# Patient Record
Sex: Female | Born: 1960
Health system: Southern US, Community
[De-identification: ages and names within clinical notes are randomized; demographics above are authoritative.]

## PROBLEM LIST (undated history)

## (undated) DIAGNOSIS — M549 Dorsalgia, unspecified: Secondary | ICD-10-CM

## (undated) DIAGNOSIS — Z5189 Encounter for other specified aftercare: Secondary | ICD-10-CM

## (undated) DIAGNOSIS — E119 Type 2 diabetes mellitus without complications: Secondary | ICD-10-CM

## (undated) DIAGNOSIS — G4730 Sleep apnea, unspecified: Secondary | ICD-10-CM

## (undated) DIAGNOSIS — M5430 Sciatica, unspecified side: Secondary | ICD-10-CM

## (undated) DIAGNOSIS — D649 Anemia, unspecified: Secondary | ICD-10-CM

## (undated) DIAGNOSIS — D126 Benign neoplasm of colon, unspecified: Secondary | ICD-10-CM

## (undated) DIAGNOSIS — I10 Essential (primary) hypertension: Secondary | ICD-10-CM

## (undated) DIAGNOSIS — K219 Gastro-esophageal reflux disease without esophagitis: Secondary | ICD-10-CM

## (undated) DIAGNOSIS — T7840XA Allergy, unspecified, initial encounter: Secondary | ICD-10-CM

## (undated) DIAGNOSIS — M1990 Unspecified osteoarthritis, unspecified site: Secondary | ICD-10-CM

## (undated) DIAGNOSIS — M2550 Pain in unspecified joint: Secondary | ICD-10-CM

## (undated) DIAGNOSIS — D696 Thrombocytopenia, unspecified: Secondary | ICD-10-CM

## (undated) DIAGNOSIS — R7303 Prediabetes: Secondary | ICD-10-CM

## (undated) DIAGNOSIS — R0600 Dyspnea, unspecified: Secondary | ICD-10-CM

## (undated) DIAGNOSIS — R0602 Shortness of breath: Secondary | ICD-10-CM

## (undated) DIAGNOSIS — C801 Malignant (primary) neoplasm, unspecified: Secondary | ICD-10-CM

## (undated) DIAGNOSIS — M069 Rheumatoid arthritis, unspecified: Secondary | ICD-10-CM

## (undated) HISTORY — PX: COLONOSCOPY: SHX174

## (undated) HISTORY — DX: Encounter for other specified aftercare: Z51.89

## (undated) HISTORY — DX: Thrombocytopenia, unspecified: D69.6

## (undated) HISTORY — DX: Prediabetes: R73.03

## (undated) HISTORY — DX: Unspecified osteoarthritis, unspecified site: M19.90

## (undated) HISTORY — PX: TUBAL LIGATION: SHX77

## (undated) HISTORY — DX: Pain in unspecified joint: M25.50

## (undated) HISTORY — DX: Anemia, unspecified: D64.9

## (undated) HISTORY — DX: Gastro-esophageal reflux disease without esophagitis: K21.9

## (undated) HISTORY — DX: Sciatica, unspecified side: M54.30

## (undated) HISTORY — DX: Malignant (primary) neoplasm, unspecified: C80.1

## (undated) HISTORY — DX: Shortness of breath: R06.02

## (undated) HISTORY — DX: Rheumatoid arthritis, unspecified: M06.9

## (undated) HISTORY — DX: Dorsalgia, unspecified: M54.9

## (undated) HISTORY — DX: Allergy, unspecified, initial encounter: T78.40XA

## (undated) HISTORY — PX: STERILIZATION: SHX533

## (undated) HISTORY — DX: Benign neoplasm of colon, unspecified: D12.6

## (undated) HISTORY — PX: POLYPECTOMY: SHX149

## (undated) HISTORY — PX: TYMPANOSTOMY TUBE PLACEMENT: SHX32

## (undated) HISTORY — DX: Type 2 diabetes mellitus without complications: E11.9

## (undated) HISTORY — DX: Sleep apnea, unspecified: G47.30

---

## 2010-08-01 ENCOUNTER — Emergency Department (HOSPITAL_COMMUNITY): Admission: EM | Admit: 2010-08-01 | Discharge: 2010-08-01 | Payer: Self-pay | Admitting: Emergency Medicine

## 2011-01-04 ENCOUNTER — Ambulatory Visit: Payer: Self-pay | Admitting: Family Medicine

## 2011-03-08 ENCOUNTER — Ambulatory Visit: Payer: Self-pay | Admitting: Family Medicine

## 2012-05-16 ENCOUNTER — Encounter (HOSPITAL_COMMUNITY): Payer: Self-pay | Admitting: Emergency Medicine

## 2012-05-16 ENCOUNTER — Emergency Department (HOSPITAL_COMMUNITY)
Admission: EM | Admit: 2012-05-16 | Discharge: 2012-05-16 | Disposition: A | Discharge status: Home / Self Care | Payer: Self-pay | Attending: Emergency Medicine | Admitting: Emergency Medicine

## 2012-05-16 DIAGNOSIS — I10 Essential (primary) hypertension: Secondary | ICD-10-CM | POA: Insufficient documentation

## 2012-05-16 DIAGNOSIS — R42 Dizziness and giddiness: Secondary | ICD-10-CM | POA: Insufficient documentation

## 2012-05-16 HISTORY — DX: Essential (primary) hypertension: I10

## 2012-05-16 MED ORDER — ONDANSETRON 8 MG PO TBDP: 8.0000 mg | ORAL_TABLET | Freq: Three times a day (TID) | ORAL | Status: AC | PRN

## 2012-05-16 MED ORDER — MECLIZINE HCL 50 MG PO TABS: 50.0000 mg | ORAL_TABLET | Freq: Three times a day (TID) | ORAL | Status: AC | PRN

## 2012-05-16 NOTE — ED Notes (Addendum)
Pt c/o of a sudden onset of dizziness this morning when she woke up. Also states she has some nausea. Denies any vomiting or diarrhea. States that both ears hurt.

## 2012-05-16 NOTE — ED Provider Notes (Signed)
History     CSN: [id number]  Arrival date & time 05/16/12  0711   First MD Initiated Contact with Patient 05/16/12 [phone]      Chief Complaint  Patient presents with  . Dizziness    (Consider location/radiation/quality/duration/timing/severity/associated sxs/prior treatment) HPI Patient complaining of dizziness since awakening this a.m.  Patient states stood up and fell into wall.  Worse with movement.  Bilateral ears feel pressure and ringing.  No visual problems or lateralized weakness.  Patient with some vertigo in past but states unable to remember details as it was about 20 years ago.  Patient with nausea when moving but no vomiting.  Patient with some cough for 2-3 days but no fever, sore throat, or rhinnorhea.  Patient with history of hypertension on same med for 8-9 years.  Patient without pmd but has gone to urgent care on [address].   Past Medical History  Diagnosis Date  . Hypertension     Past Surgical History  Procedure Date  . Cesarean section     No family history on file.  History  Substance Use Topics  . Smoking status: Never Smoker   . Smokeless tobacco: Not on file  . Alcohol Use: No    OB History    Grav Para Term Preterm Abortions TAB SAB Ect Mult Living                  Review of Systems  All other systems reviewed and are negative.    Allergies  Review of patient's allergies indicates no known allergies.  Home Medications   Current Outpatient Rx  Name Route Sig Dispense Refill  . IBUPROFEN 200 MG PO TABS Oral Take 200 mg by mouth every 6 (six) hours as needed. Pain    . LISINOPRIL-HYDROCHLOROTHIAZIDE 20-12.5 MG PO TABS Oral Take 1 tablet by mouth daily.      BP 130/83  Pulse 63  Temp 97.8 F (36.6 C) (Oral)  Resp 15  SpO2 100%  Physical Exam  Nursing note and vitals reviewed. Constitutional: She is oriented to person, place, and time. She appears well-developed and well-nourished.  HENT:  Head: Normocephalic and atraumatic.    Eyes: Conjunctivae and EOM are normal. Pupils are equal, round, and reactive to light.  Neck: Normal range of motion. Neck supple.  Cardiovascular: Normal rate, regular rhythm, normal heart sounds and intact distal pulses.   Pulmonary/Chest: Effort normal and breath sounds normal.  Abdominal: Soft. Bowel sounds are normal.  Musculoskeletal: Normal range of motion.  Neurological: She is alert and oriented to person, place, and time. She has normal strength and normal reflexes. She displays a negative Romberg sign. GCS eye subscore is 4. GCS verbal subscore is 5. GCS motor subscore is 6.  Skin: Skin is warm and dry.  Psychiatric: She has a normal mood and affect. Thought content normal.    ED Course  Procedures (including critical care time)  Labs Reviewed - No data to display No results found.   No diagnosis found.  Hall Pike maneuver performed.  Plan antivert and zofran.    MDM  Patient advised regarding precautions and reasons for return.          Hilario Quarry, MD 05/16/12 [phone]

## 2012-05-16 NOTE — Discharge Instructions (Signed)

## 2012-10-29 ENCOUNTER — Encounter: Payer: Self-pay | Admitting: Gastroenterology

## 2012-10-29 ENCOUNTER — Other Ambulatory Visit (HOSPITAL_COMMUNITY)
Admission: RE | Admit: 2012-10-29 | Discharge: 2012-10-29 | Disposition: A | Discharge status: Home / Self Care | Payer: 59 | Source: Ambulatory Visit | Attending: Family Medicine | Admitting: Family Medicine

## 2012-10-29 ENCOUNTER — Encounter: Payer: Self-pay | Admitting: Family Medicine

## 2012-10-29 ENCOUNTER — Ambulatory Visit (INDEPENDENT_AMBULATORY_CARE_PROVIDER_SITE_OTHER): Payer: 59 | Admitting: Family Medicine

## 2012-10-29 ENCOUNTER — Ambulatory Visit (INDEPENDENT_AMBULATORY_CARE_PROVIDER_SITE_OTHER)
Admission: RE | Admit: 2012-10-29 | Discharge: 2012-10-29 | Disposition: A | Discharge status: Home / Self Care | Payer: 59 | Source: Ambulatory Visit | Attending: Family Medicine | Admitting: Family Medicine

## 2012-10-29 VITALS — BP 150/90 | HR 60 | Temp 98.3°F | Ht 63.75 in | Wt 184.0 lb

## 2012-10-29 DIAGNOSIS — M5410 Radiculopathy, site unspecified: Secondary | ICD-10-CM

## 2012-10-29 DIAGNOSIS — I10 Essential (primary) hypertension: Secondary | ICD-10-CM

## 2012-10-29 DIAGNOSIS — IMO0002 Reserved for concepts with insufficient information to code with codable children: Secondary | ICD-10-CM

## 2012-10-29 DIAGNOSIS — Z136 Encounter for screening for cardiovascular disorders: Secondary | ICD-10-CM

## 2012-10-29 DIAGNOSIS — Z01419 Encounter for gynecological examination (general) (routine) without abnormal findings: Secondary | ICD-10-CM | POA: Insufficient documentation

## 2012-10-29 DIAGNOSIS — Z113 Encounter for screening for infections with a predominantly sexual mode of transmission: Secondary | ICD-10-CM

## 2012-10-29 DIAGNOSIS — Z0000 Encounter for general adult medical examination without abnormal findings: Secondary | ICD-10-CM

## 2012-10-29 DIAGNOSIS — Z1151 Encounter for screening for human papillomavirus (HPV): Secondary | ICD-10-CM | POA: Insufficient documentation

## 2012-10-29 DIAGNOSIS — Z1211 Encounter for screening for malignant neoplasm of colon: Secondary | ICD-10-CM

## 2012-10-29 DIAGNOSIS — Z1231 Encounter for screening mammogram for malignant neoplasm of breast: Secondary | ICD-10-CM

## 2012-10-29 LAB — COMPREHENSIVE METABOLIC PANEL
Albumin: 4.1 g/dL (ref 3.5–5.2)
BUN: 14 mg/dL (ref 6–23)
CO2: 28 mEq/L (ref 19–32)
Calcium: 8.9 mg/dL (ref 8.4–10.5)
Chloride: 101 mEq/L (ref 96–112)
Creatinine, Ser: 0.7 mg/dL (ref 0.4–1.2)
GFR: 105.57 mL/min (ref >60.00)
Potassium: 3.6 mEq/L (ref 3.5–5.1)

## 2012-10-29 LAB — LIPID PANEL
HDL: 73.8 mg/dL (ref >39.00)
Triglycerides: 61 mg/dL (ref 0.0–149.0)

## 2012-10-29 MED ORDER — LISINOPRIL-HYDROCHLOROTHIAZIDE 20-12.5 MG PO TABS: 1.0000 | ORAL_TABLET | Freq: Every day | ORAL | Status: DC

## 2012-10-29 MED ORDER — CYCLOBENZAPRINE HCL 10 MG PO TABS: 5.0000 mg | ORAL_TABLET | Freq: Every evening | ORAL | Status: DC | PRN

## 2012-10-29 NOTE — Progress Notes (Signed)
Subjective:    Patient ID: Rachel Rivas, female    DOB: [birth year], 51 y.o.   MRN: [id number]  HPI  51 yo G2P2 here to establish care and for CPX.  HTN- was on lisinopril- HCTZ for years.  Strong family history of HTN. Stopped taking it on her own a few months ago.  BP elevated today.  Denies any HA, blurred vision, CP or SOB. No LE edema.  Back pain- over one year of worsening low back pain, typically right >left with right LE radiculopathy.  Once it was so severe, she felt like her right foot was heavy.  She has not had that sensation in awhile.  No known trauma or injury. 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 Has tried heating pad and Ibuprofen off and on.  Has not had a pap smear in over 3 years.  No h/o abnormal pap smears.  Mom died of uterine CA. She has not had a period in over a year- denies any post menopausal bleeding. Overdue for mammogram. Has never had a colonoscopy.  Patient Active Problem List  Diagnosis  . Hypertension  . Routine general medical examination at a health care facility  . Radicular low back pain   Past Medical History  Diagnosis Date  . Hypertension    Past Surgical History  Procedure Date  . Cesarean section    History  Substance Use Topics  . Smoking status: Former Games developer  . Smokeless tobacco: Not on file  . Alcohol Use: No   Family History  Problem Relation Age of Onset  . Cancer Mother   . Hypertension Father   . Diabetes Father    No Known Allergies Current Outpatient Prescriptions on File Prior to Visit  Medication Sig Dispense Refill  . ibuprofen (ADVIL,MOTRIN) 200 MG tablet Take 200 mg by mouth every 6 (six) hours as needed. Pain       The PMH, PSH, Social History, Family History, Medications, and allergies have been reviewed in [facility], and have been updated if relevant.    Review of Systems See HPI Patient reports no  vision/ hearing changes,anorexia, weight change, fever ,adenopathy, persistant  / recurrent hoarseness, swallowing issues, chest pain, edema,persistant / recurrent cough, hemoptysis, dyspnea(rest, exertional, paroxysmal nocturnal), gastrointestinal  bleeding (melena, rectal bleeding), abdominal pain, excessive heart burn, GU symptoms(dysuria, hematuria, pyuria, voiding/incontinence  Issues) syncope, focal weakness, severe memory loss, concerning skin lesions, depression, anxiety, abnormal bruising/bleeding, major joint swelling, breast masses or abnormal vaginal bleeding.       Objective:   Physical Exam BP 150/90  Pulse 60  Temp 98.3 F (36.8 C)  Ht 5' 3.75" (1.619 m)  Wt 184 lb (83.462 kg)  BMI 31.83 kg/m2  General:  Well-developed,well-nourished,in no acute distress; alert,appropriate and cooperative throughout examination Head:  normocephalic and atraumatic.   Eyes:  vision grossly intact, pupils equal, pupils round, and pupils reactive to light.   Ears:  R ear normal and L ear normal.   Nose:  no external deformity.   Mouth:  good dentition.   Neck:  No deformities, masses, or tenderness noted. Breasts:  No mass, nodules, thickening, tenderness, bulging, retraction, inflamation, nipple discharge or skin changes noted.   Lungs:  Normal respiratory effort, chest expands symmetrically. Lungs are clear to auscultation, no crackles or wheezes. Heart:  Normal rate and regular rhythm. S1 and S2 normal without gallop, murmur, click, rub or other extra sounds. Abdomen:  Bowel sounds positive,abdomen soft and non-tender without masses,  organomegaly or hernias noted. Rectal:  no external abnormalities.   Genitalia:  Pelvic Exam:        External: normal female genitalia without lesions or masses        Vagina: normal without lesions or masses        Cervix: normal without lesions or masses        Adnexa: normal bimanual exam without masses or fullness        Uterus: normal by palpation        Pap smear: performed Msk:   TTP over right lower lumbar paraspinous muscles,  no tenderness over spine. Pos SLR right, neg left. Neg fabers bilaterally Normal strength bilaterally Extremities:  No clubbing, cyanosis, edema, or deformity noted with normal full range of motion of all joints.   Neurologic:  alert & oriented X3 and gait normal.   Skin:  Intact without suspicious lesions or rashes Cervical Nodes:  No lymphadenopathy noted Axillary Nodes:  No palpable lymphadenopathy Psych:  Cognition and judgment appear intact. Alert and cooperative with normal attention span and concentration. No apparent delusions, illusions, hallucinations    Assessment & Plan:   1. HTN Deteriorated.  Restart Lisinopril-HCTZ.  Check CMET. Call me next week with BP readings.     2. Screening for colon cancer  Refer to GI for screening colonoscopy. Ambulatory referral to Gastroenterology  3. Screening for ischemic heart disease  Lipid Panel  4. Routine general medical examination at a health care facility  Reviewed preventive care protocols, scheduled due services, and updated immunizations Discussed nutrition, exercise, diet, and healthy lifestyle.  Comprehensive metabolic panel, Cytology - PAP Mammogram  5. Radicular low back pain  Deteriorated with long duration of symptoms and with radiculopathy. I am concerned for sciatica and given her ? Foot drop at one point- needs imaging.  Start with xray of lumbar spine and will likely proceed with MRI. Flexeril as needed at night, Alleve and or tylenol for pain. DG Lumbar Spine Complete  6. Screening for STD (sexually transmitted disease)  Cytology - PAP

## 2012-10-29 NOTE — Addendum Note (Signed)
Addended by: Dianne Dun on: 10/29/2012 01:09 PM   Modules accepted: Orders

## 2012-10-29 NOTE — Patient Instructions (Addendum)
It was so nice to meet you.  Please stop by to see Shirlee Limerick on your way out to set up your mammogram and colonoscopy.  Aleve and/or tylenol as needed for pain.  We will call you with your xray results and lab work from today.  We may consider MRI or physical therapy (or both) if your back pain continues.   Please call me next week with your blood pressure readings.

## 2012-11-26 ENCOUNTER — Ambulatory Visit
Admission: RE | Admit: 2012-11-26 | Discharge: 2012-11-26 | Disposition: A | Discharge status: Home / Self Care | Payer: 59 | Source: Ambulatory Visit | Attending: Family Medicine | Admitting: Family Medicine

## 2012-11-26 DIAGNOSIS — Z1231 Encounter for screening mammogram for malignant neoplasm of breast: Secondary | ICD-10-CM

## 2012-12-03 ENCOUNTER — Ambulatory Visit: Payer: 59

## 2012-12-07 ENCOUNTER — Encounter: Payer: 59 | Admitting: Gastroenterology

## 2013-03-21 ENCOUNTER — Encounter: Payer: Self-pay | Admitting: Family Medicine

## 2013-03-21 ENCOUNTER — Telehealth: Payer: Self-pay | Admitting: Family Medicine

## 2013-03-21 ENCOUNTER — Ambulatory Visit (INDEPENDENT_AMBULATORY_CARE_PROVIDER_SITE_OTHER): Payer: 59 | Admitting: Family Medicine

## 2013-03-21 VITALS — BP 120/80 | HR 68 | Temp 97.7°F | Wt 180.0 lb

## 2013-03-21 DIAGNOSIS — R002 Palpitations: Secondary | ICD-10-CM

## 2013-03-21 DIAGNOSIS — I10 Essential (primary) hypertension: Secondary | ICD-10-CM

## 2013-03-21 DIAGNOSIS — Z136 Encounter for screening for cardiovascular disorders: Secondary | ICD-10-CM

## 2013-03-21 LAB — TSH: TSH: 0.49 u[IU]/mL (ref 0.35–5.50)

## 2013-03-21 LAB — CBC WITH DIFFERENTIAL/PLATELET
Basophils Absolute: 0 10*3/uL (ref 0.0–0.1)
Eosinophils Relative: 1.5 % (ref 0.0–5.0)
HCT: 33.6 % — ABNORMAL LOW (ref 36.0–46.0)
Lymphocytes Relative: 41.7 % (ref 12.0–46.0)
Lymphs Abs: 2.7 10*3/uL (ref 0.7–4.0)
Monocytes Relative: 8.8 % (ref 3.0–12.0)
Platelets: 63 10*3/uL — ABNORMAL LOW (ref 150.0–400.0)
RDW: 14.3 % (ref 11.5–14.6)
WBC: 6.4 10*3/uL (ref 4.5–10.5)

## 2013-03-21 LAB — COMPREHENSIVE METABOLIC PANEL
ALT: 19 U/L (ref 0–35)
CO2: 30 mEq/L (ref 19–32)
Calcium: 9.4 mg/dL (ref 8.4–10.5)
Chloride: 101 mEq/L (ref 96–112)
Creatinine, Ser: 0.8 mg/dL (ref 0.4–1.2)
GFR: 93.64 mL/min (ref >60.00)
Glucose, Bld: 85 mg/dL (ref 70–99)
Sodium: 134 mEq/L — ABNORMAL LOW (ref 135–145)
Total Bilirubin: 0.7 mg/dL (ref 0.3–1.2)
Total Protein: 7.7 g/dL (ref 6.0–8.3)

## 2013-03-21 LAB — LIPID PANEL
Cholesterol: 162 mg/dL (ref 0–200)
HDL: 77.9 mg/dL (ref >39.00)
Total CHOL/HDL Ratio: 2
Triglycerides: 57 mg/dL (ref 0.0–149.0)

## 2013-03-21 LAB — T4, FREE: Free T4: 0.84 ng/dL (ref 0.60–1.60)

## 2013-03-21 NOTE — Progress Notes (Signed)
Subjective:    Patient ID: Rachel Rivas, female    DOB: 12-08-1960, 52 y.o.   MRN: [id number]  HPI  52 yo pleasant female with h/o HTN here for palpitations x 1 week.  Occuring at rest, typically when she is resting and thinking about the stress she is under. 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  Some stressors will improve once school is out for the summer. She does not feel anxious or depressed.  Episodes typically last less than 15 minutes but this morning seemed to last for hours.  This morning was associated with dizziness but other episodes were not.  Having some chest pressure with it.  No DOE. No LE.  Lab Results  Component Value Date   CHOL 160 10/29/2012   HDL 73.80 10/29/2012   LDLCALC 74 10/29/2012   TRIG 61.0 10/29/2012   CHOLHDL 2 10/29/2012    Patient Active Problem List   Diagnosis Date Noted  . Palpitations 03/21/2013  . Hypertension    Past Medical History  Diagnosis Date  . Hypertension    Past Surgical History  Procedure Laterality Date  . Cesarean section     History  Substance Use Topics  . Smoking status: Former Games developer  . Smokeless tobacco: Not on file  . Alcohol Use: No   Family History  Problem Relation Age of Onset  . Cancer Mother   . Hypertension Father   . Diabetes Father    No Known Allergies Current Outpatient Prescriptions on File Prior to Visit  Medication Sig Dispense Refill  . cyclobenzaprine (FLEXERIL) 10 MG tablet Take 0.5 tablets (5 mg total) by mouth at bedtime as needed for muscle spasms.  30 tablet  0  . ibuprofen (ADVIL,MOTRIN) 200 MG tablet Take 200 mg by mouth every 6 (six) hours as needed. Pain      . lisinopril-hydrochlorothiazide (PRINZIDE,ZESTORETIC) 20-12.5 MG per tablet Take 1 tablet by mouth daily.  30 tablet  11   No current facility-administered medications on file prior to visit.   The PMH, PSH, Social History, Family History, Medications, and allergies have been  reviewed in [facility], and have been updated if relevant.   Review of Systems See HPI    Objective:   Physical Exam BP 120/80  Pulse 68  Temp(Src) 97.7 F (36.5 C)  Wt 180 lb (81.647 kg)  BMI 31.15 kg/m2  LMP 11/17/2012  General:  Well-developed,well-nourished,in no acute distress; alert,appropriate and cooperative throughout examination Head:  normocephalic and atraumatic.   Eyes:  vision grossly intact, pupils equal, pupils round, and pupils reactive to light.   Ears:  R ear normal and L ear normal.   Nose:  no external deformity.   Mouth:  good dentition.   Neck:  No deformities, masses, or tenderness noted. Lungs:  Normal respiratory effort, chest expands symmetrically. Lungs are clear to auscultation, no crackles or wheezes. Heart:  Normal rate and regular rhythm. S1 and S2 normal without gallop, murmur, click, rub or other extra sounds. Msk:  No deformity or scoliosis noted of thoracic or lumbar spine.   Extremities:  No clubbing, cyanosis, edema, or deformity noted with normal full range of motion of all joints.   Neurologic:  alert & oriented X3 and gait normal.   Skin:  Intact without suspicious lesions or rashes Cervical Nodes:  No lymphadenopathy noted Axillary Nodes:  No palpable lymphadenopathy Psych:  Cognition and judgment appear intact. Alert and cooperative with normal attention span and  concentration. No apparent delusions, illusions, hallucinations     Assessment & Plan:  1. Palpitations New- EKG and exam reassuring- NSR. ?PACs, likely related to stress.  Advised decreased caffeine intake. Check labs, refer to cards for holter monitor and further evaluation. She declined tx for anxiety.  - EKG 12-Lead - Comprehensive metabolic panel - CBC with Differential - TSH - T4, Free - Ambulatory referral to Cardiology  2. Hypertension Well controlled on current meds.  3. Screening for ischemic heart disease  - Lipid Panel

## 2013-03-21 NOTE — Patient Instructions (Addendum)
Good to see you.  Take care. We will call you with your lab results tomorrow. Please stop by to see Shirlee Limerick on your way out to set up your referral.  Please go straight to the ER if your symptoms worsen over the weekend.

## 2013-03-21 NOTE — Telephone Encounter (Signed)
At conclusion of call patient reports she has appointment scheduled for 11:00 today.

## 2013-03-21 NOTE — Telephone Encounter (Signed)
Patient Information:  Caller Name: Lasean  Phone: [phone]  Patient: Rachel Rivas  Gender: Female  DOB: Aug 13, 1961  Age: 52 Years  PCP: Ruthe Mannan [facility])  Pregnant: No  Office Follow Up:  Does the office need to follow up with this patient?: Yes  Instructions For The Office: Patient refused ED. Requests to be seen at office today instead. Please follow up.  Thank you.   Symptoms  Reason For Call & Symptoms: Palpitations, heart skipping and shortness of breath.  "I don't like the way it feels".  Chest tightness "like a cold".  Pulse described as fast; caller unsure of pulse rate.  Go to ED Now per Breathting Difficulty protocol due to "Moderate difficutly breathing (e.g., speakins in phrases, SOB even at rest, Pulse 100-120) of new onset or worse than normal.  Reviewed Health History In EMR: Yes  Reviewed Medications In EMR: Yes  Reviewed Allergies In EMR: Yes  Reviewed Surgeries / Procedures: Yes  Date of Onset of Symptoms: 03/07/2013 OB / GYN:  LMP: Unknown  Guideline(s) Used:  Breathing Difficulty  Disposition Per Guideline:   Go to ED Now  Reason For Disposition Reached:   Moderate difficulty breathing (e.g., speaks in phrases, SOB even at rest, pulse 100-120) of new onset or worse than normal  Advice Given:  Call Back If:  Severe difficulty breathing occurs  You become worse.  Patient Refused Recommendation:  Patient Refused Care Advice  Requests appointment at office.

## 2013-04-03 ENCOUNTER — Ambulatory Visit (INDEPENDENT_AMBULATORY_CARE_PROVIDER_SITE_OTHER): Payer: 59 | Admitting: Cardiovascular Disease

## 2013-04-03 ENCOUNTER — Encounter: Payer: Self-pay | Admitting: Cardiovascular Disease

## 2013-04-03 VITALS — BP 112/78 | HR 60 | Ht 63.0 in | Wt 180.5 lb

## 2013-04-03 DIAGNOSIS — R000 Tachycardia, unspecified: Secondary | ICD-10-CM

## 2013-04-03 DIAGNOSIS — R0602 Shortness of breath: Secondary | ICD-10-CM

## 2013-04-03 DIAGNOSIS — R0789 Other chest pain: Secondary | ICD-10-CM | POA: Insufficient documentation

## 2013-04-03 DIAGNOSIS — R002 Palpitations: Secondary | ICD-10-CM

## 2013-04-03 NOTE — Assessment & Plan Note (Signed)
Rachel Rivas presents with some palpitations but clinically sound like premature ventricular contractions. I think that these are benign. I offered to place a monitor on her but at this point I don't think this necessarily needed.  My main concern is of her chest tightness.

## 2013-04-03 NOTE — Progress Notes (Signed)
     Rachel Rivas Date of Birth  1960/12/20       [facility] Office [address]  [address] [place], [place]  [address]   [place], [place]  [address] [phone]     [phone]   Fax  [phone]    Fax [phone]  Problem List: 1. Chest pain 2. Hypertension   History of Present Illness:  Rachel Rivas presents today for further evaluation of a weight on her chest.  These episodes of chest heaviness will last for several minutes.  She has them with rest and activity.  Not associated with eating .   She occasionally has these symptoms while cleaning her house.    She does not get any regular exercise.  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  These seem to be more severe when she is having the chest weight sensation.  Current Outpatient Prescriptions on File Prior to Visit  Medication Sig Dispense Refill  . cyclobenzaprine (FLEXERIL) 10 MG tablet Take 0.5 tablets (5 mg total) by mouth at bedtime as needed for muscle spasms.  30 tablet  0  . ibuprofen (ADVIL,MOTRIN) 200 MG tablet Take 200 mg by mouth every 6 (six) hours as needed. Pain      . lisinopril-hydrochlorothiazide (PRINZIDE,ZESTORETIC) 20-12.5 MG per tablet Take 1 tablet by mouth daily.  30 tablet  11   No current facility-administered medications on file prior to visit.    No Known Allergies  Past Medical History  Diagnosis Date  . Hypertension     Past Surgical History  Procedure Laterality Date  . Cesarean section    . Sterilization      History  Smoking status  . Former Smoker -- 0.25 packs/day for 12 years  . Types: Cigarettes  Smokeless tobacco  . Not on file    History  Alcohol Use  . Yes    Comment: occasional    Family History  Problem Relation Age of Onset  . Cancer Mother   . Hypertension Mother   . Hypertension Father   . Diabetes Father     Reviw of  Systems:  Reviewed in the HPI.  All other systems are negative.  Physical Exam: Blood pressure 112/78, pulse 60, height 5\' 3"  (1.6 m), weight 180 lb 8 oz (81.874 kg), last menstrual period 11/17/2012. General: Well developed, well nourished, in no acute distress.  Head: Normocephalic, atraumatic, sclera non-icteric, mucus membranes are moist,   Neck: Supple. Carotids are 2 + without bruits. No JVD   Lungs: Clear   Heart: RR, normal S1, S2, no significant murmurs.  I was able to hear her bowel sounds very prominantly in her upper chest ( ? Hiatal hernia)   Abdomen: Soft, non-tender, non-distended with normal bowel sounds.  Msk:  Strength and tone are normal   Extremities: No clubbing or cyanosis. No edema.  Distal pedal pulses are 2+ and equal    Neuro: CN II - XII intact.  Alert and oriented X 3.   Psych:  Normal   ECG: Apr 03, 2013:  NSR at 60.  No ST or T wave changes.   Assessment / Plan:

## 2013-04-03 NOTE — Assessment & Plan Note (Signed)
Rachel Rivas presents today with some episodes of chest heaviness. These episodes may represent coronary artery disease. We'll schedule her for a stress Myoview study in the groins were office.  It also is possible that these are due to hiatal hernia. We will have her medical Dr. evaluate her for the possibility of a hiatal hernia as well.  If the stress Myoview study is normal then we will see her back on an as-needed basis. If the stress Myoview study has some abnormalities and will bring her back for further discussion and possible cardiac catheterization if needed.

## 2013-04-03 NOTE — Patient Instructions (Addendum)
You will be scheduled for a stress myoview at our [address] location. Date_________ Time___________  Nothing to eat/drink after midnight You may take your medications as you usually do with a sip of water No caffeine x 24 hours prior to test  Follow up as needed

## 2013-04-08 ENCOUNTER — Ambulatory Visit (HOSPITAL_COMMUNITY): Payer: 59 | Attending: Cardiology | Admitting: Radiology

## 2013-04-08 VITALS — BP 118/75 | HR 52 | Ht 63.0 in | Wt 181.0 lb

## 2013-04-08 DIAGNOSIS — R0789 Other chest pain: Secondary | ICD-10-CM | POA: Insufficient documentation

## 2013-04-08 DIAGNOSIS — R000 Tachycardia, unspecified: Secondary | ICD-10-CM | POA: Insufficient documentation

## 2013-04-08 DIAGNOSIS — R5381 Other malaise: Secondary | ICD-10-CM | POA: Insufficient documentation

## 2013-04-08 DIAGNOSIS — R079 Chest pain, unspecified: Secondary | ICD-10-CM

## 2013-04-08 DIAGNOSIS — R0602 Shortness of breath: Secondary | ICD-10-CM

## 2013-04-08 DIAGNOSIS — I10 Essential (primary) hypertension: Secondary | ICD-10-CM | POA: Insufficient documentation

## 2013-04-08 DIAGNOSIS — R5383 Other fatigue: Secondary | ICD-10-CM | POA: Insufficient documentation

## 2013-04-08 DIAGNOSIS — R002 Palpitations: Secondary | ICD-10-CM | POA: Insufficient documentation

## 2013-04-08 DIAGNOSIS — Z87891 Personal history of nicotine dependence: Secondary | ICD-10-CM | POA: Insufficient documentation

## 2013-04-08 DIAGNOSIS — E663 Overweight: Secondary | ICD-10-CM | POA: Insufficient documentation

## 2013-04-08 DIAGNOSIS — R0989 Other specified symptoms and signs involving the circulatory and respiratory systems: Secondary | ICD-10-CM | POA: Insufficient documentation

## 2013-04-08 DIAGNOSIS — R0609 Other forms of dyspnea: Secondary | ICD-10-CM | POA: Insufficient documentation

## 2013-04-08 MED ORDER — TECHNETIUM TC 99M SESTAMIBI GENERIC - CARDIOLITE
11.0000 | Freq: Once | INTRAVENOUS | Status: AC | PRN
  Administered 2013-04-08: 11 via INTRAVENOUS

## 2013-04-08 MED ORDER — TECHNETIUM TC 99M SESTAMIBI GENERIC - CARDIOLITE
33.0000 | Freq: Once | INTRAVENOUS | Status: AC | PRN
  Administered 2013-04-08: 33 via INTRAVENOUS

## 2013-04-08 NOTE — Progress Notes (Signed)
[facility] SITE 3 NUCLEAR MED [address] [place], [place] [address] [phone]    Cardiology Nuclear Med Study  Rachel Rivas is a 52 y.o. female     MRN : [id number]     DOB: [birth year]  Procedure Date: 04/08/2013  Nuclear Med Background Indication for Stress Test:  Evaluation for Ischemia History:  No previously documented CAD Cardiac Risk Factors: History of Smoking, Hypertension and Overweight  Symptoms:  Chest Pressure with and without Exertion (last episode of chest discomfort is now, 5/10; CP went away prior to exercise), DOE, Fatigue, Palpitations, Rapid HR and SOB   Nuclear Pre-Procedure Caffeine/Decaff Intake:  None NPO After: 11:00pm   Lungs:  Clear. O2 Sat: 95% on room air. IV 0.9% NS with Angio Cath:  20g  IV Site: R Antecubital  IV Started by:  Stanton Kidney, EMT-P  Chest Size (in):  38 Cup Size: D  Height: 5\' 3"  (1.6 m)  Weight:  181 lb (82.101 kg)  BMI:  Body mass index is 32.07 kg/(m^2). Tech Comments:  n/a    Nuclear Med Study 1 or 2 day study: 1 day  Stress Test Type:  Stress  Reading MD: Willa Rough, MD  Order Authorizing Provider:  Kristeen Miss, MD  Resting Radionuclide: Technetium 35m Sestamibi  Resting Radionuclide Dose: 11.0 mCi   Stress Radionuclide:  Technetium 65m Sestamibi  Stress Radionuclide Dose: 33.0 mCi           Stress Protocol Rest HR: 52 Stress HR: 151  Rest BP: 118/75 Stress BP: 184/80  Exercise Time (min): 6:15 METS: 7.0   Predicted Max HR: 167 bpm % Max HR: 90.42 bpm Rate Pressure Product: 21308   Dose of Adenosine (mg):  n/a Dose of Lexiscan: n/a mg  Dose of Atropine (mg): n/a Dose of Dobutamine: n/a mcg/kg/min (at max HR)  Stress Test Technologist: Smiley Houseman, CMA-N  Nuclear Technologist:  Domenic Polite, CNMT     Rest Procedure:  Myocardial perfusion imaging was performed at rest 45 minutes following the intravenous administration of Technetium 25m Sestamibi.  Rest ECG: there is normal sinus rhythm  with very mild nonspecific ST scooping  Stress Procedure:  The patient exercised on the treadmill utilizing the Bruce Protocol for 6:15 minutes. The patient stopped due to chest pressure, 10/10, per patient.  She was given NTG 0.4 mg SL in recovery with relief.   Technetium 55m Sestamibi was injected at peak exercise and myocardial perfusion imaging was performed after a brief delay.  Stress ECG: No significant change from baseline ECG  QPS Raw Data Images:  Patient motion noted; appropriate software correction applied. Stress Images:  Normal homogeneous uptake in all areas of the myocardium. Rest Images:  There is interference from nuclear activity from structures below the diaphragm. This does not affect the ability to read the study. There are no areas of significant abnormality on the rest image Subtraction (SDS):  No evidence of ischemia. Transient Ischemic Dilatation (Normal <1.22):  1.19 Lung/Heart Ratio (Normal <0.45):  0.21  Quantitative Gated Spect Images QGS EDV:  81 ml QGS ESV:  25 ml  Impression Exercise Capacity:  The patient had only fair exercise capacity. BP Response:  Normal blood pressure response. Clinical Symptoms:  Early in stress the patient had some chest pressure. This persisted into recovery. She was given a nitroglycerin and at that time had decreased blood pressure. She was put in the Trendelenburg position and then stabilized. At the end she had a mild residual headache.  ECG Impression:  The resting EKG reveals mild nonspecific ST scooping. This becomes somewhat worse with stress but it is not diagnostic. It normalizes early in the recovery period. Comparison with Prior Nuclear Study: No images to compare  Overall Impression:  The patient had a lot of symptoms during the study. She received a nitroglycerin and then had some decreased blood pressure. The EKGs are nonspecific. The nuclear images are normal. There is no scar or ischemia. This is a low risk  scan.  LV Ejection Fraction: 69%.  LV Wall Motion:  Normal Wall Motion.  Willa Rough, MD

## 2013-04-09 ENCOUNTER — Encounter (HOSPITAL_COMMUNITY): Payer: 59

## 2013-06-27 ENCOUNTER — Telehealth: Payer: Self-pay

## 2013-06-27 NOTE — Telephone Encounter (Signed)
Pt left v/m requesting Ibuprofen 800 mg sent to [facility] on [address] for back pain; pt thought she asked Dr Dayton Martes previously for Ibuprofen 800 mg but not authorized at pharmacy.Please advise.

## 2013-06-28 NOTE — Telephone Encounter (Signed)
I don't see this in the EMR. Given her other BP medicine, I wouldn't take more than 600mg  tid for now with food.  Use the OTC med for now and I'll defer to PCP in meantime.

## 2013-06-28 NOTE — Telephone Encounter (Signed)
Patient advised.

## 2013-07-08 ENCOUNTER — Telehealth: Payer: Self-pay | Admitting: Family Medicine

## 2013-07-08 NOTE — Telephone Encounter (Signed)
Patient asked to switch from Dr.Aron to you.  You see her daughter, Ahsha Hinsley.  Patient said she let her daughter take her new patient appointment with you and when she called back to schedule a new patient appointment with you, you had stopped seeing new patients.  She found out that you're now seeing new patients and she would like to switch to you.  Please advise.

## 2013-07-09 ENCOUNTER — Telehealth: Payer: Self-pay

## 2013-07-09 MED ORDER — IBUPROFEN 200 MG PO TABS: 800.0000 mg | ORAL_TABLET | Freq: Three times a day (TID) | ORAL | Status: DC | PRN

## 2013-07-09 NOTE — Telephone Encounter (Signed)
Pt left v/m not sure if has refills on BP med, lisinopril hctz and request refill Motrin 800 mg. Do not see Motrin 800 mg on med list and pt should have refills lisinopril hctz at [facility]. Left v/m advising pt of this info and advised to cb if needed.

## 2013-07-09 NOTE — Telephone Encounter (Signed)
Please get more information.  What is she requesting this for and how often is she using it?

## 2013-07-09 NOTE — Telephone Encounter (Signed)
Spoke with patient. She says she has taken this for years, takes as needed for pain- such as her back.  I suggested she take this over the counter but she said she would rather take one pill at a time, instead of several.  Please advise.

## 2013-07-09 NOTE — Telephone Encounter (Signed)
Rx sent.  Please make sure she is taking with food and only as needed as it can cause both GI and kidney issues if used improperly.

## 2013-07-09 NOTE — Telephone Encounter (Signed)
Pt request Motrin 800 mg rx with instructions to take as needed. [facility] [address].Please advise.

## 2013-07-09 NOTE — Telephone Encounter (Signed)
Left message on voice mail asking patient to call back. 

## 2013-07-09 NOTE — Telephone Encounter (Signed)
Left message on cell phone voice mail advising patient.

## 2013-07-09 NOTE — Telephone Encounter (Signed)
It sounds appropriate to switch MDs.

## 2013-07-17 ENCOUNTER — Telehealth: Payer: Self-pay | Admitting: *Deleted

## 2013-07-17 NOTE — Telephone Encounter (Signed)
Okay to send as requested 

## 2013-07-17 NOTE — Telephone Encounter (Signed)
Pt has left a wellness form for her insurance that she needs completed and mailed back to her.  They want copies of last lab results, last office note, mammogram, pap. Ok to print off and send these things?

## 2013-07-18 NOTE — Telephone Encounter (Signed)
Left message asking pt if she wants to pick this up, too many papers to mail.

## 2013-07-24 NOTE — Telephone Encounter (Signed)
Spoke with patient, she will pick up forms at front desk.

## 2013-08-13 ENCOUNTER — Encounter: Payer: 59 | Admitting: Family Medicine

## 2013-11-01 ENCOUNTER — Encounter: Payer: Self-pay | Admitting: Internal Medicine

## 2013-11-01 ENCOUNTER — Ambulatory Visit (INDEPENDENT_AMBULATORY_CARE_PROVIDER_SITE_OTHER): Payer: 59 | Admitting: Family Medicine

## 2013-11-01 ENCOUNTER — Encounter: Payer: Self-pay | Admitting: Family Medicine

## 2013-11-01 ENCOUNTER — Other Ambulatory Visit (HOSPITAL_COMMUNITY)
Admission: RE | Admit: 2013-11-01 | Discharge: 2013-11-01 | Disposition: A | Discharge status: Home / Self Care | Payer: 59 | Source: Ambulatory Visit | Attending: Family Medicine | Admitting: Family Medicine

## 2013-11-01 VITALS — BP 110/70 | HR 69 | Temp 98.1°F | Ht 64.0 in | Wt 174.5 lb

## 2013-11-01 DIAGNOSIS — Z01419 Encounter for gynecological examination (general) (routine) without abnormal findings: Secondary | ICD-10-CM | POA: Insufficient documentation

## 2013-11-01 DIAGNOSIS — Z124 Encounter for screening for malignant neoplasm of cervix: Secondary | ICD-10-CM

## 2013-11-01 DIAGNOSIS — Z1212 Encounter for screening for malignant neoplasm of rectum: Secondary | ICD-10-CM

## 2013-11-01 DIAGNOSIS — Z0000 Encounter for general adult medical examination without abnormal findings: Secondary | ICD-10-CM

## 2013-11-01 DIAGNOSIS — I10 Essential (primary) hypertension: Secondary | ICD-10-CM

## 2013-11-01 MED ORDER — LISINOPRIL 20 MG PO TABS: 20.0000 mg | ORAL_TABLET | Freq: Every day | ORAL | Status: DC

## 2013-11-01 NOTE — Progress Notes (Signed)
Subjective:    Patient ID: Rachel Rivas, female    DOB: 06/12/1961, 52 y.o.   MRN: [id number]  HPI  52 year old pt of Dr. Elmer Sow who presents for annual wellness visit.   Hypertension: BP well controlled on  Losartan HCTZ. Using medication without problems or lightheadedness: None Chest pain with exertion:None Edema:None Short of breath:None Average home BPs:100-110/70 Other issues:  Less stress than earlier in the year... No further chest tightness or palpitations.  She has bee eating healthy and regular exercise daily at [facility]. Has lost 10 lbs in last year. Has cut out soda and fried foods. Increase water. 2L.  Wt Readings from Last 3 Encounters:  11/01/13 174 lb 8 oz (79.153 kg)  04/08/13 181 lb (82.101 kg)  04/03/13 180 lb 8 oz (81.874 kg)    Lab Results  Component Value Date   CHOL 162 03/21/2013   HDL 77.90 03/21/2013   LDLCALC 73 03/21/2013   TRIG 57.0 03/21/2013   CHOLHDL 2 03/21/2013       Review of Systems  Constitutional: Negative for fever and fatigue.  HENT: Negative for ear pain.   Eyes: Negative for pain.  Respiratory: Negative for chest tightness and shortness of breath.   Cardiovascular: Negative for chest pain, palpitations and leg swelling.  Gastrointestinal: Negative for abdominal pain.  Genitourinary: Negative for dysuria.       Objective:   Physical Exam  Constitutional: Vital signs are normal. She appears well-developed and well-nourished. She is cooperative.  Non-toxic appearance. She does not appear ill. No distress.  HENT:  Head: Normocephalic.  Right Ear: Hearing, tympanic membrane, external ear and ear canal normal. Tympanic membrane is not erythematous, not retracted and not bulging.  Left Ear: Hearing, tympanic membrane, external ear and ear canal normal. Tympanic membrane is not erythematous, not retracted and not bulging.  Nose: Nose normal. No mucosal edema or rhinorrhea. Right sinus exhibits no maxillary sinus tenderness and no  frontal sinus tenderness. Left sinus exhibits no maxillary sinus tenderness and no frontal sinus tenderness.  Mouth/Throat: Uvula is midline, oropharynx is clear and moist and mucous membranes are normal.  Eyes: Conjunctivae, EOM and lids are normal. Pupils are equal, round, and reactive to light. Lids are everted and swept, no foreign bodies found.  Neck: Trachea normal and normal range of motion. Neck supple. Carotid bruit is not present. No mass and no thyromegaly present.  Cardiovascular: Normal rate, regular rhythm, S1 normal, S2 normal, normal heart sounds, intact distal pulses and normal pulses.  Exam reveals no gallop and no friction rub.   No murmur heard. Pulmonary/Chest: Effort normal and breath sounds normal. Not tachypneic. No respiratory distress. She has no decreased breath sounds. She has no wheezes. She has no rhonchi. She has no rales.  Abdominal: Soft. Normal appearance and bowel sounds are normal. She exhibits no distension, no fluid wave, no abdominal bruit and no mass. There is no hepatosplenomegaly. There is no tenderness. There is no rebound, no guarding and no CVA tenderness. No hernia.  Genitourinary: Vagina normal and uterus normal. No breast swelling, tenderness, discharge or bleeding. Pelvic exam was performed with patient prone. There is no rash, tenderness or lesion on the right labia. There is no rash, tenderness or lesion on the left labia. Uterus is not enlarged and not tender. Cervix exhibits no motion tenderness, no discharge and no friability. Right adnexum displays no mass, no tenderness and no fullness. Left adnexum displays no mass, no tenderness and no fullness.  Lymphadenopathy:    She has no cervical adenopathy.    She has no axillary adenopathy.  Neurological: She is alert. She has normal strength. No cranial nerve deficit or sensory deficit.  Skin: Skin is warm, dry and intact. No rash noted.  Psychiatric: Her speech is normal and behavior is normal.  Judgment and thought content normal. Her mood appears not anxious. Cognition and memory are normal. She does not exhibit a depressed mood.          Assessment & Plan:  The patient's preventative maintenance and recommended screening tests for an annual wellness exam were reviewed in full today. Brought up to date unless services declined.  Counselled on the importance of diet, exercise, and its role in overall health and mortality. The patient's FH and SH was reviewed, including their home life, tobacco status, and drug and alcohol status.   Mammo: nml 10/2012, due now  Colon:  Never had colonoscopy, no family hx of colon cancer.  She is interested in referral to stool test. Former smoker minimal use, 3 pack years Vaccines: flu refused and tdap 2010  2013 nml pap, will do 2 more in a row then space, yearly DVE.

## 2013-11-01 NOTE — Assessment & Plan Note (Signed)
Improved control with weight loss. Will decrease to lisinopril alone and try to decrease further if able.

## 2013-11-01 NOTE — Patient Instructions (Addendum)
Decrease BP medication to lisinopril alone daily.  Follow BP daily ... Call with measurements after 1-2 week to see if we can decrease further.  Call to schedule yearly mammogram at [facility] [place]. Stop at the front desk for referral to GI for colonoscopy.

## 2013-11-01 NOTE — Progress Notes (Signed)
Pre-visit discussion using our clinic review tool. No additional management support is needed unless otherwise documented below in the visit note.  

## 2013-11-05 ENCOUNTER — Encounter: Payer: Self-pay | Admitting: *Deleted

## 2013-11-06 ENCOUNTER — Other Ambulatory Visit: Payer: Self-pay | Admitting: Family Medicine

## 2013-11-06 DIAGNOSIS — Z1231 Encounter for screening mammogram for malignant neoplasm of breast: Secondary | ICD-10-CM

## 2013-11-27 ENCOUNTER — Ambulatory Visit (HOSPITAL_COMMUNITY)
Admission: RE | Admit: 2013-11-27 | Discharge: 2013-11-27 | Disposition: A | Discharge status: Home / Self Care | Payer: 59 | Source: Ambulatory Visit | Attending: Family Medicine | Admitting: Family Medicine

## 2013-11-27 DIAGNOSIS — Z1231 Encounter for screening mammogram for malignant neoplasm of breast: Secondary | ICD-10-CM

## 2014-01-17 ENCOUNTER — Encounter: Payer: 59 | Admitting: Internal Medicine

## 2014-02-12 ENCOUNTER — Other Ambulatory Visit: Payer: Self-pay | Admitting: *Deleted

## 2014-02-12 MED ORDER — LISINOPRIL 20 MG PO TABS: 20.0000 mg | ORAL_TABLET | Freq: Every day | ORAL | Status: DC

## 2014-03-07 ENCOUNTER — Encounter: Payer: 59 | Admitting: Internal Medicine

## 2014-04-08 ENCOUNTER — Encounter: Payer: Self-pay | Admitting: *Deleted

## 2014-04-08 ENCOUNTER — Ambulatory Visit (INDEPENDENT_AMBULATORY_CARE_PROVIDER_SITE_OTHER): Payer: 59 | Admitting: Family Medicine

## 2014-04-08 ENCOUNTER — Telehealth: Payer: Self-pay | Admitting: Family Medicine

## 2014-04-08 ENCOUNTER — Encounter: Payer: Self-pay | Admitting: Family Medicine

## 2014-04-08 ENCOUNTER — Ambulatory Visit (INDEPENDENT_AMBULATORY_CARE_PROVIDER_SITE_OTHER)
Admission: RE | Admit: 2014-04-08 | Discharge: 2014-04-08 | Disposition: A | Discharge status: Home / Self Care | Payer: 59 | Source: Ambulatory Visit | Attending: Family Medicine | Admitting: Family Medicine

## 2014-04-08 VITALS — BP 112/78 | HR 65 | Temp 97.9°F | Ht 64.0 in | Wt 181.0 lb

## 2014-04-08 DIAGNOSIS — M25532 Pain in left wrist: Principal | ICD-10-CM

## 2014-04-08 DIAGNOSIS — M722 Plantar fascial fibromatosis: Secondary | ICD-10-CM

## 2014-04-08 DIAGNOSIS — M25531 Pain in right wrist: Secondary | ICD-10-CM

## 2014-04-08 DIAGNOSIS — M25512 Pain in left shoulder: Secondary | ICD-10-CM | POA: Insufficient documentation

## 2014-04-08 DIAGNOSIS — M25539 Pain in unspecified wrist: Secondary | ICD-10-CM

## 2014-04-08 DIAGNOSIS — I10 Essential (primary) hypertension: Secondary | ICD-10-CM

## 2014-04-08 DIAGNOSIS — M25519 Pain in unspecified shoulder: Secondary | ICD-10-CM

## 2014-04-08 DIAGNOSIS — M25511 Pain in right shoulder: Secondary | ICD-10-CM | POA: Insufficient documentation

## 2014-04-08 LAB — RHEUMATOID FACTOR: RHEUMATOID FACTOR: 69 [IU]/mL — AB (ref <=14)

## 2014-04-08 LAB — SEDIMENTATION RATE: SED RATE: 21 mm/h (ref 0–22)

## 2014-04-08 MED ORDER — DICLOFENAC SODIUM 75 MG PO TBEC
75.0000 mg | DELAYED_RELEASE_TABLET | Freq: Two times a day (BID) | ORAL | Status: DC
Start: 2014-04-08 — End: 2014-04-29

## 2014-04-08 MED ORDER — DICLOFENAC SODIUM 75 MG PO TBEC: 75.0000 mg | DELAYED_RELEASE_TABLET | Freq: Two times a day (BID) | ORAL | Status: DC

## 2014-04-08 NOTE — Telephone Encounter (Signed)
Relevant patient education assigned to patient using Emmi. ° °

## 2014-04-08 NOTE — Assessment & Plan Note (Signed)
BP well controlled. Even off med today. Okay to hold med.

## 2014-04-08 NOTE — Assessment & Plan Note (Signed)
Likely rotator cuff... If not improving with NSAID and home PT... Consider referral for steroid injection.

## 2014-04-08 NOTE — Patient Instructions (Addendum)
Okay to hold lisinopril. Follow BP at home.. Goal < 140/90.   We will call with X-ray results.  Stop at lab on way out.

## 2014-04-08 NOTE — Assessment & Plan Note (Signed)
Treat with ice massage, NSAID. Info given.

## 2014-04-08 NOTE — Assessment & Plan Note (Signed)
?   Tendonitis but given swelling and B pain... Will eval with labs for autoimmune disease.

## 2014-04-08 NOTE — Progress Notes (Signed)
Pre visit review using our clinic review tool, if applicable. No additional management support is needed unless otherwise documented below in the visit note. 

## 2014-04-08 NOTE — Progress Notes (Signed)
Subjective:    Patient ID: Rachel Rivas, female    DOB: [birth year], 53 y.o.   MRN: [id number]  Shoulder Pain  Pertinent negatives include no fever.  Foot Pain Pertinent negatives include no abdominal pain, chest pain, fatigue or fever.    53 year old female pt presents with new onset of several issues.  1. Swelling and stiffness in hands, also pain in  B wrists. Ongoing for 2 weeks.  No redness.  2. Right shoulder pain: ongoing x 1 month. Pain with  Shoulder abduction, internal rotation, no pain at rest. No neck pain. No pain with movement of neck.  Seen at urgent care 3 weeks ago, awoke at night with pain. Dx with bursitis.  Treated with naproxen, helped minimally. No change in activity or injury prior to pain.   3.Foot pain, right: heel pain when getting up out of bed and starting walking. No swelling in ankles. No treatment tried. Improved with walking and stretching  No family  History of first degree relative with RA.  No personal history of autoimmune disease. Has dx of carpal tunnel in  B hands.   BP well controlled . She would like to try coming off BP med.  Review of Systems  Constitutional: Negative for fever and fatigue.  HENT: Negative for ear pain.   Eyes: Negative for pain.  Respiratory: Negative for chest tightness and shortness of breath.   Cardiovascular: Negative for chest pain, palpitations and leg swelling.  Gastrointestinal: Negative for abdominal pain.  Genitourinary: Negative for dysuria.       Objective:   Physical Exam  Constitutional: Vital signs are normal. She appears well-developed and well-nourished. She is cooperative.  Non-toxic appearance. She does not appear ill. No distress.  HENT:  Head: Normocephalic.  Right Ear: Hearing, tympanic membrane, external ear and ear canal normal. Tympanic membrane is not erythematous, not retracted and not bulging.  Left Ear: Hearing, tympanic membrane, external ear and ear canal normal. Tympanic membrane is  not erythematous, not retracted and not bulging.  Nose: No mucosal edema or rhinorrhea. Right sinus exhibits no maxillary sinus tenderness and no frontal sinus tenderness. Left sinus exhibits no maxillary sinus tenderness and no frontal sinus tenderness.  Mouth/Throat: Uvula is midline, oropharynx is clear and moist and mucous membranes are normal.  Eyes: Conjunctivae, EOM and lids are normal. Pupils are equal, round, and reactive to light. Lids are everted and swept, no foreign bodies found.  Neck: Trachea normal and normal range of motion. Neck supple. Carotid bruit is not present. No mass and no thyromegaly present.  Cardiovascular: Normal rate, regular rhythm, S1 normal, S2 normal, normal heart sounds, intact distal pulses and normal pulses.  Exam reveals no gallop and no friction rub.   No murmur heard. Pulmonary/Chest: Effort normal and breath sounds normal. Not tachypneic. No respiratory distress. She has no decreased breath sounds. She has no wheezes. She has no rhonchi. She has no rales.  Abdominal: Soft. Normal appearance and bowel sounds are normal. There is no tenderness.  Musculoskeletal:       Right shoulder: She exhibits decreased range of motion and tenderness.       Right wrist: She exhibits decreased range of motion, tenderness, bony tenderness and swelling.       Left wrist: She exhibits decreased range of motion, tenderness and bony tenderness.       Right ankle: Normal.       Right foot: She exhibits tenderness. She exhibits normal range of motion,  no bony tenderness and no swelling.  Pain in right shoulder with abduction and int and ext rotation, postitive neer's  No ttp in neck, neg Spurling  Very ttp ulnar aspect of right wrist, mild swelling B wrist and hands, no redness, pain with supination and pronation of wrist  ttp at insertion of plantar fascia in right heel, neg squeeze test.  Neurological: She is alert.  Skin: Skin is warm, dry and intact. No rash noted.    Psychiatric: Her speech is normal and behavior is normal. Judgment and thought content normal. Her mood appears not anxious. Cognition and memory are normal. She does not exhibit a depressed mood.          Assessment & Plan:

## 2014-04-09 LAB — ANTI-NUCLEAR AB-TITER (ANA TITER): ANA Titer 1: 1:40 {titer} — ABNORMAL HIGH

## 2014-04-09 LAB — ANA: Anti Nuclear Antibody(ANA): POSITIVE — AB

## 2014-04-09 LAB — CYCLIC CITRUL PEPTIDE ANTIBODY, IGG: Cyclic Citrullin Peptide Ab: >300 U/mL — ABNORMAL HIGH (ref 0.0–5.0)

## 2014-04-10 ENCOUNTER — Telehealth: Payer: Self-pay | Admitting: Family Medicine

## 2014-04-10 DIAGNOSIS — M25531 Pain in right wrist: Secondary | ICD-10-CM

## 2014-04-10 DIAGNOSIS — R768 Other specified abnormal immunological findings in serum: Secondary | ICD-10-CM

## 2014-04-10 DIAGNOSIS — M25532 Pain in left wrist: Principal | ICD-10-CM

## 2014-04-10 NOTE — Telephone Encounter (Signed)
Message copied by [name] on Thu Apr 10, 2014 11:09 AM ------      Message from: Carter Kitten      Created: Thu Apr 10, 2014 10:58 AM       Levada Dy notified as instructed by telephone.  Would like to move forward with rheumatology referral. ------

## 2014-04-22 ENCOUNTER — Ambulatory Visit: Payer: 59 | Admitting: Family Medicine

## 2014-04-29 ENCOUNTER — Other Ambulatory Visit: Payer: Self-pay | Admitting: *Deleted

## 2014-04-29 MED ORDER — DICLOFENAC SODIUM 75 MG PO TBEC: 75.0000 mg | DELAYED_RELEASE_TABLET | Freq: Two times a day (BID) | ORAL | Status: DC

## 2014-04-29 NOTE — Telephone Encounter (Signed)
Rachel Rivas called in requesting refill on her anti-inflammatory.  She states Dr. Diona Browner referred her to a specialist but she isn't scheduled to see them until June 21.  Refill for Diclofenac sent to [facility] on [address].

## 2014-05-26 ENCOUNTER — Encounter: Payer: Self-pay | Admitting: Internal Medicine

## 2014-05-26 ENCOUNTER — Ambulatory Visit (INDEPENDENT_AMBULATORY_CARE_PROVIDER_SITE_OTHER): Payer: 59 | Admitting: Internal Medicine

## 2014-05-26 VITALS — BP 110/68 | HR 71 | Temp 98.4°F | Wt 185.0 lb

## 2014-05-26 DIAGNOSIS — J019 Acute sinusitis, unspecified: Secondary | ICD-10-CM | POA: Insufficient documentation

## 2014-05-26 DIAGNOSIS — J018 Other acute sinusitis: Secondary | ICD-10-CM

## 2014-05-26 MED ORDER — AMOXICILLIN 500 MG PO TABS: 1000.0000 mg | ORAL_TABLET | Freq: Two times a day (BID) | ORAL | Status: DC

## 2014-05-26 NOTE — Assessment & Plan Note (Signed)
Has 1 month of congestion, slight sore throat, drainage (more recently) and cough Ear pain but no ear findings Unclear picture but doesn't seem to be related to recent RA diagnosis Will treat with amoxil for now

## 2014-05-26 NOTE — Progress Notes (Signed)
Pre visit review using our clinic review tool, if applicable. No additional management support is needed unless otherwise documented below in the visit note. 

## 2014-05-26 NOTE — Progress Notes (Signed)
   Subjective:    Patient ID: Rachel Rivas, female    DOB: Nov 13, 1961, 53 y.o.   MRN: [id number]  HPI Has a right earache First noticed a month ago, now more constant Mild sore throat Has bad headache--thinks it is coming from ears. Left is now bothering her Feels her balance is off--some vertigo  No fever Some sinus problems in past--mostly in fall Restarted her sinus tablets due to head pain (tylenol sinus) Some post nasal drip and cough  Current Outpatient Prescriptions on File Prior to Visit  Medication Sig Dispense Refill  . diclofenac (VOLTAREN) 75 MG EC tablet Take 1 tablet (75 mg total) by mouth 2 (two) times daily.  60 tablet  0  . Multiple Vitamins-Minerals (MULTIVITAMIN PO) Take by mouth daily.       No current facility-administered medications on file prior to visit.    No Known Allergies  Past Medical History  Diagnosis Date  . Hypertension     Past Surgical History  Procedure Laterality Date  . Cesarean section    . Sterilization      Family History  Problem Relation Age of Onset  . Cancer Mother   . Hypertension Mother   . Hypertension Father   . Diabetes Father     History   Social History  . Marital Status: Married    Spouse Name: N/A    Number of Children: N/A  . Years of Education: N/A   Occupational History  . Not on file.   Social History Main Topics  . Smoking status: Former Smoker -- 0.25 packs/day for 12 years    Types: Cigarettes  . Smokeless tobacco: Never Used  . Alcohol Use: Yes     Comment: occasional  . Drug Use: No     Comment: past; marijuana  . Sexual Activity: Not on file   Other Topics Concern  . Not on file   Social History Narrative   Moved here from [place] two years ago.      Works for [employer].            Review of Systems On prednisone now for RA---will be start methotrexate at 15mg  weekly Appetite is okay Hearing is okay--but notices an echo or ear "grinding" No tinnitus      Objective:   Physical Exam  Constitutional: She appears well-developed and well-nourished. No distress.  HENT:  Mild maxillary tenderness Slight TMJ tenderness--mostly on right No tragal tenderness Canals and TMs look normal bilaterally No temporal tenderness or jaw claudication Moderate nasal inflammation  Neck: Normal range of motion. Neck supple.  Slight anterior cervical nodes--non tender  Pulmonary/Chest: Effort normal and breath sounds normal. No respiratory distress. She has no wheezes. She has no rales.          Assessment & Plan:

## 2014-10-28 ENCOUNTER — Other Ambulatory Visit (INDEPENDENT_AMBULATORY_CARE_PROVIDER_SITE_OTHER): Payer: 59

## 2014-10-28 ENCOUNTER — Telehealth: Payer: Self-pay | Admitting: Family Medicine

## 2014-10-28 DIAGNOSIS — I10 Essential (primary) hypertension: Secondary | ICD-10-CM

## 2014-10-28 LAB — COMPREHENSIVE METABOLIC PANEL
ALT: 21 U/L (ref 0–35)
AST: 24 U/L (ref 0–37)
Albumin: 4.1 g/dL (ref 3.5–5.2)
Alkaline Phosphatase: 71 U/L (ref 39–117)
BUN: 18 mg/dL (ref 6–23)
CALCIUM: 9.2 mg/dL (ref 8.4–10.5)
CHLORIDE: 104 meq/L (ref 96–112)
CO2: 28 mEq/L (ref 19–32)
CREATININE: 0.8 mg/dL (ref 0.4–1.2)
GFR: 90.52 mL/min (ref >60.00)
Glucose, Bld: 110 mg/dL — ABNORMAL HIGH (ref 70–99)
POTASSIUM: 4.1 meq/L (ref 3.5–5.1)
SODIUM: 139 meq/L (ref 135–145)
Total Bilirubin: 0.7 mg/dL (ref 0.2–1.2)
Total Protein: 7.6 g/dL (ref 6.0–8.3)

## 2014-10-28 LAB — LIPID PANEL
CHOL/HDL RATIO: 2
Cholesterol: 158 mg/dL (ref 0–200)
HDL: 72.6 mg/dL (ref >39.00)
LDL Cholesterol: 74 mg/dL (ref 0–99)
NONHDL: 85.4
Triglycerides: 55 mg/dL (ref 0.0–149.0)
VLDL: 11 mg/dL (ref 0.0–40.0)

## 2014-10-28 NOTE — Telephone Encounter (Signed)
-----   Message from Ellamae Sia sent at 10/22/2014 12:14 PM EST ----- Regarding: Lab orders for Tuesday, 12.8.15 Patient is scheduled for CPX labs, please order future labs, Thanks , Karna Christmas

## 2014-11-04 ENCOUNTER — Encounter: Payer: Self-pay | Admitting: Family Medicine

## 2014-11-04 ENCOUNTER — Ambulatory Visit (INDEPENDENT_AMBULATORY_CARE_PROVIDER_SITE_OTHER): Payer: 59 | Admitting: Family Medicine

## 2014-11-04 ENCOUNTER — Other Ambulatory Visit (HOSPITAL_COMMUNITY)
Admission: RE | Admit: 2014-11-04 | Discharge: 2014-11-04 | Disposition: A | Discharge status: Home / Self Care | Payer: 59 | Source: Ambulatory Visit | Attending: Family Medicine | Admitting: Family Medicine

## 2014-11-04 VITALS — BP 150/92 | HR 72 | Temp 98.3°F | Ht 63.0 in | Wt 195.2 lb

## 2014-11-04 DIAGNOSIS — R0683 Snoring: Secondary | ICD-10-CM | POA: Insufficient documentation

## 2014-11-04 DIAGNOSIS — Z124 Encounter for screening for malignant neoplasm of cervix: Secondary | ICD-10-CM

## 2014-11-04 DIAGNOSIS — I10 Essential (primary) hypertension: Secondary | ICD-10-CM

## 2014-11-04 DIAGNOSIS — R7303 Prediabetes: Secondary | ICD-10-CM | POA: Insufficient documentation

## 2014-11-04 DIAGNOSIS — R0981 Nasal congestion: Secondary | ICD-10-CM | POA: Insufficient documentation

## 2014-11-04 DIAGNOSIS — Z01419 Encounter for gynecological examination (general) (routine) without abnormal findings: Secondary | ICD-10-CM | POA: Diagnosis present

## 2014-11-04 DIAGNOSIS — Z0000 Encounter for general adult medical examination without abnormal findings: Secondary | ICD-10-CM

## 2014-11-04 DIAGNOSIS — R7309 Other abnormal glucose: Secondary | ICD-10-CM

## 2014-11-04 NOTE — Addendum Note (Signed)
Addended by: Eliezer Lofts E on: 11/04/2014 11:32 AM   Modules accepted: SmartSet

## 2014-11-04 NOTE — Assessment & Plan Note (Signed)
Refer for sleep eval given snoring, occ apnea, headache and  HTN.

## 2014-11-04 NOTE — Progress Notes (Signed)
53 year old pt  who presents for annual wellness visit.  She has been coughing ( dry cough) x 4 days, headache across front of nose x 2 weeks  No ear pain. No fever. She has been taking alka seltzer plus (contains phenylepherine) No SOB.  Daughter says she snores a lot and occ stops breathing, worse lately. Wakes up rested.  Hypertension: BP increased but has been taking decongestant  NO LONGER ON Losartan HCTZ.  Checking with nutritionist every 2 weeks 110/68-152/80 BP Readings from Last 3 Encounters:  11/04/14 150/92  05/26/14 110/68  04/08/14 112/78  Has gained 10lbs back, has been stress eating. Wt Readings from Last 3 Encounters:  11/04/14 195 lb 4 oz (88.565 kg)  05/26/14 185 lb (83.915 kg)  04/08/14 181 lb (82.101 kg)    Lab Results  Component Value Date   CHOL 158 10/28/2014   HDL 72.60 10/28/2014   LDLCALC 74 10/28/2014   TRIG 55.0 10/28/2014   CHOLHDL 2 10/28/2014   'New diagnosis prediabetes  Restarted walking lately.   Review of Systems  Constitutional: Negative for fever and fatigue.  HENT: Negative for ear pain.  Eyes: Negative for pain.  Respiratory: Negative for chest tightness and shortness of breath.  Cardiovascular: Negative for chest pain, palpitations and leg swelling.  Gastrointestinal: Negative for abdominal pain.  Genitourinary: Negative for dysuria.       Objective:   Physical Exam  Constitutional: Vital signs are normal. She appears well-developed and well-nourished. She is cooperative. Non-toxic appearance. She does not appear ill. No distress.  HENT:  Head: Normocephalic.  Right Ear: Hearing, tympanic membrane, external ear and ear canal normal. Tympanic membrane is not erythematous, not retracted and not bulging.  Left Ear: Hearing, tympanic membrane, external ear and ear canal normal. Tympanic membrane is not erythematous, not retracted and not bulging.  Nose: Nose normal. No mucosal edema or rhinorrhea. Right sinus exhibits  no maxillary sinus tenderness and no frontal sinus tenderness. Left sinus exhibits no maxillary sinus tenderness and no frontal sinus tenderness.  Mouth/Throat: Uvula is midline, oropharynx is clear and moist and mucous membranes are normal.  Eyes: Conjunctivae, EOM and lids are normal. Pupils are equal, round, and reactive to light. Lids are everted and swept, no foreign bodies found.  Neck: Trachea normal and normal range of motion. Neck supple. Carotid bruit is not present. No mass and no thyromegaly present.  Cardiovascular: Normal rate, regular rhythm, S1 normal, S2 normal, normal heart sounds, intact distal pulses and normal pulses. Exam reveals no gallop and no friction rub.  No murmur heard. Pulmonary/Chest: Effort normal and breath sounds normal. Not tachypneic. No respiratory distress. She has no decreased breath sounds. She has no wheezes. She has no rhonchi. She has no rales.  Abdominal: Soft. Normal appearance and bowel sounds are normal. She exhibits no distension, no fluid wave, no abdominal bruit and no mass. There is no hepatosplenomegaly. There is no tenderness. There is no rebound, no guarding and no CVA tenderness. No hernia.  Genitourinary: Vagina normal and uterus normal. No breast swelling, tenderness, discharge or bleeding. Pelvic exam was performed with patient prone. There is no rash, tenderness or lesion on the right labia. There is no rash, tenderness or lesion on the left labia. Uterus is not enlarged and not tender. Cervix exhibits no motion tenderness, no discharge and no friability. Right adnexum displays no mass, no tenderness and no fullness. Left adnexum displays no mass, no tenderness and no fullness.  Lymphadenopathy:  She has no cervical adenopathy.   She has no axillary adenopathy.  Neurological: She is alert. She has normal strength. No cranial nerve deficit or sensory deficit.  Skin: Skin is warm, dry and intact. No rash noted.  Psychiatric: Her speech is  normal and behavior is normal. Judgment and thought content normal. Her mood appears not anxious. Cognition and memory are normal. She does not exhibit a depressed mood.          Assessment & Plan:  The patient's preventative maintenance and recommended screening tests for an annual wellness exam were reviewed in full today. Brought up to date unless services declined.  Counselled on the importance of diet, exercise, and its role in overall health and mortality. The patient's FH and SH was reviewed, including their home life, tobacco status, and drug and alcohol status.   Mammo: nml 11/2013, due in 2016 Colon: Never had colonoscopy, no family hx of colon cancer. She is interested in referral to GI for colonoscpy. Former smoker minimal use, 3 pack years, remote Vaccines: flu refused and tdap 2010 2013, 2014 nml pap, will do 1 more in a row then space, yearly DVE. Mother died with uterine cancer.

## 2014-11-04 NOTE — Assessment & Plan Note (Signed)
Elevated today likley due to weight gain and decongestant.  Stop decongestant, follow BP at home. Call if > 150/90. Encouraged exercise, weight loss, healthy eating habits.

## 2014-11-04 NOTE — Assessment & Plan Note (Signed)
Low carb diet 

## 2014-11-04 NOTE — Addendum Note (Signed)
Addended by: Carter Kitten on: 11/04/2014 11:31 AM   Modules accepted: Orders

## 2014-11-04 NOTE — Patient Instructions (Addendum)
Follow BP at home, goal < 150/90, if above call to discuss restarting medication. Stop the phenylephrine, avoid decongestants. Stop at front desk to set up referral to sleep specialist for sleep apnea eval. Work on low carb diet, continue walking, try to get 150 min of exercise per week. Set up mammogram in 11/2014.  Set up colonoscopy.

## 2014-11-04 NOTE — Assessment & Plan Note (Signed)
Treat with flonsaew and mucinex. If not improving treat with antibiotics for acute sinus infection.

## 2014-11-04 NOTE — Progress Notes (Signed)
Pre visit review using our clinic review tool, if applicable. No additional management support is needed unless otherwise documented below in the visit note. 

## 2014-11-07 LAB — CYTOLOGY - PAP

## 2014-11-09 ENCOUNTER — Encounter: Payer: Self-pay | Admitting: *Deleted

## 2014-11-10 ENCOUNTER — Encounter: Payer: Self-pay | Admitting: *Deleted

## 2014-11-28 ENCOUNTER — Other Ambulatory Visit: Payer: Self-pay | Admitting: Family Medicine

## 2014-11-28 DIAGNOSIS — Z1231 Encounter for screening mammogram for malignant neoplasm of breast: Secondary | ICD-10-CM

## 2014-12-03 ENCOUNTER — Ambulatory Visit (HOSPITAL_COMMUNITY)
Admission: RE | Admit: 2014-12-03 | Discharge: 2014-12-03 | Disposition: A | Discharge status: Home / Self Care | Payer: 59 | Source: Ambulatory Visit | Attending: Family Medicine | Admitting: Family Medicine

## 2014-12-03 DIAGNOSIS — Z1231 Encounter for screening mammogram for malignant neoplasm of breast: Secondary | ICD-10-CM | POA: Insufficient documentation

## 2014-12-15 ENCOUNTER — Encounter: Payer: Self-pay | Admitting: Pulmonary Disease

## 2014-12-15 ENCOUNTER — Ambulatory Visit (INDEPENDENT_AMBULATORY_CARE_PROVIDER_SITE_OTHER): Payer: 59 | Admitting: Pulmonary Disease

## 2014-12-15 ENCOUNTER — Encounter (INDEPENDENT_AMBULATORY_CARE_PROVIDER_SITE_OTHER): Payer: Self-pay

## 2014-12-15 VITALS — BP 100/82 | HR 69 | Ht 64.0 in | Wt 197.2 lb

## 2014-12-15 DIAGNOSIS — R0683 Snoring: Secondary | ICD-10-CM

## 2014-12-15 MED ORDER — MOMETASONE FUROATE 50 MCG/ACT NA SUSP: 2.0000 | Freq: Every day | NASAL | Status: DC

## 2014-12-15 NOTE — Assessment & Plan Note (Signed)
Proceed with home study Trial of nasal steroid for snoring -nasonex - 1 spray each nare at bedtime  Given excessive daytime somnolence, narrow pharyngeal exam, witnessed apneas & loud snoring, obstructive sleep apnea is very likely & an overnight polysomnogram will be scheduled as a home study. The pathophysiology of obstructive sleep apnea , it's cardiovascular consequences & modes of treatment including CPAP were discused with the patient in detail & they evidenced understanding.

## 2014-12-15 NOTE — Progress Notes (Signed)
Subjective:    Patient ID: Rachel Rivas, female    DOB: 01/26/1961, 54 y.o.   MRN: [id number]  HPI  54 year old African-American woman presents for evaluation of loud snoring and excessive daytime tiredness. 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 Her daughter has noted loud snoring for the last 6 months, she also has morning headaches and tiredness throughout the day. Her husband sleeps with a C Pap machine and does not complain about her snoring. Epworth sleepiness score is 3. Bedtime is around 11 PM, sleep latency is minimal, she prefers to sleep on her right side, increased snoring has been noted on her back, she reports one nocturnal awakening for bathroom visit and is out of bed by 6:30 AM, with frequent headaches, but denies dryness of mouth. She drinks one cup of coffee in the occasional soda. She's gained 10 pounds in the last 2 years. She was diagnosed with hypertension, but then taken off medications and her blood pressure improved. She quit smoking in 1995-less than 10 pack years.  Past Medical History  Diagnosis Date  . Hypertension   . Borderline diabetic    Past Surgical History  Procedure Laterality Date  . Cesarean section    . Sterilization      No Known Allergies  History   Social History  . Marital Status: Married    Spouse Name: N/A    Number of Children: 2  . 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 Smoking status: Former Smoker -- 0.25 packs/day for 12 years    Types: Cigarettes  . Smokeless tobacco: Former Systems developer    Quit date: 11/21/1993  . Alcohol Use: 0.0 oz/week    0 Not specified per week     Comment: occasional  . Drug Use: No     Comment: past; marijuana  . Sexual Activity: Not on file   Other Topics Concern  . Not on file   Social History Narrative   Moved here from [place] two years ago.      Works for [employer].             Family  History  Problem Relation Age of Onset  . Cancer Mother     Uterine/lung  . Hypertension Mother   . Hypertension Father   . Diabetes Father   . Diabetes Mother        Review of Systems  Constitutional: Negative for fever and unexpected weight change.  HENT: Negative for congestion, dental problem, ear pain, nosebleeds, postnasal drip, rhinorrhea, sinus pressure, sneezing, sore throat and trouble swallowing.   Eyes: Negative for redness and itching.  Respiratory: Negative for cough, chest tightness, shortness of breath and wheezing.   Cardiovascular: Negative for palpitations and leg swelling.  Gastrointestinal: Negative for nausea and vomiting.  Genitourinary: Negative for dysuria.  Musculoskeletal: Negative for joint swelling.  Skin: Negative for rash.  Neurological: Negative for headaches.  Hematological: Does not bruise/bleed easily.  Psychiatric/Behavioral: Negative for dysphoric mood. The patient is not nervous/anxious.        Objective:   Physical Exam  Gen. Pleasant, obese, in no distress, normal affect ENT - no lesions, no post nasal drip, class 2 airway Neck: No JVD, no thyromegaly, no carotid bruits Lungs: no use of accessory muscles, no dullness to percussion, decreased without rales or rhonchi  Cardiovascular: Rhythm regular, heart sounds  normal, no murmurs or gallops, no peripheral  edema Abdomen: soft and non-tender, no hepatosplenomegaly, BS normal. Musculoskeletal: No deformities, no cyanosis or clubbing Neuro:  alert, non focal, no tremors       Assessment & Plan:

## 2014-12-15 NOTE — Patient Instructions (Signed)
Proceed with home study Trial of nasal spray -nasonex - 1 spray each nare at bedtime

## 2015-02-09 ENCOUNTER — Ambulatory Visit (HOSPITAL_BASED_OUTPATIENT_CLINIC_OR_DEPARTMENT_OTHER): Payer: 59 | Attending: Pulmonary Disease | Admitting: Radiology

## 2015-02-09 DIAGNOSIS — R0683 Snoring: Secondary | ICD-10-CM | POA: Insufficient documentation

## 2015-02-09 DIAGNOSIS — G4733 Obstructive sleep apnea (adult) (pediatric): Secondary | ICD-10-CM | POA: Diagnosis not present

## 2015-02-19 ENCOUNTER — Telehealth: Payer: Self-pay | Admitting: Pulmonary Disease

## 2015-02-19 DIAGNOSIS — G4733 Obstructive sleep apnea (adult) (pediatric): Secondary | ICD-10-CM

## 2015-02-19 NOTE — Telephone Encounter (Signed)
Pt returning call.Rachel Rivas ° °

## 2015-02-19 NOTE — Telephone Encounter (Signed)
I spoke with patient about results and she verbalized understanding and had no questions Order placed 

## 2015-02-19 NOTE — Telephone Encounter (Signed)
lmtcb

## 2015-02-19 NOTE — Sleep Study (Signed)
    NAME: Rachel Rivas DATE OF BIRTH:  [birth year] MEDICAL RECORD NUMBER [id number]  LOCATION: [facility]  PHYSICIAN: Maleni Seyer V.  DATE OF STUDY: 02/09/2015  SLEEP STUDY TYPE: Out of Center Sleep Test                REFERRING PHYSICIAN: Rigoberto Noel, MD  INDICATION FOR STUDY: 54 year old woman with loud snoring and excessive daytime somnolence. At the time of the study she weighed 185 pounds with a height of 5 feet 4 inches, BMI of 32 Epworth sleepiness score 6  The study was performed using the [place] system. The channels recorded were airflow with a nasal pressure cannula, oxygen saturation using a pulse oximeter, thoracic and abdominal respiratory movements were recorded with RIP belts. Body position was also monitored  Total recording time was 359 minutes. There were a total of 26 obstructive apneas, 11 central apneas, 4 mixed apneas and 81 hypopneas for an apnea-hypopnea index of 20 per hour, The lowest desaturation was 83 %. She spent 13 minutes with a saturation less than 90%. The average heart rate was 68 bpm, the highest heart rate was 101 bpm    IMPRESSION:  Mild obstructive sleep apnea with predominant hypopneas    RECOMMENDATION:  If she is symptomatic, would suggest CPAP titration study. Weight loss would also be recommended. She should be cautioned against driving when sleepy   Rigoberto Noel MD Diplomate, American Board of Sleep Medicine  ELECTRONICALLY SIGNED ON:  02/19/2015, 3:33 PM [place] PH: [phone]   FX: [phone] [place]

## 2015-02-19 NOTE — Telephone Encounter (Signed)
Home study showed moderate OSA-AHI 20 per hour Suggest CPAP titration study if she is willing to proceed

## 2015-02-26 ENCOUNTER — Telehealth: Payer: Self-pay | Admitting: Family Medicine

## 2015-02-26 DIAGNOSIS — D693 Immune thrombocytopenic purpura: Secondary | ICD-10-CM | POA: Insufficient documentation

## 2015-02-26 DIAGNOSIS — D696 Thrombocytopenia, unspecified: Secondary | ICD-10-CM | POA: Insufficient documentation

## 2015-02-26 NOTE — Telephone Encounter (Signed)
Left message for [name] to return my call.

## 2015-02-26 NOTE — Telephone Encounter (Signed)
Notify pt I reviewed her labs from [facility]. Her platelets are low at 73. Reviewing our chart.. Dr. Deborra Medina did a cbc on showing low platelets at 63 in 2014. There is no med on the med list that could cause this.  Some times it can be due to viral infection, but given it appears to be persistent.. I think she needs a referral to hematology for further eval .   I sent a referral in.

## 2015-02-26 NOTE — Telephone Encounter (Signed)
Pt had lab work done at [facility] Monday and they told her that her platelets were low and that she would need to talk to Dr Diona Browner before she could start taking the medications that they would like to have her start taking which is Folic Acid and another medication for arthritis that she couldn't remember the name of. She is requesting a call back as soon as possible.

## 2015-02-26 NOTE — Telephone Encounter (Signed)
Please get a copy of labs and let pt know I will review them ASAP.

## 2015-02-26 NOTE — Telephone Encounter (Signed)
Ms. Peugh notified as instructed by telephone.  She will await call from Julius Bowels with appointment to hematology.

## 2015-02-26 NOTE — Telephone Encounter (Addendum)
Left message with Lattie Haw at [facility] requesting recent labs and office visit to be faxed to our office for Dr. Diona Browner to review.

## 2015-02-27 ENCOUNTER — Telehealth: Payer: Self-pay | Admitting: Oncology

## 2015-02-27 NOTE — Telephone Encounter (Signed)
NEW PATIENT APPT-LEFT MESSAGE FOR PATIENT TO RETURN CALL TO SCHEDULE NP APPT.

## 2015-03-06 ENCOUNTER — Ambulatory Visit (INDEPENDENT_AMBULATORY_CARE_PROVIDER_SITE_OTHER): Payer: 59 | Admitting: Family Medicine

## 2015-03-06 ENCOUNTER — Other Ambulatory Visit: Payer: Self-pay | Admitting: Oncology

## 2015-03-06 ENCOUNTER — Encounter: Payer: Self-pay | Admitting: Family Medicine

## 2015-03-06 VITALS — BP 148/86 | HR 63 | Temp 98.5°F | Ht 64.0 in | Wt 189.8 lb

## 2015-03-06 DIAGNOSIS — R631 Polydipsia: Secondary | ICD-10-CM

## 2015-03-06 DIAGNOSIS — R42 Dizziness and giddiness: Secondary | ICD-10-CM | POA: Diagnosis not present

## 2015-03-06 DIAGNOSIS — D696 Thrombocytopenia, unspecified: Secondary | ICD-10-CM

## 2015-03-06 DIAGNOSIS — M069 Rheumatoid arthritis, unspecified: Secondary | ICD-10-CM

## 2015-03-06 DIAGNOSIS — R0789 Other chest pain: Secondary | ICD-10-CM | POA: Diagnosis not present

## 2015-03-06 LAB — COMPREHENSIVE METABOLIC PANEL
ALBUMIN: 4.3 g/dL (ref 3.5–5.2)
ALT: 16 U/L (ref 0–35)
AST: 14 U/L (ref 0–37)
Alkaline Phosphatase: 71 U/L (ref 39–117)
BILIRUBIN TOTAL: 0.7 mg/dL (ref 0.2–1.2)
BUN: 16 mg/dL (ref 6–23)
CO2: 32 meq/L (ref 19–32)
Calcium: 9.9 mg/dL (ref 8.4–10.5)
Chloride: 101 mEq/L (ref 96–112)
Creatinine, Ser: 0.73 mg/dL (ref 0.40–1.20)
GFR: 106.3 mL/min (ref >60.00)
GLUCOSE: 110 mg/dL — AB (ref 70–99)
Potassium: 3.8 mEq/L (ref 3.5–5.1)
Sodium: 137 mEq/L (ref 135–145)
Total Protein: 7.9 g/dL (ref 6.0–8.3)

## 2015-03-06 LAB — CBC WITH DIFFERENTIAL/PLATELET
Basophils Absolute: 0 10*3/uL (ref 0.0–0.1)
Basophils Relative: 0.5 % (ref 0.0–3.0)
EOS PCT: 2.5 % (ref 0.0–5.0)
Eosinophils Absolute: 0.2 10*3/uL (ref 0.0–0.7)
HEMATOCRIT: 39.6 % (ref 36.0–46.0)
HEMOGLOBIN: 13.4 g/dL (ref 12.0–15.0)
LYMPHS ABS: 3.4 10*3/uL (ref 0.7–4.0)
Lymphocytes Relative: 41.5 % (ref 12.0–46.0)
MCHC: 33.8 g/dL (ref 30.0–36.0)
MCV: 80 fl (ref 78.0–100.0)
MONO ABS: 0.6 10*3/uL (ref 0.1–1.0)
Monocytes Relative: 7 % (ref 3.0–12.0)
NEUTROS PCT: 48.5 % (ref 43.0–77.0)
Neutro Abs: 3.9 10*3/uL (ref 1.4–7.7)
PLATELETS: 86 10*3/uL — AB (ref 150.0–400.0)
RBC: 4.94 Mil/uL (ref 3.87–5.11)
RDW: 15.6 % — AB (ref 11.5–15.5)
WBC: 8.1 10*3/uL (ref 4.0–10.5)

## 2015-03-06 NOTE — Patient Instructions (Addendum)
Start flonase 2 sprays per nostril daily.  Also use nasal saline  Spray 2-3 times per day. Stop by lab on way out.  EKG is normal. Call if any new symptoms like fever > 101.4 or increase in shortness of breath.

## 2015-03-06 NOTE — Progress Notes (Signed)
   Subjective:    Patient ID: Rachel Rivas, female    DOB: Nov 19, 1961, 54 y.o.   MRN: [id number]  HPI  54 year old female presents with new onset  dizziness (no vertigo) ongoing x 2 days. Intermittent, trigger some with pressure on nasal bridge or moving head, some time when going from sitting to standing.  Has been having chest tightness. Cough, occ productive. Runny nose.  Some sinus pressure on left. Headache.  B ear fullness. Occ shortness of breath.  No fever.  Had viral URI few weeks ago.  Using alkaseltzer plus, no help.  She took prednisone for RA , on for 10 days ( last dose 3 days ago), stopped because was making her feel strange. Stopped flare up of RA. Very thirsty.   She was found to have low platelet at recent labs at [facility],  This seems to have bee a chronic issue for her plt 2014 63.  She has been referred to heme. No easy bleeding or bruising.     Review of Systems  Constitutional: Negative for fever and fatigue.  HENT: Negative for ear pain.   Eyes: Negative for pain.  Respiratory: Positive for shortness of breath. Negative for chest tightness.   Cardiovascular: Positive for chest pain. Negative for palpitations and leg swelling.  Gastrointestinal: Negative for abdominal pain.  Genitourinary: Negative for dysuria.       Objective:   Physical Exam  Constitutional: Vital signs are normal. She appears well-developed and well-nourished. She is cooperative.  Non-toxic appearance. She does not appear ill. No distress.  Fatigued but nontoxic appearing  HENT:  Head: Normocephalic.  Right Ear: Hearing, tympanic membrane, external ear and ear canal normal. Tympanic membrane is not erythematous, not retracted and not bulging.  Left Ear: Hearing, tympanic membrane, external ear and ear canal normal. Tympanic membrane is not erythematous, not retracted and not bulging.  Nose: No mucosal edema or rhinorrhea. Right sinus exhibits no maxillary sinus tenderness and no  frontal sinus tenderness. Left sinus exhibits no maxillary sinus tenderness and no frontal sinus tenderness.  Mouth/Throat: Uvula is midline, oropharynx is clear and moist and mucous membranes are normal.  Eyes: Conjunctivae, EOM and lids are normal. Pupils are equal, round, and reactive to light. Lids are everted and swept, no foreign bodies found.  Neck: Trachea normal and normal range of motion. Neck supple. Carotid bruit is not present. No thyroid mass and no thyromegaly present.  Cardiovascular: Normal rate, regular rhythm, S1 normal, S2 normal, normal heart sounds, intact distal pulses and normal pulses.  Exam reveals no gallop and no friction rub.   No murmur heard. Pulmonary/Chest: Effort normal and breath sounds normal. No tachypnea. No respiratory distress. She has no decreased breath sounds. She has no wheezes. She has no rhonchi. She has no rales.  Abdominal: Soft. Normal appearance and bowel sounds are normal. There is no tenderness.  Neurological: She is alert.  Skin: Skin is warm, dry and intact. No rash noted.  Psychiatric: Her speech is normal and behavior is normal. Judgment and thought content normal. Her mood appears not anxious. Cognition and memory are normal. She does not exhibit a depressed mood.          Assessment & Plan:

## 2015-03-06 NOTE — Progress Notes (Signed)
Pre visit review using our clinic review tool, if applicable. No additional management support is needed unless otherwise documented below in the visit note. 

## 2015-03-09 NOTE — Assessment & Plan Note (Signed)
EKG without specific changes.

## 2015-03-09 NOTE — Assessment & Plan Note (Signed)
Will eval with labs for diabetes.

## 2015-03-09 NOTE — Assessment & Plan Note (Signed)
Re-eval. Pt to keep scheduled appt with heme for further eval,

## 2015-03-09 NOTE — Assessment & Plan Note (Signed)
Most clearly secondary to eustacian tube dysfunction.  Start flonase 2 sprays per nostril daily.  Also use nasal saline  Spray 2-3 times per day. Eval with labs as well for other cause.

## 2015-03-10 ENCOUNTER — Ambulatory Visit (HOSPITAL_BASED_OUTPATIENT_CLINIC_OR_DEPARTMENT_OTHER): Payer: 59 | Admitting: Oncology

## 2015-03-10 ENCOUNTER — Other Ambulatory Visit (HOSPITAL_BASED_OUTPATIENT_CLINIC_OR_DEPARTMENT_OTHER): Payer: 59

## 2015-03-10 ENCOUNTER — Telehealth: Payer: Self-pay | Admitting: Oncology

## 2015-03-10 ENCOUNTER — Encounter: Payer: Self-pay | Admitting: Oncology

## 2015-03-10 ENCOUNTER — Ambulatory Visit: Payer: 59

## 2015-03-10 VITALS — BP 177/94 | HR 90 | Temp 97.8°F | Resp 18 | Ht 64.0 in | Wt 189.4 lb

## 2015-03-10 DIAGNOSIS — D696 Thrombocytopenia, unspecified: Secondary | ICD-10-CM | POA: Diagnosis not present

## 2015-03-10 DIAGNOSIS — M069 Rheumatoid arthritis, unspecified: Secondary | ICD-10-CM | POA: Diagnosis not present

## 2015-03-10 NOTE — Progress Notes (Signed)
Please see consult note.  

## 2015-03-10 NOTE — Consult Note (Signed)
Reason for Referral: Thrombocytopenia.   HPI: 54 year old woman native of [place] but currently lives in this area and have done so for a while. She has a history of hypertension and diet controlled diabetes as well as rheumatoid arthritis. She was diagnosed with arthritis but a year ago and follows up with Dr. Amil Amen at [facility]. She has been under consideration to start methotrexate but she was noted due to have thrombocytopenia on her most recent laboratory testing. On 02/23/2015 her platelet count was 73,000. She had a normal CBC otherwise with normal white cell count and hemoglobin. Previous to that in June 2015 her platelet count was 58,000. She was treated with prednisone for her rheumatoid arthritis flare and a repeat his CBC done on 03/06/2015 showed a platelet count of 86,000. Clinically, she is asymptomatic at this time. She reports her joint pain especially in her hands have improved with the prednisone. She does not report any petechiae or easy bruisability. She did not report any epistaxis, hematochezia, melena, hematuria or excessive vaginal bleeding. She does not report any headaches, blurry vision, syncope or seizures. She does not report any fevers, chills, sweats. She does not report any cough or hemoptysis hematemesis. She does not report any nausea, vomiting, abdominal pain, hematochezia, melena, constipation or diarrhea. She does not report any frequency, urgency or hesitancy. She does not report any mood issues or depression. She does not report any lymphadenopathy. Remaining review of systems unremarkable.   Past Medical History  Diagnosis Date  . Hypertension   . Borderline diabetic   :  Past Surgical History  Procedure Laterality Date  . Cesarean section    . Sterilization    :   Current outpatient prescriptions:  .  mometasone (NASONEX) 50 MCG/ACT nasal spray, Place 2 sprays into the nose daily., Disp: 7.5 g, Rfl: 0:  Allergies  Allergen  Reactions  . Latex Itching, Dermatitis and Rash  :  Family History  Problem Relation Age of Onset  . Cancer Mother     Uterine/lung  . Hypertension Mother   . Hypertension Father   . Diabetes Father   . Diabetes Mother   :  History   Social History  . Marital Status: Married    Spouse Name: N/A  . Number of Children: 2  . 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 Smoking status: Former Smoker -- 0.25 packs/day for 12 years    Types: Cigarettes  . Smokeless tobacco: Former Systems developer    Quit date: 11/21/1993  . Alcohol Use: 0.0 oz/week    0 Standard drinks or equivalent per week     Comment: occasional  . Drug Use: No     Comment: past; marijuana  . Sexual Activity: Not on file   Other Topics Concern  . Not on file   Social History Narrative   Moved here from [place] two years ago.      Works for [employer].           :  Pertinent items are noted in HPI.  Exam: ECOG 0 Blood pressure 177/94, pulse 90, temperature 97.8 F (36.6 C), temperature source Oral, resp. rate 18, height 5\' 4"  (1.626 m), weight 189 lb 6.4 oz (85.911 kg), last menstrual period 11/17/2012, SpO2 100 %. General appearance: alert and cooperative Head: Normocephalic, without obvious abnormality Throat: lips, mucosa, and tongue normal; teeth and gums normal Neck: no adenopathy  Back: negative Resp: clear to auscultation bilaterally Chest wall: no tenderness Cardio: regular rate and rhythm, S1, S2 normal, no murmur, click, rub or gallop GI: soft, non-tender; bowel sounds normal; no masses,  no organomegaly Extremities: extremities normal, atraumatic, no cyanosis or edema Pulses: 2+ and symmetric Skin: Skin color, texture, turgor normal. No rashes or lesions Lymph nodes: Cervical, supraclavicular, and axillary nodes normal.  CBC    Component Value Date/Time   WBC 8.1 03/06/2015 1400   RBC 4.94 03/06/2015 1400    HGB 13.4 03/06/2015 1400   HCT 39.6 03/06/2015 1400   PLT 86.0* 03/06/2015 1400   MCV 80.0 03/06/2015 1400   MCHC 33.8 03/06/2015 1400   RDW 15.6* 03/06/2015 1400   LYMPHSABS 3.4 03/06/2015 1400   MONOABS 0.6 03/06/2015 1400   EOSABS 0.2 03/06/2015 1400   BASOSABS 0.0 03/06/2015 1400     Assessment and Plan:   54 year old woman with the following issues:  1. Thrombocytopenia: Her platelet count have been relatively low dating back to at least 2014. Her platelet count on 03/21/2013 was 63,000. It was as low as 58,000 and 2015. The differential diagnosis was discussed today and most likely this finding represents immune thrombocytopenia. Given her history of rheumatoid arthritis and most likely etiology of her thrombocytopenia would be related to an autoimmune process rather than a bone marrow disease. Her white cell count, hemoglobin and white cell count are  perfectly normal with a normal differential. I do not see any other medications that are contributing at this time. The fact that her platelet count responded with an elevation up to 86,000 after a period of prednisone supports the diagnosis of ITP.  From a management standpoint at this time she does not require treatment. Her platelet count is more than adequate to sustain any therapy or surgery. If her platelet counts dips below 30,000 or if she develops bleeding, we will consider steroids, IVIG or any other modalities. For the time being we'll continue observation and surveillance and repeat blood count in 6 months.  2. Rheumatoid arthritis: She is under consideration for possible methotrexate. I have no objections to using this medication with her current platelet count is low though she has frequent laboratory monitoring.  All her questions are answered to her satisfaction.

## 2015-03-10 NOTE — Progress Notes (Signed)
Checked in new pt with no financial concerns. °

## 2015-03-10 NOTE — Telephone Encounter (Signed)
Pt confirmed labs/ov per 04/19 POF, gave pt AVS and Calendar..... KJ  °

## 2015-04-09 ENCOUNTER — Telehealth: Payer: Self-pay | Admitting: *Deleted

## 2015-04-09 DIAGNOSIS — G4733 Obstructive sleep apnea (adult) (pediatric): Secondary | ICD-10-CM

## 2015-04-09 NOTE — Telephone Encounter (Signed)
-----   Message from Rigoberto Noel, MD sent at 04/09/2015  1:02 AM EDT ----- This order was placed 3/31 - why so long for refusal? Sharyn Lull, pl let pt know & place order for autoCPAP 5-12 cm, mask of choice, download in4 wks, arrange OV   RA  ----- Message -----    From: Joellen Jersey    Sent: 04/08/2015   4:04 PM      To: Rigoberto Noel, MD  [place] Director denied the cpap titration study stating pt needs auto cpap first or you may cal lthe medical director@[phone] case# [id number] to do peer to peer thanks libby

## 2015-04-09 NOTE — Telephone Encounter (Signed)
Order placed for CPAP.  Appointment scheduled for follow up.  Nothing further needed.

## 2015-04-27 ENCOUNTER — Encounter (HOSPITAL_BASED_OUTPATIENT_CLINIC_OR_DEPARTMENT_OTHER): Payer: 59

## 2015-06-05 ENCOUNTER — Ambulatory Visit: Payer: 59 | Admitting: Pulmonary Disease

## 2015-09-09 ENCOUNTER — Other Ambulatory Visit (HOSPITAL_BASED_OUTPATIENT_CLINIC_OR_DEPARTMENT_OTHER): Payer: 59

## 2015-09-09 ENCOUNTER — Ambulatory Visit (HOSPITAL_BASED_OUTPATIENT_CLINIC_OR_DEPARTMENT_OTHER): Payer: 59 | Admitting: Oncology

## 2015-09-09 VITALS — BP 160/86 | HR 70 | Temp 98.1°F | Resp 16 | Ht 64.0 in | Wt 198.3 lb

## 2015-09-09 DIAGNOSIS — D696 Thrombocytopenia, unspecified: Secondary | ICD-10-CM | POA: Diagnosis not present

## 2015-09-09 DIAGNOSIS — M069 Rheumatoid arthritis, unspecified: Secondary | ICD-10-CM | POA: Diagnosis not present

## 2015-09-09 LAB — CBC WITH DIFFERENTIAL/PLATELET
BASO%: 0.2 % (ref 0.0–2.0)
Basophils Absolute: 0 10*3/uL (ref 0.0–0.1)
EOS%: 2.9 % (ref 0.0–7.0)
Eosinophils Absolute: 0.2 10*3/uL (ref 0.0–0.5)
HEMATOCRIT: 38.9 % (ref 34.8–46.6)
HEMOGLOBIN: 12.6 g/dL (ref 11.6–15.9)
LYMPH#: 2 10*3/uL (ref 0.9–3.3)
LYMPH%: 38.7 % (ref 14.0–49.7)
MCH: 27.8 pg (ref 25.1–34.0)
MCHC: 32.4 g/dL (ref 31.5–36.0)
MCV: 85.9 fL (ref 79.5–101.0)
MONO#: 0.4 10*3/uL (ref 0.1–0.9)
MONO%: 6.7 % (ref 0.0–14.0)
NEUT%: 51.5 % (ref 38.4–76.8)
NEUTROS ABS: 2.7 10*3/uL (ref 1.5–6.5)
RBC: 4.53 10*6/uL (ref 3.70–5.45)
RDW: 15.1 % — ABNORMAL HIGH (ref 11.2–14.5)
WBC: 5.3 10*3/uL (ref 3.9–10.3)
nRBC: 0 % (ref 0–0)

## 2015-09-09 LAB — COMPREHENSIVE METABOLIC PANEL (CC13)
ALT: 17 U/L (ref 0–55)
ANION GAP: 7 meq/L (ref 3–11)
AST: 16 U/L (ref 5–34)
Albumin: 4 g/dL (ref 3.5–5.0)
Alkaline Phosphatase: 79 U/L (ref 40–150)
BUN: 15.2 mg/dL (ref 7.0–26.0)
CO2: 27 meq/L (ref 22–29)
Calcium: 9.5 mg/dL (ref 8.4–10.4)
Chloride: 108 mEq/L (ref 98–109)
Creatinine: 0.8 mg/dL (ref 0.6–1.1)
EGFR: >90 mL/min/{1.73_m2} (ref >90)
Glucose: 108 mg/dl (ref 70–140)
POTASSIUM: 3.9 meq/L (ref 3.5–5.1)
SODIUM: 143 meq/L (ref 136–145)
Total Bilirubin: 0.68 mg/dL (ref 0.20–1.20)
Total Protein: 7.4 g/dL (ref 6.4–8.3)

## 2015-09-09 NOTE — Progress Notes (Signed)
Hematology and Oncology Follow Up Visit  Rachel Rivas [id number] 06-Mar-1961 54 y.o. 09/09/2015 9:03 AM Rachel Rivas, MDBedsole, Rachel E, MD   Principle Diagnosis: 54 year old woman with thrombocytopenia diagnosed April 2016. The diagnosis is likely ITP versus medication effect. She also has history of rheumatoid arthritis and currently on methotrexate.   Current therapy: Observation and surveillance.  Interim History: [name] presents today for a follow-up visit. Since the last visit, her dose of methotrexate has increased and currently taking 7 tablets of 2.5 mg weekly. That have helped her symptoms dramatically in terms of joint stiffness and pain. She is able to perform all activities of daily living including driving and attending school. She has not reported any bleeding complications such as epistaxis, hematochezia or melena. Has not reported any lymphadenopathy or petechiae. She has not reported any recent illnesses or hospitalization.  She does not report any headaches, blurry vision, syncope or seizures. She does not report any fevers, chills, sweats. She does not report any cough or hemoptysis hematemesis. She does not report any nausea, vomiting, abdominal pain, hematochezia, melena, constipation or diarrhea. She does not report any frequency, urgency or hesitancy. She does not report any mood issues or depression. She does not report any lymphadenopathy. Remaining review of systems unremarkable.   Medications: I have reviewed the patient's current medications.  Current Outpatient Prescriptions  Medication Sig Dispense Refill  . mometasone (NASONEX) 50 MCG/ACT nasal spray Place 2 sprays into the nose daily. 7.5 g 0   No current facility-administered medications for this visit.     Allergies:  Allergies  Allergen Reactions  . Latex Itching, Dermatitis and Rash    Past Medical History, Surgical history, Social history, and Family History were reviewed and  updated.   Physical Exam: Blood pressure 160/86, pulse 70, temperature 98.1 F (36.7 C), temperature source Oral, resp. rate 16, height 5\' 4"  (1.626 m), weight 198 lb 4.8 oz (89.948 kg), last menstrual period 11/17/2012, SpO2 99 %. ECOG: 0 General appearance: alert and cooperative Head: Normocephalic, without obvious abnormality no oral thrush.  Neck: no adenopathy and no carotid bruit Lymph nodes: Cervical, supraclavicular, and axillary nodes normal. Heart:regular rate and rhythm, S1, S2 normal, no murmur, click, rub or gallop Lung:chest clear, no wheezing, rales, normal symmetric air entry Abdomin: soft, non-tender, without masses or organomegaly EXT:no erythema, induration, or nodules Skin showed no petechiae.  Lab Results: Lab Results  Component Value Date   WBC 8.1 03/06/2015   HGB 13.4 03/06/2015   HCT 39.6 03/06/2015   MCV 80.0 03/06/2015   PLT 86.0* 03/06/2015     Chemistry      Component Value Date/Time   NA 137 03/06/2015 1400   K 3.8 03/06/2015 1400   CL 101 03/06/2015 1400   CO2 32 03/06/2015 1400   BUN 16 03/06/2015 1400   CREATININE 0.73 03/06/2015 1400      Component Value Date/Time   CALCIUM 9.9 03/06/2015 1400   ALKPHOS 71 03/06/2015 1400   AST 14 03/06/2015 1400   ALT 16 03/06/2015 1400   BILITOT 0.7 03/06/2015 1400       Impression and Plan:   54 year old woman with the following issues:  1. Thrombocytopenia: The differential diagnosis including ITP versus medication effect related to methotrexate. Her platelet count have ranged between 60-80,000 and the last 2 years. She has no symptoms at this time but including bleeding or bruising. The plan is to continue with observation and surveillance and use of prednisone if her platelet counts  drop below 50 or she develops any bleeding.  2. Rheumatoid arthritis: Is currently on methotrexate which have helped her symptoms. Generally methotrexate can cause thrombocytopenia and her counts are monitored  closely by the prescribing physician.  3. Follow-up: Will be in 6 months but sooner if needed to few develops any signs or symptoms of bleeding or platelet counts drop down below 50.  [facility], MD 10/19/20169:03 AM

## 2015-11-05 ENCOUNTER — Ambulatory Visit (INDEPENDENT_AMBULATORY_CARE_PROVIDER_SITE_OTHER): Payer: 59 | Admitting: Primary Care

## 2015-11-05 ENCOUNTER — Ambulatory Visit (INDEPENDENT_AMBULATORY_CARE_PROVIDER_SITE_OTHER)
Admission: RE | Admit: 2015-11-05 | Discharge: 2015-11-05 | Disposition: A | Discharge status: Home / Self Care | Payer: 59 | Source: Ambulatory Visit | Attending: Primary Care | Admitting: Primary Care

## 2015-11-05 ENCOUNTER — Ambulatory Visit: Payer: 59 | Admitting: Primary Care

## 2015-11-05 ENCOUNTER — Encounter: Payer: Self-pay | Admitting: Primary Care

## 2015-11-05 VITALS — BP 152/102 | HR 72 | Temp 97.2°F | Ht 64.0 in | Wt 198.1 lb

## 2015-11-05 DIAGNOSIS — M545 Low back pain, unspecified: Secondary | ICD-10-CM

## 2015-11-05 MED ORDER — KETOROLAC TROMETHAMINE 60 MG/2ML IM SOLN
60.0000 mg | Freq: Once | INTRAMUSCULAR | Status: AC
  Administered 2015-11-05: 60 mg via INTRAMUSCULAR

## 2015-11-05 NOTE — Progress Notes (Signed)
Pre visit review using our clinic review tool, if applicable. No additional management support is needed unless otherwise documented below in the visit note. 

## 2015-11-05 NOTE — Patient Instructions (Addendum)
Complete xray(s) prior to leaving today. I will notify you of your results once received.  You may take cyclobenzaprine 5 mg tablets three times daily as needed for muscle spasm. Caution as this medication may cause drowsiness.  You've been provided with a injection for toradol for pain.  Apply heat and rest. I'll be in touch either today or tomorrow.  It was a pleasure to see you today!  Back Pain, Adult Back pain is very common in adults.The cause of back pain is rarely dangerous and the pain often gets better over time.The cause of your back pain may not be known. Some common causes of back pain include:  Strain of the muscles or ligaments supporting the spine.  Wear and tear (degeneration) of the spinal disks.  Arthritis.  Direct injury to the back. For many people, back pain may return. Since back pain is rarely dangerous, most people can learn to manage this condition on their own. HOME CARE INSTRUCTIONS Watch your back pain for any changes. The following actions may help to lessen any discomfort you are feeling:  Remain active. It is stressful on your back to sit or stand in one place for long periods of time. Do not sit, drive, or stand in one place for more than 30 minutes at a time. Take short walks on even surfaces as soon as you are able.Try to increase the length of time you walk each day.  Exercise regularly as directed by your health care provider. Exercise helps your back heal faster. It also helps avoid future injury by keeping your muscles strong and flexible.  Do not stay in bed.Resting more than 1-2 days can delay your recovery.  Pay attention to your body when you bend and lift. The most comfortable positions are those that put less stress on your recovering back. Always use proper lifting techniques, including:  Bending your knees.  Keeping the load close to your body.  Avoiding twisting.  Find a comfortable position to sleep. Use a firm mattress and  lie on your side with your knees slightly bent. If you lie on your back, put a pillow under your knees.  Avoid feeling anxious or stressed.Stress increases muscle tension and can worsen back pain.It is important to recognize when you are anxious or stressed and learn ways to manage it, such as with exercise.  Take medicines only as directed by your health care provider. Over-the-counter medicines to reduce pain and inflammation are often the most helpful.Your health care provider may prescribe muscle relaxant drugs.These medicines help dull your pain so you can more quickly return to your normal activities and healthy exercise.  Apply ice to the injured area:  Put ice in a plastic bag.  Place a towel between your skin and the bag.  Leave the ice on for 20 minutes, 2-3 times a day for the first 2-3 days. After that, ice and heat may be alternated to reduce pain and spasms.  Maintain a healthy weight. Excess weight puts extra stress on your back and makes it difficult to maintain good posture. SEEK MEDICAL CARE IF:  You have pain that is not relieved with rest or medicine.  You have increasing pain going down into the legs or buttocks.  You have pain that does not improve in one week.  You have night pain.  You lose weight.  You have a fever or chills. SEEK IMMEDIATE MEDICAL CARE IF:   You develop new bowel or bladder control problems.  You have  unusual weakness or numbness in your arms or legs.  You develop nausea or vomiting.  You develop abdominal pain.  You feel faint.   This information is not intended to replace advice given to you by your health care provider. Make sure you discuss any questions you have with your health care provider.   Document Released: 11/07/2005 Document Revised: 11/28/2014 Document Reviewed: 03/11/2014 Elsevier Interactive Patient Education [employer].

## 2015-11-05 NOTE — Progress Notes (Signed)
Subjective:    Patient ID: Rachel Rivas, female    DOB: 1961-02-19, 54 y.o.   MRN: [id number]  HPI  Rachel Rivas is a 53 year old female who presents today with a chief complaint of back pain. She has a history of back pain and rheumatoid arthritis and is currently managed on methotrexate 2.5 mg. She underwent xrays of her lumbar spine in December 2013 which showed facet degenerative changes in L4-5 and L5-S1.  Her back pain has been present since yesterday. She bent over to pick up an object  from the floor and felt an immediate pain to the left side of her lower  back. She stood up, walked a few steps, and bent forward again. The second time her pain became worse. She describes her pain as tightness and sharp. Walking, sitting, standing, makes her pain worse. She's placed a lidocaine patch to her back yesterday without improvement. She denies numbness/tinlging to her legs.   Review of Systems  Musculoskeletal: Positive for back pain.  Neurological: Positive for weakness. Negative for numbness.       Past Medical History  Diagnosis Date  . Hypertension   . Borderline diabetic     Social History   Social History  . Marital Status: Married    Spouse Name: N/A  . Number of Children: 2  . 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 Smoking status: Former Smoker -- 0.25 packs/day for 12 years    Types: Cigarettes  . Smokeless tobacco: Former Systems developer    Quit date: 11/21/1993  . Alcohol Use: 0.0 oz/week    0 Standard drinks or equivalent per week     Comment: occasional  . Drug Use: No     Comment: past; marijuana  . Sexual Activity: Not on file   Other Topics Concern  . Not on file   Social History Narrative   Moved here from [place] two years ago.      Works for [employer].             Past Surgical History  Procedure Laterality Date  . Cesarean section    . Sterilization       Family History  Problem Relation Age of Onset  . Cancer Mother     Uterine/lung  . Hypertension Mother   . Hypertension Father   . Diabetes Father   . Diabetes Mother     Allergies  Allergen Reactions  . Latex Itching, Dermatitis and Rash    Current Outpatient Prescriptions on File Prior to Visit  Medication Sig Dispense Refill  . FOLIC ACID PO Take 1 mg by mouth 2 (two) times daily.    . methotrexate (RHEUMATREX) 2.5 MG tablet Take 8 tablets by mouth once a week.    . mometasone (NASONEX) 50 MCG/ACT nasal spray Place 2 sprays into the nose daily. (Patient not taking: Reported on 11/05/2015) 7.5 g 0   No current facility-administered medications on file prior to visit.    BP 152/102 mmHg  Pulse 72  Temp(Src) 97.2 F (36.2 C) (Oral)  Ht 5\' 4"  (1.626 m)  Wt 198 lb 1.9 oz (89.867 kg)  BMI 33.99 kg/m2  SpO2 98%  LMP 11/17/2012    Objective:   Physical Exam  Constitutional: She appears well-nourished.  Cardiovascular: Normal rate and regular rhythm.   Pulmonary/Chest: Effort normal and breath sounds normal.  Musculoskeletal:  Lumbar back: She exhibits decreased range of motion, tenderness and pain.  Moderate to severe pain upon palpation to left lower back. Moderate pain to mid lower lumbar spine.  Exam difficult to perform as she's in quite a bit of pain. Requiring wheelchair for transportation.  Skin: Skin is warm and dry.          Assessment & Plan:  Back Pain:  Located to mid lower back along spine, and left lower back. History of back pain prior. Xray from 2013 reviewed. Moderate amount of pain today, especially during exam. No radiculopathy. Very tender over MSK area to left lower back and also along mid lumbar spine.  Suspect more MSK involvement, however, given her history will also get lumbar xray. Recent CMP reviewed. IM tordaol provided today. She has a RX of cyclobenzaprine, instructions provided regarding administration. If not  improved will consider MRI for possible disc involvement.

## 2015-11-05 NOTE — Addendum Note (Signed)
Addended by: Josetta Huddle on: 11/05/2015 12:30 PM   Modules accepted: Orders

## 2015-12-08 ENCOUNTER — Telehealth: Payer: Self-pay | Admitting: Family Medicine

## 2015-12-08 ENCOUNTER — Other Ambulatory Visit (INDEPENDENT_AMBULATORY_CARE_PROVIDER_SITE_OTHER): Payer: BLUE CROSS/BLUE SHIELD

## 2015-12-08 ENCOUNTER — Other Ambulatory Visit: Payer: Self-pay | Admitting: Family Medicine

## 2015-12-08 DIAGNOSIS — D696 Thrombocytopenia, unspecified: Secondary | ICD-10-CM

## 2015-12-08 DIAGNOSIS — Z1159 Encounter for screening for other viral diseases: Secondary | ICD-10-CM

## 2015-12-08 DIAGNOSIS — R7303 Prediabetes: Secondary | ICD-10-CM

## 2015-12-08 DIAGNOSIS — I10 Essential (primary) hypertension: Secondary | ICD-10-CM | POA: Diagnosis not present

## 2015-12-08 LAB — LIPID PANEL
Cholesterol: 169 mg/dL (ref 0–200)
HDL: 75.2 mg/dL (ref >39.00)
LDL Cholesterol: 80 mg/dL (ref 0–99)
NONHDL: 94.26
TRIGLYCERIDES: 69 mg/dL (ref 0.0–149.0)
Total CHOL/HDL Ratio: 2
VLDL: 13.8 mg/dL (ref 0.0–40.0)

## 2015-12-08 LAB — COMPREHENSIVE METABOLIC PANEL
ALT: 17 U/L (ref 0–35)
AST: 14 U/L (ref 0–37)
Albumin: 4.4 g/dL (ref 3.5–5.2)
Alkaline Phosphatase: 81 U/L (ref 39–117)
BILIRUBIN TOTAL: 0.4 mg/dL (ref 0.2–1.2)
BUN: 20 mg/dL (ref 6–23)
CO2: 30 meq/L (ref 19–32)
Calcium: 9.2 mg/dL (ref 8.4–10.5)
Chloride: 106 mEq/L (ref 96–112)
Creatinine, Ser: 0.85 mg/dL (ref 0.40–1.20)
GFR: 88.93 mL/min (ref >60.00)
GLUCOSE: 107 mg/dL — AB (ref 70–99)
POTASSIUM: 3.8 meq/L (ref 3.5–5.1)
SODIUM: 142 meq/L (ref 135–145)
TOTAL PROTEIN: 7.8 g/dL (ref 6.0–8.3)

## 2015-12-08 LAB — CBC WITH DIFFERENTIAL/PLATELET
BASOS ABS: 0 10*3/uL (ref 0.0–0.1)
BASOS PCT: 0.5 % (ref 0.0–3.0)
EOS ABS: 0.2 10*3/uL (ref 0.0–0.7)
Eosinophils Relative: 2.2 % (ref 0.0–5.0)
HEMATOCRIT: 39.6 % (ref 36.0–46.0)
Hemoglobin: 12.8 g/dL (ref 12.0–15.0)
LYMPHS ABS: 3.3 10*3/uL (ref 0.7–4.0)
LYMPHS PCT: 46 % (ref 12.0–46.0)
MCHC: 32.2 g/dL (ref 30.0–36.0)
MCV: 84.7 fl (ref 78.0–100.0)
MONOS PCT: 8.8 % (ref 3.0–12.0)
Monocytes Absolute: 0.6 10*3/uL (ref 0.1–1.0)
NEUTROS ABS: 3 10*3/uL (ref 1.4–7.7)
NEUTROS PCT: 42.5 % — AB (ref 43.0–77.0)
PLATELETS: 59 10*3/uL — AB (ref 150.0–400.0)
RBC: 4.68 Mil/uL (ref 3.87–5.11)
RDW: 16.2 % — AB (ref 11.5–15.5)
WBC: 7.2 10*3/uL (ref 4.0–10.5)

## 2015-12-08 LAB — HEMOGLOBIN A1C: Hgb A1c MFr Bld: 5.9 % (ref 4.6–6.5)

## 2015-12-08 NOTE — Telephone Encounter (Signed)
-----   Message from Ellamae Sia sent at 11/30/2015  2:20 PM EST ----- Regarding: Lab orders for Tuesday, 1.17.17 Patient is scheduled for CPX labs, please order future labs, Thanks , Karna Christmas

## 2015-12-09 ENCOUNTER — Other Ambulatory Visit: Payer: Self-pay

## 2015-12-09 DIAGNOSIS — Z1231 Encounter for screening mammogram for malignant neoplasm of breast: Secondary | ICD-10-CM

## 2015-12-09 LAB — HEPATITIS C ANTIBODY: HCV AB: NEGATIVE

## 2015-12-14 ENCOUNTER — Ambulatory Visit
Admission: RE | Admit: 2015-12-14 | Discharge: 2015-12-14 | Disposition: A | Discharge status: Home / Self Care | Payer: BLUE CROSS/BLUE SHIELD | Source: Ambulatory Visit

## 2015-12-14 DIAGNOSIS — Z1231 Encounter for screening mammogram for malignant neoplasm of breast: Secondary | ICD-10-CM

## 2015-12-15 ENCOUNTER — Encounter: Payer: Self-pay | Admitting: Family Medicine

## 2015-12-15 ENCOUNTER — Ambulatory Visit (INDEPENDENT_AMBULATORY_CARE_PROVIDER_SITE_OTHER): Payer: BLUE CROSS/BLUE SHIELD | Admitting: Family Medicine

## 2015-12-15 VITALS — BP 140/80 | HR 76 | Temp 98.4°F | Ht 63.5 in | Wt 197.5 lb

## 2015-12-15 DIAGNOSIS — R7303 Prediabetes: Secondary | ICD-10-CM

## 2015-12-15 DIAGNOSIS — D696 Thrombocytopenia, unspecified: Secondary | ICD-10-CM | POA: Diagnosis not present

## 2015-12-15 DIAGNOSIS — Z0000 Encounter for general adult medical examination without abnormal findings: Secondary | ICD-10-CM | POA: Diagnosis not present

## 2015-12-15 DIAGNOSIS — R0789 Other chest pain: Secondary | ICD-10-CM

## 2015-12-15 DIAGNOSIS — Z1211 Encounter for screening for malignant neoplasm of colon: Secondary | ICD-10-CM

## 2015-12-15 DIAGNOSIS — I10 Essential (primary) hypertension: Secondary | ICD-10-CM | POA: Diagnosis not present

## 2015-12-15 DIAGNOSIS — R002 Palpitations: Secondary | ICD-10-CM | POA: Diagnosis not present

## 2015-12-15 MED ORDER — LOSARTAN POTASSIUM-HCTZ 50-12.5 MG PO TABS: 1.0000 | ORAL_TABLET | Freq: Every day | ORAL | Status: DC

## 2015-12-15 NOTE — Progress Notes (Signed)
55 year old pt who presents for annual wellness visit.    In last few weeks she has occ shortness of breath and chest pressure substernally. Off and on. Daily occuring. Occ skipping heart beat, fluttering. No association with exertion. Shortness of breath a little worse with walking.  Hypertension:    Elevated today off lisinopril HCTZ. Also elevated at last OV here for back pain. BP Readings from Last 3 Encounters:  12/15/15 140/80  11/05/15 152/102  09/09/15 160/86  Using medication without problems or lightheadedness:  none Chest pain with exertion: none Edema:none Short of breath:yes Average home BPs: Other issues: Wt Readings from Last 3 Encounters:  12/15/15 197 lb 8 oz (89.585 kg)  11/05/15 198 lb 1.9 oz (89.867 kg)  09/09/15 198 lb 4.8 oz (89.948 kg)    Prediabetes  Stable control. Lab Results  Component Value Date   HGBA1C 5.9 12/08/2015    Cholesterol at goal LDL < 130 on no med. Lab Results  Component Value Date   CHOL 169 12/08/2015   HDL 75.20 12/08/2015   LDLCALC 80 12/08/2015   TRIG 69.0 12/08/2015   CHOLHDL 2 12/08/2015   Exercise :minimal  Diet: Healthy, planning restarting weight watchers.  Rheumatoid arthritis: on methotrexate. Stable joint pain. Follow up this week.     Thrombocytopenia: Felt to be ITP versus secondary to methotrexate per HEME in 2016 paltelet 59 in 11/2015. May have to have prednisone pack.   Social History /Family History/Past Medical History reviewed and updated if needed.    Review of Systems  Constitutional: Negative for fever and fatigue.  HENT: Negative for ear pain.  Eyes: Negative for pain.  Respiratory: Negative for chest tightness and shortness of breath.  Cardiovascular: Negative for chest pain, palpitations and leg swelling.  Gastrointestinal: Negative for abdominal pain.  Genitourinary: Negative for dysuria.      Objective:  Physical Exam  Constitutional: Vital signs are normal. She  appears well-developed and well-nourished. She is cooperative. Non-toxic appearance. She does not appear ill. No distress.  HENT:  Head: Normocephalic.  Right Ear: Hearing, tympanic membrane, external ear and ear canal normal. Tympanic membrane is not erythematous, not retracted and not bulging.  Left Ear: Hearing, tympanic membrane, external ear and ear canal normal. Tympanic membrane is not erythematous, not retracted and not bulging.  Nose: Nose normal. No mucosal edema or rhinorrhea. Right sinus exhibits no maxillary sinus tenderness and no frontal sinus tenderness. Left sinus exhibits no maxillary sinus tenderness and no frontal sinus tenderness.  Mouth/Throat: Uvula is midline, oropharynx is clear and moist and mucous membranes are normal.  Eyes: Conjunctivae, EOM and lids are normal. Pupils are equal, round, and reactive to light. Lids are everted and swept, no foreign bodies found.  Neck: Trachea normal and normal range of motion. Neck supple. Carotid bruit is not present. No mass and no thyromegaly present.  Cardiovascular: Normal rate, regular rhythm, S1 normal, S2 normal, normal heart sounds, intact distal pulses and normal pulses. Exam reveals no gallop and no friction rub.  No murmur heard. Pulmonary/Chest: Effort normal and breath sounds normal. Not tachypneic. No respiratory distress. She has no decreased breath sounds. She has no wheezes. She has no rhonchi. She has no rales.  Abdominal: Soft. Normal appearance and bowel sounds are normal. She exhibits no distension, no fluid wave, no abdominal bruit and no mass. There is no hepatosplenomegaly. There is no tenderness. There is no rebound, no guarding and no CVA tenderness. No hernia.  Genitourinary: Vagina normal  and uterus normal. No breast swelling, tenderness, discharge or bleeding. Pelvic exam was performed with patient prone. There is no rash, tenderness or lesion on the right labia. There is no rash, tenderness or  lesion on the left labia. Uterus is not enlarged and not tender. Right adnexum displays no mass, no tenderness and no fullness. Left adnexum displays no mass, no tenderness and no fullness.  PAP NOT PERFORMED Lymphadenopathy:   She has no cervical adenopathy.   She has no axillary adenopathy.  Neurological: She is alert. She has normal strength. No cranial nerve deficit or sensory deficit.  Skin: Skin is warm, dry and intact. No rash noted.  Psychiatric: Her speech is normal and behavior is normal. Judgment and thought content normal. Her mood appears not anxious. Cognition and memory are normal. She does not exhibit a depressed mood.         Assessment & Plan:  The patient's preventative maintenance and recommended screening tests for an annual wellness exam were reviewed in full today. Brought up to date unless services declined.  Counselled on the importance of diet, exercise, and its role in overall health and mortality. The patient's FH and SH was reviewed, including their home life, tobacco status, and drug and alcohol status.   Mammo: nml 12/14/2015 Colon: Never had colonoscopy, no family hx of colon cancer. She is interested in referral to GI for colonoscpy. Former smoker minimal use, 3 pack years, remote Vaccines: flu refused and tdap 2010 2013, 2014, 2015 nml pap,  Space to every 3 years, yearly DVE. Mother died with uterine cancer.   Hep C neg  HIV: refused

## 2015-12-15 NOTE — Assessment & Plan Note (Signed)
Low carb diet 

## 2015-12-15 NOTE — Assessment & Plan Note (Signed)
Inadequate control off medication. Start losartan HCTZ ( lisinopril gave cough)

## 2015-12-15 NOTE — Assessment & Plan Note (Signed)
Low risk, low chol, nonsmoker. Atypical .  EKG unremarkable.  ? related to low platelets. Nml treadmil stress test 2 years ago.  If continuing consider further eval.

## 2015-12-15 NOTE — Progress Notes (Signed)
Pre visit review using our clinic review tool, if applicable. No additional management support is needed unless otherwise documented below in the visit note. 

## 2015-12-15 NOTE — Patient Instructions (Addendum)
Work on low carb diet, and increase exercise as able. Stop at front desk for referral  to GI. Start losartan HCTZ daily. Follow BP at home, call if > 140/90. Decrease caffeine in diet, increase water. Call if chest tightness and shortness of breath or palpitaitons not improving.

## 2015-12-15 NOTE — Assessment & Plan Note (Signed)
Increase water and decrease caffeine. Consider holter if continuing. Encouraged exercise, weight loss, healthy eating habits.

## 2015-12-15 NOTE — Assessment & Plan Note (Signed)
Lower. Will likely need prednisone taper.

## 2015-12-28 ENCOUNTER — Ambulatory Visit (INDEPENDENT_AMBULATORY_CARE_PROVIDER_SITE_OTHER): Payer: BLUE CROSS/BLUE SHIELD | Admitting: Primary Care

## 2015-12-28 ENCOUNTER — Encounter: Payer: Self-pay | Admitting: Primary Care

## 2015-12-28 VITALS — BP 142/82 | HR 85 | Temp 98.0°F | Ht 63.5 in | Wt 194.8 lb

## 2015-12-28 DIAGNOSIS — R059 Cough, unspecified: Secondary | ICD-10-CM

## 2015-12-28 DIAGNOSIS — R05 Cough: Secondary | ICD-10-CM | POA: Diagnosis not present

## 2015-12-28 MED ORDER — BENZONATATE 200 MG PO CAPS: 200.0000 mg | ORAL_CAPSULE | Freq: Three times a day (TID) | ORAL | Status: DC | PRN

## 2015-12-28 MED ORDER — AMOXICILLIN-POT CLAVULANATE 875-125 MG PO TABS: 1.0000 | ORAL_TABLET | Freq: Two times a day (BID) | ORAL | Status: DC

## 2015-12-28 NOTE — Patient Instructions (Signed)
Start Augmentin antibiotics. Take 1 tablet by mouth twice daily for 7 days.  You may take Benzonatate capsules for cough. Take 1 capsule by mouth three times daily as needed for cough.  You may try taking Nyquil at bedtime for sleep and night cough if no improvement with the Benzonatate capsules.  Ensure you are staying hydrated with water.  Please give [place] a call if no improvement in 3-4 days.  It was a pleasure meeting you!  Acute Bronchitis Bronchitis is inflammation of the airways that extend from the windpipe into the lungs (bronchi). The inflammation often causes mucus to develop. This leads to a cough, which is the most common symptom of bronchitis.  In acute bronchitis, the condition usually develops suddenly and goes away over time, usually in a couple weeks. Smoking, allergies, and asthma can make bronchitis worse. Repeated episodes of bronchitis may cause further lung problems.  CAUSES Acute bronchitis is most often caused by the same virus that causes a cold. The virus can spread from person to person (contagious) through coughing, sneezing, and touching contaminated objects. SIGNS AND SYMPTOMS   Cough.   Fever.   Coughing up mucus.   Body aches.   Chest congestion.   Chills.   Shortness of breath.   Sore throat.  DIAGNOSIS  Acute bronchitis is usually diagnosed through a physical exam. Your health care provider will also ask you questions about your medical history. Tests, such as chest X-rays, are sometimes done to rule out other conditions.  TREATMENT  Acute bronchitis usually goes away in a couple weeks. Oftentimes, no medical treatment is necessary. Medicines are sometimes given for relief of fever or cough. Antibiotic medicines are usually not needed but may be prescribed in certain situations. In some cases, an inhaler may be recommended to help reduce shortness of breath and control the cough. A cool mist vaporizer may also be used to help thin  bronchial secretions and make it easier to clear the chest.  HOME CARE INSTRUCTIONS  Get plenty of rest.   Drink enough fluids to keep your urine clear or pale yellow (unless you have a medical condition that requires fluid restriction). Increasing fluids may help thin your respiratory secretions (sputum) and reduce chest congestion, and it will prevent dehydration.   Take medicines only as directed by your health care provider.  If you were prescribed an antibiotic medicine, finish it all even if you start to feel better.  Avoid smoking and secondhand smoke. Exposure to cigarette smoke or irritating chemicals will make bronchitis worse. If you are a smoker, consider using nicotine gum or skin patches to help control withdrawal symptoms. Quitting smoking will help your lungs heal faster.   Reduce the chances of another bout of acute bronchitis by washing your hands frequently, avoiding people with cold symptoms, and trying not to touch your hands to your mouth, nose, or eyes.   Keep all follow-up visits as directed by your health care provider.  SEEK MEDICAL CARE IF: Your symptoms do not improve after 1 week of treatment.  SEEK IMMEDIATE MEDICAL CARE IF:  You develop an increased fever or chills.   You have chest pain.   You have severe shortness of breath.  You have bloody sputum.   You develop dehydration.  You faint or repeatedly feel like you are going to pass out.  You develop repeated vomiting.  You develop a severe headache. MAKE SURE YOU:   Understand these instructions.  Will watch your condition.  Will  get help right away if you are not doing well or get worse.   This information is not intended to replace advice given to you by your health care provider. Make sure you discuss any questions you have with your health care provider.   Document Released: 12/15/2004 Document Revised: 11/28/2014 Document Reviewed: 04/30/2013 Elsevier Interactive Patient  Education [employer].

## 2015-12-28 NOTE — Progress Notes (Signed)
Pre visit review using our clinic review tool, if applicable. No additional management support is needed unless otherwise documented below in the visit note. 

## 2015-12-28 NOTE — Progress Notes (Signed)
Subjective:    Patient ID: Rachel Rivas, female    DOB: 13-Oct-1961, 55 y.o.   MRN: [id number]  HPI  Rachel Rivas is a 55 year old female who presents today with a chief complaint of cough. She also reports wheezing, chills, sore throat. Her symptoms have been present for the past 2 weeks. She's taken alka-selzer plus, mucinex, and EmergenC without improvement. Overall her symptoms have become worse with increased fatigue and cough. Her cough is productive occasionally.   Review of Systems  Constitutional: Positive for fever and chills.  HENT: Positive for sore throat. Negative for congestion, ear pain, rhinorrhea and sinus pressure.   Respiratory: Positive for cough and shortness of breath. Negative for wheezing.   Cardiovascular: Negative for chest pain.       Past Medical History  Diagnosis Date  . Hypertension   . Borderline diabetic     Social History   Social History  . Marital Status: Married    Spouse Name: N/A  . Number of Children: 2  . 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 Smoking status: Former Smoker -- 0.25 packs/day for 12 years    Types: Cigarettes  . Smokeless tobacco: Former Systems developer    Quit date: 11/21/1993  . Alcohol Use: 0.0 oz/week    0 Standard drinks or equivalent per week     Comment: occasional  . Drug Use: No     Comment: past; marijuana  . Sexual Activity: Not on file   Other Topics Concern  . Not on file   Social History Narrative   Moved here from [place] two years ago.      Works for [employer].             Past Surgical History  Procedure Laterality Date  . Cesarean section    . Sterilization      Family History  Problem Relation Age of Onset  . Cancer Mother     Uterine/lung  . Hypertension Mother   . Hypertension Father   . Diabetes Father   . Diabetes Mother     Allergies  Allergen Reactions  . Latex Itching, Dermatitis and  Rash    Current Outpatient Prescriptions on File Prior to Visit  Medication Sig Dispense Refill  . FOLIC ACID PO Take 1 mg by mouth 2 (two) times daily.    Marland Kitchen losartan-hydrochlorothiazide (HYZAAR) 50-12.5 MG tablet Take 1 tablet by mouth daily. 30 tablet 11  . methotrexate (RHEUMATREX) 2.5 MG tablet Take 8 tablets by mouth once a week.    . mometasone (NASONEX) 50 MCG/ACT nasal spray Place 2 sprays into the nose daily. (Patient not taking: Reported on 12/28/2015) 7.5 g 0   No current facility-administered medications on file prior to visit.    BP 142/82 mmHg  Pulse 85  Temp(Src) 98 F (36.7 C) (Oral)  Ht 5' 3.5" (1.613 m)  Wt 194 lb 12.8 oz (88.361 kg)  BMI 33.96 kg/m2  SpO2 96%  LMP 11/17/2012    Objective:   Physical Exam  Constitutional: She appears well-nourished.  HENT:  Right Ear: Tympanic membrane and ear canal normal.  Left Ear: Tympanic membrane and ear canal normal.  Nose: Right sinus exhibits maxillary sinus tenderness. Right sinus exhibits no frontal sinus tenderness. Left sinus exhibits maxillary sinus tenderness. Left sinus exhibits no frontal sinus tenderness.  Mouth/Throat: Oropharynx is clear and moist.  Eyes: Conjunctivae are  normal.  Neck: Neck supple.  Cardiovascular: Normal rate and regular rhythm.   Pulmonary/Chest: She has no wheezes. She has rhonchi in the right upper field, the right lower field, the left upper field and the left lower field.  Lymphadenopathy:    She has no cervical adenopathy.  Skin: Skin is warm and dry.          Assessment & Plan:  Acute Bronchitis:  Cough, fatigue, low grade fevers x 2 weeks. Cough and fatigue now worse. No improvement with OTC meds. Exam with rhonchi throughout. Due to duration and examination will treat for presumed bacterial involvement. Rx for Augmentin course and tessalon pearls sent to pharmacy. Fluids, rest, return precautions provided.

## 2015-12-30 ENCOUNTER — Encounter: Payer: Self-pay | Admitting: Gastroenterology

## 2016-02-08 ENCOUNTER — Ambulatory Visit (AMBULATORY_SURGERY_CENTER): Payer: Self-pay | Admitting: *Deleted

## 2016-02-08 VITALS — Ht 63.0 in | Wt 198.8 lb

## 2016-02-08 DIAGNOSIS — Z1211 Encounter for screening for malignant neoplasm of colon: Secondary | ICD-10-CM

## 2016-02-08 NOTE — Progress Notes (Signed)
No egg or soy allergy known to patient  No issues with past sedation with any surgeries  or procedures, no intubation problems  No diet pills per patient No home 02 use per patient  No blood thinners per patient  Pt denies issues with constipation  emmi declined Pt has [employer] advantage so did miralax gatorade prep

## 2016-02-22 ENCOUNTER — Ambulatory Visit (AMBULATORY_SURGERY_CENTER): Payer: BLUE CROSS/BLUE SHIELD | Admitting: Gastroenterology

## 2016-02-22 ENCOUNTER — Encounter: Payer: Self-pay | Admitting: Gastroenterology

## 2016-02-22 VITALS — BP 124/75 | HR 64 | Temp 98.6°F | Resp 15 | Ht 63.0 in | Wt 198.0 lb

## 2016-02-22 DIAGNOSIS — D122 Benign neoplasm of ascending colon: Secondary | ICD-10-CM

## 2016-02-22 DIAGNOSIS — Z1211 Encounter for screening for malignant neoplasm of colon: Secondary | ICD-10-CM

## 2016-02-22 HISTORY — PX: COLONOSCOPY: SHX174

## 2016-02-22 MED ORDER — SODIUM CHLORIDE 0.9 % IV SOLN: 500.0000 mL | INTRAVENOUS | Status: DC

## 2016-02-22 NOTE — Progress Notes (Signed)
Report to PACU, RN, vss, BBS= Clear.  

## 2016-02-22 NOTE — Patient Instructions (Signed)
YOU HAD AN ENDOSCOPIC PROCEDURE TODAY AT [facility]:   Refer to the procedure report that was given to you for any specific questions about what was found during the examination.  If the procedure report does not answer your questions, please call your gastroenterologist to clarify.  If you requested that your care partner not be given the details of your procedure findings, then the procedure report has been included in a sealed envelope for you to review at your convenience later.  YOU SHOULD EXPECT: Some feelings of bloating in the abdomen. Passage of more gas than usual.  Walking can help get rid of the air that was put into your GI tract during the procedure and reduce the bloating. If you had a lower endoscopy (such as a colonoscopy or flexible sigmoidoscopy) you may notice spotting of blood in your stool or on the toilet paper. If you underwent a bowel prep for your procedure, you may not have a normal bowel movement for a few days.  Please Note:  You might notice some irritation and congestion in your nose or some drainage.  This is from the oxygen used during your procedure.  There is no need for concern and it should clear up in a day or so.  SYMPTOMS TO REPORT IMMEDIATELY:   Following lower endoscopy (colonoscopy or flexible sigmoidoscopy):  Excessive amounts of blood in the stool  Significant tenderness or worsening of abdominal pains  Swelling of the abdomen that is new, acute  Fever of 100F or higher  For urgent or emergent issues, a gastroenterologist can be reached at any hour by calling [phone].   DIET: Your first meal following the procedure should be a small meal and then it is ok to progress to your normal diet. Heavy or fried foods are harder to digest and may make you feel nauseous or bloated.  Likewise, meals heavy in dairy and vegetables can increase bloating.  Drink plenty of fluids but you should avoid alcoholic beverages for 24  hours.  ACTIVITY:  You should plan to take it easy for the rest of today and you should NOT DRIVE or use heavy machinery until tomorrow (because of the sedation medicines used during the test).    FOLLOW UP: Our staff will call the number listed on your records the next business day following your procedure to check on you and address any questions or concerns that you may have regarding the information given to you following your procedure. If we do not reach you, we will leave a message.  However, if you are feeling well and you are not experiencing any problems, there is no need to return our call.  We will assume that you have returned to your regular daily activities without incident.  If any biopsies were taken you will be contacted by phone or by letter within the next 1-3 weeks.  Please call us at [phone] if you have not heard about the biopsies in 3 weeks.    SIGNATURES/CONFIDENTIALITY: You and/or your care partner have signed paperwork which will be entered into your electronic medical record.  These signatures attest to the fact that that the information above on your After Visit Summary has been reviewed and is understood.  Full responsibility of the confidentiality of this discharge information lies with you and/or your care-partner.    Handouts were given to your care partner on polyps. Per Dr. Havery Moros please continue taking PREDNISONE for history of ITP. Please hold aspirin,  aspirin products and any NSAIDs for 2 weeks. You may resume your other current medications today. Await biopsy results. Please call if any questions or concerns.

## 2016-02-22 NOTE — Progress Notes (Signed)
Called to room to assist during endoscopic procedure.  Patient ID and intended procedure confirmed with present staff. Received instructions for my participation in the procedure from the performing physician.  

## 2016-02-22 NOTE — Progress Notes (Signed)
No problems noted in the recovery room. maw 

## 2016-02-22 NOTE — Progress Notes (Deleted)
YOU HAD AN ENDOSCOPIC PROCEDURE TODAY AT [facility]:   Refer to the procedure report that was given to you for any specific questions about what was found during the examination.  If the procedure report does not answer your questions, please call your gastroenterologist to clarify.  If you requested that your care partner not be given the details of your procedure findings, then the procedure report has been included in a sealed envelope for you to review at your convenience later.  YOU SHOULD EXPECT: Some feelings of bloating in the abdomen. Passage of more gas than usual.  Walking can help get rid of the air that was put into your GI tract during the procedure and reduce the bloating. If you had a lower endoscopy (such as a colonoscopy or flexible sigmoidoscopy) you may notice spotting of blood in your stool or on the toilet paper. If you underwent a bowel prep for your procedure, you may not have a normal bowel movement for a few days.  Please Note:  You might notice some irritation and congestion in your nose or some drainage.  This is from the oxygen used during your procedure.  There is no need for concern and it should clear up in a day or so.  SYMPTOMS TO REPORT IMMEDIATELY:   Following lower endoscopy (colonoscopy or flexible sigmoidoscopy):  Excessive amounts of blood in the stool  Significant tenderness or worsening of abdominal pains  Swelling of the abdomen that is new, acute  Fever of 100F or higher   For urgent or emergent issues, a gastroenterologist can be reached at any hour by calling [phone].   DIET: Your first meal following the procedure should be a small meal and then it is ok to progress to your normal diet. Heavy or fried foods are harder to digest and may make you feel nauseous or bloated.  Likewise, meals heavy in dairy and vegetables can increase bloating.  Drink plenty of fluids but you should avoid alcoholic beverages for 24  hours.  ACTIVITY:  You should plan to take it easy for the rest of today and you should NOT DRIVE or use heavy machinery until tomorrow (because of the sedation medicines used during the test).    FOLLOW UP: Our staff will call the number listed on your records the next business day following your procedure to check on you and address any questions or concerns that you may have regarding the information given to you following your procedure. If we do not reach you, we will leave a message.  However, if you are feeling well and you are not experiencing any problems, there is no need to return our call.  We will assume that you have returned to your regular daily activities without incident.  If any biopsies were taken you will be contacted by phone or by letter within the next 1-3 weeks.  Please call us at [phone] if you have not heard about the biopsies in 3 weeks.    SIGNATURES/CONFIDENTIALITY: You and/or your care partner have signed paperwork which will be entered into your electronic medical record.  These signatures attest to the fact that that the information above on your After Visit Summary has been reviewed and is understood.  Full responsibility of the confidentiality of this discharge information lies with you and/or your care-partner.    Handout was given to your care partner on polyps. Per Dr. Havery Moros continue taking PREDNISONE due to history of ITP. Please hold  aspirin, aspirin products or any NSAIDS medications for 2 weeks. You may resume your other current medications today. Await biopsy results. Please call if any questions or concerns.

## 2016-02-22 NOTE — Op Note (Signed)
[facility] Patient Name: Rachel Rivas Procedure Date: 02/22/2016 11:29 AM MRN: [id number] Endoscopist: Remo Lipps P. Havery Moros , MD Age: 55 Referring MD:  Date of Birth: 01-Aug-1961 Gender: Female Procedure:                Colonoscopy Indications:              Screening for colorectal malignant neoplasm Medicines:                Monitored Anesthesia Care Procedure:                Pre-Anesthesia Assessment:                           - Prior to the procedure, a History and Physical                            was performed, and patient medications and                            allergies were reviewed. The patient's tolerance of                            previous anesthesia was also reviewed. The risks                            and benefits of the procedure and the sedation                            options and risks were discussed with the patient.                            All questions were answered, and informed consent                            was obtained. Prior Anticoagulants: The patient has                            taken no previous anticoagulant or antiplatelet                            agents. ASA Grade Assessment: II - A patient with                            mild systemic disease. After reviewing the risks                            and benefits, the patient was deemed in                            satisfactory condition to undergo the procedure.                           After obtaining informed consent, the colonoscope  was passed under direct vision. Throughout the                            procedure, the patient's blood pressure, pulse, and                            oxygen saturations were monitored continuously. The                            Model PCF-H190L [id number]) scope was introduced                            through the anus and advanced to the the cecum,                            identified by appendiceal orifice  and ileocecal                            valve. The colonoscopy was performed without                            difficulty. The patient tolerated the procedure                            well. The quality of the bowel preparation was                            adequate. The ileocecal valve, appendiceal orifice,                            and rectum were photographed. Scope In: 11:38:17 AM Scope Out: 11:56:36 AM Scope Withdrawal Time: 0 hours 12 minutes 57 seconds  Total Procedure Duration: 0 hours 18 minutes 19 seconds  Findings:      The perianal and digital rectal examinations were normal.      A 5 mm polyp was found in the ascending colon. The polyp was sessile.       The polyp was removed with a cold snare. Resection and retrieval were       complete.      The exam was otherwise normal throughout the examined colon. Complications:            No immediate complications. Estimated blood loss:                            Minimal. Estimated Blood Loss:     Estimated blood loss was minimal. Impression:               - One 5 mm polyp in the ascending colon, removed                            with a cold snare. Resected and retrieved. Recommendation:           - Patient has a contact number available for  emergencies. The signs and symptoms of potential                            delayed complications were discussed with the                            patient. Return to normal activities tomorrow.                            Written discharge instructions were provided to the                            patient.                           - Resume previous diet.                           - Continue present medications, including                            prednisone for history of ITP                           - No aspirin, ibuprofen, naproxen, or other                            non-steroidal anti-inflammatory drugs for 2 weeks                            after  polyp removal.                           - Await pathology results.                           - Repeat colonoscopy is recommended for                            surveillance. The colonoscopy date will be                            determined after pathology results from today's                            exam become available for review. Procedure Code(s):        --- Professional ---                           [phone], Colonoscopy, flexible; with removal of                            tumor(s), polyp(s), or other lesion(s) by snare                            technique CPT copyright 2016 [employer]. All rights reserved.  Remo Lipps P. Makynlie Rossini, MD 02/22/2016 11:59:40 AM This report has been signed electronically. Number of Addenda: 0 Referring MD:      Collie Siad. Vira Agar, MD

## 2016-02-22 NOTE — Progress Notes (Signed)
Pt to the rest room.  She was dizzy.  Her daughter was at her side in the rest room.  Maw  No further complaints noted in the recovery room. maw

## 2016-02-23 ENCOUNTER — Telehealth: Payer: Self-pay

## 2016-02-23 NOTE — Telephone Encounter (Signed)
  Follow up Call-  Call back number 02/22/2016  Post procedure Call Back phone  # [phone]  Permission to leave phone message Yes     Patient questions:  Do you have a fever, pain , or abdominal swelling? No. Pain Score  0 *  Have you tolerated food without any problems? Yes.    Have you been able to return to your normal activities? Yes.    Do you have any questions about your discharge instructions: Diet   No. Medications  No. Follow up visit  No.  Do you have questions or concerns about your Care? No.  Actions: * If pain score is 4 or above: No action needed, pain <4.

## 2016-02-26 ENCOUNTER — Encounter: Payer: Self-pay | Admitting: Gastroenterology

## 2016-03-15 ENCOUNTER — Ambulatory Visit (INDEPENDENT_AMBULATORY_CARE_PROVIDER_SITE_OTHER): Payer: BLUE CROSS/BLUE SHIELD | Admitting: Family Medicine

## 2016-03-15 ENCOUNTER — Encounter: Payer: Self-pay | Admitting: Family Medicine

## 2016-03-15 VITALS — BP 104/70 | HR 80 | Temp 98.2°F | Ht 63.5 in | Wt 194.8 lb

## 2016-03-15 DIAGNOSIS — M5442 Lumbago with sciatica, left side: Secondary | ICD-10-CM | POA: Insufficient documentation

## 2016-03-15 DIAGNOSIS — R002 Palpitations: Secondary | ICD-10-CM | POA: Diagnosis not present

## 2016-03-15 DIAGNOSIS — R0789 Other chest pain: Secondary | ICD-10-CM | POA: Diagnosis not present

## 2016-03-15 DIAGNOSIS — M5441 Lumbago with sciatica, right side: Secondary | ICD-10-CM | POA: Diagnosis not present

## 2016-03-15 DIAGNOSIS — I10 Essential (primary) hypertension: Secondary | ICD-10-CM | POA: Diagnosis not present

## 2016-03-15 MED ORDER — TRAMADOL HCL 50 MG PO TABS: 50.0000 mg | ORAL_TABLET | Freq: Three times a day (TID) | ORAL | Status: DC | PRN

## 2016-03-15 MED ORDER — PREDNISONE 20 MG PO TABS: ORAL_TABLET | ORAL | Status: DC

## 2016-03-15 MED ORDER — LOSARTAN POTASSIUM-HCTZ 50-12.5 MG PO TABS: 1.0000 | ORAL_TABLET | Freq: Every day | ORAL | Status: DC

## 2016-03-15 NOTE — Progress Notes (Signed)
Pre visit review using our clinic review tool, if applicable. No additional management support is needed unless otherwise documented below in the visit note. 

## 2016-03-15 NOTE — Assessment & Plan Note (Signed)
Well controlled. Continue current medication. Encouraged exercise, weight loss, healthy eating habits.  

## 2016-03-15 NOTE — Patient Instructions (Signed)
Continue losartan HCTZ. Heat and low back stretches per info packet. Start prednisone taper and use muscle relaxant at night as needed.  Can use tramadol for breakthrough pain.  Call for likely MRI eval if not improving as expected in 2 weeks.

## 2016-03-15 NOTE — Progress Notes (Signed)
Subjective:    Patient ID: Rachel Rivas, female    DOB: Jan 03, 1961, 55 y.o.   MRN: [id number]  HPI   55 year old female presents for 3 month follow up of HTN and palpitations.  She also presents for acute onset low back pain.  Hypertension:    Well controlled on losartan HCTZ  BP Readings from Last 3 Encounters:  03/15/16 104/70  02/22/16 124/75  12/28/15 142/82  Using medication without problems or lightheadedness: None Chest pain with exertion:None. Edema: None Short of breath: None Average home BPs:not checking regularly. Other issues:  Palpitations: At last OV , EKG unremarkabletold to increase water and decrease caffeine.   No further episodes, no further chest tightness.  Acute low back pain: Started when she bent over getting clothes out of the dryer. Felt sudden pain in low back. Had to lie down in bed, could not walk on her own, pain with sitting or standing. She took a muscle relaxer, has improved gradually some but not much. Has not used any other medication. Now sore over mid low back and on right. Right leg sore and feels weak. Using cane. No numbness.  No associated fall.   Had similar pain in 10/2015 Xray 2016 reviewed: Increased disc space loss at L4-5 consistent with degenerative disc disease. There is mild to moderate facet joint hypertrophy at L4-5 and at L5-S1. There is no compression fracture or spondylolisthesis   Social History /Family History/Past Medical History reviewed and updated if needed. Hx of rheumatoid arthritis. On methotrexate. Only using pred 5 mg as needed for low platelets. Review of Systems  Constitutional: Negative for fever and fatigue.  HENT: Negative for ear pain.   Eyes: Negative for pain.  Respiratory: Negative for chest tightness and shortness of breath.   Cardiovascular: Negative for chest pain, palpitations and leg swelling.  Gastrointestinal: Negative for abdominal pain.  Genitourinary: Negative for dysuria.      Objective:   Physical Exam  Constitutional: Vital signs are normal. She appears well-developed and well-nourished. She is cooperative.  Non-toxic appearance. She does not appear ill. No distress.  HENT:  Head: Normocephalic.  Right Ear: Hearing, tympanic membrane, external ear and ear canal normal. Tympanic membrane is not erythematous, not retracted and not bulging.  Left Ear: Hearing, tympanic membrane, external ear and ear canal normal. Tympanic membrane is not erythematous, not retracted and not bulging.  Nose: No mucosal edema or rhinorrhea. Right sinus exhibits no maxillary sinus tenderness and no frontal sinus tenderness. Left sinus exhibits no maxillary sinus tenderness and no frontal sinus tenderness.  Mouth/Throat: Uvula is midline, oropharynx is clear and moist and mucous membranes are normal.  Eyes: Conjunctivae, EOM and lids are normal. Pupils are equal, round, and reactive to light. Lids are everted and swept, no foreign bodies found.  Neck: Trachea normal and normal range of motion. Neck supple. Carotid bruit is not present. No thyroid mass and no thyromegaly present.  Cardiovascular: Normal rate, regular rhythm, S1 normal, S2 normal, normal heart sounds, intact distal pulses and normal pulses.  Exam reveals no gallop and no friction rub.   No murmur heard. Pulmonary/Chest: Effort normal and breath sounds normal. No tachypnea. No respiratory distress. She has no decreased breath sounds. She has no wheezes. She has no rhonchi. She has no rales.  Abdominal: Soft. Normal appearance and bowel sounds are normal. There is no tenderness.  Musculoskeletal:       Lumbar back: She exhibits decreased range of motion, tenderness and bony  tenderness.  Decrease rom , cannot bend forward at all, pain with sitting.  Positive SLR on right, modified. Neg faber.  Neurological: She is alert. She has normal strength. She displays no atrophy. No sensory deficit. She exhibits normal muscle tone. Gait  abnormal.  Skin: Skin is warm, dry and intact. No rash noted.  Psychiatric: Her speech is normal and behavior is normal. Judgment and thought content normal. Her mood appears not anxious. Cognition and memory are normal. She does not exhibit a depressed mood.          Assessment & Plan:

## 2016-03-15 NOTE — Assessment & Plan Note (Signed)
Treat with pred taper, heat, gentle ROM stretches, muscle relaxant at night and tramadol for break through pain.

## 2016-03-15 NOTE — Assessment & Plan Note (Signed)
Resolved with improvement in BP.

## 2016-03-15 NOTE — Assessment & Plan Note (Signed)
Resolved with improvement in BP and decrease in caffeine.

## 2016-03-17 ENCOUNTER — Telehealth: Payer: Self-pay | Admitting: Oncology

## 2016-03-17 NOTE — Telephone Encounter (Signed)
S.w. Pt and gv appt...the patient ok and awareo f d.t

## 2016-03-23 ENCOUNTER — Telehealth: Payer: Self-pay | Admitting: Family Medicine

## 2016-03-23 DIAGNOSIS — K219 Gastro-esophageal reflux disease without esophagitis: Secondary | ICD-10-CM

## 2016-03-23 NOTE — Telephone Encounter (Signed)
Pt has been having a lot of acid on her stomach and is wanting a referral to GI  Please call [phone] Thank you

## 2016-03-23 NOTE — Telephone Encounter (Signed)
Referral sent 

## 2016-03-25 DIAGNOSIS — M5136 Other intervertebral disc degeneration, lumbar region: Secondary | ICD-10-CM | POA: Diagnosis not present

## 2016-03-25 DIAGNOSIS — D696 Thrombocytopenia, unspecified: Secondary | ICD-10-CM | POA: Diagnosis not present

## 2016-03-25 DIAGNOSIS — M542 Cervicalgia: Secondary | ICD-10-CM | POA: Diagnosis not present

## 2016-03-25 DIAGNOSIS — M0589 Other rheumatoid arthritis with rheumatoid factor of multiple sites: Secondary | ICD-10-CM | POA: Diagnosis not present

## 2016-04-06 ENCOUNTER — Other Ambulatory Visit (HOSPITAL_BASED_OUTPATIENT_CLINIC_OR_DEPARTMENT_OTHER): Payer: BLUE CROSS/BLUE SHIELD

## 2016-04-06 ENCOUNTER — Telehealth: Payer: Self-pay | Admitting: Oncology

## 2016-04-06 ENCOUNTER — Ambulatory Visit (HOSPITAL_BASED_OUTPATIENT_CLINIC_OR_DEPARTMENT_OTHER): Payer: BLUE CROSS/BLUE SHIELD | Admitting: Oncology

## 2016-04-06 VITALS — BP 140/65 | HR 86 | Temp 98.6°F | Resp 18 | Ht 63.5 in | Wt 197.9 lb

## 2016-04-06 DIAGNOSIS — D696 Thrombocytopenia, unspecified: Secondary | ICD-10-CM

## 2016-04-06 DIAGNOSIS — M069 Rheumatoid arthritis, unspecified: Secondary | ICD-10-CM | POA: Diagnosis not present

## 2016-04-06 LAB — CBC WITH DIFFERENTIAL/PLATELET
BASO%: 0.2 % (ref 0.0–2.0)
BASOS ABS: 0 10*3/uL (ref 0.0–0.1)
EOS ABS: 0.1 10*3/uL (ref 0.0–0.5)
EOS%: 2.5 % (ref 0.0–7.0)
HCT: 37.5 % (ref 34.8–46.6)
HEMOGLOBIN: 12.2 g/dL (ref 11.6–15.9)
LYMPH%: 38.9 % (ref 14.0–49.7)
MCH: 28.4 pg (ref 25.1–34.0)
MCHC: 32.5 g/dL (ref 31.5–36.0)
MCV: 87.4 fL (ref 79.5–101.0)
MONO#: 0.4 10*3/uL (ref 0.1–0.9)
MONO%: 7.3 % (ref 0.0–14.0)
NEUT#: 2.9 10*3/uL (ref 1.5–6.5)
NEUT%: 51.1 % (ref 38.4–76.8)
Platelets: 48 10*3/uL — ABNORMAL LOW (ref 145–400)
RBC: 4.29 10*6/uL (ref 3.70–5.45)
RDW: 15.4 % — ABNORMAL HIGH (ref 11.2–14.5)
WBC: 5.7 10*3/uL (ref 3.9–10.3)
lymph#: 2.2 10*3/uL (ref 0.9–3.3)

## 2016-04-06 LAB — COMPREHENSIVE METABOLIC PANEL
ALBUMIN: 3.9 g/dL (ref 3.5–5.0)
ALK PHOS: 73 U/L (ref 40–150)
ALT: 24 U/L (ref 0–55)
AST: 15 U/L (ref 5–34)
Anion Gap: 6 mEq/L (ref 3–11)
BUN: 13.8 mg/dL (ref 7.0–26.0)
CALCIUM: 9.5 mg/dL (ref 8.4–10.4)
CHLORIDE: 105 meq/L (ref 98–109)
CO2: 31 mEq/L — ABNORMAL HIGH (ref 22–29)
Creatinine: 0.9 mg/dL (ref 0.6–1.1)
EGFR: 86 mL/min/{1.73_m2} — AB (ref >90)
GLUCOSE: 113 mg/dL (ref 70–140)
POTASSIUM: 4 meq/L (ref 3.5–5.1)
SODIUM: 143 meq/L (ref 136–145)
Total Bilirubin: 0.6 mg/dL (ref 0.20–1.20)
Total Protein: 7.6 g/dL (ref 6.4–8.3)

## 2016-04-06 NOTE — Progress Notes (Signed)
Hematology and Oncology Follow Up Visit  Rachel Rivas [id number] 09-Jun-1961 55 y.o. 04/06/2016 1:30 PM Rachel Rivas, MDBedsole, Rachel E, MD   Principle Diagnosis: 55 year old woman with thrombocytopenia diagnosed April 2016. The diagnosis is related to ITP and autoimmune etiology related to rheumatoid arthritis.   Current therapy: Observation and surveillance.  Interim History: [name] presents today for a follow-up visit. Since the last visit, she reports no major changes in her health. She continues to perform activities of daily living and continues to go to school. She denied any increased joint pain or discomfort. She has not reported any bleeding complications such as epistaxis, hematochezia or melena. Has not reported any lymphadenopathy or petechiae. She has not reported any recent illnesses or hospitalization. She has not reported any spontaneous bruising.  She does not report any headaches, blurry vision, syncope or seizures. She does not report any fevers, chills, sweats. She does not report any cough or hemoptysis hematemesis. She does not report any nausea, vomiting, abdominal pain, hematochezia, melena, constipation or diarrhea. She does not report any frequency, urgency or hesitancy. She does not report any mood issues or depression. She does not report any lymphadenopathy. Remaining review of systems unremarkable.   Medications: I have reviewed the patient's current medications.  Current Outpatient Prescriptions  Medication Sig Dispense Refill  . FOLIC ACID PO Take 1 mg by mouth 2 (two) times daily.    Marland Kitchen losartan-hydrochlorothiazide (HYZAAR) 50-12.5 MG tablet Take 1 tablet by mouth daily. 90 tablet 3  . methotrexate (RHEUMATREX) 2.5 MG tablet Take 8 tablets by mouth once a week.    . mometasone (NASONEX) 50 MCG/ACT nasal spray Place 2 sprays into the nose daily. 7.5 g 0  . predniSONE (DELTASONE) 20 MG tablet 3 tabs by mouth daily x 3 days, then 2 tabs by mouth daily x 2 days  then 1 tab by mouth daily x 2 days 15 tablet 0  . predniSONE (DELTASONE) 5 MG tablet Take 5 mg by mouth as needed. Has this as needed for arthritis    . traMADol (ULTRAM) 50 MG tablet Take 1 tablet (50 mg total) by mouth every 8 (eight) hours as needed. 30 tablet 0   No current facility-administered medications for this visit.     Allergies:  Allergies  Allergen Reactions  . Latex Itching, Dermatitis and Rash    Past Medical History, Surgical history, Social history, and Family History were reviewed and updated.   Physical Exam: Blood pressure 140/65, pulse 86, temperature 98.6 F (37 C), temperature source Oral, resp. rate 18, height 5' 3.5" (1.613 m), weight 197 lb 14.4 oz (89.767 kg), last menstrual period 11/17/2012, SpO2 99 %. ECOG: 0 General appearance: alert and cooperative appeared without distress. Head: Normocephalic, without obvious abnormality no oral ulcers or lesions. Neck: no adenopathy and no carotid bruit Lymph nodes: Cervical, supraclavicular, and axillary nodes normal. Heart:regular rate and rhythm, S1, S2 normal, no murmur, click, rub or gallop Lung:chest clear, no wheezing, rales, normal symmetric air entry Abdomin: soft, non-tender, without masses or organomegaly no shifting dullness or ascites. No splenomegaly. EXT:no erythema, induration, or nodules Skin showed no petechiae.  Lab Results: Lab Results  Component Value Date   WBC 7.2 12/08/2015   HGB 12.8 12/08/2015   HCT 39.6 12/08/2015   MCV 84.7 12/08/2015   PLT 59.0* 12/08/2015     Chemistry      Component Value Date/Time   NA 142 12/08/2015 0848   NA 143 09/09/2015 0840   K 3.8  12/08/2015 0848   K 3.9 09/09/2015 0840   CL 106 12/08/2015 0848   CO2 30 12/08/2015 0848   CO2 27 09/09/2015 0840   BUN 20 12/08/2015 0848   BUN 15.2 09/09/2015 0840   CREATININE 0.85 12/08/2015 0848   CREATININE 0.8 09/09/2015 0840      Component Value Date/Time   CALCIUM 9.2 12/08/2015 0848   CALCIUM 9.5  09/09/2015 0840   ALKPHOS 81 12/08/2015 0848   ALKPHOS 79 09/09/2015 0840   AST 14 12/08/2015 0848   AST 16 09/09/2015 0840   ALT 17 12/08/2015 0848   ALT 17 09/09/2015 0840   BILITOT 0.4 12/08/2015 0848   BILITOT 0.68 09/09/2015 0840       Impression and Plan:   55 year old woman with the following issues:  1. Thrombocytopenia: The differential diagnosis including ITP. Her platelet count is stable today close to 50,000 without any bleeding episodes. The plan is to continue with frequent monitoring and use prednisone she develops any acute bleeding or drop of platelets are below 30,000.  2. Rheumatoid arthritis: Appears to be manageable at this time without any recent issues.  3. Follow-up: Will be in 6 months but sooner if needed to few develops any signs or symptoms.   Zola Button, MD 5/17/20171:30 PM

## 2016-04-06 NOTE — Addendum Note (Signed)
Addended by: Randolm Idol on: 04/06/2016 01:50 PM   Modules accepted: Orders, Medications

## 2016-04-06 NOTE — Telephone Encounter (Signed)
Gave pt apt & avs °

## 2016-05-10 ENCOUNTER — Encounter: Payer: Self-pay | Admitting: *Deleted

## 2016-05-11 ENCOUNTER — Ambulatory Visit: Payer: BLUE CROSS/BLUE SHIELD | Admitting: Gastroenterology

## 2016-06-23 DIAGNOSIS — M542 Cervicalgia: Secondary | ICD-10-CM | POA: Diagnosis not present

## 2016-06-23 DIAGNOSIS — M5136 Other intervertebral disc degeneration, lumbar region: Secondary | ICD-10-CM | POA: Diagnosis not present

## 2016-06-23 DIAGNOSIS — M0589 Other rheumatoid arthritis with rheumatoid factor of multiple sites: Secondary | ICD-10-CM | POA: Diagnosis not present

## 2016-06-23 DIAGNOSIS — D696 Thrombocytopenia, unspecified: Secondary | ICD-10-CM | POA: Diagnosis not present

## 2016-07-11 ENCOUNTER — Encounter: Payer: Self-pay | Admitting: Primary Care

## 2016-07-11 ENCOUNTER — Ambulatory Visit: Payer: BLUE CROSS/BLUE SHIELD | Admitting: Primary Care

## 2016-07-11 ENCOUNTER — Ambulatory Visit (INDEPENDENT_AMBULATORY_CARE_PROVIDER_SITE_OTHER): Payer: BLUE CROSS/BLUE SHIELD | Admitting: Primary Care

## 2016-07-11 VITALS — BP 122/72 | HR 59 | Temp 98.7°F | Ht 63.5 in | Wt 202.4 lb

## 2016-07-11 DIAGNOSIS — M25561 Pain in right knee: Secondary | ICD-10-CM | POA: Diagnosis not present

## 2016-07-11 MED ORDER — IBUPROFEN 800 MG PO TABS: 800.0000 mg | ORAL_TABLET | Freq: Three times a day (TID) | ORAL | 0 refills | Status: DC | PRN

## 2016-07-11 NOTE — Patient Instructions (Signed)
Your knee pain is caused by aggravation of the tendons in your knee.   Continue Ibuprofen as needed for pain and inflammation. Take 1 tablet by mouth as needed every 8 hours.   Please notify your Rheumatologist of your knee pain and the use of Ibuprofen as they may have you hold your Methotrexate.   Purchase a brace to use for support. Continue application of heat/ice as needed.  Please notify me if no improvement in 1 week.  It was a pleasure to see you today!   Knee Pain Knee pain is a very common symptom and can have many causes. Knee pain often goes away when you follow your health care provider's instructions for relieving pain and discomfort at home. However, knee pain can develop into a condition that needs treatment. Some conditions may include:  Arthritis caused by wear and tear (osteoarthritis).  Arthritis caused by swelling and irritation (rheumatoid arthritis or gout).  A cyst or growth in your knee.  An infection in your knee joint.  An injury that will not heal.  Damage, swelling, or irritation of the tissues that support your knee (torn ligaments or tendinitis). If your knee pain continues, additional tests may be ordered to diagnose your condition. Tests may include X-rays or other imaging studies of your knee. You may also need to have fluid removed from your knee. Treatment for ongoing knee pain depends on the cause, but treatment may include:  Medicines to relieve pain or swelling.  Steroid injections in your knee.  Physical therapy.  Surgery. HOME CARE INSTRUCTIONS  Take medicines only as directed by your health care provider.  Rest your knee and keep it raised (elevated) while you are resting.  Do not do things that cause or worsen pain.  Avoid high-impact activities or exercises, such as running, jumping rope, or doing jumping jacks.  Apply ice to the knee area:  Put ice in a plastic bag.  Place a towel between your skin and the bag.  Leave  the ice on for 20 minutes, 2-3 times a day.  Ask your health care provider if you should wear an elastic knee support.  Keep a pillow under your knee when you sleep.  Lose weight if you are overweight. Extra weight can put pressure on your knee.  Do not use any tobacco products, including cigarettes, chewing tobacco, or electronic cigarettes. If you need help quitting, ask your health care provider. Smoking may slow the healing of any bone and joint problems that you may have. SEEK MEDICAL CARE IF:  Your knee pain continues, changes, or gets worse.  You have a fever along with knee pain.  Your knee buckles or locks up.  Your knee becomes more swollen. SEEK IMMEDIATE MEDICAL CARE IF:   Your knee joint feels hot to the touch.  You have chest pain or trouble breathing.   This information is not intended to replace advice given to you by your health care provider. Make sure you discuss any questions you have with your health care provider.   Document Released: 09/04/2007 Document Revised: 11/28/2014 Document Reviewed: 06/23/2014 Elsevier Interactive Patient Education [employer].

## 2016-07-11 NOTE — Progress Notes (Signed)
Subjective:    Patient ID: Rachel Rivas, female    DOB: Oct 01, 1961, 55 y.o.   MRN: [id number]  HPI  [name] is a 55 year old female with a history of Rheumatoid Arthritis who presents today with a chief complaint of knee pain. She also reports swelling. Her pain is located to the right knee and has been present intermittently since Tuesday last week. Yesterday she had difficulty walking Sunday and required use of a cane. She woke up early this morning with excruciating pain. She's currently managed on Methotrexate weekly per Rheumatology. She's taken 800 mg of Motrin this morning with moderate improvement. Denies recent injury/fall, but she thinks she may have stepped incorrectly and twisted her knee last week.  Review of Systems  Constitutional: Negative for fever.  Musculoskeletal: Positive for back pain, joint swelling and neck pain.  Skin: Negative for color change and wound.       Past Medical History:  Diagnosis Date  . Adenomatous colon polyp   . Allergy    occasionally  . Anemia    yrs ago, low platelets  . Arthritis    RA  . Blood transfusion without reported diagnosis    1985 with c section  . Borderline diabetic   . Hypertension   . Thrombocytopenia [facility])      Social History   Social History  . Marital status: Married    Spouse name: N/A  . Number of children: 2  . Years of education: N/A   Occupational History  . 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 Smoking status: Former Smoker    Packs/day: 0.25    Years: 12.00    Types: Cigarettes  . Smokeless tobacco: Never Used  . Alcohol use 0.0 oz/week     Comment: occasional  . Drug use: No     Comment: past; marijuana  . Sexual activity: Not on file   Other Topics Concern  . Not on file   Social History Narrative   Moved here from [place] two years ago.      Works for [employer].             Past Surgical History:  Procedure Laterality Date  .  CESAREAN SECTION    . STERILIZATION      Family History  Problem Relation Age of Onset  . Lung cancer Mother   . Hypertension Mother   . Diabetes Mother   . Uterine cancer Mother   . Hypertension Father   . Diabetes Father   . Colon cancer Neg Hx   . Colon polyps Neg Hx   . Esophageal cancer Neg Hx   . Rectal cancer Neg Hx   . Stomach cancer Neg Hx     Allergies  Allergen Reactions  . Latex Itching, Dermatitis and Rash    Current Outpatient Prescriptions on File Prior to Visit  Medication Sig Dispense Refill  . losartan-hydrochlorothiazide (HYZAAR) 50-12.5 MG tablet Take 1 tablet by mouth daily. 90 tablet 3  . methotrexate (RHEUMATREX) 2.5 MG tablet Take 8 tablets by mouth once a week.    . mometasone (NASONEX) 50 MCG/ACT nasal spray Place 2 sprays into the nose daily. 7.5 g 0  . traMADol (ULTRAM) 50 MG tablet Take 1 tablet (50 mg total) by mouth every 8 (eight) hours as needed. (Patient not taking: Reported on 07/11/2016) 30 tablet 0   No current facility-administered medications on file prior to visit.  BP 122/72   Pulse (!) 59   Temp 98.7 F (37.1 C) (Oral)   Ht 5' 3.5" (1.613 m)   Wt 202 lb 6.4 oz (91.8 kg)   LMP 11/17/2012   SpO2 98%   BMI 35.29 kg/m    Objective:   Physical Exam  Constitutional: She appears well-nourished.  Cardiovascular: Normal rate and regular rhythm.   Pulmonary/Chest: Effort normal and breath sounds normal.  Musculoskeletal:       Right knee: She exhibits decreased range of motion. She exhibits no swelling and no deformity. Tenderness found. MCL tenderness noted.  Skin: Skin is warm and dry.          Assessment & Plan:  Knee Pain:  Located to the right medial knee. Intermittent since Tuesday last week. Some swelling. Pain worse early this morning. Some improvement with one dose of Ibuprofen 800 mg. No obvious swelling noted on exam. Decreased ROM present. Suspect aggravation of tendons/tendonitis. Will continue  conservative treatment. No need for xray today, may consider if discomfort persists. Rx for Ibuprofen 800 mg to use PRN. She will call rheumatologist in regards to holding Methotrexate.  Heat/Ice, elevation, rest, knee brace. Follow up PRN.  Sheral Flow, NP

## 2016-07-11 NOTE — Progress Notes (Signed)
Pre visit review using our clinic review tool, if applicable. No additional management support is needed unless otherwise documented below in the visit note. 

## 2016-07-14 ENCOUNTER — Ambulatory Visit: Payer: BLUE CROSS/BLUE SHIELD | Admitting: Gastroenterology

## 2016-09-14 ENCOUNTER — Encounter: Payer: Self-pay | Admitting: Gastroenterology

## 2016-09-14 ENCOUNTER — Ambulatory Visit (INDEPENDENT_AMBULATORY_CARE_PROVIDER_SITE_OTHER): Payer: BLUE CROSS/BLUE SHIELD | Admitting: Gastroenterology

## 2016-09-14 ENCOUNTER — Other Ambulatory Visit (INDEPENDENT_AMBULATORY_CARE_PROVIDER_SITE_OTHER): Payer: BLUE CROSS/BLUE SHIELD

## 2016-09-14 VITALS — BP 104/72 | HR 72 | Ht 63.75 in | Wt 200.4 lb

## 2016-09-14 DIAGNOSIS — D696 Thrombocytopenia, unspecified: Secondary | ICD-10-CM | POA: Diagnosis not present

## 2016-09-14 DIAGNOSIS — K219 Gastro-esophageal reflux disease without esophagitis: Secondary | ICD-10-CM

## 2016-09-14 DIAGNOSIS — R1310 Dysphagia, unspecified: Secondary | ICD-10-CM

## 2016-09-14 LAB — CBC WITH DIFFERENTIAL/PLATELET
BASOS ABS: 0 10*3/uL (ref 0.0–0.1)
Basophils Relative: 0.3 % (ref 0.0–3.0)
EOS ABS: 0.1 10*3/uL (ref 0.0–0.7)
Eosinophils Relative: 1.1 % (ref 0.0–5.0)
HCT: 38.5 % (ref 36.0–46.0)
Hemoglobin: 12.9 g/dL (ref 12.0–15.0)
LYMPHS ABS: 2.6 10*3/uL (ref 0.7–4.0)
Lymphocytes Relative: 41.3 % (ref 12.0–46.0)
MCHC: 33.4 g/dL (ref 30.0–36.0)
MCV: 84 fl (ref 78.0–100.0)
Monocytes Absolute: 0.5 10*3/uL (ref 0.1–1.0)
Monocytes Relative: 7.9 % (ref 3.0–12.0)
NEUTROS ABS: 3.1 10*3/uL (ref 1.4–7.7)
NEUTROS PCT: 49.4 % (ref 43.0–77.0)
PLATELETS: 73 10*3/uL — AB (ref 150.0–400.0)
RBC: 4.58 Mil/uL (ref 3.87–5.11)
RDW: 15.6 % — AB (ref 11.5–15.5)
WBC: 6.3 10*3/uL (ref 4.0–10.5)

## 2016-09-14 LAB — PROTIME-INR
INR: 1.1 ratio — ABNORMAL HIGH (ref 0.8–1.0)
PROTHROMBIN TIME: 11.1 s (ref 9.6–13.1)

## 2016-09-14 MED ORDER — RANITIDINE HCL 150 MG PO TABS
150.0000 mg | ORAL_TABLET | Freq: Two times a day (BID) | ORAL | 3 refills | Status: DC
Start: 2016-09-14 — End: 2019-01-04

## 2016-09-14 NOTE — Progress Notes (Signed)
HPI :  55 y/o female with history of RA and thrombocytopenia, here for evaluation of reflux symptoms and dysphagia.  She reports certain solid foods cause dysphagia, she can feel it in her upper chest. No dysphagia to solids. Symptoms ongoing for a long time but seem to be getting worse recently. She also endorses severe regurgitation and heartburn which bothers her. She reports both postprandial symptoms and nocturnal symptoms which bother her. Chocolate in particular can make her symptoms severe and she tries to avoid this. She denies any vomiting. No postprandial pain. She is taking zantac PRN, she does not take it routinely. She reports symptoms bother her on most days, but just dealing with it, she does not take zantac often. She has not tried continous use. She states zantac does help when she takes it  She has thrombocytopenia dx in April 2016. It is thought to be due to ITP / medication effect, followed by hematolofy. She has a history of methotrexate use for RA. Normal LFts historically, no history of liver disease.  Colonoscopy 02/22/16 - 56mm ascending polyp - adenoma, recall 2022  Past Medical History:  Diagnosis Date  . Adenomatous colon polyp   . Allergy    occasionally  . Anemia    yrs ago, low platelets  . Arthritis    RA  . Blood transfusion without reported diagnosis    1985 with c section  . Borderline diabetic   . Hypertension   . Thrombocytopenia ([place])      Past Surgical History:  Procedure Laterality Date  . CESAREAN SECTION    . STERILIZATION     Family History  Problem Relation Age of Onset  . Lung cancer Mother   . Hypertension Mother   . Diabetes Mother   . Uterine cancer Mother   . Hypertension Father   . Diabetes Father   . Colon cancer Neg Hx   . Colon polyps Neg Hx   . Esophageal cancer Neg Hx   . Rectal cancer Neg Hx   . Stomach cancer Neg Hx    Social History  Substance Use Topics  . Smoking status: Former Smoker    Packs/day: 0.25   Years: 12.00    Types: Cigarettes  . Smokeless tobacco: Never Used  . Alcohol use 0.0 oz/week     Comment: occasional   Current Outpatient Prescriptions  Medication Sig Dispense Refill  . ibuprofen (ADVIL,MOTRIN) 800 MG tablet Take 1 tablet (800 mg total) by mouth every 8 (eight) hours as needed for moderate pain. 15 tablet 0  . losartan-hydrochlorothiazide (HYZAAR) 50-12.5 MG tablet Take 1 tablet by mouth daily. 90 tablet 3  . methotrexate (RHEUMATREX) 2.5 MG tablet Take 8 tablets by mouth once a week.    . mometasone (NASONEX) 50 MCG/ACT nasal spray Place 2 sprays into the nose as needed.     No current facility-administered medications for this visit.    Allergies  Allergen Reactions  . Latex Itching, Dermatitis and Rash     Review of Systems: All systems reviewed and negative except where noted in HPI.   Lab Results  Component Value Date   WBC 5.7 04/06/2016   HGB 12.2 04/06/2016   HCT 37.5 04/06/2016   MCV 87.4 04/06/2016   PLT 48 (L) 04/06/2016    CBC Latest Ref Rng & Units 04/06/2016 12/08/2015 09/09/2015  WBC 3.9 - 10.3 10e3/uL 5.7 7.2 5.3  Hemoglobin 11.6 - 15.9 g/dL 12.2 12.8 12.6  Hematocrit 34.8 - 46.6 % 37.5  39.6 38.9  Platelets 145 - 400 10e3/uL 48(L) 59.0(L) 67 Large & giant platelets(L)   Lab Results  Component Value Date   CREATININE 0.9 04/06/2016   BUN 13.8 04/06/2016   NA 143 04/06/2016   K 4.0 04/06/2016   CL 106 12/08/2015   CO2 31 (H) 04/06/2016    Lab Results  Component Value Date   ALT 24 04/06/2016   AST 15 04/06/2016   ALKPHOS 73 04/06/2016   BILITOT 0.60 04/06/2016      Physical Exam: BP 104/72 (BP Location: Left Arm, Patient Position: Sitting, Cuff Size: Normal)   Pulse 72   Ht 5' 3.75" (1.619 m) Comment: height measured without shoes  Wt 200 lb 6 oz (90.9 kg)   LMP 11/17/2012   BMI 34.66 kg/m  Constitutional: Pleasant,well-developed, female in no acute distress. HEENT: Normocephalic and atraumatic. Conjunctivae are  normal. No scleral icterus. Neck supple.  Cardiovascular: Normal rate, regular rhythm.  Pulmonary/chest: Effort normal and breath sounds normal. No wheezing, rales or rhonchi. Abdominal: Soft, protuberant, nontender. There are no masses palpable. No hepatomegaly. Extremities: no edema Lymphadenopathy: No cervical adenopathy noted. Neurological: Alert and oriented to person place and time. Skin: Skin is warm and dry. No rashes noted. Psychiatric: Normal mood and affect. Behavior is normal.   ASSESSMENT AND PLAN: 55 year old female with long-standing GERD and dysphagia presenting for evaluation of these issues.  Given her long-standing reflux with dysphagia, recommended EGD to clarify with driving the dysphagia and potentially dilate pending findings. I discussed risks and benefits of endoscopy and anesthesia with her, she wished to proceed. She does have a history of what is thought to be ITP, we will recheck her platelet count to make sure this is a safe value for endoscopy. We'll also check coags to ensure normal, if INR is elevated may consider an ultrasound to ensure no evidence of underlying liver disease although her LFTs are normal.   Otherwise we discussed management of reflux. I advised her to avoid eating within 4 hours of bedtime, to sleep with the head of her bed elevated as needed. We also discussed medical management, H2 blockers versus PPI. She does have benefit from Zantac when she takes it, but she does not take much at all at baseline. We'll try a course of twice a day Zantac for a few weeks to see if this provides benefit. If it does not, then we will give her a trial of PPI.   She agreed to the plan, all questions answered  Flora Cellar, MD [facility] Gastroenterology Pager [phone]

## 2016-09-14 NOTE — Patient Instructions (Signed)
If you are age 57 or older, your body mass index should be between 23-30. Your Body mass index is 34.66 kg/m. If this is out of the aforementioned range listed, please consider follow up with your Primary Care Provider.  If you are age 45 or younger, your body mass index should be between 19-25. Your Body mass index is 34.66 kg/m. If this is out of the aformentioned range listed, please consider follow up with your Primary Care Provider.   We have sent the following medications to your pharmacy for you to pick up at your convenience: Zantac150mg . Take one tablet twice daily.  You have been scheduled for an endoscopy. Please follow written instructions given to you at your visit today. If you use inhalers (even only as needed), please bring them with you on the day of your procedure. Your physician has requested that you go to www.startemmi.com and enter the access code given to you at your visit today. This web site gives a general overview about your procedure. However, you should still follow specific instructions given to you by our office regarding your preparation for the procedure.  Your physician has requested that you go to the basement for the following lab work before leaving today: CBC, PT/INR

## 2016-09-16 ENCOUNTER — Encounter: Payer: Self-pay | Admitting: Gastroenterology

## 2016-09-28 ENCOUNTER — Encounter: Payer: Self-pay | Admitting: Gastroenterology

## 2016-09-28 ENCOUNTER — Ambulatory Visit (AMBULATORY_SURGERY_CENTER): Payer: BLUE CROSS/BLUE SHIELD | Admitting: Gastroenterology

## 2016-09-28 VITALS — BP 115/73 | HR 68 | Temp 97.1°F | Resp 16 | Ht 63.75 in | Wt 200.0 lb

## 2016-09-28 DIAGNOSIS — R1310 Dysphagia, unspecified: Secondary | ICD-10-CM | POA: Diagnosis present

## 2016-09-28 DIAGNOSIS — K222 Esophageal obstruction: Secondary | ICD-10-CM

## 2016-09-28 MED ORDER — SODIUM CHLORIDE 0.9 % IV SOLN: 500.0000 mL | INTRAVENOUS | Status: DC

## 2016-09-28 NOTE — Progress Notes (Signed)
Per Dr. Havery Moros pt to have soft diet the rest of today.  Then resume her prior diet tomorrow am..  Pt c/o scratchy, sore throat, which I explained was normal after esophageal dilataion.  I advised a warm water gargle.  OTC sercrets to suck on.  It may take a few days to resolve, but to call if she needs [place]. maw

## 2016-09-28 NOTE — Op Note (Signed)
[place] Patient Name: Rachel Rivas Procedure Date: 09/28/2016 9:24 AM MRN: [id number] Endoscopist: Remo Lipps P. Armbruster MD, MD Age: 55 Referring MD:  Date of Birth: 02-Apr-1961 Gender: Female Account #: [id number] Procedure:                Upper GI endoscopy Indications:              Dysphagia, Heartburn Medicines:                Monitored Anesthesia Care Procedure:                Pre-Anesthesia Assessment:                           - Prior to the procedure, a History and Physical                            was performed, and patient medications and                            allergies were reviewed. The patient's tolerance of                            previous anesthesia was also reviewed. The risks                            and benefits of the procedure and the sedation                            options and risks were discussed with the patient.                            All questions were answered, and informed consent                            was obtained. Prior Anticoagulants: The patient has                            taken no previous anticoagulant or antiplatelet                            agents. ASA Grade Assessment: II - A patient with                            mild systemic disease. After reviewing the risks                            and benefits, the patient was deemed in                            satisfactory condition to undergo the procedure.                           After obtaining informed consent, the endoscope was  passed under direct vision. Throughout the                            procedure, the patient's blood pressure, pulse, and                            oxygen saturations were monitored continuously. The                            Model GIF-HQ190 [id number]) scope was introduced                            through the mouth, and advanced to the second part                            of duodenum. The upper  GI endoscopy was                            accomplished without difficulty. The patient                            tolerated the procedure well. Scope In: Scope Out: Findings:                 Esophagogastric landmarks were identified: the                            Z-line was found at 37 cm, the gastroesophageal                            junction was found at 37 cm and the upper extent of                            the gastric folds was found at 39 cm from the                            incisors.                           A 2 cm hiatal hernia was present.                           mild Schatzki ring (acquired) was found at the                            gastroesophageal junction. A TTS dilator was passed                            through the scope. Dilation with an 18-19-20 mm                            balloon dilator was performed at 79mm, 65mm, and  then 20 mm. No wrents noted at 18 and 43mm, and                            then a small wrent noted at 71mm. The stricture was                            then biopsied with cold forceps in multiple areas                            to open it up further.                           The exam of the esophagus was otherwise normal.                           The entire examined stomach was normal. Of note, I                            thought a photograph of the pylorus was taken, but                            it was not. Pylorus was normal.                           Localized nodular mucosa was found in the duodenal                            bulb consistent with benign ectopic gastric mucosa.                            Biopsies were taken with a cold forceps for                            histology to rule out adenomatous changes.                           The exam of the duodenum was otherwise normal. Complications:            No immediate complications. Estimated blood loss:                             Minimal. Estimated Blood Loss:     Estimated blood loss was minimal. Impression:               - Esophagogastric landmarks identified.                           - 2 cm hiatal hernia.                           - Mild Schatzki ring. Dilated up to 85mm. Biopsied.                           - Normal esophagus otherwise                           -  Normal stomach.                           - Nodular mucosa in the duodenal bulb, suspect                            benign ectopic gastric mucosa. Biopsied.                           - Normal duodenum otherwise Recommendation:           - Patient has a contact number available for                            emergencies. The signs and symptoms of potential                            delayed complications were discussed with the                            patient. Return to normal activities tomorrow.                            Written discharge instructions were provided to the                            patient.                           - Resume previous diet.                           - Continue present medications (Zantac)                           - No aspirin, ibuprofen, naproxen, or other                            non-steroidal anti-inflammatory drugs for 2 weeks                            post dilation.                           - Await pathology results.                           - Repeat upper endoscopy for retreatment as needed                            if symptoms recur in the future Steven P. Armbruster MD, MD 09/28/2016 9:46:08 AM This report has been signed electronically.

## 2016-09-28 NOTE — Progress Notes (Signed)
A/ox3, pleased with MAC, report to RN 

## 2016-09-28 NOTE — Patient Instructions (Signed)
YOU HAD AN ENDOSCOPIC PROCEDURE TODAY AT [facility]:   Refer to the procedure report that was given to you for any specific questions about what was found during the examination.  If the procedure report does not answer your questions, please call your gastroenterologist to clarify.  If you requested that your care partner not be given the details of your procedure findings, then the procedure report has been included in a sealed envelope for you to review at your convenience later.  YOU SHOULD EXPECT: Some feelings of bloating in the abdomen. Passage of more gas than usual.  Walking can help get rid of the air that was put into your GI tract during the procedure and reduce the bloating. If you had a lower endoscopy (such as a colonoscopy or flexible sigmoidoscopy) you may notice spotting of blood in your stool or on the toilet paper. If you underwent a bowel prep for your procedure, you may not have a normal bowel movement for a few days.  Please Note:  You might notice some irritation and congestion in your nose or some drainage.  This is from the oxygen used during your procedure.  There is no need for concern and it should clear up in a day or so.  SYMPTOMS TO REPORT IMMEDIATELY:   Following lower endoscopy (colonoscopy or flexible sigmoidoscopy):  Excessive amounts of blood in the stool  Significant tenderness or worsening of abdominal pains  Swelling of the abdomen that is new, acute  Fever of 100F or higher   Following upper endoscopy (EGD)  Vomiting of blood or coffee ground material  New chest pain or pain under the shoulder blades  Painful or persistently difficult swallowing  New shortness of breath  Fever of 100F or higher  Black, tarry-looking stools  For urgent or emergent issues, a gastroenterologist can be reached at any hour by calling [phone].   DIET:  Please follow the dilatation diet the rest of today.  Handout was provided.   Drink plenty of  fluids but you should avoid alcoholic beverages for 24 hours.  ACTIVITY:  You should plan to take it easy for the rest of today and you should NOT DRIVE or use heavy machinery until tomorrow (because of the sedation medicines used during the test).    FOLLOW UP: Our staff will call the number listed on your records the next business day following your procedure to check on you and address any questions or concerns that you may have regarding the information given to you following your procedure. If we do not reach you, we will leave a message.  However, if you are feeling well and you are not experiencing any problems, there is no need to return our call.  We will assume that you have returned to your regular daily activities without incident.  If any biopsies were taken you will be contacted by phone or by letter within the next 1-3 weeks.  Please call us at [phone] if you have not heard about the biopsies in 3 weeks.    SIGNATURES/CONFIDENTIALITY: You and/or your care partner have signed paperwork which will be entered into your electronic medical record.  These signatures attest to the fact that that the information above on your After Visit Summary has been reviewed and is understood.  Full responsibility of the confidentiality of this discharge information lies with you and/or your care-partner.    Handouts were given to your care partner on hiatal hernia and esophageal  dilatation diet to follow the rest of the day.  Then resume your prior diet tomorrow am. No aspirin, aspirin products,  ibuprofen, naproxen, advil, motrin, aleve, or other non-steroidal anti-inflammatory drugs for 14 days after polyp removal. You may resume your other current medications today. Await biopsy results. Please call if any questions or concerns.

## 2016-09-29 ENCOUNTER — Telehealth: Payer: Self-pay | Admitting: *Deleted

## 2016-09-29 NOTE — Telephone Encounter (Signed)
  Follow up Call-  Call back number 09/28/2016 02/22/2016  Post procedure Call Back phone  # [phone]  Permission to leave phone message Yes Yes  Some recent data might be hidden     Patient questions:  Do you have a fever, pain , or abdominal swelling? No. Pain Score  0 *  Have you tolerated food without any problems? Yes.    Have you been able to return to your normal activities? Yes.    Do you have any questions about your discharge instructions: Diet   No. Medications  No. Follow up visit  No.  Do you have questions or concerns about your Care? No.  Actions: * If pain score is 4 or above: No action needed, pain <4.  Patient c/o "sore throat". Encouraged patient to use OTC products for sore throat. Explained to call us back if this does not improve.  Patient understands.

## 2016-10-04 ENCOUNTER — Encounter: Payer: Self-pay | Admitting: Gastroenterology

## 2016-10-07 ENCOUNTER — Ambulatory Visit (HOSPITAL_BASED_OUTPATIENT_CLINIC_OR_DEPARTMENT_OTHER): Payer: BLUE CROSS/BLUE SHIELD | Admitting: Oncology

## 2016-10-07 ENCOUNTER — Other Ambulatory Visit (HOSPITAL_BASED_OUTPATIENT_CLINIC_OR_DEPARTMENT_OTHER): Payer: BLUE CROSS/BLUE SHIELD

## 2016-10-07 ENCOUNTER — Telehealth: Payer: Self-pay | Admitting: Oncology

## 2016-10-07 VITALS — BP 140/73 | HR 74 | Temp 98.1°F | Resp 18 | Ht 63.75 in | Wt 203.5 lb

## 2016-10-07 DIAGNOSIS — D696 Thrombocytopenia, unspecified: Secondary | ICD-10-CM

## 2016-10-07 DIAGNOSIS — M069 Rheumatoid arthritis, unspecified: Secondary | ICD-10-CM | POA: Diagnosis not present

## 2016-10-07 LAB — COMPREHENSIVE METABOLIC PANEL
ALT: 14 U/L (ref 0–55)
ANION GAP: 11 meq/L (ref 3–11)
AST: 13 U/L (ref 5–34)
Albumin: 4 g/dL (ref 3.5–5.0)
Alkaline Phosphatase: 79 U/L (ref 40–150)
BUN: 13.1 mg/dL (ref 7.0–26.0)
CHLORIDE: 102 meq/L (ref 98–109)
CO2: 28 meq/L (ref 22–29)
CREATININE: 0.8 mg/dL (ref 0.6–1.1)
Calcium: 10 mg/dL (ref 8.4–10.4)
EGFR: >90 mL/min/{1.73_m2} (ref >90)
Glucose: 100 mg/dl (ref 70–140)
Potassium: 3.6 mEq/L (ref 3.5–5.1)
Sodium: 141 mEq/L (ref 136–145)
Total Bilirubin: 0.81 mg/dL (ref 0.20–1.20)
Total Protein: 8 g/dL (ref 6.4–8.3)

## 2016-10-07 NOTE — Progress Notes (Signed)
Hematology and Oncology Follow Up Visit  Rachel Rivas [id number] 06-09-1961 55 y.o. 10/07/2016 10:41 AM Rachel Rivas, MDBedsole, Rachel E, MD   Principle Diagnosis: 55 year old woman with thrombocytopenia diagnosed April 2016. The diagnosis is related to ITP and autoimmune etiology related to rheumatoid arthritis.   Current therapy: Observation and surveillance.  Interim History: [name] presents today for a follow-up visit. Since the last visit, she underwent an endoscopy without any major complications. Her biopsy did not show any evidence of malignancy. She did not have any postprocedural bleeding. She has not reported any bleeding complications such as epistaxis, hematochezia or melena. Has not reported any lymphadenopathy or petechiae. She has not reported any recent illnesses or hospitalization. She has not reported any spontaneous bruising. She continues to be on methotrexate for rheumatoid arthritis which maintained her flareups reasonably controlled.  She does not report any headaches, blurry vision, syncope or seizures. She does not report any fevers, chills, sweats. She does not report any cough or hemoptysis hematemesis. She does not report any nausea, vomiting, abdominal pain, hematochezia, melena, constipation or diarrhea. She does not report any frequency, urgency or hesitancy. She does not report any mood issues or depression. She does not report any lymphadenopathy. Remaining review of systems unremarkable.   Medications: I have reviewed the patient's current medications.  Current Outpatient Prescriptions  Medication Sig Dispense Refill  . folic acid (FOLVITE) 1 MG tablet   3  . ibuprofen (ADVIL,MOTRIN) 800 MG tablet Take 1 tablet (800 mg total) by mouth every 8 (eight) hours as needed for moderate pain. 15 tablet 0  . losartan-hydrochlorothiazide (HYZAAR) 50-12.5 MG tablet Take 1 tablet by mouth daily. 90 tablet 3  . methotrexate (RHEUMATREX) 2.5 MG tablet Take 8 tablets by  mouth once a week.    . mometasone (NASONEX) 50 MCG/ACT nasal spray Place 2 sprays into the nose as needed.    . ranitidine (ZANTAC) 150 MG tablet Take 1 tablet (150 mg total) by mouth 2 (two) times daily. 90 tablet 3   Current Facility-Administered Medications  Medication Dose Route Frequency Provider Last Rate Last Dose  . 0.9 %  sodium chloride infusion  500 mL Intravenous Continuous Manus Gunning, MD         Allergies:  Allergies  Allergen Reactions  . Latex Itching, Dermatitis and Rash    Past Medical History, Surgical history, Social history, and Family History were reviewed and updated.   Physical Exam: Blood pressure 140/73, pulse 74, temperature 98.1 F (36.7 C), temperature source Oral, resp. rate 18, height 5' 3.75" (1.619 m), weight 203 lb 8 oz (92.3 kg), last menstrual period 11/17/2012, SpO2 100 %. ECOG: 0 General appearance: Well-appearing woman without distress. Head: Normocephalic, without obvious abnormality no oral ulcers or lesions. Neck: no adenopathy and no carotid bruit no thyroid masses. Lymph nodes: Cervical, supraclavicular, and axillary nodes normal. Heart:regular rate and rhythm, S1, S2 normal, no murmur, click, rub or gallop Lung:chest clear, no wheezing, rales, normal symmetric air entry Abdomin: soft, non-tender, without masses or organomegaly no rebound or guarding. EXT:no erythema, induration, or nodules Skin showed no petechiae.  Lab Results: Lab Results  Component Value Date   WBC 6.3 09/14/2016   HGB 12.9 09/14/2016   HCT 38.5 09/14/2016   MCV 84.0 09/14/2016   PLT 73.0 (L) 09/14/2016     Chemistry      Component Value Date/Time   NA 143 04/06/2016 1306   K 4.0 04/06/2016 1306   CL 106 12/08/2015 0848  CO2 31 (H) 04/06/2016 1306   BUN 13.8 04/06/2016 1306   CREATININE 0.9 04/06/2016 1306      Component Value Date/Time   CALCIUM 9.5 04/06/2016 1306   ALKPHOS 73 04/06/2016 1306   AST 15 04/06/2016 1306   ALT 24  04/06/2016 1306   BILITOT 0.60 04/06/2016 1306       Impression and Plan:   55 year old woman with the following issues:  1. Thrombocytopenia: The differential diagnosis including ITP. Her platelet count is Improved close to 75,000 without any active bleeding. I do not recommend any intervention at this time other than monitoring. A trial of steroids and IVIG would be needed if she develops worsening thrombocytopenia.  2. Rheumatoid arthritis: Managed with methotrexate without any flareups.  3. Follow-up: Will be in 6 months but sooner if needed to few develops any signs or symptoms.   [id number], MD 11/17/201710:41 AM

## 2016-10-07 NOTE — Telephone Encounter (Signed)
Appointments scheduled per 10/07/16 los. AVS and appointment schedule was given to patient,per 10/07/16 los.  °

## 2016-10-19 DIAGNOSIS — M0589 Other rheumatoid arthritis with rheumatoid factor of multiple sites: Secondary | ICD-10-CM | POA: Diagnosis not present

## 2016-10-19 DIAGNOSIS — M542 Cervicalgia: Secondary | ICD-10-CM | POA: Diagnosis not present

## 2016-10-19 DIAGNOSIS — M5136 Other intervertebral disc degeneration, lumbar region: Secondary | ICD-10-CM | POA: Diagnosis not present

## 2016-10-19 DIAGNOSIS — D696 Thrombocytopenia, unspecified: Secondary | ICD-10-CM | POA: Diagnosis not present

## 2016-12-06 ENCOUNTER — Ambulatory Visit (INDEPENDENT_AMBULATORY_CARE_PROVIDER_SITE_OTHER): Payer: BLUE CROSS/BLUE SHIELD | Admitting: Family Medicine

## 2016-12-06 ENCOUNTER — Encounter: Payer: Self-pay | Admitting: Family Medicine

## 2016-12-06 DIAGNOSIS — M5442 Lumbago with sciatica, left side: Secondary | ICD-10-CM | POA: Diagnosis not present

## 2016-12-06 MED ORDER — PREDNISONE 20 MG PO TABS
ORAL_TABLET | ORAL | 0 refills | Status: DC
Start: 2016-12-06 — End: 2017-12-28

## 2016-12-06 MED ORDER — TRAMADOL HCL 50 MG PO TABS: 50.0000 mg | ORAL_TABLET | Freq: Three times a day (TID) | ORAL | 0 refills | Status: DC | PRN

## 2016-12-06 NOTE — Patient Instructions (Addendum)
Start prednisone taper.  Can use tramadol for breakthrough pain.  Call for likely MRI eval if not improving as expected in 2 weeks or keep appointment.  Start home stretching as able.  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  No heavy lifting > 5 lbs for now. No repetitive  Bending or twisting.

## 2016-12-06 NOTE — Progress Notes (Signed)
Pre visit review using our clinic review tool, if applicable. No additional management support is needed unless otherwise documented below in the visit note. 

## 2016-12-06 NOTE — Assessment & Plan Note (Signed)
Treat with pred taper, heat, home PT info given. Tramadol for break through pain.  If not improving eval with MRI.

## 2016-12-06 NOTE — Progress Notes (Signed)
   Subjective:    Patient ID: Rachel Rivas, female    DOB: 1961-01-07, 56 y.o.   MRN: [id number]  HPI  56 year old female with RA on methotrexate presents with new onset pain  In left buttock in last 3-4 days.  No fall, no new injury.  No change in activity.   Pain radiates down left leg, left leg tingly. 10/10 pain. She has used ibuprofen 800 mg .. Helped minimaly.   Fells like left side is weak.  10/2015 right sciatica.   X-ray:IMPRESSION: Increased disc space loss at L4-5 consistent with degenerative disc disease. There is mild to moderate facet joint hypertrophy at L4-5 and at L5-S1. There is no compression fracture or spondylolisthesis.  Review of Systems  Constitutional: Negative for fatigue and fever.  HENT: Negative for ear pain.   Eyes: Negative for pain.  Respiratory: Negative for chest tightness and shortness of breath.   Cardiovascular: Negative for chest pain, palpitations and leg swelling.  Gastrointestinal: Negative for abdominal pain.  Genitourinary: Negative for dysuria.       No incontinence  no perineal numbness       Objective:   Physical Exam  Constitutional: Vital signs are normal. She appears well-developed and well-nourished. She is cooperative.  Non-toxic appearance. She does not appear ill. No distress.  HENT:  Head: Normocephalic.  Right Ear: Hearing, tympanic membrane, external ear and ear canal normal. Tympanic membrane is not erythematous, not retracted and not bulging.  Left Ear: Hearing, tympanic membrane, external ear and ear canal normal. Tympanic membrane is not erythematous, not retracted and not bulging.  Nose: No mucosal edema or rhinorrhea. Right sinus exhibits no maxillary sinus tenderness and no frontal sinus tenderness. Left sinus exhibits no maxillary sinus tenderness and no frontal sinus tenderness.  Mouth/Throat: Uvula is midline, oropharynx is clear and moist and mucous membranes are normal.  Eyes: Conjunctivae, EOM and lids  are normal. Pupils are equal, round, and reactive to light. Lids are everted and swept, no foreign bodies found.  Neck: Trachea normal and normal range of motion. Neck supple. Carotid bruit is not present. No thyroid mass and no thyromegaly present.  Cardiovascular: Normal rate, regular rhythm, S1 normal, S2 normal, normal heart sounds, intact distal pulses and normal pulses.  Exam reveals no gallop and no friction rub.   No murmur heard. Pulmonary/Chest: Effort normal and breath sounds normal. No tachypnea. No respiratory distress. She has no decreased breath sounds. She has no wheezes. She has no rhonchi. She has no rales.  Abdominal: Soft. Normal appearance and bowel sounds are normal. There is no tenderness.  Musculoskeletal:       Lumbar back: She exhibits decreased range of motion, tenderness and bony tenderness.  Positive SLR on left, neg faber's ttp over left sciatic notch Antalgic gait  Neurological: She is alert.  Skin: Skin is warm, dry and intact. No rash noted.  Psychiatric: Her speech is normal and behavior is normal. Judgment and thought content normal. Her mood appears not anxious. Cognition and memory are normal. She does not exhibit a depressed mood.          Assessment & Plan:

## 2016-12-10 ENCOUNTER — Telehealth: Payer: Self-pay | Admitting: Family Medicine

## 2016-12-10 DIAGNOSIS — D696 Thrombocytopenia, unspecified: Secondary | ICD-10-CM

## 2016-12-10 DIAGNOSIS — I10 Essential (primary) hypertension: Secondary | ICD-10-CM

## 2016-12-10 DIAGNOSIS — R7303 Prediabetes: Secondary | ICD-10-CM

## 2016-12-10 NOTE — Telephone Encounter (Signed)
-----   Message from Ellamae Sia sent at 11/30/2016  6:01 PM EST ----- Regarding: Lab orders for Wednesday, 1.24.18 Patient is scheduled for CPX labs, please order future labs, Thanks , Karna Christmas

## 2016-12-14 ENCOUNTER — Other Ambulatory Visit (INDEPENDENT_AMBULATORY_CARE_PROVIDER_SITE_OTHER): Payer: BLUE CROSS/BLUE SHIELD

## 2016-12-14 DIAGNOSIS — I10 Essential (primary) hypertension: Secondary | ICD-10-CM | POA: Diagnosis not present

## 2016-12-14 DIAGNOSIS — R7303 Prediabetes: Secondary | ICD-10-CM

## 2016-12-14 DIAGNOSIS — D696 Thrombocytopenia, unspecified: Secondary | ICD-10-CM | POA: Diagnosis not present

## 2016-12-14 LAB — CBC WITH DIFFERENTIAL/PLATELET
BASOS PCT: 0.2 % (ref 0.0–3.0)
Basophils Absolute: 0 10*3/uL (ref 0.0–0.1)
EOS PCT: 1.1 % (ref 0.0–5.0)
Eosinophils Absolute: 0.1 10*3/uL (ref 0.0–0.7)
HEMATOCRIT: 36.2 % (ref 36.0–46.0)
HEMOGLOBIN: 11.9 g/dL — AB (ref 12.0–15.0)
LYMPHS PCT: 46.6 % — AB (ref 12.0–46.0)
Lymphs Abs: 5 10*3/uL — ABNORMAL HIGH (ref 0.7–4.0)
MCHC: 32.9 g/dL (ref 30.0–36.0)
MCV: 85.1 fl (ref 78.0–100.0)
MONO ABS: 0.6 10*3/uL (ref 0.1–1.0)
Monocytes Relative: 5.9 % (ref 3.0–12.0)
Neutro Abs: 4.9 10*3/uL (ref 1.4–7.7)
Neutrophils Relative %: 46.2 % (ref 43.0–77.0)
Platelets: 202 10*3/uL (ref 150.0–400.0)
RBC: 4.26 Mil/uL (ref 3.87–5.11)
RDW: 16.1 % — AB (ref 11.5–15.5)
WBC: 10.6 10*3/uL — AB (ref 4.0–10.5)

## 2016-12-14 LAB — COMPREHENSIVE METABOLIC PANEL
ALBUMIN: 4.2 g/dL (ref 3.5–5.2)
ALK PHOS: 56 U/L (ref 39–117)
ALT: 17 U/L (ref 0–35)
AST: 10 U/L (ref 0–37)
BUN: 15 mg/dL (ref 6–23)
CHLORIDE: 100 meq/L (ref 96–112)
CO2: 38 mEq/L — ABNORMAL HIGH (ref 19–32)
Calcium: 9.6 mg/dL (ref 8.4–10.5)
Creatinine, Ser: 0.89 mg/dL (ref 0.40–1.20)
GFR: 84.03 mL/min (ref >60.00)
Glucose, Bld: 120 mg/dL — ABNORMAL HIGH (ref 70–99)
POTASSIUM: 3.2 meq/L — AB (ref 3.5–5.1)
Sodium: 144 mEq/L (ref 135–145)
TOTAL PROTEIN: 7.1 g/dL (ref 6.0–8.3)
Total Bilirubin: 0.6 mg/dL (ref 0.2–1.2)

## 2016-12-14 LAB — LIPID PANEL
CHOLESTEROL: 178 mg/dL (ref 0–200)
HDL: 84.1 mg/dL (ref >39.00)
LDL CALC: 78 mg/dL (ref 0–99)
NONHDL: 93.4
Total CHOL/HDL Ratio: 2
Triglycerides: 78 mg/dL (ref 0.0–149.0)
VLDL: 15.6 mg/dL (ref 0.0–40.0)

## 2016-12-14 LAB — HEMOGLOBIN A1C: HEMOGLOBIN A1C: 6.3 % (ref 4.6–6.5)

## 2016-12-20 ENCOUNTER — Ambulatory Visit (INDEPENDENT_AMBULATORY_CARE_PROVIDER_SITE_OTHER): Payer: BLUE CROSS/BLUE SHIELD | Admitting: Family Medicine

## 2016-12-20 ENCOUNTER — Encounter: Payer: Self-pay | Admitting: Family Medicine

## 2016-12-20 VITALS — BP 124/70 | HR 61 | Temp 97.6°F | Ht 64.0 in | Wt 198.0 lb

## 2016-12-20 DIAGNOSIS — Z124 Encounter for screening for malignant neoplasm of cervix: Secondary | ICD-10-CM

## 2016-12-20 DIAGNOSIS — I10 Essential (primary) hypertension: Secondary | ICD-10-CM

## 2016-12-20 DIAGNOSIS — Z0000 Encounter for general adult medical examination without abnormal findings: Secondary | ICD-10-CM | POA: Diagnosis not present

## 2016-12-20 DIAGNOSIS — R7303 Prediabetes: Secondary | ICD-10-CM | POA: Diagnosis not present

## 2016-12-20 DIAGNOSIS — M5442 Lumbago with sciatica, left side: Secondary | ICD-10-CM

## 2016-12-20 DIAGNOSIS — D696 Thrombocytopenia, unspecified: Secondary | ICD-10-CM | POA: Diagnosis not present

## 2016-12-20 DIAGNOSIS — E876 Hypokalemia: Secondary | ICD-10-CM

## 2016-12-20 DIAGNOSIS — M069 Rheumatoid arthritis, unspecified: Secondary | ICD-10-CM

## 2016-12-20 NOTE — Patient Instructions (Addendum)
Call if low back pain and left buttock leg pain not continuing to improve.. for referral to PT.  Gradually increase walking as able.  Increase potassium in diet.  Decrease sugar and carbohydrates in diet.  Hypokalemia Hypokalemia means that the amount of potassium in the blood is lower than normal.Potassium is a chemical that helps regulate the amount of fluid in the body (electrolyte). It also stimulates muscle tightening (contraction) and helps nerves work properly.Normally, most of the body's potassium is inside of cells, and only a very small amount is in the blood. Because the amount in the blood is so small, minor changes to potassium levels in the blood can be life-threatening. What are the causes? This condition may be caused by:  Antibiotic medicine.  Diarrhea or vomiting. Taking too much of a medicine that helps you have a bowel movement (laxative) can cause diarrhea and lead to hypokalemia.  Chronic kidney disease (CKD).  Medicines that help the body get rid of excess fluid (diuretics).  Eating disorders, such as bulimia.  Low magnesium levels in the body.  Sweating a lot. What are the signs or symptoms? Symptoms of this condition include:  Weakness.  Constipation.  Fatigue.  Muscle cramps.  Mental confusion.  Skipped heartbeats or irregular heartbeat (palpitations).  Tingling or numbness. How is this diagnosed? This condition is diagnosed with a blood test. How is this treated? Hypokalemia can be treated by taking potassium supplements by mouth or adjusting the medicines that you take. Treatment may also include eating more foods that contain a lot of potassium. If your potassium level is very low, you may need to get potassium through an IV tube in one of your veins and be monitored in the hospital. Follow these instructions at home:  Take over-the-counter and prescription medicines only as told by your health care provider. This includes vitamins and  supplements.  Eat a healthy diet. A healthy diet includes fresh fruits and vegetables, whole grains, healthy fats, and lean proteins.  If instructed, eat more foods that contain a lot of potassium, such as:  Nuts, such as peanuts and pistachios.  Seeds, such as sunflower seeds and pumpkin seeds.  Peas, lentils, and lima beans.  Whole grain and bran cereals and breads.  Fresh fruits and vegetables, such as apricots, avocado, bananas, cantaloupe, kiwi, oranges, tomatoes, asparagus, and potatoes.  Orange juice.  Tomato juice.  Red meats.  Yogurt.  Keep all follow-up visits as told by your health care provider. This is important. Contact a health care provider if:  You have weakness that gets worse.  You feel your heart pounding or racing.  You vomit.  You have diarrhea.  You have diabetes (diabetes mellitus) and you have trouble keeping your blood sugar (glucose) in your target range. Get help right away if:  You have chest pain.  You have shortness of breath.  You have vomiting or diarrhea that lasts for more than 2 days.  You faint. This information is not intended to replace advice given to you by your health care provider. Make sure you discuss any questions you have with your health care provider. Document Released: 11/07/2005 Document Revised: 06/25/2016 Document Reviewed: 06/25/2016 Elsevier Interactive Patient Education  2017 [employer].

## 2016-12-20 NOTE — Progress Notes (Signed)
Pre visit review using our clinic review tool, if applicable. No additional management support is needed unless otherwise documented below in the visit note. 

## 2016-12-20 NOTE — Progress Notes (Signed)
Subjective:    Patient ID: Rachel Rivas, female    DOB: [birth year], 56 y.o.   MRN: [id number]  HPI The patient is here for annual wellness exam and preventative care.     Left sided sciatica:  50% improvement after prednisone taper.  Doing home PT currently.  2016. Lumbar film: DD L4L5 and OA facets    Hypertension:    Good control on lisinopril HCTZ. BP Readings from Last 3 Encounters:  12/20/16 124/70  12/06/16 118/80  10/07/16 140/73  Using medication without problems or lightheadedness:  none Chest pain with exertion: none Edema:None Short of breath: none Average home BPs: 120/70s Other issues:   5 lb weight loss.  There is no height or weight on file to calculate BMI. Wt Readings from Last 3 Encounters:  12/20/16 198 lb (89.8 kg)  12/06/16 200 lb 8 oz (90.9 kg)  10/07/16 203 lb 8 oz (92.3 kg)   Prediabetes:  Lab Results  Component Value Date   HGBA1C 6.3 12/14/2016    RA: followed by Rheumatology: Dr. Marijean Bravo. Stable no flares on methotrexate.  Elevated Cholesterol:  Well controlled on no med. Lab Results  Component Value Date   CHOL 178 12/14/2016   HDL 84.10 12/14/2016   LDLCALC 78 12/14/2016   TRIG 78.0 12/14/2016   CHOLHDL 2 12/14/2016  Using medications without problems: Muscle aches:  Diet compliance:  She has been eating more  Exercise: walking 3 days a week in last few weeks. Other complaints:  Thrombocytopenia: Felt to be ITP versus secondary to methotrexate per HEME  recent platelet 202 12/14/2016.. Improved with pred. Hg borderline.  WBC higher with prednsione  Social History /Family History/Past Medical History reviewed and updated if needed.  Review of Systems  Constitutional: Negative for fatigue and fever.  HENT: Negative for congestion.   Eyes: Negative for pain.  Respiratory: Negative for cough and shortness of breath.   Cardiovascular: Negative for chest pain, palpitations and leg swelling.  Gastrointestinal: Negative for  abdominal pain.  Genitourinary: Negative for dysuria and vaginal bleeding.  Musculoskeletal: Positive for back pain. Negative for gait problem.  Neurological: Negative for syncope, light-headedness and headaches.  Psychiatric/Behavioral: Negative for dysphoric mood.       Objective:   Physical Exam  Constitutional: Vital signs are normal. She appears well-developed and well-nourished. She is cooperative.  Non-toxic appearance. She does not appear ill. No distress.  HENT:  Head: Normocephalic.  Right Ear: Hearing, tympanic membrane, external ear and ear canal normal. Tympanic membrane is not erythematous, not retracted and not bulging.  Left Ear: Hearing, tympanic membrane, external ear and ear canal normal. Tympanic membrane is not erythematous, not retracted and not bulging.  Nose: No mucosal edema or rhinorrhea. Right sinus exhibits no maxillary sinus tenderness and no frontal sinus tenderness. Left sinus exhibits no maxillary sinus tenderness and no frontal sinus tenderness.  Mouth/Throat: Uvula is midline, oropharynx is clear and moist and mucous membranes are normal.  Eyes: Conjunctivae, EOM and lids are normal. Pupils are equal, round, and reactive to light. Lids are everted and swept, no foreign bodies found.  Neck: Trachea normal and normal range of motion. Neck supple. Carotid bruit is not present. No thyroid mass and no thyromegaly present.  Cardiovascular: Normal rate, regular rhythm, S1 normal, S2 normal, normal heart sounds, intact distal pulses and normal pulses.  Exam reveals no gallop and no friction rub.   No murmur heard. Pulmonary/Chest: Effort normal and breath sounds normal. No tachypnea. No respiratory distress.  She has no decreased breath sounds. She has no wheezes. She has no rhonchi. She has no rales.  Abdominal: Soft. Normal appearance and bowel sounds are normal. There is no tenderness. Hernia confirmed negative in the right inguinal area and confirmed negative in the  left inguinal area.  Genitourinary: Vagina normal and uterus normal. No breast swelling, tenderness, discharge or bleeding. There is no rash or tenderness on the right labia. There is no rash or tenderness on the left labia. Uterus is not deviated, not enlarged, not fixed and not tender. Right adnexum displays no mass, no tenderness and no fullness. Left adnexum displays no mass, no tenderness and no fullness.  Musculoskeletal:       Lumbar back: She exhibits decreased range of motion, tenderness and bony tenderness.  Positive SLR on left, neg faber's ttp over left sciatic notch Antalgic gait  Lymphadenopathy:       Right: No inguinal adenopathy present.       Left: No inguinal adenopathy present.  Neurological: She is alert.  Skin: Skin is warm, dry and intact. No rash noted.  Psychiatric: Her speech is normal and behavior is normal. Judgment and thought content normal. Her mood appears not anxious. Cognition and memory are normal. She does not exhibit a depressed mood.          Assessment & Plan:  The patient's preventative maintenance and recommended screening tests for an annual wellness exam were reviewed in full today. Brought up to date unless services declined.  Counselled on the importance of diet, exercise, and its role in overall health and mortality. The patient's FH and SH was reviewed, including their home life, tobacco status, and drug and alcohol status.   Mammo: nml 12/14/2015, no concerns, plan mammo every 1-2 years, no breast exam today. Colon: 02/2016 adenoma, dr. Havery Moros, repeat in 5 years. Former smoker minimal use, 3 pack years, remote Vaccines: flu refused and tdap 2010 2013, 2014, 2015 nml pap, no HPV  Space to every 3 years, yearly DVE. Mother died with uterine cancer.   Hep C neg  HIV: refused

## 2016-12-21 ENCOUNTER — Other Ambulatory Visit (HOSPITAL_COMMUNITY)
Admission: RE | Admit: 2016-12-21 | Discharge: 2016-12-21 | Disposition: A | Discharge status: Home / Self Care | Payer: BLUE CROSS/BLUE SHIELD | Source: Ambulatory Visit | Attending: Family Medicine | Admitting: Family Medicine

## 2016-12-21 DIAGNOSIS — Z1151 Encounter for screening for human papillomavirus (HPV): Secondary | ICD-10-CM | POA: Diagnosis not present

## 2016-12-21 DIAGNOSIS — Z01419 Encounter for gynecological examination (general) (routine) without abnormal findings: Secondary | ICD-10-CM | POA: Diagnosis not present

## 2016-12-22 LAB — CYTOLOGY - PAP
DIAGNOSIS: NEGATIVE
HPV (WINDOPATH): NOT DETECTED

## 2016-12-23 ENCOUNTER — Encounter: Payer: Self-pay | Admitting: *Deleted

## 2017-01-04 ENCOUNTER — Telehealth: Payer: Self-pay

## 2017-01-04 DIAGNOSIS — M5442 Lumbago with sciatica, left side: Secondary | ICD-10-CM

## 2017-01-04 NOTE — Telephone Encounter (Signed)
Pt left v/m requesting cb about sending order for PT since still having pain in hip area. Pt seen 12/20/16 and if low back pain and lt buttock leg pain is not better pt to cb for PT.Please advise.

## 2017-01-05 ENCOUNTER — Telehealth: Payer: Self-pay | Admitting: Family Medicine

## 2017-01-05 NOTE — Telephone Encounter (Signed)
Pt went [facility] for [facility] yesterday to see margaret and pt was told they do not have referral/orders for pt to do Physical therapy.  Pt needs orders and or referral to do physical therapy  Fax number is [phone] Pt number is [phone] Pt request call back

## 2017-01-05 NOTE — Telephone Encounter (Signed)
Order placed

## 2017-01-06 NOTE — Telephone Encounter (Signed)
PT order was faxed and patient and Joycelyn Schmid aware.

## 2017-01-11 DIAGNOSIS — M5442 Lumbago with sciatica, left side: Secondary | ICD-10-CM | POA: Diagnosis not present

## 2017-01-11 DIAGNOSIS — R262 Difficulty in walking, not elsewhere classified: Secondary | ICD-10-CM | POA: Diagnosis not present

## 2017-01-11 DIAGNOSIS — M6281 Muscle weakness (generalized): Secondary | ICD-10-CM | POA: Diagnosis not present

## 2017-01-13 DIAGNOSIS — E876 Hypokalemia: Secondary | ICD-10-CM | POA: Insufficient documentation

## 2017-01-13 DIAGNOSIS — M6281 Muscle weakness (generalized): Secondary | ICD-10-CM | POA: Diagnosis not present

## 2017-01-13 DIAGNOSIS — R262 Difficulty in walking, not elsewhere classified: Secondary | ICD-10-CM | POA: Diagnosis not present

## 2017-01-13 DIAGNOSIS — M5442 Lumbago with sciatica, left side: Secondary | ICD-10-CM | POA: Diagnosis not present

## 2017-01-13 NOTE — Assessment & Plan Note (Signed)
Resolved. CBC    Component Value Date/Time   WBC 10.6 (H) 12/14/2016 0827   RBC 4.26 12/14/2016 0827   HGB 11.9 (L) 12/14/2016 0827   HGB 12.2 04/06/2016 1306   HCT 36.2 12/14/2016 0827   HCT 37.5 04/06/2016 1306   PLT 202.0 12/14/2016 0827   PLT 48 (L) 04/06/2016 1306   MCV 85.1 12/14/2016 0827   MCV 87.4 04/06/2016 1306   MCH 28.4 04/06/2016 1306   MCHC 32.9 12/14/2016 0827   RDW 16.1 (H) 12/14/2016 0827   RDW 15.4 (H) 04/06/2016 1306   LYMPHSABS 5.0 (H) 12/14/2016 0827   LYMPHSABS 2.2 04/06/2016 1306   MONOABS 0.6 12/14/2016 0827   MONOABS 0.4 04/06/2016 1306   EOSABS 0.1 12/14/2016 0827   EOSABS 0.1 04/06/2016 1306   BASOSABS 0.0 12/14/2016 0827   BASOSABS 0.0 04/06/2016 1306

## 2017-01-13 NOTE — Progress Notes (Signed)
Cancelled.  

## 2017-01-13 NOTE — Assessment & Plan Note (Signed)
Well controlled. Continue current medication.  

## 2017-01-13 NOTE — Assessment & Plan Note (Signed)
Low carb diet, exercise and weight loss.

## 2017-01-13 NOTE — Assessment & Plan Note (Signed)
Improved S/P pred taper

## 2017-01-13 NOTE — Assessment & Plan Note (Addendum)
Increase  potasium  In diet.

## 2017-01-13 NOTE — Assessment & Plan Note (Signed)
followed by Rheumatology: Dr. Marijean Bravo. Stable no flares on methotrexate.

## 2017-01-17 DIAGNOSIS — M5442 Lumbago with sciatica, left side: Secondary | ICD-10-CM | POA: Diagnosis not present

## 2017-01-17 DIAGNOSIS — R262 Difficulty in walking, not elsewhere classified: Secondary | ICD-10-CM | POA: Diagnosis not present

## 2017-01-17 DIAGNOSIS — M6281 Muscle weakness (generalized): Secondary | ICD-10-CM | POA: Diagnosis not present

## 2017-02-02 DIAGNOSIS — M0589 Other rheumatoid arthritis with rheumatoid factor of multiple sites: Secondary | ICD-10-CM | POA: Diagnosis not present

## 2017-02-02 DIAGNOSIS — D696 Thrombocytopenia, unspecified: Secondary | ICD-10-CM | POA: Diagnosis not present

## 2017-02-02 DIAGNOSIS — M542 Cervicalgia: Secondary | ICD-10-CM | POA: Diagnosis not present

## 2017-02-02 DIAGNOSIS — M5136 Other intervertebral disc degeneration, lumbar region: Secondary | ICD-10-CM | POA: Diagnosis not present

## 2017-02-28 ENCOUNTER — Ambulatory Visit (INDEPENDENT_AMBULATORY_CARE_PROVIDER_SITE_OTHER): Payer: BLUE CROSS/BLUE SHIELD | Admitting: Family Medicine

## 2017-02-28 ENCOUNTER — Encounter: Payer: Self-pay | Admitting: Family Medicine

## 2017-02-28 VITALS — BP 132/80 | HR 78 | Temp 98.6°F | Ht 64.0 in | Wt 193.5 lb

## 2017-02-28 DIAGNOSIS — M069 Rheumatoid arthritis, unspecified: Secondary | ICD-10-CM

## 2017-02-28 DIAGNOSIS — R7303 Prediabetes: Secondary | ICD-10-CM

## 2017-02-28 DIAGNOSIS — J018 Other acute sinusitis: Secondary | ICD-10-CM

## 2017-02-28 DIAGNOSIS — L209 Atopic dermatitis, unspecified: Secondary | ICD-10-CM | POA: Diagnosis not present

## 2017-02-28 DIAGNOSIS — L820 Inflamed seborrheic keratosis: Secondary | ICD-10-CM

## 2017-02-28 MED ORDER — AMOXICILLIN 500 MG PO CAPS: 1000.0000 mg | ORAL_CAPSULE | Freq: Two times a day (BID) | ORAL | 0 refills | Status: DC

## 2017-02-28 MED ORDER — TRIAMCINOLONE ACETONIDE 0.1 % EX CREA: 1.0000 "application " | TOPICAL_CREAM | Freq: Two times a day (BID) | CUTANEOUS | 0 refills | Status: DC

## 2017-02-28 NOTE — Progress Notes (Addendum)
Subjective:    Patient ID: Rachel Rivas, female    DOB: 1961-05-15, 56 y.o.   MRN: [id number]  Cough  This is a new problem. The current episode started 1 to 4 weeks ago. The problem has been unchanged. The cough is productive of sputum. Associated symptoms include headaches, nasal congestion, rhinorrhea, a sore throat, shortness of breath and wheezing. Pertinent negatives include no ear pain, fever, myalgias or postnasal drip. Associated symptoms comments: abd soreness from coughing  hoarse voice  bilateral facial pain. The symptoms are aggravated by lying down. Risk factors: nonsmoker. Treatments tried: mucinex Dm. The treatment provided mild relief. There is no history of asthma, COPD or environmental allergies. immunocompromised on methotrexate for RA  Shortness of Breath  Associated symptoms include headaches, rhinorrhea, a sore throat and wheezing. Pertinent negatives include no ear pain or fever. There is no history of asthma or COPD. (Immunocompromised on methotrexate for RA)   She has also noted 2 skin lesions on left face that have increased in size, they are irritated given their location, from rubbing. Not bleeding. She has multiple other SKs on bilaterally cheeks.  Needs refills of triamcinolone 0.1 % for atopic derm on hand, left  Review of Systems  Constitutional: Negative for fever.  HENT: Positive for rhinorrhea and sore throat. Negative for ear pain and postnasal drip.   Respiratory: Positive for cough, shortness of breath and wheezing.   Musculoskeletal: Negative for myalgias.  Allergic/Immunologic: Negative for environmental allergies.  Neurological: Positive for headaches.      Vitals:   02/28/17 1540  BP: 132/80  Pulse: 78  Temp: 98.6 F (37 C)       Objective:   Physical Exam  Constitutional: Vital signs are normal. She appears well-developed and well-nourished. She is cooperative.  Non-toxic appearance. She does not appear ill. No distress.  HENT:    Head: Normocephalic.  Right Ear: Hearing, tympanic membrane, external ear and ear canal normal. Tympanic membrane is not erythematous, not retracted and not bulging.  Left Ear: Hearing, tympanic membrane, external ear and ear canal normal. Tympanic membrane is not erythematous, not retracted and not bulging.  Nose: No mucosal edema or rhinorrhea. Right sinus exhibits no maxillary sinus tenderness and no frontal sinus tenderness. Left sinus exhibits no maxillary sinus tenderness and no frontal sinus tenderness.  Mouth/Throat: Uvula is midline, oropharynx is clear and moist and mucous membranes are normal.  Eyes: Conjunctivae, EOM and lids are normal. Pupils are equal, round, and reactive to light. Lids are everted and swept, no foreign bodies found.  Neck: Trachea normal and normal range of motion. Neck supple. Carotid bruit is not present. No thyroid mass and no thyromegaly present.  Cardiovascular: Normal rate, regular rhythm, S1 normal, S2 normal, normal heart sounds, intact distal pulses and normal pulses.  Exam reveals no gallop and no friction rub.   No murmur heard. Pulmonary/Chest: Effort normal and breath sounds normal. No tachypnea. No respiratory distress. She has no decreased breath sounds. She has no wheezes. She has no rhonchi. She has no rales.  Abdominal: Soft. Normal appearance and bowel sounds are normal. There is no tenderness.  Neurological: She is alert.  Skin: Skin is warm, dry and intact. No rash noted.  Feathery skin lesion x 2 on left cheek , one is pedunculated. irritated from rubbing.  Psychiatric: Her speech is normal and behavior is normal. Judgment and thought content normal. Her mood appears not anxious. Cognition and memory are normal. She does not exhibit a  depressed mood.          Assessment & Plan:

## 2017-02-28 NOTE — Patient Instructions (Addendum)
Complete antibiotics. Can use mucinex DM and possibly an antihistamine for possible allergy involment.  rest, fluids.  Call if not improving as expected.   Make appt for lab only visit, non-fasting after 4/24 for A1C.

## 2017-02-28 NOTE — Assessment & Plan Note (Addendum)
Procedure Note: Cryoptherapy performed on 2 lesions on left cheek with 2 mm halo x 3 , no complicaitons.  Covered with bandaid.  pt notified of care and expected course of healing.

## 2017-02-28 NOTE — Assessment & Plan Note (Signed)
Return for re-eval given lifestyle changes.

## 2017-02-28 NOTE — Progress Notes (Signed)
Pre visit review using our clinic review tool, if applicable. No additional management support is needed unless otherwise documented below in the visit note. 

## 2017-02-28 NOTE — Assessment & Plan Note (Signed)
Stable followed by Dr. Marijean Bravo Rheum. Immunocompromised on methotrextae.

## 2017-02-28 NOTE — Assessment & Plan Note (Signed)
Refill triamcinolone

## 2017-02-28 NOTE — Assessment & Plan Note (Signed)
Will treat with antibiotic given pt immunocompromised and minimal improvement past 7-10 days.  Also use mucolytic and nasal saline irrigation. Can consider covering  For possible allergies with antihistamine.

## 2017-03-16 ENCOUNTER — Other Ambulatory Visit: Payer: Self-pay | Admitting: Family Medicine

## 2017-03-27 ENCOUNTER — Other Ambulatory Visit (INDEPENDENT_AMBULATORY_CARE_PROVIDER_SITE_OTHER): Payer: BLUE CROSS/BLUE SHIELD

## 2017-03-27 DIAGNOSIS — R7303 Prediabetes: Secondary | ICD-10-CM

## 2017-03-27 LAB — HEMOGLOBIN A1C: Hgb A1c MFr Bld: 5.9 % (ref 4.6–6.5)

## 2017-03-28 ENCOUNTER — Encounter: Payer: Self-pay | Admitting: *Deleted

## 2017-04-07 ENCOUNTER — Ambulatory Visit (HOSPITAL_BASED_OUTPATIENT_CLINIC_OR_DEPARTMENT_OTHER): Payer: BLUE CROSS/BLUE SHIELD | Admitting: Oncology

## 2017-04-07 ENCOUNTER — Telehealth: Payer: Self-pay | Admitting: Oncology

## 2017-04-07 ENCOUNTER — Other Ambulatory Visit (HOSPITAL_BASED_OUTPATIENT_CLINIC_OR_DEPARTMENT_OTHER): Payer: BLUE CROSS/BLUE SHIELD

## 2017-04-07 VITALS — BP 138/64 | HR 62 | Temp 98.4°F | Resp 18 | Ht 64.0 in | Wt 189.3 lb

## 2017-04-07 DIAGNOSIS — M069 Rheumatoid arthritis, unspecified: Secondary | ICD-10-CM | POA: Diagnosis not present

## 2017-04-07 DIAGNOSIS — D696 Thrombocytopenia, unspecified: Secondary | ICD-10-CM

## 2017-04-07 LAB — COMPREHENSIVE METABOLIC PANEL
ALBUMIN: 4.4 g/dL (ref 3.5–5.0)
ALT: 23 U/L (ref 0–55)
AST: 18 U/L (ref 5–34)
Alkaline Phosphatase: 79 U/L (ref 40–150)
Anion Gap: 11 mEq/L (ref 3–11)
BUN: 16.8 mg/dL (ref 7.0–26.0)
CO2: 28 mEq/L (ref 22–29)
CREATININE: 0.9 mg/dL (ref 0.6–1.1)
Calcium: 9.9 mg/dL (ref 8.4–10.4)
Chloride: 103 mEq/L (ref 98–109)
EGFR: 82 mL/min/{1.73_m2} — ABNORMAL LOW (ref >90)
Glucose: 100 mg/dl (ref 70–140)
POTASSIUM: 3.8 meq/L (ref 3.5–5.1)
Sodium: 142 mEq/L (ref 136–145)
Total Bilirubin: 0.7 mg/dL (ref 0.20–1.20)
Total Protein: 8 g/dL (ref 6.4–8.3)

## 2017-04-07 LAB — CBC WITH DIFFERENTIAL/PLATELET
BASO%: 0.6 % (ref 0.0–2.0)
BASOS ABS: 0 10*3/uL (ref 0.0–0.1)
EOS ABS: 0.1 10*3/uL (ref 0.0–0.5)
EOS%: 1.9 % (ref 0.0–7.0)
HCT: 38.1 % (ref 34.8–46.6)
HGB: 12.4 g/dL (ref 11.6–15.9)
LYMPH#: 2.2 10*3/uL (ref 0.9–3.3)
LYMPH%: 43.1 % (ref 14.0–49.7)
MCH: 27.8 pg (ref 25.1–34.0)
MCHC: 32.6 g/dL (ref 31.5–36.0)
MCV: 85.3 fL (ref 79.5–101.0)
MONO#: 0.6 10*3/uL (ref 0.1–0.9)
MONO%: 10.8 % (ref 0.0–14.0)
NEUT#: 2.3 10*3/uL (ref 1.5–6.5)
NEUT%: 43.6 % (ref 38.4–76.8)
Platelets: 85 10*3/uL — ABNORMAL LOW (ref 145–400)
RBC: 4.46 10*6/uL (ref 3.70–5.45)
RDW: 15.4 % — AB (ref 11.2–14.5)
WBC: 5.2 10*3/uL (ref 3.9–10.3)

## 2017-04-07 LAB — CHCC SMEAR

## 2017-04-07 NOTE — Telephone Encounter (Signed)
Gave patient AVS and calender per 5/18 los. Lab and f/u in 6 months.

## 2017-04-07 NOTE — Progress Notes (Signed)
Hematology and Oncology Follow Up Visit  Ka Bench [id number] 31-Jan-1961 56 y.o. 04/07/2017 8:40 AM [name], MDBedsole, Amy E, MD   Principle Diagnosis: 56 year old woman with thrombocytopenia diagnosed April 2016. The diagnosis is related to ITP and autoimmune etiology related to rheumatoid arthritis.   Current therapy: Observation and surveillance.  Interim History: Ms. Navejas presents today for a follow-up visit. Since the last visit, she reports feeling reasonably well. She remains on methotrexate for her rheumatoid arthritis on the same weekly dose of 20 mg. She denied any recent flares or exacerbations. She denied any complications related to methotrexate. She has not been on prednisone recently. She has not reported any bleeding complications such as epistaxis, hematochezia or melena. Has not reported any lymphadenopathy or petechiae. She has not reported any spontaneous bruising.   She does not report any headaches, blurry vision, syncope or seizures. She does not report any fevers, chills, sweats. She does not report any cough or hemoptysis hematemesis. She does not report any nausea, vomiting, abdominal pain, hematochezia, melena, constipation or diarrhea. She does not report any frequency, urgency or hesitancy. She does not report any mood issues or depression. She does not report any lymphadenopathy. Remaining review of systems unremarkable.   Medications: I have reviewed the patient's current medications.  Current Outpatient Prescriptions  Medication Sig Dispense Refill  . amoxicillin (AMOXIL) 500 MG capsule Take 2 capsules (1,000 mg total) by mouth 2 (two) times daily. 40 capsule 0  . folic acid (FOLVITE) 1 MG tablet   3  . ibuprofen (ADVIL,MOTRIN) 800 MG tablet Take 1 tablet (800 mg total) by mouth every 8 (eight) hours as needed for moderate pain. 15 tablet 0  . losartan-hydrochlorothiazide (HYZAAR) 50-12.5 MG tablet TAKE ONE TABLET BY MOUTH ONCE DAILY 90 tablet 1  .  methotrexate (RHEUMATREX) 2.5 MG tablet Take 8 tablets by mouth once a week.    . mometasone (NASONEX) 50 MCG/ACT nasal spray Place 2 sprays into the nose as needed.    . predniSONE (DELTASONE) 20 MG tablet 3 tabs by mouth daily x 3 days, then 2 tabs by mouth daily x 2 days then 1 tab by mouth daily x 2 days 15 tablet 0  . ranitidine (ZANTAC) 150 MG tablet Take 1 tablet (150 mg total) by mouth 2 (two) times daily. 90 tablet 3  . traMADol (ULTRAM) 50 MG tablet Take 1 tablet (50 mg total) by mouth every 8 (eight) hours as needed. 15 tablet 0  . triamcinolone cream (KENALOG) 0.1 % Apply 1 application topically 2 (two) times daily. 15 g 0   Current Facility-Administered Medications  Medication Dose Route Frequency Provider Last Rate Last Dose  . 0.9 %  sodium chloride infusion  500 mL Intravenous Continuous Armbruster, Renelda Loma, MD         Allergies:  Allergies  Allergen Reactions  . Latex Itching, Dermatitis and Rash    Past Medical History, Surgical history, Social history, and Family History were reviewed and updated.   Physical Exam: Blood pressure 138/64, pulse 62, temperature 98.4 F (36.9 C), temperature source Oral, resp. rate 18, height 5\' 4"  (1.626 m), weight 189 lb 4.8 oz (85.9 kg), last menstrual period 11/17/2012, SpO2 98 %. ECOG: 0 General appearance: A well-appearing woman without distress. Head: Normocephalic, without obvious abnormality no oral ulcers or lesions. Neck: no adenopathy and no carotid bruit no thyroid masses. Lymph nodes: Cervical, supraclavicular, and axillary nodes normal. Heart:regular rate and rhythm, S1, S2 normal, no murmur, click, rub  or gallop Lung:chest clear, no wheezing, rales, normal symmetric air entry Abdomin: soft, non-tender, without masses or organomegaly  EXT:no erythema, induration, or nodules Skin showed no rashes, bruises or petechiae  Lab Results: Lab Results  Component Value Date   WBC 5.2 04/07/2017   HGB 12.4 04/07/2017    HCT 38.1 04/07/2017   MCV 85.3 04/07/2017   PLT 85 (L) 04/07/2017     Chemistry      Component Value Date/Time   NA 144 12/14/2016 0827   NA 141 10/07/2016 1007   K 3.2 (L) 12/14/2016 0827   K 3.6 10/07/2016 1007   CL 100 12/14/2016 0827   CO2 38 (H) 12/14/2016 0827   CO2 28 10/07/2016 1007   BUN 15 12/14/2016 0827   BUN 13.1 10/07/2016 1007   CREATININE 0.89 12/14/2016 0827   CREATININE 0.8 10/07/2016 1007      Component Value Date/Time   CALCIUM 9.6 12/14/2016 0827   CALCIUM 10.0 10/07/2016 1007   ALKPHOS 56 12/14/2016 0827   ALKPHOS 79 10/07/2016 1007   AST 10 12/14/2016 0827   AST 13 10/07/2016 1007   ALT 17 12/14/2016 0827   ALT 14 10/07/2016 1007   BILITOT 0.6 12/14/2016 0827   BILITOT 0.81 10/07/2016 1007       Impression and Plan:   56 year old woman with the following issues:  1. Thrombocytopenia: Diagnosed in 2014 due to ITP. Her platelet count has fluctuated since her diagnosis without requiring any treatments.  Her laboratory data were reviewed today and platelet count is 85. She has no signs or symptoms of bleeding and does not require any intervention. I recommended continued observation and surveillance and a trial of steroids or IVIG would be needed if her platelet count drop below 50 with active bleeding.  2. Rheumatoid arthritis: Managed with methotrexate without any flareups.  3. Follow-up: Will be in 6 months.   [facility], MD 5/18/20188:40 AM

## 2017-06-27 ENCOUNTER — Other Ambulatory Visit: Payer: Self-pay | Admitting: Family Medicine

## 2017-06-28 NOTE — Telephone Encounter (Signed)
Last office visit 02/28/2017.  Last refilled 02/28/2017 for 15 g with no refills. Ok to refill?

## 2017-07-11 DIAGNOSIS — M0589 Other rheumatoid arthritis with rheumatoid factor of multiple sites: Secondary | ICD-10-CM | POA: Diagnosis not present

## 2017-07-11 DIAGNOSIS — D696 Thrombocytopenia, unspecified: Secondary | ICD-10-CM | POA: Diagnosis not present

## 2017-07-11 DIAGNOSIS — M5136 Other intervertebral disc degeneration, lumbar region: Secondary | ICD-10-CM | POA: Diagnosis not present

## 2017-07-11 DIAGNOSIS — M542 Cervicalgia: Secondary | ICD-10-CM | POA: Diagnosis not present

## 2017-09-11 ENCOUNTER — Other Ambulatory Visit: Payer: Self-pay | Admitting: Family Medicine

## 2017-09-11 NOTE — Telephone Encounter (Signed)
Last office visit 02/28/2017.  Last refilled 06/29/2017 for 15 g with no refills.  Ok to refill?

## 2017-10-10 ENCOUNTER — Ambulatory Visit: Payer: BLUE CROSS/BLUE SHIELD | Admitting: Oncology

## 2017-10-10 ENCOUNTER — Ambulatory Visit (HOSPITAL_BASED_OUTPATIENT_CLINIC_OR_DEPARTMENT_OTHER): Payer: BLUE CROSS/BLUE SHIELD | Admitting: Oncology

## 2017-10-10 ENCOUNTER — Telehealth: Payer: Self-pay | Admitting: Oncology

## 2017-10-10 ENCOUNTER — Other Ambulatory Visit (HOSPITAL_BASED_OUTPATIENT_CLINIC_OR_DEPARTMENT_OTHER): Payer: BLUE CROSS/BLUE SHIELD

## 2017-10-10 ENCOUNTER — Other Ambulatory Visit: Payer: BLUE CROSS/BLUE SHIELD

## 2017-10-10 VITALS — BP 122/67 | HR 68 | Temp 98.0°F | Resp 18 | Ht 64.0 in | Wt 189.8 lb

## 2017-10-10 DIAGNOSIS — D696 Thrombocytopenia, unspecified: Secondary | ICD-10-CM

## 2017-10-10 DIAGNOSIS — M069 Rheumatoid arthritis, unspecified: Secondary | ICD-10-CM

## 2017-10-10 LAB — CBC WITH DIFFERENTIAL/PLATELET
BASO%: 0.5 % (ref 0.0–2.0)
Basophils Absolute: 0 10*3/uL (ref 0.0–0.1)
EOS ABS: 0.1 10*3/uL (ref 0.0–0.5)
EOS%: 1.7 % (ref 0.0–7.0)
HEMATOCRIT: 36.1 % (ref 34.8–46.6)
HEMOGLOBIN: 11.9 g/dL (ref 11.6–15.9)
LYMPH#: 2 10*3/uL (ref 0.9–3.3)
LYMPH%: 38.6 % (ref 14.0–49.7)
MCH: 28.2 pg (ref 25.1–34.0)
MCHC: 32.9 g/dL (ref 31.5–36.0)
MCV: 85.8 fL (ref 79.5–101.0)
MONO#: 0.6 10*3/uL (ref 0.1–0.9)
MONO%: 12 % (ref 0.0–14.0)
NEUT%: 47.2 % (ref 38.4–76.8)
NEUTROS ABS: 2.5 10*3/uL (ref 1.5–6.5)
PLATELETS: 86 10*3/uL — AB (ref 145–400)
RBC: 4.21 10*6/uL (ref 3.70–5.45)
RDW: 15.2 % — ABNORMAL HIGH (ref 11.2–14.5)
WBC: 5.2 10*3/uL (ref 3.9–10.3)

## 2017-10-10 NOTE — Telephone Encounter (Signed)
Gave avs and calendar for May 2019 °

## 2017-10-10 NOTE — Progress Notes (Signed)
Hematology and Oncology Follow Up Visit  Rachel Rivas [id number] 26-Feb-1961 56 y.o. 10/10/2017 8:39 AM [name], MDBedsole, Amy E, MD   Principle Diagnosis: 55 year old woman with thrombocytopenia diagnosed April 2016. The diagnosis is related to ITP and autoimmune etiology related to rheumatoid arthritis.   Current therapy: Observation and surveillance.  Interim History: Rachel Rivas presents today for a follow-up visit. Since the last visit, she reports no changes in her health. She denied any recent flares or exacerbations of her rheumatoid arthritis. She has not reported any bleeding complications such as epistaxis, hematochezia or melena. Has not reported any lymphadenopathy or petechiae. She has not reported any spontaneous bruising.  Her performance status and activity level continues to be excellent without any recent decline.  She does not report any headaches, blurry vision, syncope or seizures. She does not report any fevers, chills, sweats. She does not report any cough or hemoptysis hematemesis. She does not report any nausea, vomiting, abdominal pain, hematochezia, melena, constipation or diarrhea. She does not report any frequency, urgency or hesitancy. She does not report any mood issues or depression. She does not report any lymphadenopathy. Remaining review of systems unremarkable.   Medications: I have reviewed the patient's current medications.  Current Outpatient Medications  Medication Sig Dispense Refill  . folic acid (FOLVITE) 1 MG tablet   3  . ibuprofen (ADVIL,MOTRIN) 800 MG tablet Take 1 tablet (800 mg total) by mouth every 8 (eight) hours as needed for moderate pain. 15 tablet 0  . losartan-hydrochlorothiazide (HYZAAR) 50-12.5 MG tablet TAKE 1 TABLET BY MOUTH ONCE DAILY 90 tablet 1  . methotrexate (RHEUMATREX) 2.5 MG tablet Take 8 tablets by mouth once a week.    . predniSONE (DELTASONE) 20 MG tablet 3 tabs by mouth daily x 3 days, then 2 tabs by mouth daily x  2 days then 1 tab by mouth daily x 2 days (Patient taking differently: as needed. 3 tabs by mouth daily x 3 days, then 2 tabs by mouth daily x 2 days then 1 tab by mouth daily x 2 days) 15 tablet 0  . ranitidine (ZANTAC) 150 MG tablet Take 1 tablet (150 mg total) by mouth 2 (two) times daily. 90 tablet 3  . triamcinolone cream (KENALOG) 0.1 % APPLY CREAM EXTERNALLY TWICE DAILY 15 g 0   Current Facility-Administered Medications  Medication Dose Route Frequency Provider Last Rate Last Dose  . 0.9 %  sodium chloride infusion  500 mL Intravenous Continuous Armbruster, Carlota Raspberry, MD         Allergies:  Allergies  Allergen Reactions  . Latex Itching, Dermatitis and Rash    Past Medical History, Surgical history, Social history, and Family History were reviewed and updated.   Physical Exam: Blood pressure 122/67, pulse 68, temperature 98 F (36.7 C), temperature source Oral, resp. rate 18, height 5\' 4"  (1.626 m), weight 189 lb 12.8 oz (86.1 kg), last menstrual period 11/17/2012, SpO2 98 %. ECOG: 0 General appearance: Alert, awake woman without distress. Head: Normocephalic, without obvious abnormality no oral ulcers or thrush. Neck: no adenopathy and no carotid bruit no rebound or guarding. Lymph nodes: Cervical, supraclavicular, and axillary nodes normal. Heart:regular rate and rhythm, S1, S2 normal, no murmur, click, rub or gallop Lung:chest clear, no wheezing, rales, normal symmetric air entry Abdomin: soft, non-tender, without masses or organomegaly or masses. EXT:no erythema, induration, or nodules Skin showed no rashes, bruises or petechiae  Lab Results: Lab Results  Component Value Date   WBC 5.2  10/10/2017   HGB 11.9 10/10/2017   HCT 36.1 10/10/2017   MCV 85.8 10/10/2017   PLT 86 (L) 10/10/2017     Chemistry      Component Value Date/Time   NA 142 04/07/2017 0810   K 3.8 04/07/2017 0810   CL 100 12/14/2016 0827   CO2 28 04/07/2017 0810   BUN 16.8 04/07/2017 0810    CREATININE 0.9 04/07/2017 0810      Component Value Date/Time   CALCIUM 9.9 04/07/2017 0810   ALKPHOS 79 04/07/2017 0810   AST 18 04/07/2017 0810   ALT 23 04/07/2017 0810   BILITOT 0.70 04/07/2017 0810       Impression and Plan:   56 year old woman with the following issues:  1. Thrombocytopenia: Diagnosed in 2014 due to ITP. Her platelet count continues to be relatively stable around 85,000.   She has no signs or symptoms of bleeding and does not require any intervention.  The plan is to continue with active surveillance and consider steroids if her platelet count decreased below 50,000 or she develops any bleeding.  2. Rheumatoid arthritis: Appears to be under adequate control at this time.  3. Follow-up: Will be in 6 months.   Zola Button, MD 11/20/20188:39 AM

## 2017-10-24 DIAGNOSIS — D696 Thrombocytopenia, unspecified: Secondary | ICD-10-CM | POA: Diagnosis not present

## 2017-10-24 DIAGNOSIS — M5136 Other intervertebral disc degeneration, lumbar region: Secondary | ICD-10-CM | POA: Diagnosis not present

## 2017-10-24 DIAGNOSIS — M542 Cervicalgia: Secondary | ICD-10-CM | POA: Diagnosis not present

## 2017-10-24 DIAGNOSIS — M0589 Other rheumatoid arthritis with rheumatoid factor of multiple sites: Secondary | ICD-10-CM | POA: Diagnosis not present

## 2017-12-11 ENCOUNTER — Telehealth: Payer: Self-pay | Admitting: *Deleted

## 2017-12-11 MED ORDER — LOSARTAN POTASSIUM 50 MG PO TABS: 50.0000 mg | ORAL_TABLET | Freq: Every day | ORAL | 1 refills | Status: DC

## 2017-12-11 MED ORDER — HYDROCHLOROTHIAZIDE 12.5 MG PO CAPS: 12.5000 mg | ORAL_CAPSULE | Freq: Every day | ORAL | 1 refills | Status: DC

## 2017-12-11 NOTE — Telephone Encounter (Signed)
Separate prescriptions for Losartan 50 mg and HCTZ 12.5 mg sent to [facility]  on [address].

## 2017-12-11 NOTE — Telephone Encounter (Signed)
Received fax from [facility] stating Losartan/HCTZ 50-12.5 mg is on backorder until [facility] April 2019.  Would you like to call in something else?

## 2017-12-11 NOTE — Telephone Encounter (Signed)
Do they have those meds separate.. If so call in individually.

## 2017-12-19 ENCOUNTER — Telehealth: Payer: Self-pay | Admitting: Family Medicine

## 2017-12-19 DIAGNOSIS — D696 Thrombocytopenia, unspecified: Secondary | ICD-10-CM

## 2017-12-19 DIAGNOSIS — R7303 Prediabetes: Secondary | ICD-10-CM

## 2017-12-19 NOTE — Telephone Encounter (Signed)
-----   Message from Ellamae Sia sent at 12/13/2017  2:24 PM EST ----- Regarding: Lab orders for Wednesday, 1.30.19 Patient is scheduled for CPX labs, please order future labs, Thanks , Karna Christmas

## 2017-12-20 ENCOUNTER — Other Ambulatory Visit (INDEPENDENT_AMBULATORY_CARE_PROVIDER_SITE_OTHER): Payer: BLUE CROSS/BLUE SHIELD

## 2017-12-20 DIAGNOSIS — R7303 Prediabetes: Secondary | ICD-10-CM | POA: Diagnosis not present

## 2017-12-20 LAB — COMPREHENSIVE METABOLIC PANEL
ALT: 19 U/L (ref 0–35)
AST: 15 U/L (ref 0–37)
Albumin: 4.5 g/dL (ref 3.5–5.2)
Alkaline Phosphatase: 67 U/L (ref 39–117)
BILIRUBIN TOTAL: 0.8 mg/dL (ref 0.2–1.2)
BUN: 19 mg/dL (ref 6–23)
CO2: 32 meq/L (ref 19–32)
CREATININE: 0.82 mg/dL (ref 0.40–1.20)
Calcium: 9.6 mg/dL (ref 8.4–10.5)
Chloride: 102 mEq/L (ref 96–112)
GFR: 92.03 mL/min (ref >60.00)
Glucose, Bld: 113 mg/dL — ABNORMAL HIGH (ref 70–99)
Potassium: 3.9 mEq/L (ref 3.5–5.1)
Sodium: 140 mEq/L (ref 135–145)
Total Protein: 8.1 g/dL (ref 6.0–8.3)

## 2017-12-20 LAB — LIPID PANEL
CHOL/HDL RATIO: 2
Cholesterol: 176 mg/dL (ref 0–200)
HDL: 74 mg/dL (ref >39.00)
LDL Cholesterol: 88 mg/dL (ref 0–99)
NONHDL: 102.18
Triglycerides: 70 mg/dL (ref 0.0–149.0)
VLDL: 14 mg/dL (ref 0.0–40.0)

## 2017-12-20 LAB — HEMOGLOBIN A1C: Hgb A1c MFr Bld: 6 % (ref 4.6–6.5)

## 2017-12-28 ENCOUNTER — Ambulatory Visit: Payer: BLUE CROSS/BLUE SHIELD | Admitting: Family Medicine

## 2017-12-28 ENCOUNTER — Encounter: Payer: Self-pay | Admitting: Family Medicine

## 2017-12-28 ENCOUNTER — Other Ambulatory Visit: Payer: Self-pay

## 2017-12-28 VITALS — BP 122/80 | HR 73 | Temp 97.7°F | Ht 63.5 in | Wt 193.0 lb

## 2017-12-28 DIAGNOSIS — Z6833 Body mass index (BMI) 33.0-33.9, adult: Secondary | ICD-10-CM | POA: Diagnosis not present

## 2017-12-28 DIAGNOSIS — D696 Thrombocytopenia, unspecified: Secondary | ICD-10-CM

## 2017-12-28 DIAGNOSIS — E669 Obesity, unspecified: Secondary | ICD-10-CM

## 2017-12-28 DIAGNOSIS — M069 Rheumatoid arthritis, unspecified: Secondary | ICD-10-CM | POA: Diagnosis not present

## 2017-12-28 DIAGNOSIS — R7303 Prediabetes: Secondary | ICD-10-CM

## 2017-12-28 DIAGNOSIS — Z0000 Encounter for general adult medical examination without abnormal findings: Secondary | ICD-10-CM

## 2017-12-28 DIAGNOSIS — I10 Essential (primary) hypertension: Secondary | ICD-10-CM

## 2017-12-28 NOTE — Patient Instructions (Addendum)
Work on low [employer], try to increase exercise and weight loss as able.  Body mass index is 33.65 kg/m. Call to schedule mammogram on your own.

## 2017-12-28 NOTE — Assessment & Plan Note (Signed)
Encouraged exercise, weight loss, healthy eating habits. ? ?

## 2017-12-28 NOTE — Assessment & Plan Note (Signed)
Well controlled. Continue current medication.  

## 2017-12-28 NOTE — Assessment & Plan Note (Signed)
Followed by rheum. 

## 2017-12-28 NOTE — Progress Notes (Signed)
Subjective:    Patient ID: Rachel Rivas, female    DOB: 22-Nov-1960, 57 y.o.   MRN: [id number]  HPI   57 year old female presents for annual wellness exam and preventative care.    Hypertension:   Good control on losartan 50 mg daily, HCTZ   BP Readings from Last 3 Encounters:  12/28/17 122/80  10/10/17 122/67  04/07/17 138/64  Using medication without problems or lightheadedness:  none Chest pain with exertion:none Edema:none Short of breath: none Average home BPs:  Not checking Other issues: occ cramping.  Elevated Cholesterol:  Good control on no  medication  Lab Results  Component Value Date   CHOL 176 12/20/2017   HDL 74.00 12/20/2017   LDLCALC 88 12/20/2017   TRIG 70.0 12/20/2017   CHOLHDL 2 12/20/2017  Using medications without problems: Muscle aches:  Diet compliance: moderate Exercise: none Other complaints: Body mass index is 33.65 kg/m.   Prediabetes:  Lab Results  Component Value Date   HGBA1C 6.0 12/20/2017     RA: followed by Rheumatology: Dr. Marijean Bravo. Stable no flares on methotrexate.    Social History /Family History/Past Medical History reviewed in detail and updated in EMR if needed. Blood pressure 122/80, pulse 73, temperature 97.7 F (36.5 C), temperature source Oral, height 5' 3.5" (1.613 m), weight 193 lb (87.5 kg), last menstrual period 11/17/2012. Wt Readings from Last 3 Encounters:  12/28/17 193 lb (87.5 kg)  10/10/17 189 lb 12.8 oz (86.1 kg)  04/07/17 189 lb 4.8 oz (85.9 kg)     Review of Systems  Constitutional: Negative for fatigue and fever.  HENT: Negative for congestion.   Eyes: Negative for pain.  Respiratory: Negative for cough and shortness of breath.   Cardiovascular: Negative for chest pain, palpitations and leg swelling.  Gastrointestinal: Negative for abdominal pain.  Genitourinary: Negative for dysuria and vaginal bleeding.  Musculoskeletal: Negative for back pain.  Neurological: Negative for syncope,  light-headedness and headaches.  Psychiatric/Behavioral: Negative for dysphoric mood.       Objective:   Physical Exam  Constitutional: Vital signs are normal. She appears well-developed and well-nourished. She is cooperative.  Non-toxic appearance. She does not appear ill. No distress.  HENT:  Head: Normocephalic.  Right Ear: Hearing, tympanic membrane, external ear and ear canal normal.  Left Ear: Hearing, tympanic membrane, external ear and ear canal normal.  Nose: Nose normal.  Eyes: Conjunctivae, EOM and lids are normal. Pupils are equal, round, and reactive to light. Lids are everted and swept, no foreign bodies found.  Neck: Trachea normal and normal range of motion. Neck supple. Carotid bruit is not present. No thyroid mass and no thyromegaly present.  Cardiovascular: Normal rate, regular rhythm, S1 normal, S2 normal, normal heart sounds and intact distal pulses. Exam reveals no gallop.  No murmur heard. Pulmonary/Chest: Effort normal and breath sounds normal. No respiratory distress. She has no wheezes. She has no rhonchi. She has no rales.  Abdominal: Soft. Normal appearance and bowel sounds are normal. She exhibits no distension, no fluid wave, no abdominal bruit and no mass. There is no hepatosplenomegaly. There is no tenderness. There is no rebound, no guarding and no CVA tenderness. No hernia.  Genitourinary: Vagina normal and uterus normal. No breast swelling, tenderness, discharge or bleeding. Pelvic exam was performed with patient supine. There is no rash, tenderness or lesion on the right labia. There is no rash, tenderness or lesion on the left labia. Uterus is not enlarged and not tender. Cervix exhibits  no motion tenderness, no discharge and no friability. Right adnexum displays no mass, no tenderness and no fullness. Left adnexum displays no mass, no tenderness and no fullness.  Lymphadenopathy:    She has no cervical adenopathy.    She has no axillary adenopathy.    Neurological: She is alert. She has normal strength. No cranial nerve deficit or sensory deficit.  Skin: Skin is warm, dry and intact. No rash noted.  Psychiatric: Her speech is normal and behavior is normal. Judgment normal. Her mood appears not anxious. Cognition and memory are normal. She does not exhibit a depressed mood.          Assessment & Plan:  The patient's preventative maintenance and recommended screening tests for an annual wellness exam were reviewed in full today. Brought up to date unless services declined.  Counselled on the importance of diet, exercise, and its role in overall health and mortality. The patient's FH and SH was reviewed, including their home life, tobacco status, and drug and alcohol status.   Mammo: nml 12/14/2015, no concerns, plan mammo every 2 years,  breast exam today. Colon: 02/2016 adenoma, dr. Havery Moros, repeat in 5 years. Former smoker minimal use, 3 pack years, remote Vaccines: flu refused and tdap 2010 2018 nml pap, no HPV Space to every 5 years, yearly DVE. Mother died with uterine cancer.  Hep C neg HIV: refused Bone density: prednisone use in past.. o family history .Marland Kitchen Start bone density screening age 38.

## 2017-12-28 NOTE — Assessment & Plan Note (Addendum)
Stable. No bleeding .Marland Kitchen Followed by Dr. Alen Blew [facility]

## 2018-01-15 ENCOUNTER — Other Ambulatory Visit: Payer: Self-pay | Admitting: Family Medicine

## 2018-01-15 DIAGNOSIS — Z1231 Encounter for screening mammogram for malignant neoplasm of breast: Secondary | ICD-10-CM

## 2018-01-16 ENCOUNTER — Ambulatory Visit
Admission: RE | Admit: 2018-01-16 | Discharge: 2018-01-16 | Disposition: A | Discharge status: Home / Self Care | Payer: BLUE CROSS/BLUE SHIELD | Source: Ambulatory Visit | Attending: Family Medicine | Admitting: Family Medicine

## 2018-01-16 ENCOUNTER — Encounter (INDEPENDENT_AMBULATORY_CARE_PROVIDER_SITE_OTHER): Payer: Self-pay

## 2018-01-16 DIAGNOSIS — Z1231 Encounter for screening mammogram for malignant neoplasm of breast: Secondary | ICD-10-CM | POA: Diagnosis not present

## 2018-03-06 DIAGNOSIS — M5136 Other intervertebral disc degeneration, lumbar region: Secondary | ICD-10-CM | POA: Diagnosis not present

## 2018-03-06 DIAGNOSIS — M542 Cervicalgia: Secondary | ICD-10-CM | POA: Diagnosis not present

## 2018-03-06 DIAGNOSIS — M0589 Other rheumatoid arthritis with rheumatoid factor of multiple sites: Secondary | ICD-10-CM | POA: Diagnosis not present

## 2018-03-06 DIAGNOSIS — D696 Thrombocytopenia, unspecified: Secondary | ICD-10-CM | POA: Diagnosis not present

## 2018-03-23 ENCOUNTER — Encounter: Payer: Self-pay | Admitting: Family Medicine

## 2018-03-23 ENCOUNTER — Other Ambulatory Visit: Payer: Self-pay

## 2018-03-23 ENCOUNTER — Ambulatory Visit: Payer: BLUE CROSS/BLUE SHIELD | Admitting: Family Medicine

## 2018-03-23 DIAGNOSIS — M25511 Pain in right shoulder: Secondary | ICD-10-CM | POA: Diagnosis not present

## 2018-03-23 DIAGNOSIS — H6983 Other specified disorders of Eustachian tube, bilateral: Secondary | ICD-10-CM

## 2018-03-23 MED ORDER — DICLOFENAC SODIUM 75 MG PO TBEC: 75.0000 mg | DELAYED_RELEASE_TABLET | Freq: Two times a day (BID) | ORAL | 0 refills | Status: DC

## 2018-03-23 MED ORDER — CYCLOBENZAPRINE HCL 10 MG PO TABS: 10.0000 mg | ORAL_TABLET | Freq: Every evening | ORAL | 0 refills | Status: DC | PRN

## 2018-03-23 NOTE — Patient Instructions (Addendum)
Start home physical therapy.  Heat or ice on right shoulder.  Can use muscle relaxant at night.  Start diclofenac twice daily for pain and inflammation. Do not use prednisone or ibuprofen while on.  Start nasal flonase 2 spray per nostril daily.Marland Kitchen eustachian tube dysfunction and allergies.  If not improving can try zyrtec at bedtime in addition.

## 2018-03-23 NOTE — Assessment & Plan Note (Signed)
Treat with nasal steroid and oral antihistamine.

## 2018-03-23 NOTE — Progress Notes (Signed)
Subjective:    Patient ID: Rachel Rivas, female    DOB: 1961-03-04, 57 y.o.   MRN: [id number]  HPI   57 year old female pt with history of  RA presents with new onset right shoulder pain x 2 weeks.  She reports  riht posteriorshoulder pain, gradually worsening. Radiated down to hand.  Increased in pain with raising arm above head, int and ext rotation.. No fall, no change in activity.  No redness, no heat.  No numbness, no weakness   Minimal improvement with 6 day of 10 mg for prednisone.. This usually helps with RA but it did not help much. Ibuprofen has not helped much.   Also noted bilateral ear fullness in last 3 week. Pain in b ears, no drainage. Has congestion, sneeze, itchy eyes.   RA: Dr. Marijean Bravo. Stable no flares on methotrexate.   No history of surgery, or steroid injections  Blood pressure 100/60, pulse 77, temperature 98.8 F (37.1 C), temperature source Oral, height 5' 3.5" (1.613 m), weight 194 lb 8 oz (88.2 kg), last menstrual period 11/17/2012.  Review of Systems  Constitutional: Negative for fatigue and fever.  HENT: Positive for ear pain. Negative for congestion.   Eyes: Negative for pain.  Respiratory: Negative for cough, chest tightness and shortness of breath.   Cardiovascular: Negative for chest pain, palpitations and leg swelling.  Gastrointestinal: Negative for abdominal pain.  Genitourinary: Negative for dysuria and vaginal bleeding.  Musculoskeletal: Negative for back pain.  Neurological: Negative for syncope, light-headedness and headaches.  Psychiatric/Behavioral: Negative for dysphoric mood.       Objective:   Physical Exam  Constitutional: Vital signs are normal. She appears well-developed and well-nourished. She is cooperative.  Non-toxic appearance. She does not appear ill. No distress.  HENT:  Head: Normocephalic.  Right Ear: Hearing, external ear and ear canal normal. Tympanic membrane is not erythematous, not retracted and not  bulging. A middle ear effusion is present.  Left Ear: Hearing, external ear and ear canal normal. Tympanic membrane is not erythematous, not retracted and not bulging. A middle ear effusion is present.  Nose: Mucosal edema and rhinorrhea present. Right sinus exhibits no maxillary sinus tenderness and no frontal sinus tenderness. Left sinus exhibits no maxillary sinus tenderness and no frontal sinus tenderness.  Mouth/Throat: Uvula is midline, oropharynx is clear and moist and mucous membranes are normal.  Eyes: Pupils are equal, round, and reactive to light. Conjunctivae, EOM and lids are normal. Lids are everted and swept, no foreign bodies found.  Neck: Trachea normal and normal range of motion. Neck supple. Carotid bruit is not present. No thyroid mass and no thyromegaly present.  Cardiovascular: Normal rate, regular rhythm, S1 normal, S2 normal, normal heart sounds, intact distal pulses and normal pulses. Exam reveals no gallop and no friction rub.  No murmur heard. Pulmonary/Chest: Effort normal and breath sounds normal. No tachypnea. No respiratory distress. She has no decreased breath sounds. She has no wheezes. She has no rhonchi. She has no rales.  Musculoskeletal:       Right shoulder: She exhibits decreased range of motion, tenderness and bony tenderness.       Cervical back: She exhibits decreased range of motion and tenderness. She exhibits no bony tenderness.  ttp over trapezius and posterior subacromial space, positive neer's neg drop arm.  Pain with int, ext rotation and abduction.  Neg spurling.  Neurological: She is alert.  Skin: Skin is warm, dry and intact. No rash noted.  Psychiatric: Her  speech is normal and behavior is normal. Judgment normal. Her mood appears not anxious. Cognition and memory are normal. She does not exhibit a depressed mood.          Assessment & Plan:

## 2018-03-23 NOTE — Assessment & Plan Note (Signed)
No indication for X-ray. Not cleraly RA flare. Possible MSK spasm and Right rotator cufff irritation.  treat with NSAIDs, muscle relaxant, heat and home PT.  Follow up if not improving in 2 weeks.

## 2018-04-12 ENCOUNTER — Inpatient Hospital Stay: Payer: BLUE CROSS/BLUE SHIELD | Attending: Oncology | Admitting: Oncology

## 2018-04-12 ENCOUNTER — Inpatient Hospital Stay: Payer: BLUE CROSS/BLUE SHIELD

## 2018-05-28 ENCOUNTER — Emergency Department (HOSPITAL_COMMUNITY)
Admission: EM | Admit: 2018-05-28 | Discharge: 2018-05-28 | Disposition: A | Discharge status: Home / Self Care | Payer: BLUE CROSS/BLUE SHIELD | Attending: Emergency Medicine | Admitting: Emergency Medicine

## 2018-05-28 ENCOUNTER — Encounter (HOSPITAL_COMMUNITY): Payer: Self-pay

## 2018-05-28 DIAGNOSIS — I10 Essential (primary) hypertension: Secondary | ICD-10-CM | POA: Diagnosis not present

## 2018-05-28 DIAGNOSIS — D696 Thrombocytopenia, unspecified: Secondary | ICD-10-CM

## 2018-05-28 DIAGNOSIS — Z79899 Other long term (current) drug therapy: Secondary | ICD-10-CM | POA: Diagnosis not present

## 2018-05-28 DIAGNOSIS — Z87891 Personal history of nicotine dependence: Secondary | ICD-10-CM | POA: Diagnosis not present

## 2018-05-28 DIAGNOSIS — R42 Dizziness and giddiness: Secondary | ICD-10-CM | POA: Insufficient documentation

## 2018-05-28 DIAGNOSIS — D596 Hemoglobinuria due to hemolysis from other external causes: Secondary | ICD-10-CM | POA: Diagnosis not present

## 2018-05-28 DIAGNOSIS — R110 Nausea: Secondary | ICD-10-CM | POA: Diagnosis not present

## 2018-05-28 LAB — COMPREHENSIVE METABOLIC PANEL
ALT: 20 U/L (ref 0–44)
ANION GAP: 8 (ref 5–15)
AST: 17 U/L (ref 15–41)
Albumin: 4.4 g/dL (ref 3.5–5.0)
Alkaline Phosphatase: 69 U/L (ref 38–126)
BUN: 20 mg/dL (ref 6–20)
CHLORIDE: 104 mmol/L (ref 98–111)
CO2: 30 mmol/L (ref 22–32)
Calcium: 9.4 mg/dL (ref 8.9–10.3)
Creatinine, Ser: 0.81 mg/dL (ref 0.44–1.00)
Glucose, Bld: 110 mg/dL — ABNORMAL HIGH (ref 70–99)
Potassium: 3.6 mmol/L (ref 3.5–5.1)
SODIUM: 142 mmol/L (ref 135–145)
Total Bilirubin: 1 mg/dL (ref 0.3–1.2)
Total Protein: 8 g/dL (ref 6.5–8.1)

## 2018-05-28 LAB — CBC WITH DIFFERENTIAL/PLATELET
BASOS ABS: 0 10*3/uL (ref 0.0–0.1)
Basophils Relative: 0 %
Eosinophils Absolute: 0.1 10*3/uL (ref 0.0–0.7)
Eosinophils Relative: 2 %
HCT: 38.2 % (ref 36.0–46.0)
Hemoglobin: 12.4 g/dL (ref 12.0–15.0)
LYMPHS PCT: 42 %
Lymphs Abs: 2.6 10*3/uL (ref 0.7–4.0)
MCH: 28.6 pg (ref 26.0–34.0)
MCHC: 32.5 g/dL (ref 30.0–36.0)
MCV: 88 fL (ref 78.0–100.0)
MONO ABS: 0.5 10*3/uL (ref 0.1–1.0)
Monocytes Relative: 8 %
Neutro Abs: 2.9 10*3/uL (ref 1.7–7.7)
Neutrophils Relative %: 48 %
Platelets: 72 10*3/uL — ABNORMAL LOW (ref 150–400)
RBC: 4.34 MIL/uL (ref 3.87–5.11)
RDW: 15 % (ref 11.5–15.5)
WBC: 6.1 10*3/uL (ref 4.0–10.5)

## 2018-05-28 LAB — I-STAT TROPONIN, ED: TROPONIN I, POC: 0 ng/mL (ref 0.00–0.08)

## 2018-05-28 LAB — URINALYSIS, ROUTINE W REFLEX MICROSCOPIC
Bilirubin Urine: NEGATIVE
Glucose, UA: NEGATIVE mg/dL
HGB URINE DIPSTICK: NEGATIVE
KETONES UR: NEGATIVE mg/dL
LEUKOCYTES UA: NEGATIVE
Nitrite: NEGATIVE
PROTEIN: NEGATIVE mg/dL
Specific Gravity, Urine: 1.011 (ref 1.005–1.030)
pH: 5 (ref 5.0–8.0)

## 2018-05-28 MED ORDER — SODIUM CHLORIDE 0.9 % IV BOLUS
1000.0000 mL | Freq: Once | INTRAVENOUS | Status: AC
  Administered 2018-05-28: 1000 mL via INTRAVENOUS

## 2018-05-28 MED ORDER — MECLIZINE HCL 25 MG PO TABS: 25.0000 mg | ORAL_TABLET | Freq: Three times a day (TID) | ORAL | 0 refills | Status: DC | PRN

## 2018-05-28 MED ORDER — DIAZEPAM 5 MG/ML IJ SOLN
2.5000 mg | Freq: Once | INTRAMUSCULAR | Status: AC
  Administered 2018-05-28: 2.5 mg via INTRAVENOUS
  Filled 2018-05-28: qty 2

## 2018-05-28 MED ORDER — MECLIZINE HCL 25 MG PO TABS
25.0000 mg | ORAL_TABLET | Freq: Once | ORAL | Status: AC
  Administered 2018-05-28: 25 mg via ORAL
  Filled 2018-05-28: qty 1

## 2018-05-28 NOTE — Discharge Instructions (Addendum)
Try Epley Maneuever at home. Take meclizine as prescribed for dizziness. Drink plenty of fluids. Follow up with family doctor in two days for recheck. Return if worsening.

## 2018-05-28 NOTE — ED Notes (Signed)
Patient ambulated in hall with standby assistance. Patient reports "I feel much better, but I still feel really dizzy." Patient has some difficulty walking in a straight line, and often side steps. PA Tatyana visualized patient walking in the hallway.

## 2018-05-28 NOTE — ED Triage Notes (Signed)
Per EMS, pt complains of dizziness this morning since getting up to go to the bathroom. Pt states she has hx of vertigo, denies recent illness. Dizziness worsened when getting to seated position. Pt has not taken antihypertensive medication.   BP 144/90 HR 70 RR 20 O2 97% CBG 123 EMS EKG shows sinus rythm

## 2018-05-28 NOTE — ED Notes (Signed)
Bed: WA09 Expected date:  Expected time:  Means of arrival:  Comments: Ems dizziness

## 2018-05-28 NOTE — ED Provider Notes (Signed)
[facility] DEPT Provider Note   CSN: [id number] Arrival date & time: 05/28/18  0701     History   Chief Complaint Chief Complaint  Patient presents with  . Dizziness    HPI Rachel Rivas is a 57 y.o. female.  HPI Rachel Rivas is a 57 y.o. female with history of hypertension and rheumatoid arthritis, currently on methotrexate, presents to emergency department with complaint of dizziness.  Patient states she woke up this morning and noted that anytime she moves her head or looks to the side she has sensation of dizziness.  She feels like everything is spinning, however also states it feels like she is going to faint.  She denies any headaches, no nasal congestion or pressure in her sinuses or ears.  She does report frequent ear infections and sinus infections in the past.  She denies any head injuries.  She denies any chest pain, palpitations, shortness of breath.  She has had vertigo in the past but this feels much worse.  She reports associated nausea, no vomiting.  She states she is having difficulty walking due to this dizziness.  No medications taken prior to coming in.  Past Medical History:  Diagnosis Date  . Adenomatous colon polyp   . Allergy    occasionally  . Anemia    yrs ago, low platelets  . Arthritis    RA  . Blood transfusion without reported diagnosis    1985 with c section  . Borderline diabetic   . GERD (gastroesophageal reflux disease)   . Hypertension   . Thrombocytopenia [facility])     Patient Active Problem List   Diagnosis Date Noted  . ETD (Eustachian tube dysfunction), bilateral 03/23/2018  . Atopic dermatitis 02/28/2017  . Seborrheic keratoses, inflamed 02/28/2017  . Hypokalemia 01/13/2017  . Acute left-sided back pain with sciatica 03/15/2016  . Rheumatoid arthritis ([place]) 03/06/2015  . Thrombocytopenia ([place]) 02/26/2015  . Prediabetes 11/04/2014  . Acute sinus infection 05/26/2014  . Right shoulder pain 04/08/2014    . Hypertension     Past Surgical History:  Procedure Laterality Date  . CESAREAN SECTION    . COLONOSCOPY    . POLYPECTOMY    . STERILIZATION       OB History   None      Home Medications    Prior to Admission medications   Medication Sig Start Date End Date Taking? Authorizing Provider  cyclobenzaprine (FLEXERIL) 10 MG tablet Take 1 tablet (10 mg total) by mouth at bedtime as needed for muscle spasms. 03/23/18  Yes Bedsole, Amy E, MD  folic acid (FOLVITE) 1 MG tablet Take 2 mg by mouth daily.  08/03/16  Yes [provider]  hydrochlorothiazide (MICROZIDE) 12.5 MG capsule Take 1 capsule (12.5 mg total) by mouth daily. 12/11/17  Yes Bedsole, Amy E, MD  ibuprofen (ADVIL,MOTRIN) 800 MG tablet Take 1 tablet (800 mg total) by mouth every 8 (eight) hours as needed for moderate pain. 07/11/16  Yes Pleas Koch, NP  losartan (COZAAR) 50 MG tablet Take 1 tablet (50 mg total) by mouth daily. 12/11/17  Yes Bedsole, Amy E, MD  methotrexate (RHEUMATREX) 2.5 MG tablet Take 8 tablets by mouth once a week. 09/05/15  Yes [provider]  ranitidine (ZANTAC) 150 MG tablet Take 1 tablet (150 mg total) by mouth 2 (two) times daily. Patient taking differently: Take 150 mg by mouth 2 (two) times daily as needed for heartburn.  09/14/16  Yes Armbruster, Carlota Raspberry, MD  triamcinolone cream (KENALOG) 0.1 % APPLY CREAM EXTERNALLY TWICE DAILY Patient taking differently: apply to affected area twice daily as needed for skin allergy. 09/11/17  Yes Bedsole, Amy E, MD  diclofenac (VOLTAREN) 75 MG EC tablet Take 1 tablet (75 mg total) by mouth 2 (two) times daily. Patient not taking: Reported on 05/28/2018 03/23/18   [name], MD    Family History Family History  Problem Relation Age of Onset  . Lung cancer Mother   . Hypertension Mother   . Diabetes Mother   . Uterine cancer Mother   . Hypertension Father   . Diabetes Father   . Colon cancer Neg Hx   . Colon polyps Neg Hx   .  Esophageal cancer Neg Hx   . Rectal cancer Neg Hx   . Stomach cancer Neg Hx     Social History Social History   Tobacco Use  . Smoking status: Former Smoker    Packs/day: 0.25    Years: 12.00    Pack years: 3.00    Types: Cigarettes  . Smokeless tobacco: Never Used  Substance Use Topics  . Alcohol use: Yes    Alcohol/week: 0.0 oz    Comment: occasional  . Drug use: No    Comment: past; marijuana     Allergies   Latex   Review of Systems Review of Systems  Constitutional: Negative for chills and fever.  HENT: Negative for congestion and ear pain.   Respiratory: Negative for cough, chest tightness and shortness of breath.   Cardiovascular: Negative for chest pain, palpitations and leg swelling.  Gastrointestinal: Negative for abdominal pain, diarrhea, nausea and vomiting.  Genitourinary: Negative for dysuria, flank pain and pelvic pain.  Musculoskeletal: Negative for arthralgias, myalgias, neck pain and neck stiffness.  Skin: Negative for rash.  Neurological: Positive for dizziness and light-headedness. Negative for weakness and headaches.  All other systems reviewed and are negative.    Physical Exam Updated Vital Signs BP (!) 145/85 (BP Location: Right Arm)   Pulse 63   Temp 97.7 F (36.5 C)   Resp 14   Ht 5\' 3"  (1.6 m)   Wt 88.5 kg (195 lb)   LMP 11/17/2012   SpO2 99%   BMI 34.54 kg/m   Physical Exam  Constitutional: She is oriented to person, place, and time. She appears well-developed and well-nourished. No distress.  HENT:  Head: Normocephalic.  Eyes: Pupils are equal, round, and reactive to light. Conjunctivae and EOM are normal.  Neck: Neck supple.  Cardiovascular: Normal rate, regular rhythm and normal heart sounds.  Pulmonary/Chest: Effort normal and breath sounds normal. No respiratory distress. She has no wheezes. She has no rales.  Abdominal: Soft. Bowel sounds are normal. She exhibits no distension. There is no tenderness. There is no  rebound.  Musculoskeletal: She exhibits no edema.  Neurological: She is alert and oriented to person, place, and time. She displays normal reflexes. No cranial nerve deficit. Coordination normal.  5/5 and equal upper and lower extremity strength bilaterally. Equal grip strength bilaterally. Normal finger to nose and heel to shin. No pronator drift.   Skin: Skin is warm and dry.  Psychiatric: She has a normal mood and affect. Her behavior is normal.  Nursing note and vitals reviewed.    ED Treatments / Results  Labs (all labs ordered are listed, but only abnormal results are displayed) Labs Reviewed  CBC WITH DIFFERENTIAL/PLATELET - Abnormal; Notable for the following components:      Result Value  Platelets 72 (*)    All other components within normal limits  COMPREHENSIVE METABOLIC PANEL - Abnormal; Notable for the following components:   Glucose, Bld 110 (*)    All other components within normal limits  URINALYSIS, ROUTINE W REFLEX MICROSCOPIC - Abnormal; Notable for the following components:   Color, Urine STRAW (*)    All other components within normal limits  I-STAT TROPONIN, ED    EKG EKG Interpretation  Date/Time:  Monday May 28 2018 07:43:21 EDT Ventricular Rate:  59 PR Interval:    QRS Duration: 101 QT Interval:  415 QTC Calculation: 412 R Axis:   57 Text Interpretation:  Sinus rhythm Non-specific ST-t changes Confirmed by Virgel Manifold [phone]) on 05/28/2018 7:46:50 AM   Radiology No results found.  Procedures Procedures (including critical care time)  Medications Ordered in ED Medications  sodium chloride 0.9 % bolus 1,000 mL (0 mLs Intravenous Stopped 05/28/18 0952)  meclizine (ANTIVERT) tablet 25 mg (25 mg Oral Given 05/28/18 0750)  diazepam (VALIUM) injection 2.5 mg (2.5 mg Intravenous Given 05/28/18 0855)     Initial Impression / Assessment and Plan / ED Course  I have reviewed the triage vital signs and the nursing notes.  Pertinent labs & imaging  results that were available during my care of the patient were reviewed by me and considered in my medical decision making (see chart for details).     Pt in ED with vertigo like symptoms. VS normal. NAD. Will check labs, orthostatics, try meclizine.   8:50 AM Labs unremarkable other than low platelet count, which appears to be baseline for her.  She did not have any improvement after meclizine.  She just now receiving IV fluids.  We will try Valium.  10:04 AM Pt feels better, although still reports some dizziness. She was able to sit up and ambulate which she could not do before. While ambulating she still reported some balance problems, and some dizziness, but much better. Discussed with Dr. Wilson Singer who has seen her as well. Most likely peripheral vertigo. No risk factors or concern at this time for central cause for her vertigo. Will dc home with meclizine. Will have her follow up closely outpatient. Advised to come back if worsening or new symptoms. Pt agreed.   Vitals:   05/28/18 0706 05/28/18 0747 05/28/18 0853  BP: (!) 145/85  139/85  Pulse: 63  67  Resp: 14  13  Temp: 97.7 F (36.5 C) 97.7 F (36.5 C)   TempSrc: Oral    SpO2: 99%  95%  Weight: 88.5 kg (195 lb)    Height: 5\' 3"  (1.6 m)       Final Clinical Impressions(s) / ED Diagnoses   Final diagnoses:  Vertigo  Thrombocytopenia [facility])    ED Discharge Orders        Ordered    meclizine (ANTIVERT) 25 MG tablet  3 times daily PRN     05/28/18 1006       Jeannett Senior, PA-C 05/28/18 1008    Virgel Manifold, MD 05/28/18 [phone]

## 2018-05-31 ENCOUNTER — Ambulatory Visit: Payer: BLUE CROSS/BLUE SHIELD | Admitting: Family Medicine

## 2018-05-31 ENCOUNTER — Encounter: Payer: Self-pay | Admitting: Family Medicine

## 2018-05-31 DIAGNOSIS — H8110 Benign paroxysmal vertigo, unspecified ear: Secondary | ICD-10-CM | POA: Insufficient documentation

## 2018-05-31 DIAGNOSIS — H8111 Benign paroxysmal vertigo, right ear: Secondary | ICD-10-CM | POA: Diagnosis not present

## 2018-05-31 NOTE — Progress Notes (Signed)
Subjective:    Patient ID: Rachel Rivas, female    DOB: 11-04-1961, 57 y.o.   MRN: [id number]  HPI    57 year old female presents for  overnight hospital follow up.  Admitted on 05/28/2018 overnight for dizziness  Summary as follows: Patient states she woke up this morning and noted that anytime she moves her head or looks to the side she has sensation of dizziness.  She feels like everything is spinning, however also states it feels like she is going to faint.  She denies any headaches, no nasal congestion or pressure in her sinuses or ears.  She does report frequent ear infections and sinus infections in the past.  She denies any head injuries.  She denies any chest pain, palpitations, shortness of breath.  She has had vertigo in the past but this feels much worse.  She reports associated nausea, no vomiting.  She states she is having difficulty walking due to this dizziness.  Vitals normal BP 145/85  Labs nml except plt 72 ( baseline for her), neg tropinins  EKG unremarkable  Pt was given valium and antivert as well as a bolus of IVF.  Dx with BPPV  05/31/18 Today:   She reports she has had some improvement in her symptoms. She is now able to walk. Still having vertigo less severely when moving. No symptoms at rest.  She has done home Eply maneuver a few times... Not consistently.   Social History /Family History/Past Medical History reviewed in detail and updated in EMR if needed. Blood pressure 124/84, pulse 64, temperature 97.8 F (36.6 C), temperature source Oral, height 5\' 3"  (1.6 m), weight 198 lb (89.8 kg), last menstrual period 11/17/2012, SpO2 97 %.  Review of Systems  Constitutional: Negative for fatigue and fever.  HENT: Negative for congestion.   Eyes: Negative for pain.  Respiratory: Negative for cough and shortness of breath.   Cardiovascular: Negative for chest pain, palpitations and leg swelling.  Gastrointestinal: Negative for abdominal pain.  Genitourinary:  Negative for dysuria and vaginal bleeding.  Musculoskeletal: Negative for back pain.  Neurological: Negative for syncope, light-headedness and headaches.  Psychiatric/Behavioral: Negative for dysphoric mood.       Objective:   Physical Exam  Constitutional: Vital signs are normal. She appears well-developed and well-nourished. She is cooperative.  Non-toxic appearance. She does not appear ill. No distress.  HENT:  Head: Normocephalic.  Right Ear: Hearing, tympanic membrane, external ear and ear canal normal. Tympanic membrane is not erythematous, not retracted and not bulging.  Left Ear: Hearing, tympanic membrane, external ear and ear canal normal. Tympanic membrane is not erythematous, not retracted and not bulging.  Nose: No mucosal edema or rhinorrhea. Right sinus exhibits no maxillary sinus tenderness and no frontal sinus tenderness. Left sinus exhibits no maxillary sinus tenderness and no frontal sinus tenderness.  Mouth/Throat: Uvula is midline, oropharynx is clear and moist and mucous membranes are normal.  Eyes: Pupils are equal, round, and reactive to light. Conjunctivae, EOM and lids are normal. Lids are everted and swept, no foreign bodies found.  Neck: Trachea normal and normal range of motion. Neck supple. Carotid bruit is not present. No thyroid mass and no thyromegaly present.  Cardiovascular: Normal rate, regular rhythm, S1 normal, S2 normal, normal heart sounds, intact distal pulses and normal pulses. Exam reveals no gallop and no friction rub.  No murmur heard. Pulmonary/Chest: Effort normal and breath sounds normal. No tachypnea. No respiratory distress. She has no decreased breath sounds.  She has no wheezes. She has no rhonchi. She has no rales.  Abdominal: Soft. Normal appearance and bowel sounds are normal. There is no tenderness.  Neurological: She is alert.   Nystagmus with particular head movements  Skin: Skin is warm, dry and intact. No rash noted.  Psychiatric:  Her speech is normal and behavior is normal. Judgment and thought content normal. Her mood appears not anxious. Cognition and memory are normal. She does not exhibit a depressed mood.          Assessment & Plan:

## 2018-05-31 NOTE — Patient Instructions (Addendum)
Continue desensitization exercises. Okay to stop daytime use of meclizine given fatigue.

## 2018-05-31 NOTE — Assessment & Plan Note (Signed)
Gradually improving symptoms with desensitization exercise. Pt is not able to drive. Continue. If not improving in next 1 week... Consider ENT referral or [facility].

## 2018-06-01 ENCOUNTER — Encounter: Payer: Self-pay | Admitting: Family Medicine

## 2018-06-01 NOTE — Telephone Encounter (Signed)
Forms printed and placed in Dr. Rometta Emery in box to complete.

## 2018-06-04 ENCOUNTER — Telehealth: Payer: Self-pay | Admitting: Family Medicine

## 2018-06-04 NOTE — Telephone Encounter (Signed)
sure

## 2018-06-04 NOTE — Telephone Encounter (Signed)
fmla paperwork in dr Diona Browner in [employer]

## 2018-06-05 DIAGNOSIS — Z0279 Encounter for issue of other medical certificate: Secondary | ICD-10-CM

## 2018-06-05 DIAGNOSIS — M0589 Other rheumatoid arthritis with rheumatoid factor of multiple sites: Secondary | ICD-10-CM | POA: Diagnosis not present

## 2018-06-05 NOTE — Telephone Encounter (Signed)
Complete FMLA for mother and daughter. In out box ready to fax back.

## 2018-06-06 ENCOUNTER — Telehealth: Payer: Self-pay | Admitting: Family Medicine

## 2018-06-06 NOTE — Telephone Encounter (Signed)
Spoke with pt.  She wanted to know if you could refer her to ENT or do you need to see her before referral.  She stated is a little better but still off.  She stated she almost fell  Yesterday.

## 2018-06-06 NOTE — Telephone Encounter (Signed)
Called and informed patient of Dr. Rometta Emery request. Understanding verbalized patient will give it more time and give the office a call if she still thinks a referral is needed.

## 2018-06-06 NOTE — Telephone Encounter (Signed)
I sent her a mychart note. I think she should give it a little more time... 1 week... It takes time to resolve.  She does not need to see me again.. If she still wants a referral now let me know.

## 2018-06-08 ENCOUNTER — Ambulatory Visit: Payer: BLUE CROSS/BLUE SHIELD | Admitting: Family Medicine

## 2018-06-08 ENCOUNTER — Encounter: Payer: Self-pay | Admitting: Family Medicine

## 2018-06-08 VITALS — BP 118/60 | HR 75 | Ht 63.0 in | Wt 197.0 lb

## 2018-06-08 DIAGNOSIS — M5442 Lumbago with sciatica, left side: Secondary | ICD-10-CM

## 2018-06-08 MED ORDER — PREDNISONE 20 MG PO TABS: ORAL_TABLET | ORAL | 0 refills | Status: DC

## 2018-06-08 NOTE — Patient Instructions (Signed)
Take the prednisone as directed  Take flexeril as needed - 1/2 to 1 pill up to three times daily (it is sedating)  Use heat for 10 minutes at a time if it feels good Slow walking is a good idea   We will refer you to PT (physical therapy)   If symptoms suddenly worsen please alert [place]

## 2018-06-08 NOTE — Progress Notes (Signed)
Subjective:    Patient ID: Rachel Rivas, female    DOB: [birth year], 59 y.o.   MRN: [id number]  HPI Here for low back pain   Wt Readings from Last 3 Encounters:  06/08/18 197 lb (89.4 kg)  05/31/18 198 lb (89.8 kg)  05/28/18 195 lb (88.5 kg)   34.90 kg/m  57 yo pt of dr Rachel Rivas   Hx of L sided back pain with sciatica in the past This hurts more  Is on the L side again  Legs feel weak / hurts to walk  Prednisone taper helped in the past  No numbness  No weakness /focal  No loss of b/b control   Took 800 mg ibuprofen last night-not helping  Has not tried flexeril -due to sedation  Hard to get comfortable in bed  Hurts to bend all directions   Had vertigo last week - was off/ almost fell twice and that could have tweaked her back  Gradually improving     Last XR was 12/16DG Lumbar Spine Complete (Accession [id number]) (Order [id number])  Imaging  Date: 11/05/2015 Department: [place] [facility] Ordering/Authorizing: Rachel Koch, NP  Exam Information   Status Exam Begun  Exam Ended   Final [99] 11/05/2015 12:01 PM 11/05/2015 12:01 PM  PACS Images   Show images for DG Lumbar Spine Complete  Study Result   CLINICAL DATA:  One day of low back pain with left leg weakness ; symptoms increased with forward flexion.  EXAM: LUMBAR SPINE - COMPLETE 4+ VIEW  COMPARISON:  Lumbar spine series of October 29, 2012  FINDINGS: There is stable mild levocurvature centered at L4. The lumbar vertebral bodies are preserved in height. The pedicles and transverse processes are intact. There is moderate disc space narrowing at L4-5 which has progressed since the previous study. There is mild facet joint hypertrophy at L4-5 and at L5-S1.  IMPRESSION: Increased disc space loss at L4-5 consistent with degenerative disc disease. There is mild to moderate facet joint hypertrophy at L4-5 and at L5-S1. There is no compression fracture or  spondylolisthesis.   Electronically Signed   By: Rachel  Rivas M.D.   On: 11/05/2015 15:16   Has RA Takes methotrexate   Patient Active Problem List   Diagnosis Date Noted  . BPPV (benign paroxysmal positional vertigo), right 05/31/2018  . ETD (Eustachian tube dysfunction), bilateral 03/23/2018  . Atopic dermatitis 02/28/2017  . Seborrheic keratoses, inflamed 02/28/2017  . Hypokalemia 01/13/2017  . Acute left-sided back pain with sciatica 03/15/2016  . Rheumatoid arthritis ([place]) 03/06/2015  . Thrombocytopenia ([place]) 02/26/2015  . Prediabetes 11/04/2014  . Acute sinus infection 05/26/2014  . Right shoulder pain 04/08/2014  . Hypertension    Past Medical History:  Diagnosis Date  . Adenomatous colon polyp   . Allergy    occasionally  . Anemia    yrs ago, low platelets  . Arthritis    RA  . Blood transfusion without reported diagnosis    1985 with c section  . Borderline diabetic   . GERD (gastroesophageal reflux disease)   . Hypertension   . Thrombocytopenia ([place])    Past Surgical History:  Procedure Laterality Date  . CESAREAN SECTION    . COLONOSCOPY    . POLYPECTOMY    . STERILIZATION     Social History   Tobacco Use  . Smoking status: Former Smoker    Packs/day: 0.25    Years: 12.00    Pack years: 3.00  Types: Cigarettes  . Smokeless tobacco: Never Used  Substance Use Topics  . Alcohol use: Yes    Alcohol/week: 0.0 oz    Comment: occasional  . Drug use: No    Comment: past; marijuana   Family History  Problem Relation Age of Onset  . Lung cancer Mother   . Hypertension Mother   . Diabetes Mother   . Uterine cancer Mother   . Hypertension Father   . Diabetes Father   . Colon cancer Neg Hx   . Colon polyps Neg Hx   . Esophageal cancer Neg Hx   . Rectal cancer Neg Hx   . Stomach cancer Neg Hx    Allergies  Allergen Reactions  . Latex Itching, Dermatitis and Rash   Current Outpatient Medications on File Prior to Visit  Medication Sig  Dispense Refill  . cyclobenzaprine (FLEXERIL) 10 MG tablet Take 1 tablet (10 mg total) by mouth at bedtime as needed for muscle spasms. 15 tablet 0  . folic acid (FOLVITE) 1 MG tablet Take 2 mg by mouth daily.   3  . hydrochlorothiazide (MICROZIDE) 12.5 MG capsule Take 1 capsule (12.5 mg total) by mouth daily. 90 capsule 1  . ibuprofen (ADVIL,MOTRIN) 800 MG tablet Take 1 tablet (800 mg total) by mouth every 8 (eight) hours as needed for moderate pain. 15 tablet 0  . losartan (COZAAR) 50 MG tablet Take 1 tablet (50 mg total) by mouth daily. 90 tablet 1  . meclizine (ANTIVERT) 25 MG tablet Take 1 tablet (25 mg total) by mouth 3 (three) times daily as needed for dizziness. 30 tablet 0  . methotrexate (RHEUMATREX) 2.5 MG tablet Take 8 tablets by mouth once a week.    . ranitidine (ZANTAC) 150 MG tablet Take 1 tablet (150 mg total) by mouth 2 (two) times daily. (Patient taking differently: Take 150 mg by mouth 2 (two) times daily as needed for heartburn. ) 90 tablet 3  . triamcinolone cream (KENALOG) 0.1 % APPLY CREAM EXTERNALLY TWICE DAILY (Patient taking differently: apply to affected area twice daily as needed for skin allergy.) 15 g 0   Current Facility-Administered Medications on File Prior to Visit  Medication Dose Route Frequency Provider Last Rate Last Dose  . 0.9 %  sodium chloride infusion  500 mL Intravenous Continuous Armbruster, Carlota Raspberry, MD         Review of Systems  Constitutional: Negative for activity change, appetite change, fatigue, fever and unexpected weight change.  HENT: Negative for congestion, ear pain, rhinorrhea, sinus pressure and sore throat.   Eyes: Negative for pain, redness and visual disturbance.  Respiratory: Negative for cough, shortness of breath and wheezing.   Cardiovascular: Negative for chest pain and palpitations.  Gastrointestinal: Negative for abdominal pain, blood in stool, constipation and diarrhea.  Endocrine: Negative for polydipsia and polyuria.    Genitourinary: Negative for dysuria, frequency and urgency.  Musculoskeletal: Positive for back pain. Negative for arthralgias and myalgias.       Left low back pain with sciatica   Skin: Negative for pallor and rash.  Allergic/Immunologic: Negative for environmental allergies.  Neurological: Negative for dizziness, syncope and headaches.  Hematological: Negative for adenopathy. Does not bruise/bleed easily.  Psychiatric/Behavioral: Negative for decreased concentration and dysphoric mood. The patient is not nervous/anxious.        Objective:   Physical Exam  Constitutional: She appears well-developed and well-nourished. No distress.  obese and well appearing   HENT:  Head: Normocephalic and atraumatic.  Eyes:  Pupils are equal, round, and reactive to light. Conjunctivae and EOM are normal. No scleral icterus.  Neck: Normal range of motion. Neck supple.  Cardiovascular: Normal rate and regular rhythm.  Pulmonary/Chest: Effort normal and breath sounds normal. She has no wheezes. She has no rales.  Abdominal: Soft. Bowel sounds are normal. She exhibits no distension. There is no tenderness.  Musculoskeletal: She exhibits tenderness.       Right shoulder: She exhibits decreased range of motion, tenderness, bony tenderness and spasm. She exhibits no swelling, no effusion, no crepitus, no deformity and normal pulse.       Lumbar back: She exhibits decreased range of motion, tenderness and spasm. She exhibits no bony tenderness and no edema.  Tender over lower LS and also L lumbar musculature  Flex 10 deg with pain  Ext- full Pain to laterally flex L   No neuro changes   Bent knee raise yields back and buttock pain   Lymphadenopathy:    She has no cervical adenopathy.  Neurological: She is alert. She has normal strength and normal reflexes. She displays no atrophy. No cranial nerve deficit or sensory deficit. She exhibits normal muscle tone. Coordination normal.  Negative SLR  Skin:  Skin is warm and dry. No rash noted. No erythema. No pallor.  Psychiatric: She has a normal mood and affect.          Assessment & Plan:   Problem List Items Addressed This Visit      Other   Acute left-sided back pain with sciatica - Primary    Recurrent  Xray today of LS  Ref to PT eval and tx  Reassuring exam-no neuro changes  Pred taper px/disc side eff Use flexeril prn with caution of sedation  Gentle stretching  Heat  Update if not starting to improve in a week or if worsening        Relevant Medications   predniSONE (DELTASONE) 20 MG tablet   Other Relevant Orders   Ambulatory referral to Physical Therapy

## 2018-06-09 NOTE — Assessment & Plan Note (Signed)
Recurrent  Xray today of LS  Ref to PT eval and tx  Reassuring exam-no neuro changes  Pred taper px/disc side eff Use flexeril prn with caution of sedation  Gentle stretching  Heat  Update if not starting to improve in a week or if worsening

## 2018-06-14 ENCOUNTER — Ambulatory Visit: Payer: Self-pay | Admitting: *Deleted

## 2018-06-14 DIAGNOSIS — R42 Dizziness and giddiness: Secondary | ICD-10-CM

## 2018-06-14 NOTE — Addendum Note (Signed)
Addended by: Eliezer Lofts E on: 06/14/2018 05:13 PM   Modules accepted: Orders

## 2018-06-14 NOTE — Telephone Encounter (Signed)
Patient is calling to request a referral- Vertigo. Patient was seen in the ED(7/8) and then followed up at the office by Dr Diona Browner (7/11). PCP instructed her to call back if she did not get any better.  Best contact number: [phone]  Reason for Disposition . [1] MODERATE dizziness (e.g., vertigo; feels very unsteady, interferes with normal activities) AND [2] has been evaluated by physician for this  Answer Assessment - Initial Assessment Questions 1. DESCRIPTION: "Describe your dizziness."     Spinning- feels like falling over 2. VERTIGO: "Do you feel like either you or the room is spinning or tilting?"      Diagnosed and had follow up 3. LIGHTHEADED: "Do you feel lightheaded?" (e.g., somewhat faint, woozy, weak upon standing)     Head movement makes her dizzy 4. SEVERITY: "How bad is it?"  "Can you walk?"   - MILD - Feels unsteady but walking normally.   - MODERATE - Feels very unsteady when walking, but not falling; interferes with normal activities (e.g., school, work) .   - SEVERE - Unable to walk without falling (requires assistance).     moderate 5. ONSET:  "When did the dizziness begin?"     3 weeks- off balance 6. AGGRAVATING FACTORS: "Does anything make it worse?" (e.g., standing, change in head position)     Head movement 7. CAUSE: "What do you think is causing the dizziness?"     vetigo 8. RECURRENT SYMPTOM: "Have you had dizziness before?" If so, ask: "When was the last time?" "What happened that time?"     Inner ear problems before- but not like this- patient took meclizine 9. OTHER SYMPTOMS: "Do you have any other symptoms?" (e.g., headache, weakness, numbness, vomiting, earache)     Ears hurt- pressure or feels sinus pain 10. PREGNANCY: "Is there any chance you are pregnant?" "When was your last menstrual period?"       n/a  Protocols used: DIZZINESS - VERTIGO-A-AH

## 2018-06-14 NOTE — Telephone Encounter (Signed)
Referral  To ENT sent

## 2018-06-18 ENCOUNTER — Encounter (INDEPENDENT_AMBULATORY_CARE_PROVIDER_SITE_OTHER): Payer: Self-pay

## 2018-07-31 ENCOUNTER — Other Ambulatory Visit: Payer: Self-pay | Admitting: Family Medicine

## 2018-09-12 ENCOUNTER — Other Ambulatory Visit: Payer: Self-pay | Admitting: Family Medicine

## 2018-10-01 DIAGNOSIS — M0589 Other rheumatoid arthritis with rheumatoid factor of multiple sites: Secondary | ICD-10-CM | POA: Diagnosis not present

## 2018-10-01 DIAGNOSIS — M542 Cervicalgia: Secondary | ICD-10-CM | POA: Diagnosis not present

## 2018-10-01 DIAGNOSIS — M5136 Other intervertebral disc degeneration, lumbar region: Secondary | ICD-10-CM | POA: Diagnosis not present

## 2018-12-21 ENCOUNTER — Telehealth (INDEPENDENT_AMBULATORY_CARE_PROVIDER_SITE_OTHER): Payer: PRIVATE HEALTH INSURANCE | Admitting: Family Medicine

## 2018-12-21 ENCOUNTER — Other Ambulatory Visit (INDEPENDENT_AMBULATORY_CARE_PROVIDER_SITE_OTHER): Payer: PRIVATE HEALTH INSURANCE

## 2018-12-21 DIAGNOSIS — D696 Thrombocytopenia, unspecified: Secondary | ICD-10-CM | POA: Diagnosis not present

## 2018-12-21 DIAGNOSIS — I10 Essential (primary) hypertension: Secondary | ICD-10-CM | POA: Diagnosis not present

## 2018-12-21 DIAGNOSIS — R7303 Prediabetes: Secondary | ICD-10-CM

## 2018-12-21 LAB — CBC WITH DIFFERENTIAL/PLATELET
BASOS PCT: 0.4 % (ref 0.0–3.0)
Basophils Absolute: 0 10*3/uL (ref 0.0–0.1)
EOS PCT: 1.8 % (ref 0.0–5.0)
Eosinophils Absolute: 0.1 10*3/uL (ref 0.0–0.7)
HEMATOCRIT: 37.8 % (ref 36.0–46.0)
HEMOGLOBIN: 12.3 g/dL (ref 12.0–15.0)
Lymphocytes Relative: 37.1 % (ref 12.0–46.0)
Lymphs Abs: 2.5 10*3/uL (ref 0.7–4.0)
MCHC: 32.6 g/dL (ref 30.0–36.0)
MCV: 86.2 fl (ref 78.0–100.0)
MONO ABS: 0.7 10*3/uL (ref 0.1–1.0)
Monocytes Relative: 10.5 % (ref 3.0–12.0)
Neutro Abs: 3.4 10*3/uL (ref 1.4–7.7)
Neutrophils Relative %: 50.2 % (ref 43.0–77.0)
PLATELETS: 81 10*3/uL — AB (ref 150.0–400.0)
RBC: 4.39 Mil/uL (ref 3.87–5.11)
RDW: 15.4 % (ref 11.5–15.5)
WBC: 6.8 10*3/uL (ref 4.0–10.5)

## 2018-12-21 LAB — LIPID PANEL
CHOL/HDL RATIO: 3
Cholesterol: 173 mg/dL (ref 0–200)
HDL: 68.4 mg/dL (ref >39.00)
LDL Cholesterol: 90 mg/dL (ref 0–99)
NonHDL: 104.23
TRIGLYCERIDES: 73 mg/dL (ref 0.0–149.0)
VLDL: 14.6 mg/dL (ref 0.0–40.0)

## 2018-12-21 LAB — COMPREHENSIVE METABOLIC PANEL
ALBUMIN: 4.6 g/dL (ref 3.5–5.2)
ALT: 15 U/L (ref 0–35)
AST: 13 U/L (ref 0–37)
Alkaline Phosphatase: 62 U/L (ref 39–117)
BILIRUBIN TOTAL: 0.8 mg/dL (ref 0.2–1.2)
BUN: 19 mg/dL (ref 6–23)
CALCIUM: 9.6 mg/dL (ref 8.4–10.5)
CO2: 32 meq/L (ref 19–32)
Chloride: 100 mEq/L (ref 96–112)
Creatinine, Ser: 0.77 mg/dL (ref 0.40–1.20)
GFR: 92.79 mL/min (ref >60.00)
Glucose, Bld: 110 mg/dL — ABNORMAL HIGH (ref 70–99)
Potassium: 3.7 mEq/L (ref 3.5–5.1)
SODIUM: 139 meq/L (ref 135–145)
Total Protein: 7.3 g/dL (ref 6.0–8.3)

## 2018-12-21 LAB — HEMOGLOBIN A1C: HEMOGLOBIN A1C: 6 % (ref 4.6–6.5)

## 2018-12-21 NOTE — Telephone Encounter (Signed)
-----   Message from Ellamae Sia sent at 12/10/2018  9:06 AM EST ----- Regarding: Lab orders for Friday, 1.31.20 Patient is scheduled for CPX labs, please order future labs, Thanks , Karna Christmas

## 2018-12-25 ENCOUNTER — Telehealth: Payer: Self-pay

## 2018-12-25 NOTE — Telephone Encounter (Signed)
Spoke with patient and scheduled appointment with [facility] on 3/18 @ 10:30. Per patient agreed on this date and time and has MyChart

## 2018-12-28 ENCOUNTER — Telehealth: Payer: Self-pay

## 2018-12-28 NOTE — Telephone Encounter (Signed)
Per patient request to change her scheduled appointment date due to her employment. Per 2/7 follow phone call. [place]

## 2019-01-04 ENCOUNTER — Encounter: Payer: Self-pay | Admitting: Family Medicine

## 2019-01-04 ENCOUNTER — Other Ambulatory Visit: Payer: Self-pay | Admitting: Family Medicine

## 2019-01-04 ENCOUNTER — Ambulatory Visit (INDEPENDENT_AMBULATORY_CARE_PROVIDER_SITE_OTHER): Payer: PRIVATE HEALTH INSURANCE | Admitting: Family Medicine

## 2019-01-04 ENCOUNTER — Telehealth: Payer: Self-pay | Admitting: Family Medicine

## 2019-01-04 VITALS — BP 120/80 | HR 66 | Temp 97.8°F | Ht 64.0 in | Wt 201.8 lb

## 2019-01-04 DIAGNOSIS — Z0000 Encounter for general adult medical examination without abnormal findings: Secondary | ICD-10-CM

## 2019-01-04 DIAGNOSIS — R7303 Prediabetes: Secondary | ICD-10-CM

## 2019-01-04 DIAGNOSIS — Z1231 Encounter for screening mammogram for malignant neoplasm of breast: Secondary | ICD-10-CM

## 2019-01-04 DIAGNOSIS — I10 Essential (primary) hypertension: Secondary | ICD-10-CM | POA: Diagnosis not present

## 2019-01-04 DIAGNOSIS — M069 Rheumatoid arthritis, unspecified: Secondary | ICD-10-CM

## 2019-01-04 DIAGNOSIS — D696 Thrombocytopenia, unspecified: Secondary | ICD-10-CM | POA: Diagnosis not present

## 2019-01-04 DIAGNOSIS — M25561 Pain in right knee: Secondary | ICD-10-CM

## 2019-01-04 MED ORDER — IBUPROFEN 800 MG PO TABS: 800.0000 mg | ORAL_TABLET | Freq: Three times a day (TID) | ORAL | 0 refills | Status: DC | PRN

## 2019-01-04 NOTE — Telephone Encounter (Signed)
Where does she want to go. I will put in order once this is known.

## 2019-01-04 NOTE — Addendum Note (Signed)
Addended by: Eliezer Lofts E on: 01/04/2019 02:43 PM   Modules accepted: Orders

## 2019-01-04 NOTE — Patient Instructions (Addendum)
Get back to regular exercise as able.Rachel Rivas 3-5 times a week.  Work on low carb and low cholesterol diet. Call to set up mammogram on your own.  Given limited calcium and vit D in diet.Rachel Rivas start  Ca/VIT D3 600 mg/400 IU 1-2 times daily.  n

## 2019-01-04 NOTE — Progress Notes (Signed)
Subjective:    Patient ID: Rachel Rivas, female    DOB: 11-Jun-1961, 58 y.o.   MRN: [id number]  HPI The patient is here for annual wellness exam and preventative care.    Hypertension:   Well controlled  On HCTZ, losartan  BP Readings from Last 3 Encounters:  01/04/19 120/80  06/08/18 118/60  05/31/18 124/84  Using medication without problems or lightheadedness:  none Chest pain with exertion:none Edema:none Short of breath: mild with exertion from weight gain.Marland Kitchen occ Average home BPs: Other issues:  Prediabetes: Lab Results  Component Value Date   HGBA1C 6.0 12/21/2018     Cholesterol  Lab Results  Component Value Date   CHOL 173 12/21/2018   HDL 68.40 12/21/2018   LDLCALC 90 12/21/2018   TRIG 73.0 12/21/2018   CHOLHDL 3 12/21/2018   At goal on no med.  Moderate diet  Exercise: has not been able to exercise as much. Thrombocytopenia.. stable from 6 months ago.   RA: followed by Rheumatology: Dr. Marijean Bravo. Stable no flares on methotrexate.  Currently with flare in right elbow.. now on prednisone taper Social History /Family History/Past Medical History reviewed in detail and updated in EMR if needed. Blood pressure 120/80, pulse 66, temperature 97.8 F (36.6 C), temperature source Oral, height 5\' 4"  (1.626 m), weight 201 lb 12 oz (91.5 kg), last menstrual period 11/17/2012. Wt Readings from Last 3 Encounters:  01/04/19 201 lb 12 oz (91.5 kg)  06/08/18 197 lb (89.4 kg)  05/31/18 198 lb (89.8 kg)     Review of Systems  Constitutional: Negative for fatigue and fever.  HENT: Negative for congestion.   Eyes: Negative for pain.  Respiratory: Negative for cough and shortness of breath.   Cardiovascular: Negative for chest pain, palpitations and leg swelling.  Gastrointestinal: Negative for abdominal pain.  Genitourinary: Negative for dysuria and vaginal bleeding.  Musculoskeletal: Negative for back pain.  Neurological: Negative for syncope, light-headedness and  headaches.  Psychiatric/Behavioral: Negative for dysphoric mood.       Objective:   Physical Exam Exam conducted with a chaperone present.  Constitutional:      General: She is not in acute distress.    Appearance: Normal appearance. She is well-developed. She is obese. She is not ill-appearing or toxic-appearing.  HENT:     Head: Normocephalic.     Right Ear: Hearing, tympanic membrane, ear canal and external ear normal.     Left Ear: Hearing, tympanic membrane, ear canal and external ear normal.     Nose: Nose normal.  Eyes:     General: Lids are normal. Lids are everted, no foreign bodies appreciated.     Conjunctiva/sclera: Conjunctivae normal.     Pupils: Pupils are equal, round, and reactive to light.  Neck:     Musculoskeletal: Normal range of motion and neck supple.     Thyroid: No thyroid mass or thyromegaly.     Vascular: No carotid bruit.     Trachea: Trachea normal.  Cardiovascular:     Rate and Rhythm: Normal rate and regular rhythm.     Heart sounds: Normal heart sounds, S1 normal and S2 normal. No murmur. No gallop.   Pulmonary:     Effort: Pulmonary effort is normal. No respiratory distress.     Breath sounds: Normal breath sounds. No wheezing, rhonchi or rales.  Abdominal:     General: Bowel sounds are normal. There is no distension or abdominal bruit.     Palpations: Abdomen is soft. There is  no fluid wave or mass.     Tenderness: There is no abdominal tenderness. There is no guarding or rebound.     Hernia: No hernia is present. There is no hernia in the right inguinal area or left inguinal area.  Genitourinary:    Exam position: Supine.     Labia:        Right: No rash or lesion.        Left: No rash or lesion.      Vagina: Normal.     Uterus: Normal.      Adnexa: Right adnexa normal and left adnexa normal.  Lymphadenopathy:     Cervical: No cervical adenopathy.  Skin:    General: Skin is warm and dry.     Findings: No rash.  Neurological:      Mental Status: She is alert.     Cranial Nerves: No cranial nerve deficit.     Sensory: No sensory deficit.  Psychiatric:        Mood and Affect: Mood is not anxious or depressed.        Speech: Speech normal.        Behavior: Behavior normal. Behavior is cooperative.        Judgment: Judgment normal.           Assessment & Plan:  The patient's preventative maintenance and recommended screening tests for an annual wellness exam were reviewed in full today. Brought up to date unless services declined.  Counselled on the importance of diet, exercise, and its role in overall health and mortality. The patient's FH and SH was reviewed, including their home life, tobacco status, and drug and alcohol status.   Mammo: 12/2017 Colon: 02/2016 adenoma, Dr. Havery Moros, repeat in 5 years. Former smoker minimal use, 3 pack years, remote Vaccines: flu refused and tdap 2010 2018 nml pap, no HPVSpace to every 5 years, yearly DVE. Mother died with uterine cancer.  Hep C neg HIV: refused Bone density: prednisone use in past.. no family history .Marland Kitchen Start bone density screening age 51.

## 2019-01-04 NOTE — Telephone Encounter (Signed)
Referral made 

## 2019-01-04 NOTE — Assessment & Plan Note (Signed)
Well controlled. Continue current medication. Encouraged exercise, weight loss, healthy eating habits.  

## 2019-01-04 NOTE — Addendum Note (Signed)
Addended by: Eliezer Lofts E on: 01/04/2019 09:21 AM   Modules accepted: Orders

## 2019-01-04 NOTE — Assessment & Plan Note (Addendum)
Stable. Followed by Dr. Alen Blew [facility]

## 2019-01-04 NOTE — Assessment & Plan Note (Signed)
Followed by Rheum.. stable overall except on prednisone for flare currently.

## 2019-01-04 NOTE — Telephone Encounter (Signed)
Pt called requesting Dr.Bedsole to put in a order for her to get a mammogram. The pt's insurance changed and they no longer cover the location she was previously going to.

## 2019-01-04 NOTE — Telephone Encounter (Signed)
Spoke with Rachel Rivas.  She states the called the Breast Center in [place] but was told her insurance was not in network.  She called her insurance and they gave her [facility] but they don't do mammograms so she got frustrated and is hoping we can put in a referral and have our referral coordinators find out where she can go.  She would prefer to go somewhere in [place] but she is willing to go to [place] if she needs to.

## 2019-01-14 NOTE — Telephone Encounter (Signed)
Left message asking pt to call office.   Transfer to Madelaine Etienne with [facility] @ [employer] customer service.  She spent 35 min trying to find an in network breast center she gave me [facility]  Pt had already called them they do not do mammograms.   She spoke with her supervisor they transferred me to [place] network  [phone].  I spoke with Levada Dy. She stated she would look into this and call the patient and also call me to let me know what she found out

## 2019-01-15 NOTE — Telephone Encounter (Signed)
Spoke with pt to let her know Rachel Rivas @ [facility] physician care will be calling her to let her know who is in her network. Pt stated she went ahead and made appointment @ [facility] for her mammogram.  She will call me when Komal calls her

## 2019-01-21 NOTE — Telephone Encounter (Signed)
Rachel Rivas spoke with [name] stated pt can go to breast center of [place]  They are in network  [name] stated she left message for pt

## 2019-02-05 ENCOUNTER — Other Ambulatory Visit: Payer: Self-pay | Admitting: Family Medicine

## 2019-02-05 ENCOUNTER — Ambulatory Visit: Payer: PRIVATE HEALTH INSURANCE | Admitting: Family Medicine

## 2019-02-05 ENCOUNTER — Encounter: Payer: Self-pay | Admitting: Family Medicine

## 2019-02-05 ENCOUNTER — Other Ambulatory Visit: Payer: Self-pay

## 2019-02-05 ENCOUNTER — Ambulatory Visit (INDEPENDENT_AMBULATORY_CARE_PROVIDER_SITE_OTHER)
Admission: RE | Admit: 2019-02-05 | Discharge: 2019-02-05 | Disposition: A | Discharge status: Home / Self Care | Payer: PRIVATE HEALTH INSURANCE | Source: Ambulatory Visit | Attending: Family Medicine | Admitting: Family Medicine

## 2019-02-05 VITALS — BP 120/80 | HR 59 | Temp 98.7°F | Ht 64.0 in | Wt 198.2 lb

## 2019-02-05 DIAGNOSIS — R509 Fever, unspecified: Secondary | ICD-10-CM

## 2019-02-05 DIAGNOSIS — J111 Influenza due to unidentified influenza virus with other respiratory manifestations: Secondary | ICD-10-CM | POA: Diagnosis not present

## 2019-02-05 MED ORDER — ALBUTEROL SULFATE HFA 108 (90 BASE) MCG/ACT IN AERS
2.0000 | INHALATION_SPRAY | Freq: Four times a day (QID) | RESPIRATORY_TRACT | 0 refills | Status: DC | PRN
Start: 2019-02-05 — End: 2019-03-21

## 2019-02-05 MED ORDER — GUAIFENESIN-CODEINE 100-10 MG/5ML PO SYRP: 5.0000 mL | ORAL_SOLUTION | Freq: Every evening | ORAL | 0 refills | Status: DC | PRN

## 2019-02-05 MED ORDER — BENZONATATE 200 MG PO CAPS: 200.0000 mg | ORAL_CAPSULE | Freq: Two times a day (BID) | ORAL | 0 refills | Status: DC | PRN

## 2019-02-05 MED ORDER — PREDNISONE 20 MG PO TABS: ORAL_TABLET | ORAL | 0 refills | Status: DC

## 2019-02-05 NOTE — Patient Instructions (Signed)
We will send you a my chart message regarding the final report of your chest X-ray.  Start prednisone taper. Can use albuterol as needed for wheeze or shortness of breath.,  Pick up cough suppressants sent in.  Return to work 24 hours after fever resolved.

## 2019-02-05 NOTE — Progress Notes (Signed)
Subjective:    Patient ID: Rachel Rivas, female    DOB: September 04, 1961, 58 y.o.   MRN: [id number]  HPI    58 year old female Dx with influenza  At urgent care After fever 103F on 3/10 Given  tamiflu 3/11.. but did not take it. Continued with fever off and on  98-100.5.  Continued symptomatic care.  On 3/16 still had temp 100.1 F and continued cough.   Today she reports  No further fever in last 24 hours  She still has dry cough. No face pain, no ear pain. No myalgias stills. She feels short of breath. Occ wheeze.   No history of asthma, nonsmoker.    Social History /Family History/Past Medical History reviewed in detail and updated in EMR if needed. Blood pressure 120/80, pulse (!) 59, temperature 98.7 F (37.1 C), temperature source Oral, height 5\' 4"  (1.626 m), weight 198 lb 4 oz (89.9 kg), last menstrual period 11/17/2012, SpO2 94 %.  Review of Systems  Constitutional: Negative for fatigue and fever.  HENT: Positive for congestion.   Eyes: Negative for pain.  Respiratory: Positive for cough and shortness of breath.   Cardiovascular: Negative for chest pain, palpitations and leg swelling.  Gastrointestinal: Negative for abdominal pain.  Genitourinary: Negative for dysuria and vaginal bleeding.  Musculoskeletal: Negative for back pain.  Neurological: Negative for syncope, light-headedness and headaches.  Psychiatric/Behavioral: Negative for dysphoric mood.       Objective:   Physical Exam Constitutional:      General: She is not in acute distress.    Appearance: She is well-developed. She is not ill-appearing or toxic-appearing.  HENT:     Head: Normocephalic.     Right Ear: Hearing, tympanic membrane, ear canal and external ear normal. Tympanic membrane is not erythematous, retracted or bulging.     Left Ear: Hearing, tympanic membrane, ear canal and external ear normal. Tympanic membrane is not erythematous, retracted or bulging.     Nose: Mucosal edema and  rhinorrhea present.     Right Sinus: No maxillary sinus tenderness or frontal sinus tenderness.     Left Sinus: No maxillary sinus tenderness or frontal sinus tenderness.     Mouth/Throat:     Pharynx: Uvula midline.  Eyes:     General: Lids are normal. Lids are everted, no foreign bodies appreciated.     Conjunctiva/sclera: Conjunctivae normal.     Pupils: Pupils are equal, round, and reactive to light.  Neck:     Musculoskeletal: Normal range of motion and neck supple.     Thyroid: No thyroid mass or thyromegaly.     Vascular: No carotid bruit.     Trachea: Trachea normal.  Cardiovascular:     Rate and Rhythm: Normal rate and regular rhythm.     Pulses: Normal pulses.     Heart sounds: Normal heart sounds, S1 normal and S2 normal. No murmur. No friction rub. No gallop.   Pulmonary:     Effort: Pulmonary effort is normal. No tachypnea or respiratory distress.     Breath sounds: Normal breath sounds. No decreased breath sounds, wheezing, rhonchi or rales.  Skin:    General: Skin is warm and dry.     Findings: No rash.  Neurological:     Mental Status: She is alert.  Psychiatric:        Mood and Affect: Mood is not anxious or depressed.        Speech: Speech normal.  Behavior: Behavior normal. Behavior is cooperative.        Judgment: Judgment normal.           Assessment & Plan:

## 2019-02-06 ENCOUNTER — Ambulatory Visit: Payer: PRIVATE HEALTH INSURANCE | Admitting: Oncology

## 2019-02-12 ENCOUNTER — Telehealth: Payer: Self-pay | Admitting: Oncology

## 2019-02-12 NOTE — Telephone Encounter (Signed)
R/s 3/31 appt per 3/23 sch message message for patient with appt date and time

## 2019-02-13 DIAGNOSIS — R05 Cough: Secondary | ICD-10-CM | POA: Insufficient documentation

## 2019-02-13 DIAGNOSIS — R053 Chronic cough: Secondary | ICD-10-CM | POA: Insufficient documentation

## 2019-02-13 DIAGNOSIS — R509 Fever, unspecified: Secondary | ICD-10-CM | POA: Insufficient documentation

## 2019-02-13 NOTE — Assessment & Plan Note (Addendum)
To late to use tamiflu. Will treat with pred taper given dry hacky cough and apparent reactive airway reaction to flu.

## 2019-02-13 NOTE — Assessment & Plan Note (Signed)
Given persistent fever in setting of influenza.. will eval with CXR to rule out CA PNA. Continue symptomatic care.   Low risk for COVID exposure.

## 2019-02-19 ENCOUNTER — Ambulatory Visit: Payer: PRIVATE HEALTH INSURANCE | Admitting: Oncology

## 2019-02-19 ENCOUNTER — Ambulatory Visit (INDEPENDENT_AMBULATORY_CARE_PROVIDER_SITE_OTHER): Payer: PRIVATE HEALTH INSURANCE | Admitting: Family Medicine

## 2019-02-19 ENCOUNTER — Encounter: Payer: Self-pay | Admitting: Family Medicine

## 2019-02-19 ENCOUNTER — Other Ambulatory Visit: Payer: Self-pay

## 2019-02-19 DIAGNOSIS — R9389 Abnormal findings on diagnostic imaging of other specified body structures: Secondary | ICD-10-CM | POA: Diagnosis not present

## 2019-02-19 DIAGNOSIS — R05 Cough: Secondary | ICD-10-CM | POA: Diagnosis not present

## 2019-02-19 DIAGNOSIS — R053 Chronic cough: Secondary | ICD-10-CM

## 2019-02-19 NOTE — Telephone Encounter (Signed)
Call pt to see if we can do a webex.

## 2019-02-19 NOTE — Telephone Encounter (Signed)
Appointment made for 3:30 pm today

## 2019-02-19 NOTE — Progress Notes (Signed)
VIRTUAL VISIT  Interactive audio and video telecommunications were attempted between this provider and patient via [facility], however failed, due to patient having technical difficulties   We continued and completed audio and visual visit with via [place].   I connected with Rachel Rivas on 02/21/2019 by [facility] and verified that I am speaking with the correct person using two identifiers.   I discussed the limitations, risks, security and privacy concerns of performing an evaluation and management service by [facility] and the availability of in person appointments. I also discussed with the patient that there may be a patient responsible charge related to this service. The patient expressed understanding and agreed to proceed.  Patient location: Home Provider Location: [facility] Participants: Rachel Rivas and Rachel Rivas   Chief Complaint  Patient presents with  . Cough    History of Present Illness:  58 year old female pt with persistent cough and shortness of breath following flu infection on 3/10.   CXR 3/17 IMPRESSION: Subtle linear nodular opacity over the left base which may be acute or chronic. Recommend follow-up chest radiograph 3-4 weeks.   She is feeling better overall but still has a cough now for 4 weeks. No fever recently.  Cough frequently productive of clear sputum.  No blood. Coughing fits off and on, cough triggered with pollen and some with talking.  She has mild shortness of breath with coughing or moving around.   No chest pain.   Nonsmoker.   She feels that prednisone helped a little with cough. Albuterol helps a small amount.  no history of COPD or asthma  COVID 19 screen No recent travel or known exposure to [place]  The importance of social distancing was discussed today.   Review of Systems  Constitutional: Negative for fever.  HENT: Negative for ear pain.   Respiratory: Positive for cough and shortness of breath.        Chest  tightness at times  Cardiovascular: Negative for chest pain and palpitations.  Gastrointestinal: Negative for constipation, diarrhea, heartburn and nausea.  Genitourinary: Negative for dysuria.  Musculoskeletal: Negative for myalgias.  Skin: Negative for rash.      Past Medical History:  Diagnosis Date  . Adenomatous colon polyp   . Allergy    occasionally  . Anemia    yrs ago, low platelets  . Arthritis    RA  . Blood transfusion without reported diagnosis    1985 with c section  . Borderline diabetic   . GERD (gastroesophageal reflux disease)   . Hypertension   . Thrombocytopenia ([place])     reports that she has quit smoking. Her smoking use included cigarettes. She has a 3.00 pack-year smoking history. She has never used smokeless tobacco. She reports current alcohol use. She reports that she does not use drugs.   Current Outpatient Medications:  .  albuterol (PROVENTIL HFA;VENTOLIN HFA) 108 (90 Base) MCG/ACT inhaler, Inhale 2 puffs into the lungs every 6 (six) hours as needed for wheezing or shortness of breath., Disp: 1 Inhaler, Rfl: 0 .  benzonatate (TESSALON) 200 MG capsule, Take 1 capsule (200 mg total) by mouth 2 (two) times daily as needed for cough., Disp: 20 capsule, Rfl: 0 .  cyclobenzaprine (FLEXERIL) 10 MG tablet, Take 1 tablet (10 mg total) by mouth at bedtime as needed for muscle spasms., Disp: 15 tablet, Rfl: 0 .  folic acid (FOLVITE) 1 MG tablet, Take 2 mg by mouth daily. , Disp: , Rfl: 3 .  guaiFENesin-codeine (  ROBITUSSIN AC) 100-10 MG/5ML syrup, Take 5-10 mLs by mouth at bedtime as needed for cough., Disp: 180 mL, Rfl: 0 .  hydrochlorothiazide (MICROZIDE) 12.5 MG capsule, TAKE 1 CAPSULE BY MOUTH ONCE DAILY, Disp: 90 capsule, Rfl: 1 .  ibuprofen (ADVIL,MOTRIN) 800 MG tablet, Take 1 tablet (800 mg total) by mouth every 8 (eight) hours as needed for moderate pain., Disp: 30 tablet, Rfl: 0 .  losartan (COZAAR) 50 MG tablet, Take 1 tablet by mouth once daily, Disp: 90  tablet, Rfl: 1 .  meclizine (ANTIVERT) 25 MG tablet, Take 1 tablet (25 mg total) by mouth 3 (three) times daily as needed for dizziness., Disp: 30 tablet, Rfl: 0 .  methotrexate (RHEUMATREX) 2.5 MG tablet, Take 8 tablets by mouth once a week., Disp: , Rfl:  .  predniSONE (DELTASONE) 20 MG tablet, 3 tabs by mouth daily x 3 days, then 2 tabs by mouth daily x 2 days then 1 tab by mouth daily x 2 days, Disp: 15 tablet, Rfl: 0  Current Facility-Administered Medications:  .  0.9 %  sodium chloride infusion, 500 mL, Intravenous, Continuous, Armbruster, Carlota Raspberry, MD   Observations/Objective: Last menstrual period 11/17/2012.  Physical Exam  Physical Exam Constitutional:      General: She is not in acute distress. Pulmonary:     Effort: Pulmonary effort is normal. No respiratory distress.  She does cough repeatedly during appt, dry. Neurological:     Mental Status: She is alert and oriented to person, place, and time.  Psychiatric:        Mood and Affect: Mood normal.        Behavior: Behavior normal.    Assessment and Plan  Persistent cough for 3 weeks or longer Felt initially cough reactive airways and post infectious from resolving Influenza. Given slightly atypical CXR on 3/17 and pt with SOB and persistent cough./  Will treat with antibiotics, antihistamine to cover for allergy and have her return for OV. At that time we will perform follow up CXR.  Abnormal CXR Low risk for lung cancer... nonsmoker .Marland Kitchen re-eval for resolution after antibiotics.    I discussed the assessment and treatment plan with the patient. The patient was provided an opportunity to ask questions and all were answered. The patient agreed with the plan and demonstrated an understanding of the instructions.   The patient was advised to call back or seek an in-person evaluation if the symptoms worsen or if the condition fails to improve as anticipated.     Rachel Lofts, MD

## 2019-02-21 DIAGNOSIS — R9389 Abnormal findings on diagnostic imaging of other specified body structures: Secondary | ICD-10-CM | POA: Insufficient documentation

## 2019-02-21 MED ORDER — DOXYCYCLINE HYCLATE 100 MG PO TABS: 100.0000 mg | ORAL_TABLET | Freq: Two times a day (BID) | ORAL | 0 refills | Status: DC

## 2019-02-21 NOTE — Assessment & Plan Note (Signed)
Low risk for lung cancer... nonsmoker .Marland Kitchen re-eval for resolution after antibiotics.

## 2019-02-21 NOTE — Assessment & Plan Note (Signed)
Felt initially cough reactive airways and post infectious from resolving Influenza. Given slightly atypical CXR on 3/17 and pt with SOB and persistent cough./  Will treat with antibiotics, antihistamine to cover for allergy and have her return for OV. At that time we will perform follow up CXR.

## 2019-02-22 ENCOUNTER — Telehealth: Payer: Self-pay | Admitting: Family Medicine

## 2019-02-22 ENCOUNTER — Ambulatory Visit: Payer: PRIVATE HEALTH INSURANCE | Admitting: Family Medicine

## 2019-02-22 NOTE — Telephone Encounter (Signed)
Left message asking pt to call office see below note   Please schedule pt for in office OV For eval by PCP( me) and to perform follow up CXR in 4 weeks for re-eval of nodularity, make note in appt slot that pt needs to be called prior to appt to make sure respiratory symtpoms resolved

## 2019-02-25 NOTE — Telephone Encounter (Signed)
Left message askign pt to call office

## 2019-02-27 ENCOUNTER — Telehealth: Payer: Self-pay | Admitting: Family Medicine

## 2019-02-27 NOTE — Telephone Encounter (Signed)
[employer] faxed disability benefit form In dr Diona Browner in box

## 2019-02-27 NOTE — Telephone Encounter (Signed)
Mal Amabile: I will not be back in office until next week, Tuesday... can this be completed by Shirlean Mylar and signed by Dr. Lorelei Pont? Or can it wait.or can it be scanned in for me to print,sign and then I will scan it back in? I have never done that before but can try.

## 2019-02-27 NOTE — Telephone Encounter (Signed)
Appt made 4/30, notes made for screening

## 2019-02-28 NOTE — Telephone Encounter (Signed)
I feel very uncomfortable signing or being involved in a disability claim with a patient I have never evaluated.  I do not recall ever doing so over 15 years.  Short term disability claim can wait and patient will receive her benefits if paperwork completed next week.  I will leave for Dr. Jacinto Reap.

## 2019-02-28 NOTE — Telephone Encounter (Signed)
Rachel Rivas has completed paperwork and placed in Dr. Lillie Fragmin in box to sign for Dr. Diona Browner.

## 2019-02-28 NOTE — Telephone Encounter (Signed)
Okay.  I understand if Dr. Lorelei Pont is not comfortable doing this. Let me know if Pt needs ASAP and we can try the other method (scan it in I print, sign and rescan) or I can come in to the office to sign.

## 2019-03-05 DIAGNOSIS — Z0279 Encounter for issue of other medical certificate: Secondary | ICD-10-CM

## 2019-03-06 NOTE — Telephone Encounter (Signed)
Paperwork faxed Copy for pt Copy for scan Copy for billing

## 2019-03-12 ENCOUNTER — Other Ambulatory Visit: Payer: Self-pay | Admitting: Family Medicine

## 2019-03-21 ENCOUNTER — Encounter: Payer: Self-pay | Admitting: Family Medicine

## 2019-03-21 ENCOUNTER — Ambulatory Visit: Payer: PRIVATE HEALTH INSURANCE | Admitting: Family Medicine

## 2019-03-21 ENCOUNTER — Ambulatory Visit (INDEPENDENT_AMBULATORY_CARE_PROVIDER_SITE_OTHER)
Admission: RE | Admit: 2019-03-21 | Discharge: 2019-03-21 | Disposition: A | Discharge status: Home / Self Care | Payer: PRIVATE HEALTH INSURANCE | Source: Ambulatory Visit | Attending: Family Medicine | Admitting: Family Medicine

## 2019-03-21 ENCOUNTER — Other Ambulatory Visit: Payer: Self-pay

## 2019-03-21 VITALS — BP 128/84 | HR 71 | Temp 98.5°F | Ht 64.0 in | Wt 199.5 lb

## 2019-03-21 DIAGNOSIS — M7711 Lateral epicondylitis, right elbow: Secondary | ICD-10-CM

## 2019-03-21 DIAGNOSIS — R9389 Abnormal findings on diagnostic imaging of other specified body structures: Secondary | ICD-10-CM

## 2019-03-21 DIAGNOSIS — R053 Chronic cough: Secondary | ICD-10-CM

## 2019-03-21 DIAGNOSIS — R05 Cough: Secondary | ICD-10-CM

## 2019-03-21 NOTE — Progress Notes (Signed)
Subjective:    Patient ID: Rachel Rivas, female    DOB: June 17, 1961, 58 y.o.   MRN: [id number]  HPI   58 year old nonsmoking female pt presents for follow up persistent cough and for repeat CXR given abnormal CXR.  At last OV 02/19/2019 Felt initially cough reactive airways and post infectious from resolving Influenza. Given slightly atypical CXR on 3/17 and pt with SOB and persistent cough. Will treat with antibiotics ( doxycycline), antihistamine to cover for allergy and have her return for OV. At that time we will perform follow up CXR.   She reports today that her cough has resolved completely.  No fever, no flu-like symptoms. No wheeze, no SOB. Nml po intake.  CXR 3/17 IMPRESSION: Subtle linear nodular opacity over the left base which may be acute or chronic. Recommend follow-up chest radiograph 3-4 weeks.  IN ADDITION: She reports right lateral elbow pain in last 2 months since she has been working remotely and more constantly on the computer. She repetitively uses mouse with right hand.  Poor ergodynamic set up at home computer.   No redness, no swelling .  Pain is greatest with straightening elbow and supination.  Has not treated with anything.  No fall, no numbness, no weakness  no neck pain.   Social History /Family History/Past Medical History reviewed in detail and updated in EMR if needed. Blood pressure 128/84, pulse 71, temperature 98.5 F (36.9 C), temperature source Oral, height 5\' 4"  (1.626 m), weight 199 lb 8 oz (90.5 kg), last menstrual period 11/17/2012, SpO2 96 %.   Review of Systems  Constitutional: Negative for fatigue and fever.  HENT: Negative for congestion.   Eyes: Negative for pain.  Respiratory: Negative for cough and shortness of breath.   Cardiovascular: Negative for chest pain, palpitations and leg swelling.  Gastrointestinal: Negative for abdominal pain.  Genitourinary: Negative for dysuria and vaginal bleeding.  Musculoskeletal:  Negative for back pain and joint swelling.  Neurological: Negative for syncope, light-headedness and headaches.  Psychiatric/Behavioral: Negative for dysphoric mood.       Objective:   Physical Exam Constitutional:      General: She is not in acute distress.    Appearance: Normal appearance. She is well-developed. She is not ill-appearing or toxic-appearing.  HENT:     Head: Normocephalic.     Right Ear: Hearing, tympanic membrane, ear canal and external ear normal. Tympanic membrane is not erythematous, retracted or bulging.     Left Ear: Hearing, tympanic membrane, ear canal and external ear normal. Tympanic membrane is not erythematous, retracted or bulging.     Nose: No mucosal edema or rhinorrhea.     Right Sinus: No maxillary sinus tenderness or frontal sinus tenderness.     Left Sinus: No maxillary sinus tenderness or frontal sinus tenderness.     Mouth/Throat:     Pharynx: Uvula midline.  Eyes:     General: Lids are normal. Lids are everted, no foreign bodies appreciated.     Conjunctiva/sclera: Conjunctivae normal.     Pupils: Pupils are equal, round, and reactive to light.  Neck:     Musculoskeletal: Normal range of motion and neck supple.     Thyroid: No thyroid mass or thyromegaly.     Vascular: No carotid bruit.     Trachea: Trachea normal.  Cardiovascular:     Rate and Rhythm: Normal rate and regular rhythm.     Pulses: Normal pulses.     Heart sounds: Normal heart sounds, S1  normal and S2 normal. No murmur. No friction rub. No gallop.   Pulmonary:     Effort: Pulmonary effort is normal. No tachypnea or respiratory distress.     Breath sounds: Normal breath sounds. No decreased breath sounds, wheezing, rhonchi or rales.  Abdominal:     General: Bowel sounds are normal.     Palpations: Abdomen is soft.     Tenderness: There is no abdominal tenderness.  Musculoskeletal:     Right elbow: She exhibits decreased range of motion. She exhibits no swelling, no effusion  and no deformity. Tenderness found. Lateral epicondyle and olecranon process tenderness noted. No radial head and no medial epicondyle tenderness noted.  Skin:    General: Skin is warm and dry.     Findings: No rash.  Neurological:     Mental Status: She is alert.  Psychiatric:        Mood and Affect: Mood is not anxious or depressed.        Speech: Speech normal.        Behavior: Behavior normal. Behavior is cooperative.        Thought Content: Thought content normal.        Judgment: Judgment normal.           Assessment & Plan:  Persistent cough for 3 weeks or longer Cough now resolved following antibiotic treatment for  Possible atypical pneumonia.  Abnormal CXR Re-eval today.. likely nonspecific changes resolved with treat ment of atypical pneumonia  Lateral epicondylitis of right elbow Treat with ice, NSAIDs and exercises. Info given for pt review.

## 2019-03-21 NOTE — Patient Instructions (Signed)
We will call/MyChart you with X-ray results.  Start ibuprofen 800 mg three times daily x 10 days. With food.  Stop if stop if stomach upset.  Start home exercises and icing.

## 2019-03-21 NOTE — Assessment & Plan Note (Signed)
Treat with ice, NSAIDs and exercises. Info given for pt review.

## 2019-03-21 NOTE — Assessment & Plan Note (Signed)
Cough now resolved following antibiotic treatment for  Possible atypical pneumonia.

## 2019-03-21 NOTE — Assessment & Plan Note (Signed)
Re-eval today.. likely nonspecific changes resolved with treat ment of atypical pneumonia

## 2019-05-21 ENCOUNTER — Inpatient Hospital Stay: Payer: PRIVATE HEALTH INSURANCE | Attending: Oncology | Admitting: Oncology

## 2019-05-21 ENCOUNTER — Telehealth: Payer: Self-pay | Admitting: Oncology

## 2019-05-21 ENCOUNTER — Other Ambulatory Visit: Payer: Self-pay

## 2019-05-21 ENCOUNTER — Inpatient Hospital Stay: Payer: PRIVATE HEALTH INSURANCE

## 2019-05-21 ENCOUNTER — Telehealth: Payer: Self-pay

## 2019-05-21 VITALS — BP 141/67 | HR 80 | Temp 98.0°F | Resp 18 | Ht 64.0 in | Wt 202.5 lb

## 2019-05-21 DIAGNOSIS — M069 Rheumatoid arthritis, unspecified: Secondary | ICD-10-CM | POA: Diagnosis not present

## 2019-05-21 DIAGNOSIS — D6959 Other secondary thrombocytopenia: Secondary | ICD-10-CM | POA: Diagnosis present

## 2019-05-21 DIAGNOSIS — D696 Thrombocytopenia, unspecified: Secondary | ICD-10-CM

## 2019-05-21 DIAGNOSIS — Z79899 Other long term (current) drug therapy: Secondary | ICD-10-CM | POA: Diagnosis not present

## 2019-05-21 LAB — CBC WITH DIFFERENTIAL (CANCER CENTER ONLY)
Abs Immature Granulocytes: 0.01 10*3/uL (ref 0.00–0.07)
Basophils Absolute: 0 10*3/uL (ref 0.0–0.1)
Basophils Relative: 1 %
Eosinophils Absolute: 0.1 10*3/uL (ref 0.0–0.5)
Eosinophils Relative: 2 %
HCT: 38.5 % (ref 36.0–46.0)
Hemoglobin: 12.3 g/dL (ref 12.0–15.0)
Immature Granulocytes: 0 %
Lymphocytes Relative: 41 %
Lymphs Abs: 2.4 10*3/uL (ref 0.7–4.0)
MCH: 28.1 pg (ref 26.0–34.0)
MCHC: 31.9 g/dL (ref 30.0–36.0)
MCV: 87.9 fL (ref 80.0–100.0)
Monocytes Absolute: 0.8 10*3/uL (ref 0.1–1.0)
Monocytes Relative: 13 %
Neutro Abs: 2.5 10*3/uL (ref 1.7–7.7)
Neutrophils Relative %: 43 %
Platelet Count: 85 10*3/uL — ABNORMAL LOW (ref 150–400)
RBC: 4.38 MIL/uL (ref 3.87–5.11)
RDW: 15 % (ref 11.5–15.5)
WBC Count: 5.9 10*3/uL (ref 4.0–10.5)
nRBC: 0 % (ref 0.0–0.2)

## 2019-05-21 NOTE — Telephone Encounter (Signed)
-----   Message from Wyatt Portela, MD sent at 05/21/2019  2:54 PM EDT ----- Please let her know her platelets are stable.

## 2019-05-21 NOTE — Telephone Encounter (Signed)
Called and gave pt below msg

## 2019-05-21 NOTE — Progress Notes (Signed)
Hematology and Oncology Follow Up Visit  Rachel Rivas [id number] 1961-07-29 58 y.o. 05/21/2019 2:21 PM Diona Browner, Amy E, MDBedsole, Amy E, MD   Principle Diagnosis: 58 year old woman with thrombocytopenia related to autoimmune etiologies diagnosed April 2014.  This is in the setting of rheumatoid arthritis.   Current therapy: Active surveillance.  Interim History: Ms. Rachel Rivas returns today for a repeat evaluation.  Since the last visit, she reports no major changes in her health.  Continues to be on methotrexate without any recent complications.  She denies any active bleeding including hematochezia, melena or epistaxis.  She denies any recent hospitalizations or illnesses.   She denied any alteration mental status, neuropathy, confusion or dizziness.  Denies any headaches or lethargy.  Denies any night sweats, weight loss or changes in appetite.  Denied orthopnea, dyspnea on exertion or chest discomfort.  Denies shortness of breath, difficulty breathing hemoptysis or cough.  Denies any abdominal distention, nausea, early satiety or dyspepsia.  Denies any hematuria, frequency, dysuria or nocturia.  Denies any skin irritation, dryness or rash.  Denies any ecchymosis or petechiae.  Denies any lymphadenopathy or clotting.  Denies any heat or cold intolerance.  Denies any anxiety or depression.  Remaining review of system is negative.      Medications: I have reviewed the patient's current medications.  Current Outpatient Medications  Medication Sig Dispense Refill  . cyclobenzaprine (FLEXERIL) 10 MG tablet Take 1 tablet (10 mg total) by mouth at bedtime as needed for muscle spasms. 15 tablet 0  . folic acid (FOLVITE) 1 MG tablet Take 2 mg by mouth daily.   3  . hydrochlorothiazide (MICROZIDE) 12.5 MG capsule Take 1 capsule by mouth once daily 90 capsule 1  . ibuprofen (ADVIL,MOTRIN) 800 MG tablet Take 1 tablet (800 mg total) by mouth every 8 (eight) hours as needed for moderate pain. 30 tablet 0   . losartan (COZAAR) 50 MG tablet Take 1 tablet by mouth once daily 90 tablet 1  . methotrexate (RHEUMATREX) 2.5 MG tablet Take 8 tablets by mouth once a week.     Current Facility-Administered Medications  Medication Dose Route Frequency Provider Last Rate Last Dose  . 0.9 %  sodium chloride infusion  500 mL Intravenous Continuous Armbruster, Carlota Raspberry, MD         Allergies:  Allergies  Allergen Reactions  . Latex Itching, Dermatitis and Rash    Past Medical History, Surgical history, Social history, and Family History were reviewed and updated.   Physical Exam: Blood pressure (!) 141/67, pulse 80, temperature 98 F (36.7 C), temperature source Oral, resp. rate 18, height 5\' 4"  (1.626 m), weight 202 lb 8 oz (91.9 kg), last menstrual period 11/17/2012, SpO2 98 %. ECOG: 0    General appearance: Comfortable appearing without any discomfort Head: Normocephalic without any trauma Oropharynx: Mucous membranes are moist and pink without any thrush or ulcers. Eyes: Pupils are equal and round reactive to light. Lymph nodes: No cervical, supraclavicular, inguinal or axillary lymphadenopathy.   Heart:regular rate and rhythm.  S1 and S2 without leg edema. Lung: Clear without any rhonchi or wheezes.  No dullness to percussion. Abdomin: Soft, nontender, nondistended with good bowel sounds.  No hepatosplenomegaly. Musculoskeletal: No joint deformity or effusion.  Full range of motion noted. Neurological: No deficits noted on motor, sensory and deep tendon reflex exam. Skin: No petechial rash or dryness.  Appeared moist.    Lab Results: Lab Results  Component Value Date   WBC 6.8 12/21/2018   HGB  12.3 12/21/2018   HCT 37.8 12/21/2018   MCV 86.2 12/21/2018   PLT 81.0 (L) 12/21/2018     Chemistry      Component Value Date/Time   NA 139 12/21/2018 0900   NA 142 04/07/2017 0810   K 3.7 12/21/2018 0900   K 3.8 04/07/2017 0810   CL 100 12/21/2018 0900   CO2 32 12/21/2018 0900   CO2  28 04/07/2017 0810   BUN 19 12/21/2018 0900   BUN 16.8 04/07/2017 0810   CREATININE 0.77 12/21/2018 0900   CREATININE 0.9 04/07/2017 0810      Component Value Date/Time   CALCIUM 9.6 12/21/2018 0900   CALCIUM 9.9 04/07/2017 0810   ALKPHOS 62 12/21/2018 0900   ALKPHOS 79 04/07/2017 0810   AST 13 12/21/2018 0900   AST 18 04/07/2017 0810   ALT 15 12/21/2018 0900   ALT 23 04/07/2017 0810   BILITOT 0.8 12/21/2018 0900   BILITOT 0.70 04/07/2017 0810       Impression and Plan:   58 year old woman with the following issues:  1. Thrombocytopenia noted in 2014 related to autoimmune etiologies unlikely ITP.  This is in the setting of rheumatoid arthritis.  Her last test CBC obtained in January 2020 showed a platelet count of 80,000 which is consistent with her baseline and has been around that level since her diagnosis.  The natural course of the disease and treatment options were reviewed.  These would include steroids, IVIG, rituximab among other agents.  Indication for treatment would include bleeding or platelet count less than 30,000.  At this time she does not appear to have any indication for treatment.  We will update her platelet count today and repeat in 6 to 7 months.   2. Rheumatoid arthritis: Currently on methotrexate with disease under control.  3. Follow-up: Will be in 7 months.   15  minutes was spent with the patient face-to-face today.  More than 50% of time was spent on reviewing her disease status, differential diagnosis, treatment options and managing future plan of care.   Zola Button, MD 6/30/20202:21 PM

## 2019-05-21 NOTE — Telephone Encounter (Signed)
Scheduled per los. Patient declined printout  

## 2019-08-06 ENCOUNTER — Other Ambulatory Visit: Payer: Self-pay | Admitting: Family Medicine

## 2019-08-15 ENCOUNTER — Other Ambulatory Visit: Payer: Self-pay

## 2019-08-15 DIAGNOSIS — Z20822 Contact with and (suspected) exposure to covid-19: Secondary | ICD-10-CM

## 2019-08-16 LAB — NOVEL CORONAVIRUS, NAA: SARS-CoV-2, NAA: NOT DETECTED

## 2019-08-29 ENCOUNTER — Other Ambulatory Visit: Payer: Self-pay | Admitting: Family Medicine

## 2019-08-29 DIAGNOSIS — M25561 Pain in right knee: Secondary | ICD-10-CM

## 2019-08-29 MED ORDER — IBUPROFEN 800 MG PO TABS: 800.0000 mg | ORAL_TABLET | Freq: Three times a day (TID) | ORAL | 0 refills | Status: DC | PRN

## 2019-08-29 MED ORDER — CYCLOBENZAPRINE HCL 10 MG PO TABS: 10.0000 mg | ORAL_TABLET | Freq: Every evening | ORAL | 0 refills | Status: DC | PRN

## 2019-08-29 NOTE — Addendum Note (Signed)
Addended by: Carter Kitten on: 08/29/2019 10:14 AM   Modules accepted: Orders

## 2019-08-29 NOTE — Telephone Encounter (Signed)
Last office visit 03/21/2019 for persistent cough.  Last refilled cyclobenzaprine 03/23/2018 fjor #15 with no refills.  Ibuprofen 01/04/2019 for #30 with no refills.  CPE scheduled for 01/07/2020.

## 2019-08-29 NOTE — Telephone Encounter (Signed)
Pt is requesting a refill on Flexeril and Motrin to be sent in.

## 2019-09-10 ENCOUNTER — Ambulatory Visit (INDEPENDENT_AMBULATORY_CARE_PROVIDER_SITE_OTHER): Payer: PRIVATE HEALTH INSURANCE

## 2019-09-10 DIAGNOSIS — Z23 Encounter for immunization: Secondary | ICD-10-CM

## 2019-09-17 ENCOUNTER — Other Ambulatory Visit: Payer: Self-pay | Admitting: Family Medicine

## 2019-10-23 ENCOUNTER — Ambulatory Visit (INDEPENDENT_AMBULATORY_CARE_PROVIDER_SITE_OTHER): Payer: PRIVATE HEALTH INSURANCE | Admitting: Family Medicine

## 2019-10-23 ENCOUNTER — Encounter: Payer: Self-pay | Admitting: Family Medicine

## 2019-10-23 VITALS — Temp 96.5°F | Ht 64.0 in

## 2019-10-23 DIAGNOSIS — R0602 Shortness of breath: Secondary | ICD-10-CM | POA: Diagnosis not present

## 2019-10-23 MED ORDER — PREDNISONE 20 MG PO TABS: ORAL_TABLET | ORAL | 0 refills | Status: DC

## 2019-10-23 MED ORDER — AZITHROMYCIN 250 MG PO TABS: ORAL_TABLET | ORAL | 0 refills | Status: AC

## 2019-10-23 NOTE — Progress Notes (Signed)
Rachel Rivas T. Rachel Schewe, MD Primary Care and [place] [facility] Rachel Rivas, [id number] Phone: [phone]  FAX: [phone]  Rachel Rivas - 58 y.o. female  MRN [id number]  Date of Birth: 06-28-1961  Visit Date: 10/23/2019  PCP: Rachel Sanders, MD  Referred by: Rachel Sanders, MD Chief Complaint  Patient presents with  . Shortness of Breath    with exertion   Virtual Visit via Video Note:  I connected with  Rachel Rivas on 10/23/2019  9:40 AM EST by a video enabled telemedicine application and verified that I am speaking with the correct person using two identifiers.   Location patient: home computer, tablet, or smartphone Location provider: work or home office Consent: Verbal consent directly obtained from Rachel Rivas. Persons participating in the virtual visit: patient, provider  I discussed the limitations of evaluation and management by telemedicine and the availability of in person appointments. The patient expressed understanding and agreed to proceed.  History of Present Illness:  SOB with exertion.  She has had some shortness of breath over the last few months.  She has gained some weight, she is curious if this could have been a.  She is also developed some shortness of breath with exertion.  She denies any chest pain, tingling down her arm or into her neck.  No significant pain.  She did smoke, but this was a history of about 30 years ago.  She does have a history of rheumatoid arthritis but no known pulmonary involvement.  CP? H/o smoking  Review of Systems as above: See pertinent positives and pertinent negatives per HPI No acute distress verbally  Past Medical History, Surgical History, Social History, Family History, Problem List, Medications, and Allergies have been reviewed and updated if relevant.   Observations/Objective/Exam:  An attempt was made to discern vital signs over the phone and per  patient if applicable and possible.   General:    Alert, Oriented, appears well and in no acute distress HEENT:     Atraumatic, conjunctiva clear, no obvious abnormalities on inspection of external nose and ears.  Neck:    Normal movements of the head and neck Pulmonary:     On inspection no signs of respiratory distress, breathing rate appears normal, no obvious gross SOB, gasping or wheezing Cardiovascular:    No obvious cyanosis Musculoskeletal:    Moves all visible extremities without noticeable abnormality Psych / Neurological:     Pleasant and cooperative, no obvious depression or anxiety, speech and thought processing grossly intact  Assessment and Plan:    ICD-10-CM   1. Shortness of breath  R06.02    Level of Medical Decision-Making in this case is Moderate.   Shortness of breath of unclear origin.  Certainly could be pulmonary, I will give the placed in some steroids and a Z-Pak.  Strong precaution that if this is not showing improvement in 7 days then she needed follow-up in the office for more definitive management.  At this point I do think she is safe to be at home with close follow-up if symptoms progress or are not improving.  I discussed the assessment and treatment plan with the patient. The patient was provided an opportunity to ask questions and all were answered. The patient agreed with the plan and demonstrated an understanding of the instructions.   The patient was advised to call back or seek an in-person evaluation if the symptoms worsen or if the  condition fails to improve as anticipated.  Follow-up: prn unless noted otherwise below No follow-ups on file.  Meds ordered this encounter  Medications  . azithromycin (ZITHROMAX) 250 MG tablet    Sig: Take 2 tablets (500 mg total) by mouth daily for 1 day, THEN 1 tablet (250 mg total) daily for 4 days.    Dispense:  6 tablet    Refill:  0  . predniSONE (DELTASONE) 20 MG tablet    Sig: 2 tabs po daily for 5  days, then 1 tab po daily for 5 days    Dispense:  15 tablet    Refill:  0   No orders of the defined types were placed in this encounter.   Signed,  Maud Deed. [place], MD

## 2019-10-28 ENCOUNTER — Encounter: Payer: Self-pay | Admitting: Primary Care

## 2019-10-28 ENCOUNTER — Ambulatory Visit: Payer: PRIVATE HEALTH INSURANCE | Admitting: Primary Care

## 2019-10-28 ENCOUNTER — Telehealth: Payer: Self-pay | Admitting: Family Medicine

## 2019-10-28 ENCOUNTER — Other Ambulatory Visit: Payer: Self-pay

## 2019-10-28 VITALS — BP 144/80 | HR 62 | Temp 96.9°F | Ht 64.0 in | Wt 208.2 lb

## 2019-10-28 DIAGNOSIS — R0602 Shortness of breath: Secondary | ICD-10-CM

## 2019-10-28 DIAGNOSIS — K219 Gastro-esophageal reflux disease without esophagitis: Secondary | ICD-10-CM | POA: Diagnosis not present

## 2019-10-28 DIAGNOSIS — I10 Essential (primary) hypertension: Secondary | ICD-10-CM | POA: Diagnosis not present

## 2019-10-28 DIAGNOSIS — H6983 Other specified disorders of Eustachian tube, bilateral: Secondary | ICD-10-CM

## 2019-10-28 DIAGNOSIS — R0600 Dyspnea, unspecified: Secondary | ICD-10-CM | POA: Insufficient documentation

## 2019-10-28 MED ORDER — FAMOTIDINE 20 MG PO TABS: 20.0000 mg | ORAL_TABLET | Freq: Two times a day (BID) | ORAL | 0 refills | Status: DC

## 2019-10-28 NOTE — Assessment & Plan Note (Signed)
Daily symptoms that are uncontrolled.  Rx for famotidine sent to pharmacy for once to BID dosing. She will update if symptoms persist, encouraged weight loss.

## 2019-10-28 NOTE — Assessment & Plan Note (Signed)
Chronic for several months. Chest xray and Covid-19 testing negative.  Suspect physical deconditioning is contributing due to more sedentary lifestyle since Covid-19.  Other differentials include uncontrolled hypertension, GERD, PE (lower suspicion), cardiac cause.   ECG today with sinus bradycardia with t-wave changes in V3-5. No acute ST changes. Does appear slightly different compared to ECG from 2019.  Check labs today including CBC, CMP, d-dimer, TSH.  Consider cardiology evaluation given symptoms and ECG changes. Will consult with PCP.

## 2019-10-28 NOTE — Telephone Encounter (Signed)
Left another message on voicemail for patient to call back. 

## 2019-10-28 NOTE — Telephone Encounter (Signed)
Spoke to patient by telephone and was advised that she did a visit last week with Dr. Lorelei Pont and he gave her an antibiotic and Prednisone. Patient stated that she has been having some sinus congestion and her face has been sore. Patient stated that she went to [facility] this morning and they did a rapid covid test which came back negative and a chest x-ray which they said it was fine. Patient stated that the NP at [facility] told her if she continued to have SOB she should be checked out because she could have a blood clot. Patient stated that the SOB is more with exertion. Patient stated that she had the flu back in March which now she thinks it may have been covid and thinks she may have had pneumonia at that time also.  Okay for patient to come to the office per Allie Bossier NP after this was discuss with her. Rachel Rivas

## 2019-10-28 NOTE — Assessment & Plan Note (Signed)
Above goal in the office today, also during last visit, could be contributing to exertional SOB.  Suspect she does have elevated readings given weight gain secondary to a sedentary lifestyle since Covid-19. Discussed for her to start monitoring BP at home and to report readings that are consistently at or above 135/90.

## 2019-10-28 NOTE — Assessment & Plan Note (Signed)
Also contributing to symptoms, recommended Flonase and antihistamine.

## 2019-10-28 NOTE — Patient Instructions (Signed)
Stop by the lab prior to leaving today. I will notify you of your results once received.   Start monitoring your blood pressure daily, around the same time of day, for the next 2-3 weeks.  Ensure that you have rested for 30 minutes prior to checking your blood pressure. Record your readings and call us if your blood pressure is at or above 135/90 on a consistent basis.   I'll be in touch soon! It was a pleasure meeting you!

## 2019-10-28 NOTE — Telephone Encounter (Signed)
Yes, f/u is appropriate

## 2019-10-28 NOTE — Telephone Encounter (Signed)
Spoke to pt she went to [facility] on [address] in [place].  They did rapid covid test  Results neg.  She stated they did xray and she is waiting to see np and get results.  She will call back once she get results and let you know what they said.

## 2019-10-28 NOTE — Telephone Encounter (Signed)
Left message on voicemail for patient to call back. Patient needs to be triaged per Allie Bossier NP.

## 2019-10-28 NOTE — Progress Notes (Signed)
Subjective:    Patient ID: Rachel Rivas, female    DOB: Apr 16, 1961, 58 y.o.   MRN: [id number]  HPI  Rachel Rivas is a 58 year old female with a history of hypertension, rheumatoid arthritis on methotrexate, BPPV, ETD who presents today with a chief complaint of shortness of breath.  She was initially evaluated on 10/23/19 by Dr. Lorelei Pont with a chief complaint of shortness of breath with exertion over the last several months. She was treated with prednisone and azithromycin antibiotics with return precautions provided.  She went to [facility] today, had a rapid Covid-19 test which was negative. She also underwent a chest xray which "didn't show anything". She has her paperwork with radiology read today.   She will notice her shortness of breath with moderate exertion, resolves with rest after a few minutes. She's feeling as though her breathing is heavier than usual, taking more time for breathing effort to return to normal. She endorses a more sedentary life since Covid-19 pandemic as she's working from home at a desk from 8 am to 5 pm.  Also with headaches, ear fullness, maxillary sinus pressure, some dizziness.  She also has daily esophageal burning, belching, is currently not taking anything since she was taking ranitidine which was recalled. She denies chest pain, pain with deep inspiration.  She is compliant to her losartan 50 mg and hydrochlorothiazide 12.5mg  but is not checking BP at home.    BP Readings from Last 3 Encounters:  10/28/19 (!) 144/80  05/21/19 (!) 141/67  03/21/19 128/84   Wt Readings from Last 3 Encounters:  10/28/19 208 lb 4 oz (94.5 kg)  05/21/19 202 lb 8 oz (91.9 kg)  03/21/19 199 lb 8 oz (90.5 kg)     Review of Systems  Constitutional: Negative for fatigue.  HENT: Positive for congestion and sinus pressure.   Eyes: Negative for visual disturbance.  Respiratory: Positive for shortness of breath. Negative for cough.   Cardiovascular: Negative  for chest pain.  Allergic/Immunologic: Positive for environmental allergies.  Neurological: Positive for headaches.       Past Medical History:  Diagnosis Date  . Adenomatous colon polyp   . Allergy    occasionally  . Anemia    yrs ago, low platelets  . Arthritis    RA  . Blood transfusion without reported diagnosis    1985 with c section  . Borderline diabetic   . GERD (gastroesophageal reflux disease)   . Hypertension   . Thrombocytopenia ([place])      Social History   Socioeconomic History  . Marital status: Married    Spouse name: Not on file  . Number of children: 2  . Years of education: Not on file  . Highest education level: Not on file  Occupational History  . Occupation: English as a second language teacher: [employer]  . Financial resource strain: Not on file  . Food insecurity    Worry: Not on file    Inability: Not on file  . Transportation needs    Medical: Not on file    Non-medical: Not on file  Tobacco Use  . Smoking status: Former Smoker    Packs/day: 0.25    Years: 12.00    Pack years: 3.00    Types: Cigarettes  . Smokeless tobacco: Never Used  Substance and Sexual Activity  . Alcohol use: Yes    Alcohol/week: 0.0 standard drinks    Comment: occasional  . Drug  use: No    Comment: past; marijuana  . Sexual activity: Not on file  Lifestyle  . Physical activity    Days per week: Not on file    Minutes per session: Not on file  . Stress: Not on file  Relationships  . Social Herbalist on phone: Not on file    Gets together: Not on file    Attends religious service: Not on file    Active member of club or organization: Not on file    Attends meetings of clubs or organizations: Not on file    Relationship status: Not on file  . Intimate partner violence    Fear of current or ex partner: Not on file    Emotionally abused: Not on file    Physically abused: Not on file    Forced sexual activity: Not on file  Other  Topics Concern  . Not on file  Social History Narrative   Moved here from [place] two years ago.      Works for [employer].             Past Surgical History:  Procedure Laterality Date  . CESAREAN SECTION    . COLONOSCOPY    . POLYPECTOMY    . STERILIZATION      Family History  Problem Relation Age of Onset  . Lung cancer Mother   . Hypertension Mother   . Diabetes Mother   . Uterine cancer Mother   . Hypertension Father   . Diabetes Father   . Colon cancer Neg Hx   . Colon polyps Neg Hx   . Esophageal cancer Neg Hx   . Rectal cancer Neg Hx   . Stomach cancer Neg Hx     Allergies  Allergen Reactions  . Latex Itching, Dermatitis and Rash    Current Outpatient Medications on File Prior to Visit  Medication Sig Dispense Refill  . azithromycin (ZITHROMAX) 250 MG tablet Take 2 tablets (500 mg total) by mouth daily for 1 day, THEN 1 tablet (250 mg total) daily for 4 days. 6 tablet 0  . cyclobenzaprine (FLEXERIL) 10 MG tablet Take 1 tablet (10 mg total) by mouth at bedtime as needed for muscle spasms. 15 tablet 0  . folic acid (FOLVITE) 1 MG tablet Take 2 mg by mouth daily.   3  . hydrochlorothiazide (MICROZIDE) 12.5 MG capsule Take 1 capsule by mouth once daily 90 capsule 0  . ibuprofen (ADVIL) 800 MG tablet Take 1 tablet (800 mg total) by mouth every 8 (eight) hours as needed for moderate pain. 30 tablet 0  . losartan (COZAAR) 50 MG tablet Take 1 tablet by mouth once daily 90 tablet 1  . methotrexate (RHEUMATREX) 2.5 MG tablet Take 8 tablets by mouth once a week.    . predniSONE (DELTASONE) 20 MG tablet 2 tabs po daily for 5 days, then 1 tab po daily for 5 days     Current Facility-Administered Medications on File Prior to Visit  Medication Dose Route Frequency Provider Last Rate Last Dose  . 0.9 %  sodium chloride infusion  500 mL Intravenous Continuous Armbruster, Carlota Raspberry, MD        BP (!) 144/80   Pulse 62   Temp (!) 96.9 F (36.1 C) (Temporal)   Ht  5\' 4"  (1.626 m)   Wt 208 lb 4 oz (94.5 kg)   LMP 11/17/2012   BMI 35.75 kg/m    Objective:   Physical  Exam  Constitutional: She appears well-nourished.  HENT:  Nose: Mucosal edema present. Right sinus exhibits maxillary sinus tenderness. Left sinus exhibits maxillary sinus tenderness.  Cardiovascular: Normal rate and regular rhythm.  Respiratory: Effort normal and breath sounds normal. No respiratory distress. She has no wheezes.           Assessment & Plan:

## 2019-10-28 NOTE — Telephone Encounter (Signed)
Patient's daughter called today and stated that the patient is still having SOB.  It has Improved slightly. But she wanted to see if she can come in for a follow up and possibly an xray.   What do you suggest the patient do next since there is still some sob??

## 2019-10-28 NOTE — Telephone Encounter (Signed)
Patient called back The NP there stated that they did not see anything, she stated that they advised her that it could be a blood clot if she continues to have SOB .She feels like it would be better to see a doctor in our office.

## 2019-10-29 LAB — CBC WITH DIFFERENTIAL/PLATELET
Basophils Absolute: 0.1 10*3/uL (ref 0.0–0.1)
Basophils Relative: 0.6 % (ref 0.0–3.0)
Eosinophils Absolute: 0 10*3/uL (ref 0.0–0.7)
Eosinophils Relative: 0.3 % (ref 0.0–5.0)
HCT: 37.8 % (ref 36.0–46.0)
Hemoglobin: 12.4 g/dL (ref 12.0–15.0)
Lymphocytes Relative: 25 % (ref 12.0–46.0)
Lymphs Abs: 2.3 10*3/uL (ref 0.7–4.0)
MCHC: 32.7 g/dL (ref 30.0–36.0)
MCV: 86.8 fl (ref 78.0–100.0)
Monocytes Absolute: 0.7 10*3/uL (ref 0.1–1.0)
Monocytes Relative: 7.7 % (ref 3.0–12.0)
Neutro Abs: 6.2 10*3/uL (ref 1.4–7.7)
Neutrophils Relative %: 66.4 % (ref 43.0–77.0)
Platelets: 158 10*3/uL (ref 150.0–400.0)
RBC: 4.35 Mil/uL (ref 3.87–5.11)
RDW: 15.7 % — ABNORMAL HIGH (ref 11.5–15.5)
WBC: 9.4 10*3/uL (ref 4.0–10.5)

## 2019-10-29 LAB — D-DIMER, QUANTITATIVE: D-Dimer, Quant: 0.28 mcg/mL FEU (ref <0.50)

## 2019-10-29 LAB — BASIC METABOLIC PANEL
BUN: 18 mg/dL (ref 6–23)
CO2: 32 mEq/L (ref 19–32)
Calcium: 9.7 mg/dL (ref 8.4–10.5)
Chloride: 99 mEq/L (ref 96–112)
Creatinine, Ser: 0.86 mg/dL (ref 0.40–1.20)
GFR: 81.44 mL/min (ref >60.00)
Glucose, Bld: 127 mg/dL — ABNORMAL HIGH (ref 70–99)
Potassium: 3.3 mEq/L — ABNORMAL LOW (ref 3.5–5.1)
Sodium: 140 mEq/L (ref 135–145)

## 2019-10-29 LAB — TSH: TSH: 0.7 u[IU]/mL (ref 0.35–4.50)

## 2019-10-29 LAB — HEMOGLOBIN A1C: Hgb A1c MFr Bld: 6.1 % (ref 4.6–6.5)

## 2019-10-30 ENCOUNTER — Telehealth: Payer: Self-pay | Admitting: Family Medicine

## 2019-10-30 NOTE — Telephone Encounter (Signed)
Spoken and notified patient of [name]'s comments. Patient verbalized understanding.  

## 2019-10-30 NOTE — Telephone Encounter (Signed)
Pt returned your call   Pt stated she never received my charts

## 2019-10-31 ENCOUNTER — Other Ambulatory Visit: Payer: Self-pay | Admitting: Primary Care

## 2019-10-31 DIAGNOSIS — R0602 Shortness of breath: Secondary | ICD-10-CM

## 2019-11-04 ENCOUNTER — Ambulatory Visit: Payer: PRIVATE HEALTH INSURANCE | Admitting: Cardiology

## 2019-11-05 ENCOUNTER — Encounter: Payer: Self-pay | Admitting: Cardiology

## 2019-11-05 ENCOUNTER — Ambulatory Visit: Payer: PRIVATE HEALTH INSURANCE | Admitting: Cardiology

## 2019-11-05 ENCOUNTER — Other Ambulatory Visit: Payer: Self-pay

## 2019-11-05 ENCOUNTER — Ambulatory Visit (INDEPENDENT_AMBULATORY_CARE_PROVIDER_SITE_OTHER): Payer: PRIVATE HEALTH INSURANCE | Admitting: Cardiology

## 2019-11-05 VITALS — BP 122/90 | HR 63 | Ht 63.0 in | Wt 206.4 lb

## 2019-11-05 DIAGNOSIS — I10 Essential (primary) hypertension: Secondary | ICD-10-CM | POA: Diagnosis not present

## 2019-11-05 DIAGNOSIS — Z7182 Exercise counseling: Secondary | ICD-10-CM

## 2019-11-05 DIAGNOSIS — Z8249 Family history of ischemic heart disease and other diseases of the circulatory system: Secondary | ICD-10-CM

## 2019-11-05 DIAGNOSIS — Z87891 Personal history of nicotine dependence: Secondary | ICD-10-CM | POA: Diagnosis not present

## 2019-11-05 DIAGNOSIS — Z713 Dietary counseling and surveillance: Secondary | ICD-10-CM

## 2019-11-05 DIAGNOSIS — R0602 Shortness of breath: Secondary | ICD-10-CM

## 2019-11-05 DIAGNOSIS — Z6836 Body mass index (BMI) 36.0-36.9, adult: Secondary | ICD-10-CM

## 2019-11-05 DIAGNOSIS — Z7189 Other specified counseling: Secondary | ICD-10-CM

## 2019-11-05 NOTE — Patient Instructions (Signed)
Medication Instructions:  Your Physician recommend you continue on your current medication as directed.    *If you need a refill on your cardiac medications before your next appointment, please call your pharmacy*  Lab Work: None  Testing/Procedures: Your physician has requested that you have an echocardiogram. Echocardiography is a painless test that uses sound waves to create images of your heart. It provides your doctor with information about the size and shape of your heart and how well your heart's chambers and valves are working. This procedure takes approximately one hour. There are no restrictions for this procedure. [place] [address]   Follow-Up: At [facility], you and your health needs are our priority.  As part of our continuing mission to provide you with exceptional heart care, we have created designated Provider Care Teams.  These Care Teams include your primary Cardiologist (physician) and Advanced Practice Providers (APPs -  Physician Assistants and Nurse Practitioners) who all work together to provide you with the care you need, when you need it.  Your next appointment:   1 year(s)  The format for your next appointment:   In Person  Provider:   Buford Dresser, MD

## 2019-11-05 NOTE — Progress Notes (Signed)
Cardiology Office Note:    Date:  11/05/2019   ID:  Rachel Rivas, DOB July 01, 1961, MRN [id number]  PCP:  [name], MD  Cardiologist:  Buford Dresser, MD  Referring MD: Pleas Koch, NP   CC: new patient consult for shortness of breath  History of Present Illness:    Rachel Rivas is a 58 y.o. female with a hx of hypertension, rheumatoid arthritis who is seen as a new consult at the request of Pleas Koch, NP for the evaluation and management of shortness of breath.  She reports over the last few months (around October 2020), especially since the covid pandemic, that she is short of breath with less exertion than before. She does endorse a more sedentary lifestyle. She has had several chest x-rays, with one 01/2019 noted as abnormal but then normalized on repeat 02/2019. She has issues with sinusitis and BPPV as well. Recent PCP visit noted sinus tenderness as well.  Shortness of breath is worse when "I get myself worked up about something." Also notices now walking from her apartment to her car (right outside her door, at most 1-2 minute walk) she feels out of breath. Sometimes also notices shortness of breath just sitting still.   She notes that in 01/2019 she was told she had the flu, had issues with her breathing then. Breathing improved until this fall. No prior breathing issues in the past. Never been told she has asthma, though she was treated "as if I had asthma" when she was sick in March.   Has used a rescue inhaler with improvement in recent days.  Cardiovascular risk factors: Prior clinical ASCVD: was told she had a heart attack based on her ECG last week, but has never been evaluated for this. No documented MI or CVA. She is very anxious about her ECG today (which is fine) Comorbid conditions: hypertension, "borderline" hyperlipidemia, "borderline" diabetes. No history of chronic kidney disease:  Metabolic syndrome/Obesity: currently at highest adult  weight Chronic inflammatory conditions: rheumatoid arthritis, but reports it is well managed and doesn't bother her. Very rarely has a flare Tobacco use history: former, quit about 40 years ago Family history:no known MI or CAD. Mother has hypertension, diabetes, cancer. Father had high blood pressure, diabetes, stroke. Both died age 22. Most of her family has lived to very old age. Prior cardiac testing and/or incidental findings on other testing (ie coronary calcium): told she had a murmur in the past. Had a stress test in 2014 (chest heaviness), normal Exercise level: used to walk a lot, but has been walking less due to sciatic nerve pain. Predominantly sedentary. Can climb stairs, etc if she has to but tries to avoid. Current diet: "not good." Eats pasta, fried foods, etc.   Denies chest pain. No PND, orthopnea (baseline 3 pillows), LE edema or unexpected weight gain. No syncope, rare brief palpitations.  Past Medical History:  Diagnosis Date  . Adenomatous colon polyp   . Allergy    occasionally  . Anemia    yrs ago, low platelets  . Arthritis    RA  . Blood transfusion without reported diagnosis    1985 with c section  . Borderline diabetic   . GERD (gastroesophageal reflux disease)   . Hypertension   . Thrombocytopenia ([place])     Past Surgical History:  Procedure Laterality Date  . CESAREAN SECTION    . COLONOSCOPY    . POLYPECTOMY    . STERILIZATION      Current  Medications: Current Outpatient Medications on File Prior to Visit  Medication Sig  . cyclobenzaprine (FLEXERIL) 10 MG tablet Take 1 tablet (10 mg total) by mouth at bedtime as needed for muscle spasms.  . famotidine (PEPCID) 20 MG tablet Take 1 tablet (20 mg total) by mouth 2 (two) times daily. For heartburn.  . folic acid (FOLVITE) 1 MG tablet Take 2 mg by mouth daily.   . hydrochlorothiazide (MICROZIDE) 12.5 MG capsule Take 1 capsule by mouth once daily  . ibuprofen (ADVIL) 800 MG tablet Take 1 tablet (800  mg total) by mouth every 8 (eight) hours as needed for moderate pain.  Marland Kitchen losartan (COZAAR) 50 MG tablet Take 1 tablet by mouth once daily  . methotrexate (RHEUMATREX) 2.5 MG tablet Take 8 tablets by mouth once a week.   Current Facility-Administered Medications on File Prior to Visit  Medication  . 0.9 %  sodium chloride infusion     Allergies:   Latex and Sulfur   Social History   Tobacco Use  . Smoking status: Former Smoker    Packs/day: 0.25    Years: 12.00    Pack years: 3.00    Types: Cigarettes  . Smokeless tobacco: Never Used  Substance Use Topics  . Alcohol use: Yes    Alcohol/week: 0.0 standard drinks    Comment: occasional  . Drug use: No    Comment: past; marijuana    Family History: family history includes Diabetes in her father and mother; Hypertension in her father and mother; Lung cancer in her mother; Uterine cancer in her mother. There is no history of Colon cancer, Colon polyps, Esophageal cancer, Rectal cancer, or Stomach cancer.  ROS:   Please see the history of present illness.  Additional pertinent ROS: Constitutional: Negative for chills, fever, night sweats, unintentional weight loss  HENT: Negative for ear pain and hearing loss.   Eyes: Negative for loss of vision and eye pain.  Respiratory: Negative for cough, sputum, wheezing.   Cardiovascular: See HPI. Gastrointestinal: Negative for abdominal pain, melena, and hematochezia.  Genitourinary: Negative for dysuria and hematuria.  Musculoskeletal: Negative for falls and myalgias.  Skin: Negative for itching and rash.  Neurological: Negative for focal weakness, focal sensory changes and loss of consciousness. Has occasional tingling in fingers and toes.  Endo/Heme/Allergies: Does bruise/bleed easily.     EKGs/Labs/Other Studies Reviewed:    The following studies were reviewed today: Nuclear stress test 04/08/2013 Impression Exercise Capacity:  The patient had only fair exercise capacity. BP  Response:  Normal blood pressure response. Clinical Symptoms:  Early in stress the patient had some chest pressure. This persisted into recovery. She was given a nitroglycerin and at that time had decreased blood pressure. She was put in the Trendelenburg position and then stabilized. At the end she had a mild residual headache. ECG Impression:  The resting EKG reveals mild nonspecific ST scooping. This becomes somewhat worse with stress but it is not diagnostic. It normalizes early in the recovery period. Comparison with Prior Nuclear Study: No images to compare  Overall Impression:  The patient had a lot of symptoms during the study. She received a nitroglycerin and then had some decreased blood pressure. The EKGs are nonspecific. The nuclear images are normal. There is no scar or ischemia. This is a low risk scan.  LV Ejection Fraction: 69%.  LV Wall Motion:  Normal Wall Motion.   EKG:  EKG is personally reviewed.  The ekg ordered today demonstrates NSR, no significant Q  waves, normal R wave progression. Mildly flat t waves but not inverted, similar to 10/28/2019 and 05/28/2018 ECG.  Recent Labs: 12/21/2018: ALT 15 10/28/2019: BUN 18; Creatinine, Ser 0.86; Hemoglobin 12.4; Platelets 158.0; Potassium 3.3; Sodium 140; TSH 0.70  Recent Lipid Panel    Component Value Date/Time   CHOL 173 12/21/2018 0900   TRIG 73.0 12/21/2018 0900   HDL 68.40 12/21/2018 0900   CHOLHDL 3 12/21/2018 0900   VLDL 14.6 12/21/2018 0900   LDLCALC 90 12/21/2018 0900    Physical Exam:    VS:  BP 122/90   Pulse 63   Ht 5\' 3"  (1.6 m)   Wt 206 lb 6.4 oz (93.6 kg)   LMP 11/17/2012   BMI 36.56 kg/m     Wt Readings from Last 3 Encounters:  11/05/19 206 lb 6.4 oz (93.6 kg)  10/28/19 208 lb 4 oz (94.5 kg)  05/21/19 202 lb 8 oz (91.9 kg)    GEN: Well nourished, well developed in no acute distress HEENT: Normal, moist mucous membranes NECK: No JVD CARDIAC: regular rhythm, normal S1 and S2, no rubs or gallops. No  murmurs. VASCULAR: Radial and DP pulses 2+ bilaterally. No carotid bruits RESPIRATORY:  Clear to auscultation without rales, wheezing or rhonchi  ABDOMEN: Soft, non-tender, non-distended MUSCULOSKELETAL:  Ambulates independently SKIN: Warm and dry, no edema NEUROLOGIC:  Alert and oriented x 3. No focal neuro deficits noted. PSYCHIATRIC:  Normal affect    ASSESSMENT:    1. Shortness of breath   2. Essential hypertension   3. Class 2 severe obesity due to excess calories with serious comorbidity and body mass index (BMI) of 36.0 to 36.9 in adult ([place])   4. Former tobacco use   5. Cardiac risk counseling   6. Counseling on health promotion and disease prevention   7. Nutritional counseling   8. Exercise counseling    PLAN:    Shortness of breath: most prominent with exertion, but also present at rest. No true PND/orthopnea, but feels mild shortness of breath in bed. Wide differential.  -From a cardiac perspective, no significant rhythm/electrical abnormalities.  -Rule out structural/functional cardiac issues with echo. Appears euvolemic, no signs/symptoms of heart failure or valve disease today. -has prior normal stress test. If echo normal, low suspicion for cardiac etiology given lack of other symptoms. However, if EF/wall motion abnormality on echo, then would investigate further -if echo normal, would recommend PFTs given her reported improvement with use of inhaler -if cardiac and lung eval within normal limits, would encourage exercise conditioning  Cardiac risk counseling and prevention recommendations: -recommend heart healthy/Mediterranean diet, with whole grains, fruits, vegetable, fish, lean meats, nuts, and olive oil. Limit salt. -recommend moderate walking, 3-5 times/week for 30-50 minutes each session. Aim for at least 150 minutes.week. Goal should be pace of 3 miles/hours, or walking 1.5 miles in 30 minutes -recommend avoidance of tobacco products. Avoid excess  alcohol. -Additional risk factor control:  -Diabetes risk: A1c is 6.1. Counseled on diet/lifestyle/ewight management today  -Lipids: LDL 90, TG 73. ASCVD score is low, counseled on diet/exercise but no indication for statin today. Does have RA as inflammatory risk factor, would aggressively try to improve her lifestyle to optimize her risk and consider statin in the future.  -Blood pressure control: Hypertension is currently well controlled today, continue losartan and HCTZ  -Weight: class 2 obesity, BMI 36. Would benefit from weight loss  -tobacco: former tobacco use, none recent -ASCVD risk score: The 10-year ASCVD risk score Mikey Bussing DC  Brooke Bonito., et al., 2013) is: 4.6%   Values used to calculate the score:     Age: 52 years     Sex: Female     Is Non-Hispanic African American: Yes     Diabetic: No     Tobacco smoker: No     Systolic Blood Pressure: 123XX123 mmHg     Is BP treated: Yes     HDL Cholesterol: 68.4 mg/dL     Total Cholesterol: 173 mg/dL    Plan for follow up: 1 year for prevention/risk factor modification  Medication Adjustments/Labs and Tests Ordered: Current medicines are reviewed at length with the patient today.  Concerns regarding medicines are outlined above.  Orders Placed This Encounter  Procedures  . EKG 12-Lead  . ECHOCARDIOGRAM COMPLETE   No orders of the defined types were placed in this encounter.   Patient Instructions  Medication Instructions:  Your Physician recommend you continue on your current medication as directed.    *If you need a refill on your cardiac medications before your next appointment, please call your pharmacy*  Lab Work: None  Testing/Procedures: Your physician has requested that you have an echocardiogram. Echocardiography is a painless test that uses sound waves to create images of your heart. It provides your doctor with information about the size and shape of your heart and how well your heart's chambers and valves are working. This  procedure takes approximately one hour. There are no restrictions for this procedure. [place] [address]   Follow-Up: At [facility], you and your health needs are our priority.  As part of our continuing mission to provide you with exceptional heart care, we have created designated Provider Care Teams.  These Care Teams include your primary Cardiologist (physician) and Advanced Practice Providers (APPs -  Physician Assistants and Nurse Practitioners) who all work together to provide you with the care you need, when you need it.  Your next appointment:   1 year(s)  The format for your next appointment:   In Person  Provider:   Buford Dresser, MD      Signed, Buford Dresser, MD PhD 11/05/2019 9:46 AM    [place]

## 2019-11-19 ENCOUNTER — Other Ambulatory Visit (HOSPITAL_COMMUNITY): Payer: PRIVATE HEALTH INSURANCE

## 2019-11-20 ENCOUNTER — Telehealth: Payer: Self-pay | Admitting: Oncology

## 2019-11-20 ENCOUNTER — Inpatient Hospital Stay: Payer: PRIVATE HEALTH INSURANCE | Attending: Oncology | Admitting: Oncology

## 2019-11-20 ENCOUNTER — Inpatient Hospital Stay: Payer: PRIVATE HEALTH INSURANCE

## 2019-11-20 NOTE — Telephone Encounter (Signed)
Returned patient's phone call regarding rescheduling an appointment, per patient's request 12/30 missed appointment has moved to 01/27.

## 2019-11-22 DIAGNOSIS — C801 Malignant (primary) neoplasm, unspecified: Secondary | ICD-10-CM

## 2019-11-22 HISTORY — DX: Malignant (primary) neoplasm, unspecified: C80.1

## 2019-12-06 ENCOUNTER — Other Ambulatory Visit: Payer: PRIVATE HEALTH INSURANCE

## 2019-12-17 ENCOUNTER — Telehealth: Payer: Self-pay | Admitting: Oncology

## 2019-12-17 NOTE — Telephone Encounter (Signed)
Rescheduled per 1/26 sch msg. Called and spoke with pt, pt confirmed appt

## 2019-12-18 ENCOUNTER — Inpatient Hospital Stay: Payer: PRIVATE HEALTH INSURANCE | Admitting: Oncology

## 2019-12-18 ENCOUNTER — Inpatient Hospital Stay: Payer: PRIVATE HEALTH INSURANCE

## 2019-12-24 ENCOUNTER — Encounter (HOSPITAL_COMMUNITY): Payer: Self-pay | Admitting: Radiology

## 2019-12-26 ENCOUNTER — Other Ambulatory Visit: Payer: Self-pay | Admitting: Family Medicine

## 2019-12-27 ENCOUNTER — Telehealth: Payer: Self-pay | Admitting: Family Medicine

## 2019-12-27 DIAGNOSIS — R7303 Prediabetes: Secondary | ICD-10-CM

## 2019-12-27 DIAGNOSIS — I10 Essential (primary) hypertension: Secondary | ICD-10-CM

## 2019-12-27 DIAGNOSIS — D696 Thrombocytopenia, unspecified: Secondary | ICD-10-CM

## 2019-12-27 NOTE — Telephone Encounter (Signed)
-----   Message from Ellamae Sia sent at 12/20/2019  9:48 AM EST ----- Regarding: Lab orders for Monday, 2.8.21 Patient is scheduled for CPX labs, please order future labs, Thanks , Karna Christmas

## 2019-12-30 ENCOUNTER — Other Ambulatory Visit (INDEPENDENT_AMBULATORY_CARE_PROVIDER_SITE_OTHER): Payer: PRIVATE HEALTH INSURANCE

## 2019-12-30 ENCOUNTER — Other Ambulatory Visit: Payer: Self-pay

## 2019-12-30 DIAGNOSIS — R7303 Prediabetes: Secondary | ICD-10-CM

## 2019-12-30 DIAGNOSIS — I10 Essential (primary) hypertension: Secondary | ICD-10-CM

## 2019-12-30 LAB — COMPREHENSIVE METABOLIC PANEL
ALT: 18 U/L (ref 0–35)
AST: 18 U/L (ref 0–37)
Albumin: 4.5 g/dL (ref 3.5–5.2)
Alkaline Phosphatase: 69 U/L (ref 39–117)
BUN: 20 mg/dL (ref 6–23)
CO2: 31 mEq/L (ref 19–32)
Calcium: 9.6 mg/dL (ref 8.4–10.5)
Chloride: 101 mEq/L (ref 96–112)
Creatinine, Ser: 0.89 mg/dL (ref 0.40–1.20)
GFR: 78.23 mL/min (ref >60.00)
Glucose, Bld: 114 mg/dL — ABNORMAL HIGH (ref 70–99)
Potassium: 3.7 mEq/L (ref 3.5–5.1)
Sodium: 139 mEq/L (ref 135–145)
Total Bilirubin: 0.7 mg/dL (ref 0.2–1.2)
Total Protein: 7.8 g/dL (ref 6.0–8.3)

## 2019-12-30 LAB — LIPID PANEL
Cholesterol: 179 mg/dL (ref 0–200)
HDL: 73.5 mg/dL (ref >39.00)
LDL Cholesterol: 92 mg/dL (ref 0–99)
NonHDL: 105.57
Total CHOL/HDL Ratio: 2
Triglycerides: 66 mg/dL (ref 0.0–149.0)
VLDL: 13.2 mg/dL (ref 0.0–40.0)

## 2019-12-30 LAB — HEMOGLOBIN A1C: Hgb A1c MFr Bld: 6.2 % (ref 4.6–6.5)

## 2019-12-31 NOTE — Progress Notes (Signed)
No critical labs need to be addressed urgently. We will discuss labs in detail at upcoming office visit.   

## 2020-01-07 ENCOUNTER — Encounter: Payer: Self-pay | Admitting: Family Medicine

## 2020-01-07 ENCOUNTER — Ambulatory Visit (INDEPENDENT_AMBULATORY_CARE_PROVIDER_SITE_OTHER): Payer: PRIVATE HEALTH INSURANCE | Admitting: Family Medicine

## 2020-01-07 ENCOUNTER — Other Ambulatory Visit: Payer: Self-pay

## 2020-01-07 VITALS — BP 120/72 | HR 77 | Temp 98.0°F | Ht 63.5 in | Wt 206.8 lb

## 2020-01-07 DIAGNOSIS — Z0000 Encounter for general adult medical examination without abnormal findings: Secondary | ICD-10-CM

## 2020-01-07 DIAGNOSIS — M069 Rheumatoid arthritis, unspecified: Secondary | ICD-10-CM

## 2020-01-07 DIAGNOSIS — Z6836 Body mass index (BMI) 36.0-36.9, adult: Secondary | ICD-10-CM

## 2020-01-07 DIAGNOSIS — I10 Essential (primary) hypertension: Secondary | ICD-10-CM

## 2020-01-07 DIAGNOSIS — IMO0001 Reserved for inherently not codable concepts without codable children: Secondary | ICD-10-CM | POA: Insufficient documentation

## 2020-01-07 DIAGNOSIS — R7303 Prediabetes: Secondary | ICD-10-CM

## 2020-01-07 DIAGNOSIS — D696 Thrombocytopenia, unspecified: Secondary | ICD-10-CM

## 2020-01-07 DIAGNOSIS — G4733 Obstructive sleep apnea (adult) (pediatric): Secondary | ICD-10-CM

## 2020-01-07 DIAGNOSIS — S93402A Sprain of unspecified ligament of left ankle, initial encounter: Secondary | ICD-10-CM

## 2020-01-07 HISTORY — DX: Obstructive sleep apnea (adult) (pediatric): G47.33

## 2020-01-07 NOTE — Assessment & Plan Note (Signed)
Stable and followed by D.r Shdad. Appt later this week.

## 2020-01-07 NOTE — Assessment & Plan Note (Signed)
Needs re-eval and likely treatment with CPAp. Pt now has insurance.  refer back to Dr. Elsworth Soho.

## 2020-01-07 NOTE — Assessment & Plan Note (Signed)
Ice, lace up ankle brace x 2 weeks. Home rehab exercises given.

## 2020-01-07 NOTE — Progress Notes (Signed)
Chief Complaint  Patient presents with  . Annual Exam    History of Present Illness: HPI The patient is here for annual wellness exam and preventative care.    Saw Cardiology Dr. Harrell Gave. For eval of chronic SOB with exertion following flu/?COVID 01/2019. ECHO recommended.. never had given timing. Past stress test nml.  Continues to improve over time.  Thrombocytopenia related to autoimmune etiologies diagnosed April 2014.: Eval  by Dr. Clement Husbands ONC.   Stable per 04/2019 note.  RA:  Stable control on methotrexate. Folowed by Dr. Marijean Bravo.  Hypertension:    Borderline control in office today on HCTZ and losartan. BP Readings from Last 3 Encounters:  11/05/19 122/90  10/28/19 (!) 144/80  05/21/19 (!) 141/67  Using medication without problems or lightheadedness:  Chest pain with exertion: none Edema: none Short of breath: yes Average home BPs: Other issues: HOME TEST positive in 2016 for sleep apnea.. never started CPAP given cost... now has insurance.  tired in AM, AM headache, snores  Cholesterol well controlled. Minimal exercise, moderate diet.   Slightly worsening control. prediabetes  Lab Results  Component Value Date   HGBA1C 6.2 12/30/2019    Wt Readings from Last 3 Encounters:  01/07/20 206 lb 12 oz (93.8 kg)  11/05/19 206 lb 6.4 oz (93.6 kg)  10/28/19 208 lb 4 oz (94.5 kg)      This visit occurred during the SARS-CoV-2 public health emergency.  Safety protocols were in place, including screening questions prior to the visit, additional usage of staff PPE, and extensive cleaning of exam room while observing appropriate contact time as indicated for disinfecting solutions.   COVID 19 screen:  No recent travel or known exposure to COVID19 The patient denies respiratory symptoms of COVID 19 at this time. The importance of social distancing was discussed today.     Review of Systems  Constitutional: Negative for chills and fever.  HENT: Negative for  congestion and ear pain.        Snoring  Eyes: Negative for pain and redness.  Respiratory: Negative for cough and shortness of breath.   Cardiovascular: Negative for chest pain, palpitations and leg swelling.  Gastrointestinal: Negative for abdominal pain, blood in stool, constipation, diarrhea, nausea and vomiting.  Genitourinary: Negative for dysuria.  Musculoskeletal: Negative for falls and myalgias.  Skin: Negative for rash.  Neurological: Negative for dizziness.  Psychiatric/Behavioral: Negative for depression. The patient is not nervous/anxious.       Past Medical History:  Diagnosis Date  . Adenomatous colon polyp   . Allergy    occasionally  . Anemia    yrs ago, low platelets  . Arthritis    RA  . Blood transfusion without reported diagnosis    1985 with c section  . Borderline diabetic   . GERD (gastroesophageal reflux disease)   . Hypertension   . Thrombocytopenia ([place])     reports that she has quit smoking. Her smoking use included cigarettes. She has a 3.00 pack-year smoking history. She has never used smokeless tobacco. She reports current alcohol use. She reports that she does not use drugs.   Current Outpatient Medications:  .  cyclobenzaprine (FLEXERIL) 10 MG tablet, Take 1 tablet (10 mg total) by mouth at bedtime as needed for muscle spasms., Disp: 15 tablet, Rfl: 0 .  famotidine (PEPCID) 20 MG tablet, Take 1 tablet (20 mg total) by mouth 2 (two) times daily. For heartburn., Disp: 180 tablet, Rfl: 0 .  folic acid (FOLVITE) 1 MG tablet,  Take 2 mg by mouth daily. , Disp: , Rfl: 3 .  hydrochlorothiazide (MICROZIDE) 12.5 MG capsule, Take 1 capsule by mouth once daily, Disp: 90 capsule, Rfl: 0 .  ibuprofen (ADVIL) 800 MG tablet, Take 1 tablet (800 mg total) by mouth every 8 (eight) hours as needed for moderate pain., Disp: 30 tablet, Rfl: 0 .  losartan (COZAAR) 50 MG tablet, Take 1 tablet by mouth once daily, Disp: 90 tablet, Rfl: 1 .  methotrexate (RHEUMATREX) 2.5  MG tablet, Take 8 tablets by mouth once a week., Disp: , Rfl:   Current Facility-Administered Medications:  .  0.9 %  sodium chloride infusion, 500 mL, Intravenous, Continuous, Armbruster, Carlota Raspberry, MD   Observations/Objective: Last menstrual period 11/17/2012.  Physical Exam Constitutional:      General: She is not in acute distress.    Appearance: Normal appearance. She is well-developed. She is not ill-appearing or toxic-appearing.  HENT:     Head: Normocephalic.     Right Ear: Hearing, tympanic membrane, ear canal and external ear normal.     Left Ear: Hearing, tympanic membrane, ear canal and external ear normal.     Nose: Nose normal.  Eyes:     General: Lids are normal. Lids are everted, no foreign bodies appreciated.     Conjunctiva/sclera: Conjunctivae normal.     Pupils: Pupils are equal, round, and reactive to light.  Neck:     Thyroid: No thyroid mass or thyromegaly.     Vascular: No carotid bruit.     Trachea: Trachea normal.  Cardiovascular:     Rate and Rhythm: Normal rate and regular rhythm.     Heart sounds: Normal heart sounds, S1 normal and S2 normal. No murmur. No gallop.   Pulmonary:     Effort: Pulmonary effort is normal. No respiratory distress.     Breath sounds: Normal breath sounds. No wheezing, rhonchi or rales.  Abdominal:     General: Bowel sounds are normal. There is no distension or abdominal bruit.     Palpations: Abdomen is soft. There is no fluid wave or mass.     Tenderness: There is no abdominal tenderness. There is no guarding or rebound.     Hernia: No hernia is present. There is no hernia in the left inguinal area or right inguinal area.  Genitourinary:    Pubic Area: No rash.      Labia:        Right: No rash.        Left: No rash or tenderness.      Urethra: No prolapse, urethral pain, urethral swelling or urethral lesion.     Vagina: Normal.     Uterus: Normal.      Adnexa: Right adnexa normal and left adnexa normal.       Right:  No mass, tenderness or fullness.         Left: No mass, tenderness or fullness.    Musculoskeletal:     Cervical back: Normal range of motion and neck supple.     Right ankle: Normal.     Left ankle: Swelling present. Tenderness present. Decreased range of motion.     Comments: ttp noted over lateral ankle ant and inf... after twisted ankle several months ago. Did not treat.  Lymphadenopathy:     Cervical: No cervical adenopathy.     Lower Body: No right inguinal adenopathy. No left inguinal adenopathy.  Skin:    General: Skin is warm and dry.  Findings: No rash.  Neurological:     Mental Status: She is alert.     Cranial Nerves: No cranial nerve deficit.     Sensory: No sensory deficit.  Psychiatric:        Mood and Affect: Mood is not anxious or depressed.        Speech: Speech normal.        Behavior: Behavior normal. Behavior is cooperative.        Judgment: Judgment normal.      Assessment and Plan The patient's preventative maintenance and recommended screening tests for an annual wellness exam were reviewed in full today. Brought up to date unless services declined.  Counselled on the importance of diet, exercise, and its role in overall health and mortality. The patient's FH and SH was reviewed, including their home life, tobacco status, and drug and alcohol status.   Mammo: 12/2018.Marland Kitchen per patient in [place]. Colon: 02/2016 adenoma, Dr. Havery Moros, repeat in 5 years. Former smoker minimal use, 3 pack years, remote Vaccines: flu refused and tdap refused.  2018nml pap, no HPVSpace to every 5years, yearly DVE. Mother died with uterine cancer.  Hep C neg HIV: refused Bone density: prednisone use in past.. no family history .Marland Kitchen Start bone density screening age 9... plans this year.   Rheumatoid arthritis (HCC) Stable control on methotrexate. Folowed by Dr. Marijean Bravo.  Essential hypertension Maybe uncontrolled du to Sleep pnea not treated. Follow Bps at home and  call with measurements in next several weeks.  Obstructive sleep apnea syndrome Needs re-eval and likely treatment with CPAp. Pt now has insurance.  refer back to Dr. Elsworth Soho.  Grade 2 ankle sprain, left, initial encounter Ice, lace up ankle brace x 2 weeks. Home rehab exercises given.  Class 2 severe obesity due to excess calories with serious comorbidity and body mass index (BMI) of 36.0 to 36.9 in adult [facility]) Refer to nutritionist for prediabetes and weight loss.  Thrombocytopenia ([place]) Stable and followed by D.r Shdad. Appt later this week.     Eliezer Lofts, MD

## 2020-01-07 NOTE — Patient Instructions (Addendum)
Look into getting BP cuff arm..  Follow BP at home.Marland Kitchen goal <140/90 . Call with measurements in 1-2 weeks.  Restart regular exercise, work on low carb diet.  We will call you to set up nutritionist and sleep eval.  Set up mammogram  and consider bone density this year..call to let us know where so we can put in order.

## 2020-01-07 NOTE — Assessment & Plan Note (Signed)
Maybe uncontrolled du to Sleep pnea not treated. Follow Bps at home and call with measurements in next several weeks.

## 2020-01-07 NOTE — Assessment & Plan Note (Signed)
Stable control on methotrexate. Folowed by Dr. Marijean Bravo.

## 2020-01-07 NOTE — Assessment & Plan Note (Signed)
Refer to nutritionist for prediabetes and weight loss.

## 2020-01-10 ENCOUNTER — Inpatient Hospital Stay: Payer: Self-pay

## 2020-01-10 ENCOUNTER — Inpatient Hospital Stay: Payer: Self-pay | Admitting: Oncology

## 2020-01-10 ENCOUNTER — Telehealth: Payer: Self-pay | Admitting: Oncology

## 2020-01-10 NOTE — Telephone Encounter (Signed)
Scheduled 4mth f/u appt per MD request. Called and left msg. Mailed printout

## 2020-01-14 ENCOUNTER — Telehealth (HOSPITAL_COMMUNITY): Payer: Self-pay | Admitting: Radiology

## 2020-01-14 NOTE — Telephone Encounter (Signed)
Just an FYI. We have made several attempts to contact this patient including sending a letter to schedule or reschedule their echocardiogram. We will be removing the patient from the echo [place]. 12/24/19 @1244  Letter mailed evd  1.12.21 @ spoke with patient had not called will call today and call back evd  1.5.21 @ 1100 spoke with patient will call insurance-will call back to reschedule evd  11/18/19 Patient canceled will call back to reschedule evd  Thank you

## 2020-01-14 NOTE — Telephone Encounter (Signed)
Noted  

## 2020-01-22 LAB — HM MAMMOGRAPHY

## 2020-01-23 ENCOUNTER — Encounter: Payer: Self-pay | Admitting: Family Medicine

## 2020-01-29 ENCOUNTER — Encounter: Payer: Self-pay | Admitting: Family Medicine

## 2020-02-10 ENCOUNTER — Other Ambulatory Visit: Payer: Self-pay

## 2020-02-10 ENCOUNTER — Ambulatory Visit (INDEPENDENT_AMBULATORY_CARE_PROVIDER_SITE_OTHER): Payer: PRIVATE HEALTH INSURANCE | Admitting: Pulmonary Disease

## 2020-02-10 ENCOUNTER — Encounter: Payer: Self-pay | Admitting: Pulmonary Disease

## 2020-02-10 VITALS — BP 120/70 | HR 56 | Temp 97.3°F | Ht 64.57 in | Wt 208.0 lb

## 2020-02-10 DIAGNOSIS — G4733 Obstructive sleep apnea (adult) (pediatric): Secondary | ICD-10-CM

## 2020-02-10 NOTE — Progress Notes (Signed)
Subjective:    Patient ID: Rachel Rivas, female    DOB: January 06, 1961, 59 y.o.   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 She presented in 2016 with snoring and nonrefreshing sleep and home sleep test showed moderate OSA.  She was prescribed CPAP then but did not obtain this due to cost issues. She now presents to reestablish. Epworth sleepiness score is 6 and she reports sleepiness while watching TV, sitting quietly after lunch.  She denies problems driving.  Husband has noted loud snoring, husband uses a CPAP machine.  She reports AM headaches and constant fatigue and nonrefreshing sleep.  Bedtime is between 10:11 PM, sleep latency is minimal, she sleeps on her side with 3-4 pillows, denies nocturnal awakenings or nocturia and is out of bed by 6 AM feeling tired with dryness of mouth and morning headaches which seem to subside spontaneously with time. There is no history suggestive of cataplexy, sleep paralysis or parasomnias  She has gained 11 pounds from 197 in 2016 to her current weight of 208 pounds  PMH -hypertension, rheumatoid arthritis on methotrexate  Significant tests/ events reviewed  HST 01/2015 - wt 1 9 7  pounds -AHI 20/hour  Past Medical History:  Diagnosis Date  . Adenomatous colon polyp   . Allergy    occasionally  . Anemia    yrs ago, low platelets  . Arthritis    RA  . Blood transfusion without reported diagnosis    1985 with c section  . Borderline diabetic   . GERD (gastroesophageal reflux disease)   . Hypertension   . Obstructive sleep apnea syndrome 01/07/2020  . Thrombocytopenia ([place])     Past Surgical History:  Procedure Laterality Date  . CESAREAN SECTION    . COLONOSCOPY    . POLYPECTOMY    . STERILIZATION     Allergies  Allergen Reactions  . Latex Itching, Dermatitis and Rash  . Sulfur Nausea Only    Social History   Socioeconomic History  . Marital status: Married      Spouse name: Not on file  . Number of children: 2  . Years of education: Not on file  . Highest education level: Not on file  Occupational History  . 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 Smoking status: Former Smoker    Packs/day: 0.25    Years: 12.00    Pack years: 3.00    Types: Cigarettes  . Smokeless tobacco: Never Used  Substance and Sexual Activity  . Alcohol use: Yes    Alcohol/week: 0.0 standard drinks    Comment: occasional  . Drug use: No    Comment: past; marijuana  . Sexual activity: Not on file  Other Topics Concern  . Not on file  Social History Narrative   Moved here from [place] two years ago.      Works for [employer].            Social Determinants of Health   Financial Resource Strain:   . Difficulty of Paying Living Expenses:   Food Insecurity:   . Worried About Charity fundraiser in the Last Year:   . Arboriculturist in the Last Year:   Transportation Needs:   . Film/video editor (Medical):   Marland Kitchen Lack of Transportation (Non-Medical):   Physical Activity:   . Days of Exercise per Week:   . Minutes of Exercise  per Session:   Stress:   . Feeling of Stress :   Social Connections:   . Frequency of Communication with Friends and Family:   . Frequency of Social Gatherings with Friends and Family:   . Attends Religious Services:   . Active Member of Clubs or Organizations:   . Attends Archivist Meetings:   Marland Kitchen Marital Status:   Intimate Partner Violence:   . Fear of Current or Ex-Partner:   . Emotionally Abused:   Marland Kitchen Physically Abused:   . Sexually Abused:     Family History  Problem Relation Age of Onset  . Lung cancer Mother   . Hypertension Mother   . Diabetes Mother   . Uterine cancer Mother   . Hypertension Father   . Diabetes Father   . Colon cancer Neg Hx   . Colon polyps Neg Hx   . Esophageal cancer Neg Hx   . Rectal cancer Neg Hx   . Stomach cancer Neg Hx        Review of Systems Constitutional: negative for anorexia, fevers and sweats  Eyes: negative for irritation, redness and visual disturbance  Ears, nose, mouth, throat, and face: negative for earaches, epistaxis, nasal congestion and sore throat  Respiratory: negative for cough, dyspnea on exertion, sputum and wheezing  Cardiovascular: negative for chest pain, dyspnea, lower extremity edema, orthopnea, palpitations and syncope  Gastrointestinal: negative for abdominal pain, constipation, diarrhea, melena, nausea and vomiting  Genitourinary:negative for dysuria, frequency and hematuria  Hematologic/lymphatic: negative for bleeding, easy bruising and lymphadenopathy  Musculoskeletal:negative for arthralgias, muscle weakness and stiff joints  Neurological: negative for coordination problems, gait problems, headaches and weakness  Endocrine: negative for diabetic symptoms including polydipsia, polyuria and weight loss     Objective:   Physical Exam  Gen. Pleasant, well-nourished, in no distress ENT - no thrush, no pallor/icterus,no post nasal drip Neck: No JVD, no thyromegaly, no carotid bruits Lungs: no use of accessory muscles, no dullness to percussion, clear without rales or rhonchi  Cardiovascular: Rhythm regular, heart sounds  normal, no murmurs or gallops, no peripheral edema Musculoskeletal: No deformities, no cyanosis or clubbing        Assessment & Plan:

## 2020-02-10 NOTE — Assessment & Plan Note (Signed)
She does have moderate OSA This puts her at mild to moderate cardiovascular risk , being postmenopausal We will proceed with repeat home sleep test to reassess severity. The pathophysiology of obstructive sleep apnea , it's cardiovascular consequences & modes of treatment including CPAP were discused with the patient in detail & they evidenced understanding. Alternative treatments including dental appliance and inspire device were also discussed  Weight loss encouraged, compliance with goal of at least 4-6 hrs every night is the expectation. Advised against medications with sedative side effects Cautioned against driving when sleepy - understanding that sleepiness will vary on a day to day basis

## 2020-02-10 NOTE — Patient Instructions (Signed)
Schedule home sleep test We discussed treatment options with CPAP and nasal mask

## 2020-02-13 ENCOUNTER — Other Ambulatory Visit: Payer: Self-pay | Admitting: Family Medicine

## 2020-02-13 ENCOUNTER — Other Ambulatory Visit: Payer: Self-pay | Admitting: Primary Care

## 2020-02-13 DIAGNOSIS — K219 Gastro-esophageal reflux disease without esophagitis: Secondary | ICD-10-CM

## 2020-02-14 NOTE — Telephone Encounter (Signed)
Last prescribed on 10/28/2019 by Allie Bossier for acute. Last appointment on 01/07/2020 with Dr Diona Browner for cpe. Next future appointment on 01/08/2021

## 2020-03-03 ENCOUNTER — Ambulatory Visit (INDEPENDENT_AMBULATORY_CARE_PROVIDER_SITE_OTHER): Payer: PRIVATE HEALTH INSURANCE | Admitting: Family Medicine

## 2020-03-03 ENCOUNTER — Encounter: Payer: Self-pay | Admitting: Family Medicine

## 2020-03-03 ENCOUNTER — Other Ambulatory Visit: Payer: Self-pay

## 2020-03-03 VITALS — BP 112/68 | HR 63 | Temp 97.4°F | Resp 97 | Ht 64.0 in | Wt 206.0 lb

## 2020-03-03 DIAGNOSIS — D696 Thrombocytopenia, unspecified: Secondary | ICD-10-CM

## 2020-03-03 DIAGNOSIS — Z6836 Body mass index (BMI) 36.0-36.9, adult: Secondary | ICD-10-CM

## 2020-03-03 DIAGNOSIS — Z809 Family history of malignant neoplasm, unspecified: Secondary | ICD-10-CM | POA: Diagnosis not present

## 2020-03-03 DIAGNOSIS — N950 Postmenopausal bleeding: Secondary | ICD-10-CM

## 2020-03-03 LAB — CBC WITH DIFFERENTIAL/PLATELET
Basophils Absolute: 0 10*3/uL (ref 0.0–0.1)
Basophils Relative: 0.5 % (ref 0.0–3.0)
Eosinophils Absolute: 0.1 10*3/uL (ref 0.0–0.7)
Eosinophils Relative: 1.8 % (ref 0.0–5.0)
HCT: 36.1 % (ref 36.0–46.0)
Hemoglobin: 12 g/dL (ref 12.0–15.0)
Lymphocytes Relative: 31.6 % (ref 12.0–46.0)
Lymphs Abs: 2.1 10*3/uL (ref 0.7–4.0)
MCHC: 33.3 g/dL (ref 30.0–36.0)
MCV: 86 fl (ref 78.0–100.0)
Monocytes Absolute: 0.7 10*3/uL (ref 0.1–1.0)
Monocytes Relative: 10 % (ref 3.0–12.0)
Neutro Abs: 3.7 10*3/uL (ref 1.4–7.7)
Neutrophils Relative %: 56.1 % (ref 43.0–77.0)
Platelets: 99 10*3/uL — ABNORMAL LOW (ref 150.0–400.0)
RBC: 4.2 Mil/uL (ref 3.87–5.11)
RDW: 15.4 % (ref 11.5–15.5)
WBC: 6.6 10*3/uL (ref 4.0–10.5)

## 2020-03-03 NOTE — Patient Instructions (Signed)
Please stop at the lab to have labs drawn. You will be called to set up the ultrasound and the GYN referral.

## 2020-03-03 NOTE — Progress Notes (Signed)
Chief Complaint  Patient presents with  . Vaginal Bleeding    Started Thursday 02/27/20 - Also having right sided lower abdominal pain, along with lower back pain & cramping     History of Present Illness: HPI    59 year old post menopausal female presents with new onset vaginal bleeding. Associated with  pain and cramping in RLQ suprapubic, low back  Pain as well.  Started as spotting on  In March ( small bright red blood/ brownish discharge on toilet tissue) and again in 02/20/2020. She states it feels like she is ovulating. Heavier bleeding started on 02/28/2020.  Changing pad every 30-45 min. Bleeding decrease back to changing tampon/pad every 4-5 hours in last 3 days.  Took motrin for cramping.  No dysuria. No blood in stool.  Last menses age 33.    no new meds, no blood thinnner.   no nose bleeds, no gum bleeding.   Hx of thrombocytopenia Platelets 10/2019... 158.  Followed by Dr. Alen Blew  Mother with endometrial cancer.   This visit occurred during the SARS-CoV-2 public health emergency.  Safety protocols were in place, including screening questions prior to the visit, additional usage of staff PPE, and extensive cleaning of exam room while observing appropriate contact time as indicated for disinfecting solutions.   COVID 19 screen:  No recent travel or known exposure to COVID19 The patient denies respiratory symptoms of COVID 19 at this time. The importance of social distancing was discussed today.     Review of Systems  Constitutional: Negative for chills and fever.  HENT: Negative for congestion and ear pain.   Eyes: Negative for pain and redness.  Respiratory: Negative for cough and shortness of breath.   Cardiovascular: Negative for chest pain, palpitations and leg swelling.  Gastrointestinal: Positive for abdominal pain. Negative for blood in stool, constipation, diarrhea, nausea and vomiting.  Genitourinary: Negative for dysuria.  Musculoskeletal: Negative  for falls and myalgias.  Skin: Negative for rash.  Neurological: Negative for dizziness.  Psychiatric/Behavioral: Negative for depression. The patient is not nervous/anxious.       Past Medical History:  Diagnosis Date  . Adenomatous colon polyp   . Allergy    occasionally  . Anemia    yrs ago, low platelets  . Arthritis    RA  . Blood transfusion without reported diagnosis    1985 with c section  . Borderline diabetic   . GERD (gastroesophageal reflux disease)   . Hypertension   . Obstructive sleep apnea syndrome 01/07/2020  . Thrombocytopenia ([place])     reports that she has quit smoking. Her smoking use included cigarettes. She has a 3.00 pack-year smoking history. She has never used smokeless tobacco. She reports current alcohol use. She reports that she does not use drugs.   Current Outpatient Medications:  .  cyclobenzaprine (FLEXERIL) 10 MG tablet, Take 1 tablet (10 mg total) by mouth at bedtime as needed for muscle spasms., Disp: 15 tablet, Rfl: 0 .  famotidine (PEPCID) 20 MG tablet, TAKE 1 TABLET BY MOUTH TWICE DAILY FOR  HEARTBURN, Disp: 180 tablet, Rfl: 0 .  folic acid (FOLVITE) 1 MG tablet, Take 2 mg by mouth daily. , Disp: , Rfl: 3 .  hydrochlorothiazide (MICROZIDE) 12.5 MG capsule, Take 1 capsule by mouth once daily, Disp: 90 capsule, Rfl: 0 .  ibuprofen (ADVIL) 800 MG tablet, Take 1 tablet (800 mg total) by mouth every 8 (eight) hours as needed for moderate pain., Disp: 30 tablet, Rfl: 0 .  losartan (COZAAR) 50 MG tablet, Take 1 tablet by mouth once daily, Disp: 90 tablet, Rfl: 1 .  methotrexate (RHEUMATREX) 2.5 MG tablet, Take 8 tablets by mouth once a week., Disp: , Rfl:    Observations/Objective: Blood pressure 112/68, pulse 63, temperature (!) 97.4 F (36.3 C), temperature source Temporal, resp. rate (!) 97, height 5\' 4"  (1.626 m), weight 206 lb (93.4 kg), last menstrual period 11/17/2012, SpO2 98 %.  Physical Exam Constitutional:      General: She is not in  acute distress.    Appearance: Normal appearance. She is well-developed. She is not ill-appearing or toxic-appearing.  HENT:     Head: Normocephalic.     Right Ear: Hearing, tympanic membrane, ear canal and external ear normal. Tympanic membrane is not erythematous, retracted or bulging.     Left Ear: Hearing, tympanic membrane, ear canal and external ear normal. Tympanic membrane is not erythematous, retracted or bulging.     Nose: No mucosal edema or rhinorrhea.     Right Sinus: No maxillary sinus tenderness or frontal sinus tenderness.     Left Sinus: No maxillary sinus tenderness or frontal sinus tenderness.     Mouth/Throat:     Pharynx: Uvula midline.  Eyes:     General: Lids are normal. Lids are everted, no foreign bodies appreciated.     Conjunctiva/sclera: Conjunctivae normal.     Pupils: Pupils are equal, round, and reactive to light.  Neck:     Thyroid: No thyroid mass or thyromegaly.     Vascular: No carotid bruit.     Trachea: Trachea normal.  Cardiovascular:     Rate and Rhythm: Normal rate and regular rhythm.     Pulses: Normal pulses.     Heart sounds: Normal heart sounds, S1 normal and S2 normal. No murmur. No friction rub. No gallop.   Pulmonary:     Effort: Pulmonary effort is normal. No tachypnea or respiratory distress.     Breath sounds: Normal breath sounds. No decreased breath sounds, wheezing, rhonchi or rales.  Abdominal:     General: Bowel sounds are normal.     Palpations: Abdomen is soft.     Tenderness: There is abdominal tenderness.  Genitourinary:    Uterus: Enlarged and tender.      Comments: Blood in cervical canal, unable to clearly visualize cervical os Musculoskeletal:     Cervical back: Normal range of motion and neck supple.  Skin:    General: Skin is warm and dry.     Findings: No rash.  Neurological:     Mental Status: She is alert.  Psychiatric:        Mood and Affect: Mood is not anxious or depressed.        Speech: Speech normal.         Behavior: Behavior normal. Behavior is cooperative.        Thought Content: Thought content normal.        Judgment: Judgment normal.      Assessment and Plan   Post-menopausal bleeding  Eval with [place] followed by referral to GYn for endometrial biopsy.  Thrombocytopenia ([place]) Eval with cbc for hemoglobin as well as for worsening platelet levels that could be contributing to bleeding.  Class 2 severe obesity due to excess calories with serious comorbidity and body mass index (BMI) of 36.0 to 36.9 in adult [facility]) Higher risk for endometrial cancer as this is type her mother passed away from.     Eliezer Lofts, MD

## 2020-03-05 ENCOUNTER — Ambulatory Visit: Payer: PRIVATE HEALTH INSURANCE | Admitting: Family Medicine

## 2020-03-06 ENCOUNTER — Telehealth: Payer: Self-pay

## 2020-03-06 ENCOUNTER — Ambulatory Visit
Admission: RE | Admit: 2020-03-06 | Discharge: 2020-03-06 | Disposition: A | Discharge status: Home / Self Care | Payer: PRIVATE HEALTH INSURANCE | Source: Ambulatory Visit | Attending: Family Medicine | Admitting: Family Medicine

## 2020-03-06 DIAGNOSIS — N950 Postmenopausal bleeding: Secondary | ICD-10-CM

## 2020-03-06 NOTE — Telephone Encounter (Signed)
Received a call from [place] from [facility] for [place] on patient. Results in epic:  IMPRESSION: Markedly thickened heterogeneous, and hypervascular endometrial complex up to 38 mm thick highly concerning for endometrial neoplasm.  Additional more focal 3.0 x 2.8 x 5.3 cm diameter mass within endocervical canal, may represent extension of above endometrial pathology versus separate endocervical tumor.  Tissue diagnosis recommended.  Nonvisualization of ovaries.

## 2020-03-09 ENCOUNTER — Ambulatory Visit: Payer: PRIVATE HEALTH INSURANCE

## 2020-03-09 ENCOUNTER — Other Ambulatory Visit: Payer: Self-pay

## 2020-03-09 DIAGNOSIS — G4733 Obstructive sleep apnea (adult) (pediatric): Secondary | ICD-10-CM

## 2020-03-10 ENCOUNTER — Other Ambulatory Visit: Payer: Self-pay

## 2020-03-12 ENCOUNTER — Encounter: Payer: Self-pay | Admitting: Nurse Practitioner

## 2020-03-12 ENCOUNTER — Ambulatory Visit (INDEPENDENT_AMBULATORY_CARE_PROVIDER_SITE_OTHER): Payer: PRIVATE HEALTH INSURANCE | Admitting: Nurse Practitioner

## 2020-03-12 ENCOUNTER — Other Ambulatory Visit: Payer: Self-pay

## 2020-03-12 VITALS — BP 130/80 | Ht 63.0 in | Wt 205.0 lb

## 2020-03-12 DIAGNOSIS — N950 Postmenopausal bleeding: Secondary | ICD-10-CM | POA: Diagnosis not present

## 2020-03-12 DIAGNOSIS — R9389 Abnormal findings on diagnostic imaging of other specified body structures: Secondary | ICD-10-CM

## 2020-03-12 MED ORDER — MEGESTROL ACETATE 40 MG PO TABS: 40.0000 mg | ORAL_TABLET | Freq: Two times a day (BID) | ORAL | 1 refills | Status: DC

## 2020-03-12 NOTE — Patient Instructions (Addendum)
Take Megace daily until sonohysterogram if you experience heavy bleeding   Postmenopausal Bleeding  Postmenopausal bleeding is any bleeding that occurs after menopause. Menopause is when a woman's period stops. Any type of bleeding after menopause should be checked by your doctor. Treatment will depend on the cause. Follow these instructions at home:  Pay attention to any changes in your symptoms.  Avoid using tampons and douches as told by your doctor.  Change your pads regularly.  Get regular pelvic exams and Pap tests.  Take iron pills as told by your doctor.  Take over-the-counter and prescription medicines only as told by your doctor.  Keep all follow-up visits as told by your doctor. This is important. Contact a doctor if:  Your bleeding lasts for more than 1 week.  You have pain in your belly (abdomen).  You have bleeding during or after sex.  You have bleeding that happens more often than every 3 weeks. Get help right away if:  You have fever, chills, headache, dizziness, muscle aches, or bleeding.  You have very bad pain with bleeding.  You have clumps of blood (blood clots) coming from your vagina.  You have a lot of bleeding, and: ? You use more than 1 pad an hour. ? This kind of bleeding has never happened before.  You feel like you are going to pass out (faint). Summary  Any type of bleeding after menopause should be checked by your doctor.  Pay attention to any changes in your symptoms.  Keep all follow-up visits as told by your doctor. This information is not intended to replace advice given to you by your health care provider. Make sure you discuss any questions you have with your health care provider. Document Revised: 01/24/2019 Document Reviewed: 12/13/2016 Elsevier Patient Education  2020 [place] is a procedure to examine the inside of the uterus. This exam uses sound waves that are sent to a  computer to make images of the lining of the uterus (endometrium). To get the best images, a germ-free, salt-water solution (sterile saline) is put into the uterus through the vagina. You may have this procedure if you have certain reproductive problems, such as abnormal bleeding, infertility, or miscarriage. This procedure can show what may be causing these problems. Possible causes include scarring or abnormal growths such as fibroids inside your uterus. It can also show if your uterus is an abnormal shape or if the lining of the uterus is too thin. Tell a health care provider about:  All medicines you are taking, including vitamins, herbs, eye drops, creams, and over-the-counter medicines.  Any allergies you have.  Any blood disorders you have.  Any surgeries you have had.  Any medical conditions you have.  Whether you are pregnant or may be pregnant.  The date of the first day of your last period.  Any signs of infection, such as fever, pain in your lower abdomen, or abnormal discharge from your vagina. What are the risks? Generally, this is a safe procedure. However, problems may occur, including:  Abdominal pain or cramping.  Light bleeding (spotting).  Increased vaginal discharge.  Infection. What happens before the procedure?  Your health care provider may have you take an over-the-counter pain medicine.  You may be given medicine to stop any abnormal bleeding.  You may be given antibiotic medicine to help prevent infection.  You may be asked to take a pregnancy test. This is usually in the form of a urine  test.  You may have a pelvic exam.  You will be asked to empty your bladder. What happens during the procedure?  You will lie down on the exam table with your feet in stirrups or with your knees bent and your feet flat on the table.  A slender, handheld device (transducer) will be lubricated and placed into your vagina.  The transducer will be positioned to  send sound waves to your uterus. The sound waves are sent to a computer and are turned into images, which your health care provider sees during the procedure.  The transducer will be removed from your vagina.  An instrument will be inserted to widen the opening of your vagina (speculum).  A swab with germ-killing solution (antiseptic) will be used to clean the opening to your uterus (cervix).  A long, thin tube (catheter) will be placed through your cervix into your uterus.  The speculum will be removed.  The transducer will be placed back into your vagina to take more images.  Your uterus will be filled with a germ-free, salt-water solution (sterile saline) through the catheter. You may feel some cramping.  A fluid that contains air bubbles may be sent through the catheter to make it easier to see the fallopian tubes.  The transducer and catheter will be removed. The procedure may vary among health care providers and hospitals. What happens after the procedure?  It is up to you to get the results of your procedure. Ask your health care provider, or the department that is doing the procedure, when your results will be ready. Summary  A sonohysterogram is a procedure that creates images of the inside of the uterus.  The risks of this procedure are very low. Most women experience cramping and spotting after the procedure.  You may need to have a pelvic exam and take a pregnancy test before this procedure. This procedure will not be done if you are pregnant or have an infection. This information is not intended to replace advice given to you by your health care provider. Make sure you discuss any questions you have with your health care provider. Document Revised: 10/20/2017 Document Reviewed: 10/03/2016 Elsevier Patient Education  2020 [employer].

## 2020-03-12 NOTE — Progress Notes (Signed)
   Rachel Rivas 08-09-1961 [id number]   History:  59 y.o. MBF G3 P2 presents with complaints of postmenopausal bleeding that began 2 weeks ago.  She has been menopausal for 10 years.  Bleeding was heavy like a period Initially but is now spotting. Saw PCP for this 03/03/2020, ultrasound was performed 03/06/2020: Thickened heterogeneous and hypervascular endometrial complex up to 38 mm thick highly concerning for endometrial neoplasm.  3.0 x 2.8 x 5.3 cm diameter endocervical canal, may represent extension of endometrial pathology versus separate endocervical tumor.  Mother had endometrial cancer.  Sees hematology for thrombocytopenia.  Gynecologic History Patient's last menstrual period was 11/17/2012.   Contraception: post menopausal status Last Pap:12/21/2016 . Results were: normal Last mammogram: 01/22/2020. Results were: normal  Past medical history, past surgical history, family history and social history were all reviewed and documented in the EPIC chart.  ROS:  A ROS was performed and pertinent positives and negatives are included.  Exam:  Vitals:   03/12/20 0858  BP: 130/80  Weight: 205 lb (93 kg)  Height: 5\' 3"  (1.6 m)   Body mass index is 36.31 kg/m.  General appearance:  Normal Thyroid:  Symmetrical, normal in size, without palpable masses or nodularity. Respiratory  Auscultation:  Clear without wheezing or rhonchi Cardiovascular  Auscultation:  Regular rate, without rubs, murmurs or gallops  Edema/varicosities:  Not grossly evident Abdominal  Soft, tender to palpation of right lower abdomen, without masses, guarding present  Liver/spleen:  No organomegaly noted  Hernia:  None appreciated  Skin  Inspection:  Grossly normal   Breasts: Examined lying and sitting.   Right: Without masses, retractions, discharge or axillary adenopathy.   Left: Without masses, retractions, discharge or axillary adenopathy. Gentitourinary   Inguinal/mons:  Normal without inguinal  adenopathy  External genitalia:  Normal  BUS/Urethra/Skene's glands:  Normal  Vagina:  Normal, blood present  Cervix:  Normal, blood present  Uterus:  Anteverted  Adnexa/parametria:     Rt: Without masses or tenderness.   Lt: Without masses or tenderness.  Anus and perineum: Normal  Assessment/Plan:  59 y.o.  for postmenopausal bleeding.  Postmenopausal bleeding - Plan: megestrol (MEGACE) 40 MG tablet  Thickened endometrium  Will schedule sonohysterogram with endometrial biopsy.  Megace 40 mg twice a day if experiencing heavy bleeding.  Daughter present for visit and all questions answered.    Tamela Gammon [facility], 9:37 AM 03/12/2020

## 2020-03-13 ENCOUNTER — Other Ambulatory Visit: Payer: Self-pay | Admitting: Nurse Practitioner

## 2020-03-13 DIAGNOSIS — N950 Postmenopausal bleeding: Secondary | ICD-10-CM

## 2020-03-14 ENCOUNTER — Encounter (HOSPITAL_COMMUNITY): Payer: Self-pay

## 2020-03-14 ENCOUNTER — Telehealth: Payer: Self-pay | Admitting: Obstetrics and Gynecology

## 2020-03-14 ENCOUNTER — Other Ambulatory Visit: Payer: Self-pay

## 2020-03-14 ENCOUNTER — Emergency Department (HOSPITAL_COMMUNITY)
Admission: EM | Admit: 2020-03-14 | Discharge: 2020-03-14 | Disposition: A | Discharge status: Home / Self Care | Payer: PRIVATE HEALTH INSURANCE | Attending: Emergency Medicine | Admitting: Emergency Medicine

## 2020-03-14 DIAGNOSIS — Z91040 Latex allergy status: Secondary | ICD-10-CM | POA: Diagnosis not present

## 2020-03-14 DIAGNOSIS — I10 Essential (primary) hypertension: Secondary | ICD-10-CM | POA: Diagnosis not present

## 2020-03-14 DIAGNOSIS — Z87891 Personal history of nicotine dependence: Secondary | ICD-10-CM | POA: Diagnosis not present

## 2020-03-14 DIAGNOSIS — R1900 Intra-abdominal and pelvic swelling, mass and lump, unspecified site: Secondary | ICD-10-CM | POA: Diagnosis not present

## 2020-03-14 DIAGNOSIS — Z79899 Other long term (current) drug therapy: Secondary | ICD-10-CM | POA: Diagnosis not present

## 2020-03-14 DIAGNOSIS — R102 Pelvic and perineal pain: Secondary | ICD-10-CM | POA: Insufficient documentation

## 2020-03-14 DIAGNOSIS — N939 Abnormal uterine and vaginal bleeding, unspecified: Secondary | ICD-10-CM | POA: Diagnosis not present

## 2020-03-14 LAB — CBC WITH DIFFERENTIAL/PLATELET
HCT: 36.1 % (ref 36.0–46.0)
Hemoglobin: 11.6 g/dL — ABNORMAL LOW (ref 12.0–15.0)
MCH: 28.5 pg (ref 26.0–34.0)
MCHC: 32.1 g/dL (ref 30.0–36.0)
MCV: 88.7 fL (ref 80.0–100.0)
Platelets: 137 10*3/uL — ABNORMAL LOW (ref 150–400)
RBC: 4.07 MIL/uL (ref 3.87–5.11)
RDW: 15.1 % (ref 11.5–15.5)
WBC: 11.4 10*3/uL — ABNORMAL HIGH (ref 4.0–10.5)
nRBC: 0 % (ref 0.0–0.2)

## 2020-03-14 LAB — COMPREHENSIVE METABOLIC PANEL
ALT: 20 U/L (ref 0–44)
AST: 16 U/L (ref 15–41)
Albumin: 4.7 g/dL (ref 3.5–5.0)
Alkaline Phosphatase: 68 U/L (ref 38–126)
Anion gap: 11 (ref 5–15)
BUN: 15 mg/dL (ref 6–20)
CO2: 27 mmol/L (ref 22–32)
Calcium: 9.6 mg/dL (ref 8.9–10.3)
Chloride: 101 mmol/L (ref 98–111)
Creatinine, Ser: 0.84 mg/dL (ref 0.44–1.00)
GFR calc Af Amer: >60 mL/min (ref >60)
GFR calc non Af Amer: >60 mL/min (ref >60)
Glucose, Bld: 125 mg/dL — ABNORMAL HIGH (ref 70–99)
Potassium: 3.3 mmol/L — ABNORMAL LOW (ref 3.5–5.1)
Sodium: 139 mmol/L (ref 135–145)
Total Bilirubin: 0.7 mg/dL (ref 0.3–1.2)
Total Protein: 8.2 g/dL — ABNORMAL HIGH (ref 6.5–8.1)

## 2020-03-14 LAB — I-STAT BETA HCG BLOOD, ED (MC, WL, AP ONLY): I-stat hCG, quantitative: <5 m[IU]/mL (ref <5)

## 2020-03-14 NOTE — Discharge Instructions (Signed)
Continue take your Megace as instructed.  Please return to the ED for worsening bleeding or if you feel you are going to pass out or do pass out.

## 2020-03-14 NOTE — Telephone Encounter (Signed)
After hours phone call from patient.   Patient calling with recurrent postmenopausal bleeding.  Seen by PCP initially who ordered US showing thickened endometrium with increased vascularity and a large endocervical mass potentially extending into the endometrium.  Seen by NP on 03/12/20 at which time she was spotting only.  Given Rx for Megace 40 mg po bid to take if bleeding increased.  Set up for sonohysterogram and EMB with Dr. Dellis Filbert for 03/24/20.   Patient calling now with increased bleeding since last hs.  Pad change up to ever 45 minutes and feels rectal pressure.  Denies dizziness, lightheadedness, shortness of breath, palpitations, fatigue, leg cramping.  States she has low platelets.   Platelets 99,000 and Hgb 12.0 on 03/03/20.   Took on Megace 40 mg at noon.   I recommended ER evaluation, and the patient would like to avoid this at this time.   I instructed her to increase the Megace to 80 mg po tid and to go to the hospital if the bleeding does not improve or if she develops the above symptoms.    She will call the office on 4/26 in follow up.  She is quite anxious to complete her evaluation.

## 2020-03-14 NOTE — ED Provider Notes (Signed)
[facility] DEPT Provider Note   CSN: [id number] Arrival date & time: 03/14/20  2001     History Chief Complaint  Patient presents with  . Vaginal Bleeding    Rachel Rivas is a 59 y.o. female.  59 yo F recently diagnosed with a pelvic mass comes in with a chief complaint of vaginal bleeding.  Is been going on for a few days.  She has been in close contact with her OB/GYN who was started on Megace.  Increase the dose today but continued to have bleeding eventually decided to come into the ED for evaluation.  Has had some cramping but it has gotten somewhat better with naproxen.  No fevers no syncopal episodes.  The history is provided by the patient and a relative.  Vaginal Bleeding Quality:  Clots and bright red Severity:  Moderate Onset quality:  Gradual Duration:  2 days Timing:  Constant Progression:  Worsening Chronicity:  New Relieved by:  Nothing Worsened by:  Nothing Ineffective treatments:  None tried Associated symptoms: no dizziness, no dysuria, no fever and no nausea        Past Medical History:  Diagnosis Date  . Adenomatous colon polyp   . Allergy    occasionally  . Anemia    yrs ago, low platelets  . Arthritis    RA  . Blood transfusion without reported diagnosis    1985 with c section  . Borderline diabetic   . GERD (gastroesophageal reflux disease)   . Hypertension   . Obstructive sleep apnea syndrome 01/07/2020  . Thrombocytopenia [facility])     Patient Active Problem List   Diagnosis Date Noted  . Family history of cancer in mother 03/03/2020  . Grade 2 ankle sprain, left, initial encounter 01/07/2020  . Obstructive sleep apnea syndrome 01/07/2020  . Former tobacco use 11/05/2019  . Class 2 severe obesity due to excess calories with serious comorbidity and body mass index (BMI) of 36.0 to 36.9 in adult [facility]) 11/05/2019  . Shortness of breath 10/28/2019  . Gastroesophageal reflux disease 10/28/2019  . Lateral  epicondylitis of right elbow 03/21/2019  . Abnormal CXR 02/21/2019  . Persistent cough for 3 weeks or longer 02/13/2019  . BPPV (benign paroxysmal positional vertigo), right 05/31/2018  . ETD (Eustachian tube dysfunction), bilateral 03/23/2018  . Atopic dermatitis 02/28/2017  . Seborrheic keratoses, inflamed 02/28/2017  . Rheumatoid arthritis ([place]) 03/06/2015  . Thrombocytopenia ([place]) 02/26/2015  . Prediabetes 11/04/2014  . Acute sinus infection 05/26/2014  . Essential hypertension     Past Surgical History:  Procedure Laterality Date  . CESAREAN SECTION    . COLONOSCOPY    . POLYPECTOMY    . STERILIZATION    . TUBAL LIGATION       OB History    Gravida  3   Para  2   Term      Preterm      AB  1   Living  2     SAB      TAB  1   Ectopic      Multiple      Live Births              Family History  Problem Relation Age of Onset  . Lung cancer Mother   . Hypertension Mother   . Diabetes Mother   . Uterine cancer Mother   . Hypertension Father   . Diabetes Father   . Stroke Father   . Colon cancer  Neg Hx   . Colon polyps Neg Hx   . Esophageal cancer Neg Hx   . Rectal cancer Neg Hx   . Stomach cancer Neg Hx     Social History   Tobacco Use  . Smoking status: Former Smoker    Packs/day: 0.25    Years: 12.00    Pack years: 3.00    Types: Cigarettes  . Smokeless tobacco: Never Used  Substance Use Topics  . Alcohol use: Yes    Alcohol/week: 0.0 standard drinks    Comment: occasional  . Drug use: No    Comment: past; marijuana    Home Medications Prior to Admission medications   Medication Sig Start Date End Date Taking? Authorizing Provider  cyclobenzaprine (FLEXERIL) 10 MG tablet Take 1 tablet (10 mg total) by mouth at bedtime as needed for muscle spasms. 08/29/19  Yes Bedsole, Amy E, MD  famotidine (PEPCID) 20 MG tablet TAKE 1 TABLET BY MOUTH TWICE DAILY FOR  HEARTBURN Patient taking differently: Take 20 mg by mouth 2 (two) times  daily.  02/14/20  Yes Bedsole, Amy E, MD  folic acid (FOLVITE) 1 MG tablet Take 2 mg by mouth daily.  08/03/16  Yes [provider]  hydrochlorothiazide (MICROZIDE) 12.5 MG capsule Take 1 capsule by mouth once daily 12/26/19  Yes Bedsole, Amy E, MD  ibuprofen (ADVIL) 800 MG tablet Take 1 tablet (800 mg total) by mouth every 8 (eight) hours as needed for moderate pain. 08/29/19  Yes Bedsole, Amy E, MD  losartan (COZAAR) 50 MG tablet Take 1 tablet by mouth once daily 02/13/20  Yes Bedsole, Amy E, MD  megestrol (MEGACE) 40 MG tablet Take 1 tablet (40 mg total) by mouth 2 (two) times daily. 03/12/20  Yes Marny Lowenstein A, NP  methotrexate (RHEUMATREX) 2.5 MG tablet Take 8 tablets by mouth once a week. 09/05/15  Yes [provider]    Allergies    Latex and Sulfur  Review of Systems   Review of Systems  Constitutional: Negative for chills and fever.  HENT: Negative for congestion and rhinorrhea.   Eyes: Negative for redness and visual disturbance.  Respiratory: Negative for shortness of breath and wheezing.   Cardiovascular: Negative for chest pain and palpitations.  Gastrointestinal: Negative for nausea and vomiting.  Genitourinary: Positive for vaginal bleeding. Negative for dysuria and urgency.  Musculoskeletal: Negative for arthralgias and myalgias.  Skin: Negative for pallor and wound.  Neurological: Negative for dizziness and headaches.    Physical Exam Updated Vital Signs BP 138/85 (BP Location: Right Arm)   Pulse (!) 106   Temp 98.6 F (37 C) (Oral)   Resp 17   Ht 5\' 3"  (1.6 m)   Wt 93.4 kg   LMP 11/17/2012   SpO2 100%   BMI 36.49 kg/m   Physical Exam Vitals and nursing note reviewed.  Constitutional:      General: She is not in acute distress.    Appearance: She is well-developed. She is not diaphoretic.  HENT:     Head: Normocephalic and atraumatic.  Eyes:     Pupils: Pupils are equal, round, and reactive to light.  Cardiovascular:     Rate and  Rhythm: Normal rate and regular rhythm.     Heart sounds: No murmur. No friction rub. No gallop.   Pulmonary:     Effort: Pulmonary effort is normal.     Breath sounds: No wheezing or rales.  Abdominal:     General: There is no distension.  Palpations: Abdomen is soft.     Tenderness: There is no abdominal tenderness.  Genitourinary:    Comments: No appreciable cervix, replaced by irregular shaped mass.  No obvious area of hemorrhage.  The patient does have some clots in the vault. Musculoskeletal:        General: No tenderness.     Cervical back: Normal range of motion and neck supple.  Skin:    General: Skin is warm and dry.  Neurological:     Mental Status: She is alert and oriented to person, place, and time.  Psychiatric:        Behavior: Behavior normal.     ED Results / Procedures / Treatments   Labs (all labs ordered are listed, but only abnormal results are displayed) Labs Reviewed  CBC WITH DIFFERENTIAL/PLATELET - Abnormal; Notable for the following components:      Result Value   WBC 11.4 (*)    Hemoglobin 11.6 (*)    Platelets 137 (*)    All other components within normal limits  COMPREHENSIVE METABOLIC PANEL - Abnormal; Notable for the following components:   Potassium 3.3 (*)    Glucose, Bld 125 (*)    Total Protein 8.2 (*)    All other components within normal limits  I-STAT BETA HCG BLOOD, ED ([facility], [facility], [facility] ONLY)    EKG None  Radiology No results found.  Procedures Procedures (including critical care time)  Medications Ordered in ED Medications - No data to display  ED Course  I have reviewed the triage vital signs and the nursing notes.  Pertinent labs & imaging results that were available during my care of the patient were reviewed by me and considered in my medical decision making (see chart for details).    MDM Rules/Calculators/A&P                      59 yo F with a cc of vaginal bleeding.  Patient recently diagnosed with a likely  uterine mass.  Has been following up with OB/GYN.  Started having some bleeding.  No significant loss of blood based on recent lab work.  No significant amount of bleeding on my exam.  We will have her continue her hormonal therapy and follow-up with OB/GYN.  10:24 PM:  I have discussed the diagnosis/risks/treatment options with the patient and family and believe the pt to be eligible for discharge home to follow-up with GYN. We also discussed returning to the ED immediately if new or worsening sx occur. We discussed the sx which are most concerning (e.g., worsening bleeding syncope or near syncope) that necessitate immediate return. Medications administered to the patient during their visit and any new prescriptions provided to the patient are listed below.  Medications given during this visit Medications - No data to display   The patient appears reasonably screen and/or stabilized for discharge and I doubt any other medical condition or other [facility] requiring further screening, evaluation, or treatment in the ED at this time prior to discharge.   Final Clinical Impression(s) / ED Diagnoses Final diagnoses:  Vaginal bleeding  Pelvic mass    Rx / DC Orders ED Discharge Orders    None       Deno Etienne, DO 03/14/20 2224

## 2020-03-14 NOTE — ED Triage Notes (Signed)
Last night large amount of blood lost. Pt sts "a lot of pads can not count in 24 hours". Pt seen for [place] last week and told uterus was enlarged and it could potentially be cancerous. Pt fatigued and feels like she may pass out.

## 2020-03-16 ENCOUNTER — Telehealth: Payer: Self-pay | Admitting: *Deleted

## 2020-03-16 ENCOUNTER — Other Ambulatory Visit: Payer: Self-pay

## 2020-03-16 DIAGNOSIS — D649 Anemia, unspecified: Secondary | ICD-10-CM

## 2020-03-16 NOTE — Telephone Encounter (Signed)
Yes, advance Sonohysto to this week and add a CBC when she comes.  Will also do and Endometrial Biopsy after the Sonohysto.

## 2020-03-16 NOTE — Telephone Encounter (Signed)
Patient called c/o heavy vaginal bleeding was prescribed megace 40 mg tablet at OV with Tiffany on 03/13/20, scheduled for [facility] on 03/26/20.  Patient went to ER on 03/14/20 later that night due to heavy bleeding changing pads every 45 mins.. she spoke with Dr.Silva earlier on 03/14/20 and was told to increase megace 40 mg tablet to 80 mg tid ( patient is still taking this dose now) reports at 2am she woke up had to change pads (wears 2 long super pads) blood was in bed as well. This am she has been laying in bed and not moved, worried about the flow, still wearing 2 pads. Patient would like to know if [facility] can be advanced to sooner date this week?

## 2020-03-16 NOTE — Telephone Encounter (Signed)
Patient scheduled on 03/19/20 for [facility]

## 2020-03-16 NOTE — Telephone Encounter (Signed)
CBC ordered 

## 2020-03-18 ENCOUNTER — Other Ambulatory Visit: Payer: Self-pay

## 2020-03-19 ENCOUNTER — Ambulatory Visit (INDEPENDENT_AMBULATORY_CARE_PROVIDER_SITE_OTHER): Payer: PRIVATE HEALTH INSURANCE

## 2020-03-19 ENCOUNTER — Ambulatory Visit (INDEPENDENT_AMBULATORY_CARE_PROVIDER_SITE_OTHER): Payer: PRIVATE HEALTH INSURANCE | Admitting: Obstetrics & Gynecology

## 2020-03-19 ENCOUNTER — Encounter: Payer: Self-pay | Admitting: Obstetrics & Gynecology

## 2020-03-19 ENCOUNTER — Other Ambulatory Visit: Payer: Self-pay | Admitting: Nurse Practitioner

## 2020-03-19 DIAGNOSIS — D219 Benign neoplasm of connective and other soft tissue, unspecified: Secondary | ICD-10-CM

## 2020-03-19 DIAGNOSIS — N950 Postmenopausal bleeding: Secondary | ICD-10-CM | POA: Diagnosis not present

## 2020-03-19 DIAGNOSIS — D649 Anemia, unspecified: Secondary | ICD-10-CM | POA: Diagnosis not present

## 2020-03-19 DIAGNOSIS — D259 Leiomyoma of uterus, unspecified: Secondary | ICD-10-CM

## 2020-03-19 LAB — CBC
HCT: 27.9 % — ABNORMAL LOW (ref 35.0–45.0)
Hemoglobin: 9 g/dL — ABNORMAL LOW (ref 11.7–15.5)
MCH: 28 pg (ref 27.0–33.0)
MCHC: 32.3 g/dL (ref 32.0–36.0)
MCV: 86.6 fL (ref 80.0–100.0)
MPV: 13.7 fL — ABNORMAL HIGH (ref 7.5–12.5)
Platelets: 172 10*3/uL (ref 140–400)
RBC: 3.22 10*6/uL — ABNORMAL LOW (ref 3.80–5.10)
RDW: 14.2 % (ref 11.0–15.0)
WBC: 8.8 10*3/uL (ref 3.8–10.8)

## 2020-03-19 MED ORDER — FERROUS SULFATE 325 (65 FE) MG PO TABS: 325.0000 mg | ORAL_TABLET | Freq: Three times a day (TID) | ORAL | 1 refills | Status: DC

## 2020-03-19 NOTE — Progress Notes (Signed)
    [name] September 04, 1961 [id number]        59 y.o.  [id number]   RP: PMB for Sonohysterogram  HPI: PMB started on Megace.  Had increased pelvic pain/pressure the day after her office visit here on 03/13/20.  Presented to the ER and Megace was increased to 80 mg TID.  Having less bleeding now, but not stopped.   OB History  Gravida Para Term Preterm AB Living  3 2     1 2   SAB TAB Ectopic Multiple Live Births    1          # Outcome Date GA Lbr Len/2nd Weight Sex Delivery Anes PTL Lv  3 TAB           2 Para           1 Para             Past medical history,surgical history, problem list, medications, allergies, family history and social history were all reviewed and documented in the EPIC chart.   Directed ROS with pertinent positives and negatives documented in the history of present illness/assessment and plan.  Exam:  There were no vitals filed for this visit. General appearance:  Normal  Pelvic US today: T/V images.  Anteverted uterus measuring 12.13 x 5.41 x 6.46 cm.  2 small intramural calcified fibroids are present.  A large fibroid measuring 7 x 5 cm is present in the cervical canal protruding in the vagina with a large vascular connection between the mass and the uterine cavity.  Fluid in the endometrial cavity outlines thin symmetrical endometrial walls, the combined measurement is at 4.56 mm..  Both ovaries are small with atrophic appearance.  No adnexal mass.  No free fluid in the posterior cul-de-sac.  Gyn exam:  Prolapse Fibroid vaginally.  Mild bleeding currently.   Assessment/Plan:  59 y.o. [id number]   1. Postmenopausal bleeding Heavy postmenopausal bleeding secondary to a large fibroid prolapsed vaginally.  We will repeat a CBC today to evaluate the degree of anemia.  Continue with megestrol 80 mg 3 times a day.  If stops bleeding may decrease to 80 mg twice a day.  We will proceed with XI robotic total laparoscopic hysterectomy with bilateral  salpingo-oophorectomy.  Will probably need to excise the prolapsed fibroid vaginally at the time of the robotic hysterectomy, before docking the robot.  Information and pamphlet given to patient.  Will follow up with a preop visit.  We will schedule surgery as soon as possible.  Bleeding precautions thoroughly discussed with patient.  Start on ferrous sulfate 325 mg 1 tablet 3 times a day. - CBC - megestrol (MEGACE) 40 MG tablet; Take 2 tablets (80 mg total) by mouth 2 (two) times daily.  2. Fibroids As above.  3. Anemia, unspecified type We will recheck with a CBC today.  Start iron sulfate as above.  Other orders - ferrous sulfate 325 (65 FE) MG tablet; Take 1 tablet (325 mg total) by mouth 3 (three) times daily with meals. - megestrol (MEGACE) 40 MG tablet; Take 2 tablets (80 mg total) by mouth 2 (two) times daily.  Princess Bruins MD, 9:23 AM 03/19/2020

## 2020-03-20 ENCOUNTER — Encounter: Payer: Self-pay | Admitting: Obstetrics & Gynecology

## 2020-03-20 ENCOUNTER — Telehealth: Payer: Self-pay | Admitting: *Deleted

## 2020-03-20 MED ORDER — MEGESTROL ACETATE 40 MG PO TABS: 80.0000 mg | ORAL_TABLET | Freq: Two times a day (BID) | ORAL | 1 refills | Status: DC

## 2020-03-20 NOTE — Telephone Encounter (Addendum)
Patient was seen on 03/19/20 and thought a Rx for megace was going to the pharmacy, she did pickup the iron Rx. She reports she was told to stay on megace until surgery? Schedule on 04/07/20. Patient would like Rx sent to [facility] [address]. Please advise

## 2020-03-20 NOTE — Telephone Encounter (Signed)
Yes, sending prescription now.  Will decrease to 2 tab PO BID (rather than TID, but can decrease progressively over a week).

## 2020-03-20 NOTE — Patient Instructions (Signed)
1. Postmenopausal bleeding Heavy postmenopausal bleeding secondary to a large fibroid prolapsed vaginally.  We will repeat a CBC today to evaluate the degree of anemia.  Continue with megestrol 80 mg 3 times a day.  If stops bleeding may decrease to 80 mg twice a day.  We will proceed with XI robotic total laparoscopic hysterectomy with bilateral salpingo-oophorectomy.  Will probably need to excise the prolapsed fibroid vaginally at the time of the robotic hysterectomy, before docking the robot.  Information and pamphlet given to patient.  Will follow up with a preop visit.  We will schedule surgery as soon as possible.  Bleeding precautions thoroughly discussed with patient.  Start on ferrous sulfate 325 mg 1 tablet 3 times a day. - CBC - megestrol (MEGACE) 40 MG tablet; Take 2 tablets (80 mg total) by mouth 2 (two) times daily.  2. Fibroids As above.  3. Anemia, unspecified type We will recheck with a CBC today.  Start iron sulfate as above.  Other orders - ferrous sulfate 325 (65 FE) MG tablet; Take 1 tablet (325 mg total) by mouth 3 (three) times daily with meals. - megestrol (MEGACE) 40 MG tablet; Take 2 tablets (80 mg total) by mouth 2 (two) times daily.  Rachel Rivas, it was a pleasure meeting you today!  I will inform you of your results as soon as they are available.

## 2020-03-23 ENCOUNTER — Telehealth: Payer: Self-pay | Admitting: *Deleted

## 2020-03-23 NOTE — Telephone Encounter (Signed)
Patient called asking if a letter could be provided for her to continue working at home from 03/23/20-04/03/20. Patient is scheduled for surgery on 04/07/20 for robotic hysterectomy. 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 Patient would prefer to work from home because she doesn't want to take a chance on "catching" anything before her surgery.  Please advise

## 2020-03-24 ENCOUNTER — Telehealth: Payer: Self-pay | Admitting: Pulmonary Disease

## 2020-03-24 DIAGNOSIS — G4733 Obstructive sleep apnea (adult) (pediatric): Secondary | ICD-10-CM

## 2020-03-24 NOTE — Telephone Encounter (Signed)
HST showed mod OSA with AHI 24/ hr -worse than before Suggest autoCPAP  5-15 cm, nasal mask of choice OV with me/APP in 6 wks

## 2020-03-24 NOTE — Telephone Encounter (Signed)
Patient was aware Rx was sent to pharmacy.

## 2020-03-24 NOTE — Telephone Encounter (Signed)
Called and spoke with pt. Pt is requesting to know the results of HST which was performed. Dr. Elsworth Soho, please advise.

## 2020-03-25 NOTE — Patient Instructions (Addendum)
Get your Covid test at [facility] in [place] on 04/03/20 at [facility] procedure is scheduled on 04/07/20   Report to [place].M.   Call this number if you have problems the morning of [phone]   OUR ADDRESS IS [address], WE ARE LOCATED IN THE [facility]ICAL [facility] WITH [facility].   Remember:  Do not eat food or drink liquids after midnight.   Take these medicines the morning of surgery with A SIP OF WATER: Megace , Pepcid              Bring your C-PAP to the hospital with you.   Do not wear jewelry, make-up or nail polish.  Do not wear lotions, powders, or perfumes, or deoderant.  Do not shave 48 hours prior to surgery.    Do not bring valuables to the hospital.  [facility] is not responsible for any belongings or valuables.  Contacts, dentures or bridgework may not be worn into surgery.    For patients admitted to the hospital, discharge time will be determined by your treatment team.  Patients discharged the day of surgery will not be allowed to drive home.   Special instructions:  Bring your prescription meds in their original bottles  Please read over the following fact sheets that you were given:  [facility] - Preparing for Surgery Before surgery, you can play an important role.  Because skin is not sterile, your skin needs to be as free of germs as possible.  You can reduce the number of germs on your skin by washing with CHG (chlorahexidine gluconate) soap before surgery.  CHG is an antiseptic cleaner which kills germs and bonds with the skin to continue killing germs even after washing. Please DO NOT use if you have an allergy to CHG or antibacterial soaps.  If your skin becomes reddened/irritated stop using the CHG and inform your nurse when you arrive at Short Stay. Do not shave (including legs and underarms) for at least 48 hours prior to the first CHG shower.  You may shave  your face/neck. Please follow these instructions carefully:  1.  Shower with CHG Soap the night before surgery and the  morning of Surgery.  2.  If you choose to wash your hair, wash your hair first as usual with your  normal  shampoo.  3.  After you shampoo, rinse your hair and body thoroughly to remove the  shampoo.                                        4.  Use CHG as you would any other liquid soap.  You can apply chg directly  to the skin and wash                       Gently with a scrungie or clean washcloth.  5.  Apply the CHG Soap to your body ONLY FROM THE NECK DOWN.   Do not use on face/ open                           Wound or open sores. Avoid contact with eyes, ears mouth and genitals (private parts).  Wash face,  Genitals (private parts) with your normal soap.             6.  Wash thoroughly, paying special attention to the area where your surgery  will be performed.  7.  Thoroughly rinse your body with warm water from the neck down.  8.  DO NOT shower/wash with your normal soap after using and rinsing off  the CHG Soap.             9.  Pat yourself dry with a clean towel.            10.  Wear clean pajamas.            11.  Place clean sheets on your bed the night of your first shower and do not  sleep with pets. Day of Surgery : Do not apply any lotions/deodorants the morning of surgery.  Please wear clean clothes to the hospital/surgery center.  FAILURE TO FOLLOW THESE INSTRUCTIONS MAY RESULT IN THE CANCELLATION OF YOUR SURGERY PATIENT SIGNATURE_________________________________  NURSE SIGNATURE__________________________________  ________________________________________________________________________

## 2020-03-25 NOTE — Telephone Encounter (Signed)
Spoke with pt about sleep study results. Patient agreed to start CPAP therapy. The order for CPAP was sent. Nothing further is needed.

## 2020-03-26 ENCOUNTER — Other Ambulatory Visit: Payer: Self-pay

## 2020-03-26 ENCOUNTER — Ambulatory Visit: Payer: PRIVATE HEALTH INSURANCE | Admitting: Obstetrics & Gynecology

## 2020-03-26 ENCOUNTER — Encounter (HOSPITAL_COMMUNITY)
Admission: RE | Admit: 2020-03-26 | Discharge: 2020-03-26 | Disposition: A | Discharge status: Home / Self Care | Payer: PRIVATE HEALTH INSURANCE | Source: Ambulatory Visit | Attending: Obstetrics & Gynecology | Admitting: Obstetrics & Gynecology

## 2020-03-26 ENCOUNTER — Encounter (HOSPITAL_COMMUNITY): Payer: Self-pay

## 2020-03-26 ENCOUNTER — Other Ambulatory Visit: Payer: PRIVATE HEALTH INSURANCE

## 2020-03-26 DIAGNOSIS — Z01812 Encounter for preprocedural laboratory examination: Secondary | ICD-10-CM | POA: Insufficient documentation

## 2020-03-26 DIAGNOSIS — N950 Postmenopausal bleeding: Secondary | ICD-10-CM | POA: Insufficient documentation

## 2020-03-26 HISTORY — DX: Dyspnea, unspecified: R06.00

## 2020-03-26 LAB — BASIC METABOLIC PANEL
Anion gap: 10 (ref 5–15)
BUN: 23 mg/dL — ABNORMAL HIGH (ref 6–20)
CO2: 26 mmol/L (ref 22–32)
Calcium: 9.1 mg/dL (ref 8.9–10.3)
Chloride: 103 mmol/L (ref 98–111)
Creatinine, Ser: 0.96 mg/dL (ref 0.44–1.00)
GFR calc Af Amer: >60 mL/min (ref >60)
GFR calc non Af Amer: >60 mL/min (ref >60)
Glucose, Bld: 92 mg/dL (ref 70–99)
Potassium: 3.9 mmol/L (ref 3.5–5.1)
Sodium: 139 mmol/L (ref 135–145)

## 2020-03-26 LAB — TYPE AND SCREEN
ABO/RH(D): B POS
Antibody Screen: NEGATIVE

## 2020-03-26 LAB — CBC
HCT: 30.4 % — ABNORMAL LOW (ref 36.0–46.0)
Hemoglobin: 9.5 g/dL — ABNORMAL LOW (ref 12.0–15.0)
MCH: 28.1 pg (ref 26.0–34.0)
MCHC: 31.3 g/dL (ref 30.0–36.0)
MCV: 89.9 fL (ref 80.0–100.0)
Platelets: 318 10*3/uL (ref 150–400)
RBC: 3.38 MIL/uL — ABNORMAL LOW (ref 3.87–5.11)
RDW: 15.9 % — ABNORMAL HIGH (ref 11.5–15.5)
WBC: 9.5 10*3/uL (ref 4.0–10.5)
nRBC: 0 % (ref 0.0–0.2)

## 2020-03-26 LAB — ABO/RH: ABO/RH(D): B POS

## 2020-03-26 NOTE — Assessment & Plan Note (Signed)
Eval with cbc for hemoglobin as well as for worsening platelet levels that could be contributing to bleeding.

## 2020-03-26 NOTE — Assessment & Plan Note (Signed)
Higher risk for endometrial cancer as this is type her mother passed away from.

## 2020-03-26 NOTE — Progress Notes (Signed)
PCP - Dr. Eliezer Lofts Cardiologist - Dr B. Christopher  Chest x-ray - no EKG - 11/05/19 Stress Test - no ECHO - 11/05/19 Cardiac Cath - no  Sleep Study -03/10/19 pt had a sleep study. She has not been fitted for a C-Pap yet.  CPAP - no  Fasting Blood Sugar - NA Checks Blood Sugar _____ times a day  Blood Thinner Instructions:NA Aspirin Instructions: Last Dose:  Anesthesia review:   Patient denies shortness of breath, fever, cough and chest pain at PAT appointment yes  Patient verbalized understanding of instructions that were given to them at the PAT appointment. Patient was also instructed that they will need to review over the PAT instructions again at home before surgery. Yes  Pt has recently been diagnosted with RA. I told her to call her MD and ask about her methotrexate.  She reports that she has sensitive skin and doesn't want the risk using Hibiclens. She will shower with Dial soap.

## 2020-03-26 NOTE — Assessment & Plan Note (Signed)
Eval with [place] followed by referral to [facility] for endometrial biopsy.

## 2020-03-26 NOTE — Telephone Encounter (Signed)
Juliann Pulse took care of this.

## 2020-03-27 ENCOUNTER — Other Ambulatory Visit: Payer: Self-pay | Admitting: Family Medicine

## 2020-03-30 ENCOUNTER — Telehealth: Payer: Self-pay

## 2020-03-30 ENCOUNTER — Telehealth: Payer: Self-pay | Admitting: Pulmonary Disease

## 2020-03-30 NOTE — Telephone Encounter (Signed)
I had left message Friday afternoon with precert line for ins co. Nurse called back this morning needing more information. I provided her everything she requested. She said she will get back with me regarding authorization.

## 2020-03-30 NOTE — Telephone Encounter (Signed)
Spoke with pt and advised that [facility] our [facility] spoke with Estill Bamberg with [employer] today and since pt was never fully set up with [facility] in 2016 then she will work on getting pt started through [facility]. PT verbalized understanding. Nothing further needed.

## 2020-04-02 ENCOUNTER — Telehealth (INDEPENDENT_AMBULATORY_CARE_PROVIDER_SITE_OTHER): Payer: PRIVATE HEALTH INSURANCE | Admitting: Obstetrics & Gynecology

## 2020-04-02 DIAGNOSIS — D219 Benign neoplasm of connective and other soft tissue, unspecified: Secondary | ICD-10-CM

## 2020-04-02 DIAGNOSIS — N950 Postmenopausal bleeding: Secondary | ICD-10-CM

## 2020-04-02 NOTE — Progress Notes (Signed)
Rachel Rivas 09/05/1961 [id number]        58 y.o.  [id number]  Verbal consent for Televisit obtained.  Patient well identified.  Counseling on Surgery for 15 minutes.  RP: Preop Vaginal Mymectomy/XI Robotic TLH/BSO  HPI: PMB started on Megace.  Had increased pelvic pain/pressure the day after her office visit here on 03/13/20.  Presented to the ER and Megace was increased to 80 mg TID.  Now back to Megace 40 mg BID with no bleeding and much decreased pelvic/vaginal pain.   OB History  Gravida Para Term Preterm AB Living  3 2     1 2   SAB TAB Ectopic Multiple Live Births    1          # Outcome Date GA Lbr Len/2nd Weight Sex Delivery Anes PTL Lv  3 TAB           2 Para           1 Para             Past medical history,surgical history, problem list, medications, allergies, family history and social history were all reviewed and documented in the EPIC chart.   Directed ROS with pertinent positives and negatives documented in the history of present illness/assessment and plan.  Exam:  There were no vitals filed for this visit. General appearance:  Normal  Televisit  Pelvic US today: T/V images.  Anteverted uterus measuring 12.13 x 5.41 x 6.46 cm.  2 small intramural calcified fibroids are present.  A large fibroid measuring 7 x 5 cm is present in the cervical canal protruding in the vagina with a large vascular connection between the mass and the uterine cavity.  Fluid in the endometrial cavity outlines thin symmetrical endometrial walls, the combined measurement is at 4.56 mm..  Both ovaries are small with atrophic appearance.  No adnexal mass.  No free fluid in the posterior cul-de-sac.  Gyn exam:  Prolapse Fibroid vaginally.  Mild bleeding currently.  CBC Hb 9.5 on 03/26/2020    Assessment/Plan:  59 y.o. [id number]   1. Postmenopausal bleeding Heavy postmenopausal bleeding secondary to a large fibroid prolapsed vaginally.  Hb 9.5 on 5/6th.  Continue with megestrol 40 mg  BID and FeSO4 until surgery.  Decision to proceed with XI robotic total laparoscopic hysterectomy with bilateral salpingo-oophorectomy.  Excision of the prolapsed fibroid vaginally at the time of the robotic hysterectomy, before docking the robot.  Information and pamphlet given to patient.  Surgery thoroughly reviewed including preop preparation, surgical procedure with risks/benefits and postop precautions and expectations.  2. Fibroids As above.  3. Anemia, unspecified type Hb 9.5 on 03/26/2020.  Megace and FeSO4 until surgery.  Other orders - ferrous sulfate 325 (65 FE) MG tablet; Take 1 tablet (325 mg total) by mouth 3 (three) times daily with meals. - megestrol (MEGACE) 40 MG tablet; Take 1 tab BID until surgery                        Patient was counseled as to the risk of surgery to include the following:  1. Infection (prohylactic antibiotics will be administered)  2. DVT/Pulmonary Embolism (prophylactic pneumo compression stockings will be used)  3.Trauma to internal organs requiring additional surgical procedure to repair any injury to internal organs requiring perhaps additional hospitalization days.  4.Hemmorhage requiring transfusion and blood products which carry risks such as anaphylactic reaction, hepatitis and AIDS  Patient had received literature information on the  procedure scheduled and all her questions were answered and fully accepts all risk.   Princess Bruins MD, 2:07 PM 04/02/2020

## 2020-04-03 ENCOUNTER — Other Ambulatory Visit (HOSPITAL_COMMUNITY)
Admission: RE | Admit: 2020-04-03 | Discharge: 2020-04-03 | Disposition: A | Discharge status: Home / Self Care | Payer: PRIVATE HEALTH INSURANCE | Source: Ambulatory Visit | Attending: Obstetrics & Gynecology | Admitting: Obstetrics & Gynecology

## 2020-04-03 ENCOUNTER — Encounter: Payer: Self-pay | Admitting: Anesthesiology

## 2020-04-03 DIAGNOSIS — Z01812 Encounter for preprocedural laboratory examination: Secondary | ICD-10-CM | POA: Insufficient documentation

## 2020-04-03 DIAGNOSIS — Z20822 Contact with and (suspected) exposure to covid-19: Secondary | ICD-10-CM | POA: Diagnosis not present

## 2020-04-03 LAB — SARS CORONAVIRUS 2 (TAT 6-24 HRS): SARS Coronavirus 2: NEGATIVE

## 2020-04-04 ENCOUNTER — Encounter: Payer: Self-pay | Admitting: Obstetrics & Gynecology

## 2020-04-04 NOTE — Patient Instructions (Signed)
1. Postmenopausal bleeding Heavy postmenopausal bleeding secondary to a large fibroid prolapsed vaginally.  Hb 9.5 on 5/6th.  Continue with megestrol 40 mg BID and FeSO4 until surgery.  Decision to proceed with XI robotic total laparoscopic hysterectomy with bilateral salpingo-oophorectomy.  Excision of the prolapsed fibroid vaginally at the time of the robotic hysterectomy, before docking the robot.  Information and pamphlet given to patient.  Surgery thoroughly reviewed including preop preparation, surgical procedure with risks/benefits and postop precautions and expectations.  2. Fibroids As above.  3. Anemia, unspecified type Hb 9.5 on 03/26/2020.  Megace and FeSO4 until surgery.  Other orders - ferrous sulfate 325 (65 FE) MG tablet; Take 1 tablet (325 mg total) by mouth 3 (three) times daily with meals. - megestrol (MEGACE) 40 MG tablet; Take 1 tab BID until surgery                        Patient was counseled as to the risk of surgery to include the following:  1. Infection (prohylactic antibiotics will be administered)  2. DVT/Pulmonary Embolism (prophylactic pneumo compression stockings will be used)  3.Trauma to internal organs requiring additional surgical procedure to repair any injury to internal organs requiring perhaps additional hospitalization days.  4.Hemmorhage requiring transfusion and blood products which carry risks such as anaphylactic reaction, hepatitis and AIDS  Patient had received literature information on the procedure scheduled and all her questions were answered and fully accepts all risk.

## 2020-04-06 ENCOUNTER — Encounter (HOSPITAL_BASED_OUTPATIENT_CLINIC_OR_DEPARTMENT_OTHER): Payer: Self-pay | Admitting: Obstetrics & Gynecology

## 2020-04-06 NOTE — Anesthesia Preprocedure Evaluation (Addendum)
Anesthesia Evaluation  Patient identified by MRN, date of birth, ID band Patient awake    Reviewed: Allergy & Precautions, NPO status , Patient's Chart, lab work & pertinent test results  Airway Mallampati: II  TM Distance: >3 FB Neck ROM: Full    Dental no notable dental hx. (+) Teeth Intact   Pulmonary shortness of breath and with exertion, sleep apnea and Continuous Positive Airway Pressure Ventilation , former smoker,    Pulmonary exam normal breath sounds clear to auscultation       Cardiovascular hypertension, Pt. on medications Normal cardiovascular exam Rhythm:Regular Rate:Normal     Neuro/Psych negative neurological ROS  negative psych ROS   GI/Hepatic Neg liver ROS, GERD  Medicated and Controlled,  Endo/Other  Obesity  Renal/GU negative Renal ROS  negative genitourinary   Musculoskeletal  (+) Arthritis , Rheumatoid disorders,    Abdominal (+) + obese,   Peds  Hematology  (+) anemia ,   Anesthesia Other Findings   Reproductive/Obstetrics Post menopausal bleeding Menometorrhagia Prolapsed vaginal fibroid Fibroid uterus                            Anesthesia Physical Anesthesia Plan  ASA: III  Anesthesia Plan: General   Post-op Pain Management:    Induction: Intravenous and Cricoid pressure planned  PONV Risk Score and Plan: 4 or greater and Scopolamine patch - Pre-op, Midazolam, Ondansetron, Dexamethasone and Treatment may vary due to age or medical condition  Airway Management Planned: Oral ETT  Additional Equipment:   Intra-op Plan:   Post-operative Plan: Extubation in OR  Informed Consent: I have reviewed the patients History and Physical, chart, labs and discussed the procedure including the risks, benefits and alternatives for the proposed anesthesia with the patient or authorized representative who has indicated his/her understanding and acceptance.      Dental advisory given  Plan Discussed with: CRNA and Surgeon  Anesthesia Plan Comments:        Anesthesia Quick Evaluation

## 2020-04-07 ENCOUNTER — Encounter (HOSPITAL_BASED_OUTPATIENT_CLINIC_OR_DEPARTMENT_OTHER): Payer: Self-pay | Admitting: Obstetrics & Gynecology

## 2020-04-07 ENCOUNTER — Ambulatory Visit (HOSPITAL_BASED_OUTPATIENT_CLINIC_OR_DEPARTMENT_OTHER)
Admission: RE | Admit: 2020-04-07 | Discharge: 2020-04-07 | Disposition: A | Discharge status: Home / Self Care | Payer: PRIVATE HEALTH INSURANCE | Source: Ambulatory Visit | Attending: Obstetrics & Gynecology | Admitting: Obstetrics & Gynecology

## 2020-04-07 ENCOUNTER — Encounter (HOSPITAL_BASED_OUTPATIENT_CLINIC_OR_DEPARTMENT_OTHER)
Admission: RE | Disposition: A | Discharge status: Home / Self Care | Payer: Self-pay | Source: Ambulatory Visit | Attending: Obstetrics & Gynecology

## 2020-04-07 ENCOUNTER — Other Ambulatory Visit: Payer: Self-pay

## 2020-04-07 ENCOUNTER — Ambulatory Visit (HOSPITAL_BASED_OUTPATIENT_CLINIC_OR_DEPARTMENT_OTHER): Payer: PRIVATE HEALTH INSURANCE | Admitting: Anesthesiology

## 2020-04-07 DIAGNOSIS — D259 Leiomyoma of uterus, unspecified: Secondary | ICD-10-CM | POA: Diagnosis not present

## 2020-04-07 DIAGNOSIS — D649 Anemia, unspecified: Secondary | ICD-10-CM | POA: Insufficient documentation

## 2020-04-07 DIAGNOSIS — N921 Excessive and frequent menstruation with irregular cycle: Secondary | ICD-10-CM | POA: Diagnosis not present

## 2020-04-07 DIAGNOSIS — Z87891 Personal history of nicotine dependence: Secondary | ICD-10-CM | POA: Insufficient documentation

## 2020-04-07 DIAGNOSIS — C541 Malignant neoplasm of endometrium: Secondary | ICD-10-CM

## 2020-04-07 DIAGNOSIS — I10 Essential (primary) hypertension: Secondary | ICD-10-CM | POA: Insufficient documentation

## 2020-04-07 DIAGNOSIS — Z79899 Other long term (current) drug therapy: Secondary | ICD-10-CM | POA: Insufficient documentation

## 2020-04-07 DIAGNOSIS — K660 Peritoneal adhesions (postprocedural) (postinfection): Secondary | ICD-10-CM | POA: Insufficient documentation

## 2020-04-07 DIAGNOSIS — K219 Gastro-esophageal reflux disease without esophagitis: Secondary | ICD-10-CM | POA: Insufficient documentation

## 2020-04-07 DIAGNOSIS — Z9889 Other specified postprocedural states: Secondary | ICD-10-CM

## 2020-04-07 HISTORY — PX: ROBOTIC ASSISTED TOTAL HYSTERECTOMY WITH BILATERAL SALPINGO OOPHERECTOMY: SHX6086

## 2020-04-07 HISTORY — PX: MYOMECTOMY: SHX85

## 2020-04-07 LAB — CBC
HCT: 30 % — ABNORMAL LOW (ref 36.0–46.0)
Hemoglobin: 9.4 g/dL — ABNORMAL LOW (ref 12.0–15.0)
MCH: 28.3 pg (ref 26.0–34.0)
MCHC: 31.3 g/dL (ref 30.0–36.0)
MCV: 90.4 fL (ref 80.0–100.0)
Platelets: 124 10*3/uL — ABNORMAL LOW (ref 150–400)
RBC: 3.32 MIL/uL — ABNORMAL LOW (ref 3.87–5.11)
RDW: 15.8 % — ABNORMAL HIGH (ref 11.5–15.5)
WBC: 15.4 10*3/uL — ABNORMAL HIGH (ref 4.0–10.5)
nRBC: 0 % (ref 0.0–0.2)

## 2020-04-07 SURGERY — HYSTERECTOMY, TOTAL, ROBOT-ASSISTED, LAPAROSCOPIC, WITH BILATERAL SALPINGO-OOPHORECTOMY
Anesthesia: General | Site: Vagina

## 2020-04-07 MED ORDER — WHITE PETROLATUM EX OINT
TOPICAL_OINTMENT | CUTANEOUS | Status: AC
  Filled 2020-04-07: qty 5

## 2020-04-07 MED ORDER — OXYCODONE HCL 5 MG/5ML PO SOLN: 5.0000 mg | Freq: Once | ORAL | Status: DC | PRN

## 2020-04-07 MED ORDER — ONDANSETRON HCL 4 MG/2ML IJ SOLN
INTRAMUSCULAR | Status: DC | PRN
  Administered 2020-04-07: 4 mg via INTRAVENOUS

## 2020-04-07 MED ORDER — CEFAZOLIN SODIUM-DEXTROSE 2-4 GM/100ML-% IV SOLN
2.0000 g | INTRAVENOUS | Status: AC
  Administered 2020-04-07: 2 g via INTRAVENOUS

## 2020-04-07 MED ORDER — LIDOCAINE 2% (20 MG/ML) 5 ML SYRINGE
INTRAMUSCULAR | Status: DC | PRN
  Administered 2020-04-07: 1.5 mg/kg/h via INTRAVENOUS

## 2020-04-07 MED ORDER — OXYCODONE HCL 5 MG PO TABS: 5.0000 mg | ORAL_TABLET | Freq: Once | ORAL | Status: DC | PRN

## 2020-04-07 MED ORDER — ONDANSETRON HCL 4 MG/2ML IJ SOLN: 4.0000 mg | Freq: Once | INTRAMUSCULAR | Status: DC | PRN

## 2020-04-07 MED ORDER — HYDROCODONE-ACETAMINOPHEN 5-325 MG PO TABS
ORAL_TABLET | ORAL | Status: AC
  Filled 2020-04-07: qty 1

## 2020-04-07 MED ORDER — LACTATED RINGERS IV SOLN: INTRAVENOUS | Status: DC

## 2020-04-07 MED ORDER — OXYCODONE-ACETAMINOPHEN 5-325 MG PO TABS: 1.0000 | ORAL_TABLET | ORAL | Status: DC | PRN

## 2020-04-07 MED ORDER — SCOPOLAMINE 1 MG/3DAYS TD PT72
MEDICATED_PATCH | TRANSDERMAL | Status: AC
  Filled 2020-04-07: qty 1

## 2020-04-07 MED ORDER — DEXAMETHASONE SODIUM PHOSPHATE 10 MG/ML IJ SOLN
INTRAMUSCULAR | Status: AC
  Filled 2020-04-07: qty 1

## 2020-04-07 MED ORDER — LIDOCAINE 2% (20 MG/ML) 5 ML SYRINGE
INTRAMUSCULAR | Status: AC
  Filled 2020-04-07: qty 5

## 2020-04-07 MED ORDER — FENTANYL CITRATE (PF) 100 MCG/2ML IJ SOLN: 25.0000 ug | INTRAMUSCULAR | Status: DC | PRN

## 2020-04-07 MED ORDER — FENTANYL CITRATE (PF) 100 MCG/2ML IJ SOLN
INTRAMUSCULAR | Status: DC | PRN
  Administered 2020-04-07 (×3): 50 ug via INTRAVENOUS

## 2020-04-07 MED ORDER — ROCURONIUM BROMIDE 10 MG/ML (PF) SYRINGE
PREFILLED_SYRINGE | INTRAVENOUS | Status: AC
  Filled 2020-04-07: qty 10

## 2020-04-07 MED ORDER — FENTANYL CITRATE (PF) 100 MCG/2ML IJ SOLN
INTRAMUSCULAR | Status: AC
  Filled 2020-04-07: qty 2

## 2020-04-07 MED ORDER — IBUPROFEN 800 MG PO TABS: 800.0000 mg | ORAL_TABLET | Freq: Three times a day (TID) | ORAL | 3 refills | Status: DC | PRN

## 2020-04-07 MED ORDER — KETOROLAC TROMETHAMINE 30 MG/ML IJ SOLN
INTRAMUSCULAR | Status: AC
  Filled 2020-04-07: qty 1

## 2020-04-07 MED ORDER — DEXMEDETOMIDINE HCL IN NACL 200 MCG/50ML IV SOLN
INTRAVENOUS | Status: DC | PRN
Start: 2020-04-07 — End: 2020-04-07
  Administered 2020-04-07 (×3): 4 ug via INTRAVENOUS
  Administered 2020-04-07: 8 ug via INTRAVENOUS
  Administered 2020-04-07: 4 ug via INTRAVENOUS

## 2020-04-07 MED ORDER — KETOROLAC TROMETHAMINE 30 MG/ML IJ SOLN
INTRAMUSCULAR | Status: DC | PRN
Start: 2020-04-07 — End: 2020-04-07
  Administered 2020-04-07: 30 mg via INTRAVENOUS

## 2020-04-07 MED ORDER — MIDAZOLAM HCL 2 MG/2ML IJ SOLN
INTRAMUSCULAR | Status: AC
  Filled 2020-04-07: qty 2

## 2020-04-07 MED ORDER — SUGAMMADEX SODIUM 200 MG/2ML IV SOLN
INTRAVENOUS | Status: DC | PRN
  Administered 2020-04-07: 200 mg via INTRAVENOUS

## 2020-04-07 MED ORDER — MEPERIDINE HCL 25 MG/ML IJ SOLN: 6.2500 mg | INTRAMUSCULAR | Status: DC | PRN

## 2020-04-07 MED ORDER — BUPIVACAINE HCL (PF) 0.25 % IJ SOLN
INTRAMUSCULAR | Status: DC | PRN
  Administered 2020-04-07: 15 mL

## 2020-04-07 MED ORDER — HYDROCODONE-ACETAMINOPHEN 5-325 MG PO TABS
1.0000 | ORAL_TABLET | ORAL | Status: DC | PRN
  Administered 2020-04-07 (×2): 1 via ORAL

## 2020-04-07 MED ORDER — DEXMEDETOMIDINE HCL IN NACL 200 MCG/50ML IV SOLN
INTRAVENOUS | Status: AC
  Filled 2020-04-07: qty 50

## 2020-04-07 MED ORDER — PROPOFOL 10 MG/ML IV BOLUS
INTRAVENOUS | Status: DC | PRN
  Administered 2020-04-07: 180 mg via INTRAVENOUS

## 2020-04-07 MED ORDER — DEXAMETHASONE SODIUM PHOSPHATE 10 MG/ML IJ SOLN
INTRAMUSCULAR | Status: DC | PRN
  Administered 2020-04-07 (×2): 5 mg via INTRAVENOUS

## 2020-04-07 MED ORDER — SCOPOLAMINE 1 MG/3DAYS TD PT72
1.0000 | MEDICATED_PATCH | TRANSDERMAL | Status: DC
  Administered 2020-04-07: 1.5 mg via TRANSDERMAL

## 2020-04-07 MED ORDER — ACETAMINOPHEN 500 MG PO TABS
1000.0000 mg | ORAL_TABLET | ORAL | Status: AC
  Administered 2020-04-07: 1000 mg via ORAL

## 2020-04-07 MED ORDER — OXYCODONE-ACETAMINOPHEN 7.5-325 MG PO TABS: 1.0000 | ORAL_TABLET | Freq: Four times a day (QID) | ORAL | 0 refills | Status: DC | PRN

## 2020-04-07 MED ORDER — ROCURONIUM BROMIDE 10 MG/ML (PF) SYRINGE
PREFILLED_SYRINGE | INTRAVENOUS | Status: DC | PRN
  Administered 2020-04-07: 10 mg via INTRAVENOUS
  Administered 2020-04-07: 70 mg via INTRAVENOUS

## 2020-04-07 MED ORDER — ACETAMINOPHEN 325 MG PO TABS: 650.0000 mg | ORAL_TABLET | ORAL | Status: DC | PRN

## 2020-04-07 MED ORDER — PROPOFOL 10 MG/ML IV BOLUS
INTRAVENOUS | Status: AC
  Filled 2020-04-07: qty 40

## 2020-04-07 MED ORDER — CEFAZOLIN SODIUM-DEXTROSE 2-4 GM/100ML-% IV SOLN
INTRAVENOUS | Status: AC
  Filled 2020-04-07: qty 100

## 2020-04-07 MED ORDER — ONDANSETRON HCL 4 MG/2ML IJ SOLN
INTRAMUSCULAR | Status: AC
  Filled 2020-04-07: qty 2

## 2020-04-07 MED ORDER — ACETAMINOPHEN 500 MG PO TABS
ORAL_TABLET | ORAL | Status: AC
  Filled 2020-04-07: qty 2

## 2020-04-07 MED ORDER — MIDAZOLAM HCL 2 MG/2ML IJ SOLN
INTRAMUSCULAR | Status: DC | PRN
  Administered 2020-04-07: 2 mg via INTRAVENOUS

## 2020-04-07 MED ORDER — LIDOCAINE 2% (20 MG/ML) 5 ML SYRINGE
INTRAMUSCULAR | Status: DC | PRN
  Administered 2020-04-07: 80 mg via INTRAVENOUS

## 2020-04-07 SURGICAL SUPPLY — 57 items
CATH SILICONE 16FRX5CC (CATHETERS) ×3 IMPLANT
COVER BACK TABLE 60X90IN (DRAPES) ×3 IMPLANT
COVER TIP SHEARS 8 DVNC (MISCELLANEOUS) ×2 IMPLANT
COVER TIP SHEARS 8MM DA VINCI (MISCELLANEOUS) ×1
COVER WAND RF STERILE (DRAPES) ×3 IMPLANT
DEFOGGER SCOPE WARMER CLEARIFY (MISCELLANEOUS) ×3 IMPLANT
DERMABOND ADVANCED (GAUZE/BANDAGES/DRESSINGS) ×1
DERMABOND ADVANCED .7 DNX12 (GAUZE/BANDAGES/DRESSINGS) ×2 IMPLANT
DRAPE ARM DVNC X/XI (DISPOSABLE) ×8 IMPLANT
DRAPE COLUMN DVNC XI (DISPOSABLE) ×2 IMPLANT
DRAPE DA VINCI XI ARM (DISPOSABLE) ×4
DRAPE DA VINCI XI COLUMN (DISPOSABLE) ×1
DURAPREP 26ML APPLICATOR (WOUND CARE) ×3 IMPLANT
ELECT REM PT RETURN 9FT ADLT (ELECTROSURGICAL) ×3
ELECTRODE REM PT RTRN 9FT ADLT (ELECTROSURGICAL) ×2 IMPLANT
GAUZE PETROLATUM 1 X8 (GAUZE/BANDAGES/DRESSINGS) ×3 IMPLANT
GLOVE BIOGEL PI IND STRL 7.0 (GLOVE) ×10 IMPLANT
GLOVE BIOGEL PI IND STRL 7.5 (GLOVE) ×6 IMPLANT
GLOVE BIOGEL PI IND STRL 8 (GLOVE) ×4 IMPLANT
GLOVE BIOGEL PI INDICATOR 7.0 (GLOVE) ×5
GLOVE BIOGEL PI INDICATOR 7.5 (GLOVE) ×3
GLOVE BIOGEL PI INDICATOR 8 (GLOVE) ×2
GLOVE SURG SS PI 6.5 STRL IVOR (GLOVE) ×9 IMPLANT
GLOVE SURG SS PI 7.0 STRL IVOR (GLOVE) ×12 IMPLANT
GLOVE SURG SS PI 8.0 STRL IVOR (GLOVE) ×6 IMPLANT
GOWN STRL REUS W/ TWL XL LVL3 (GOWN DISPOSABLE) ×4 IMPLANT
GOWN STRL REUS W/TWL XL LVL3 (GOWN DISPOSABLE) ×2
HOLDER FOLEY CATH W/STRAP (MISCELLANEOUS) ×3 IMPLANT
IRRIG SUCT STRYKERFLOW 2 WTIP (MISCELLANEOUS) ×3
IRRIGATION SUCT STRKRFLW 2 WTP (MISCELLANEOUS) ×2 IMPLANT
LEGGING LITHOTOMY PAIR STRL (DRAPES) ×3 IMPLANT
MANIFOLD NEPTUNE II (INSTRUMENTS) ×3 IMPLANT
OBTURATOR OPTICAL STANDARD 8MM (TROCAR) ×1
OBTURATOR OPTICAL STND 8 DVNC (TROCAR) ×2
OBTURATOR OPTICALSTD 8 DVNC (TROCAR) ×2 IMPLANT
OCCLUDER COLPOPNEUMO (BALLOONS) ×3 IMPLANT
PACK ROBOT WH (CUSTOM PROCEDURE TRAY) ×3 IMPLANT
PACK ROBOTIC GOWN (GOWN DISPOSABLE) ×3 IMPLANT
PACK TRENDGUARD 450 HYBRID PRO (MISCELLANEOUS) ×2 IMPLANT
PAD PREP 24X48 CUFFED NSTRL (MISCELLANEOUS) ×3 IMPLANT
PROTECTOR NERVE ULNAR (MISCELLANEOUS) ×6 IMPLANT
SEAL CANN UNIV 5-8 DVNC XI (MISCELLANEOUS) ×8 IMPLANT
SEAL XI 5MM-8MM UNIVERSAL (MISCELLANEOUS) ×4
SET TRI-LUMEN FLTR TB AIRSEAL (TUBING) ×3 IMPLANT
SUT VIC AB 0 CT1 27 (SUTURE) ×1
SUT VIC AB 0 CT1 27XBRD ANBCTR (SUTURE) ×2 IMPLANT
SUT VIC AB 4-0 PS2 27 (SUTURE) ×12 IMPLANT
SUT VICRYL 0 ENDOLOOP (SUTURE) ×3 IMPLANT
SUT VICRYL 0 UR6 27IN ABS (SUTURE) ×3 IMPLANT
SUT VLOC 180 0 9IN  GS21 (SUTURE) ×1
SUT VLOC 180 0 9IN GS21 (SUTURE) ×2 IMPLANT
TIP UTERINE 6.7X10CM GRN DISP (MISCELLANEOUS) ×3 IMPLANT
TIP UTERINE 6.7X8CM BLUE DISP (MISCELLANEOUS) ×3 IMPLANT
TOWEL OR 17X26 10 PK STRL BLUE (TOWEL DISPOSABLE) ×3 IMPLANT
TRENDGUARD 450 HYBRID PRO PACK (MISCELLANEOUS) ×3
TROCAR PORT AIRSEAL 5X120 (TROCAR) ×3 IMPLANT
WATER STERILE IRR 1000ML POUR (IV SOLUTION) ×3 IMPLANT

## 2020-04-07 NOTE — Discharge Instructions (Signed)

## 2020-04-07 NOTE — Anesthesia Procedure Notes (Signed)
Procedure Name: Intubation Date/Time: 04/07/2020 8:11 AM Performed by: Suan Halter, CRNA Pre-anesthesia Checklist: Patient identified, Emergency Drugs available, Suction available and Patient being monitored Patient Re-evaluated:Patient Re-evaluated prior to induction Oxygen Delivery Method: Circle system utilized Preoxygenation: Pre-oxygenation with 100% oxygen Induction Type: IV induction Ventilation: Mask ventilation without difficulty Laryngoscope Size: Mac and 3 Grade View: Grade I Tube type: Oral Tube size: 7.0 mm Number of attempts: 1 Airway Equipment and Method: Stylet and Oral airway Placement Confirmation: ETT inserted through vocal cords under direct vision,  positive ETCO2 and breath sounds checked- equal and bilateral Secured at: 22 cm Tube secured with: Tape Dental Injury: Teeth and Oropharynx as per pre-operative assessment

## 2020-04-07 NOTE — Transfer of Care (Signed)
Immediate Anesthesia Transfer of Care Note  Patient: Cassondra Kosciolek  Procedure(s) Performed: Procedure(s) (LRB): XI ROBOTIC TOTAL LAPAROSCOPIC HYSTERECTOMY, BILATERAL SALPINGO OOPHORECTOMY (Bilateral) VAGINAL MYOMECTOMY (N/A)  Patient Location: PACU  Anesthesia Type: General  Level of Consciousness: awake, oriented, sedated and patient cooperative  Airway & Oxygen Therapy: Patient Spontanous Breathing and Patient connected to face mask oxygen  Post-op Assessment: Report given to PACU RN and Post -op Vital signs reviewed and stable  Post vital signs: Reviewed and stable  Complications: No apparent anesthesia complications Last Vitals:  Vitals Value Taken Time  BP 97/87 04/07/20 1045  Temp 36.4 C 04/07/20 1045  Pulse 58 04/07/20 1048  Resp 16 04/07/20 1048  SpO2 100 % 04/07/20 1048  Vitals shown include unvalidated device data.  Last Pain:  Vitals:   04/07/20 0749  PainSc: 0-No pain      Patients Stated Pain Goal: 5 (04/07/20 0749)

## 2020-04-07 NOTE — Anesthesia Postprocedure Evaluation (Signed)
Anesthesia Post Note  Patient: Rachel Rivas  Procedure(s) Performed: XI ROBOTIC TOTAL LAPAROSCOPIC HYSTERECTOMY, BILATERAL SALPINGO OOPHORECTOMY (Bilateral Abdomen) VAGINAL MYOMECTOMY (N/A Vagina )     Patient location during evaluation: PACU Anesthesia Type: General Level of consciousness: awake and alert and oriented Pain management: pain level controlled Vital Signs Assessment: post-procedure vital signs reviewed and stable Respiratory status: spontaneous breathing, nonlabored ventilation and respiratory function stable Cardiovascular status: blood pressure returned to baseline and stable Postop Assessment: no apparent nausea or vomiting Anesthetic complications: no    Last Vitals:  Vitals:   04/07/20 1100 04/07/20 1115  BP: (!) 106/58 120/69  Pulse: (!) 56 (!) 59  Resp: 16 14  Temp:    SpO2: 100% 100%    Last Pain:  Vitals:   04/07/20 1115  PainSc: 0-No pain                 Coy Vandoren A.

## 2020-04-07 NOTE — Discharge Summary (Signed)
Physician Discharge Summary  Patient ID: Rachel Rivas MRN: [id number] DOB/AGE: [birth year] 59 y.o.  Admit date: 04/07/2020 Discharge date: 04/07/2020  Admission Diagnoses: Post-operative state [Z98.890] Postoperative state [Z98.890]   Discharge Diagnoses:  Active Problems:   Post-operative state   Postoperative state   Discharged Condition: good  Consults:None  Significant Diagnostic Studies: labs: Hb 9.2 postop  Treatments:surgery: Vaginal myomectomy/XI Robotic TLH/BSO/Lysis of Adhesions  Vitals:   04/07/20 1300 04/07/20 1646  BP: 127/75 128/77  Pulse: 65 73  Resp: 19 18  Temp: 98 F (36.7 C) 98.7 F (37.1 C)  SpO2: 100% 100%     Total I/O In: 2775 [P.O.:1200; I.V.:1475; IV Piggyback:100] Out: 230 [Urine:200; Blood:30]   Hospital Course: Good  Discharge Exam: Normal Postop per nursing  Disposition: Discharge disposition: 01-Home or Self Care       Discharge Instructions    Discharge patient   Complete by: As directed    Discharge disposition: 01-Home or Self Care   Discharge patient date: 04/07/2020       Allergies as of 04/07/2020      Reactions   Latex Itching, Dermatitis, Rash   Sulfur Nausea Only      Medication List    STOP taking these medications   megestrol 40 MG tablet Commonly known as: MEGACE     TAKE these medications   cyclobenzaprine 10 MG tablet Commonly known as: FLEXERIL Take 1 tablet (10 mg total) by mouth at bedtime as needed for muscle spasms.   famotidine 20 MG tablet Commonly known as: PEPCID TAKE 1 TABLET BY MOUTH TWICE DAILY FOR  HEARTBURN What changed: See the new instructions.   ferrous sulfate 325 (65 FE) MG tablet Take 1 tablet (325 mg total) by mouth 3 (three) times daily with meals.   folic acid 1 MG tablet Commonly known as: FOLVITE Take 2 mg by mouth daily.   hydrochlorothiazide 12.5 MG capsule Commonly known as: MICROZIDE Take 1 capsule by mouth once daily   ibuprofen 800 MG tablet Commonly  known as: ADVIL Take 1 tablet (800 mg total) by mouth every 8 (eight) hours as needed. What changed: reasons to take this   losartan 50 MG tablet Commonly known as: COZAAR Take 1 tablet by mouth once daily   methotrexate 2.5 MG tablet Commonly known as: RHEUMATREX Take 8 tablets by mouth once a week.   oxyCODONE-acetaminophen 7.5-325 MG tablet Commonly known as: Percocet Take 1 tablet by mouth every 6 (six) hours as needed for severe pain.        Follow-up Information    Princess Bruins, MD Follow up in 3 week(s).   Specialty: Obstetrics and Gynecology Contact information: [address] [place] [address] [phone]            Signed: Princess Bruins 04/07/2020, 5:06 PM

## 2020-04-07 NOTE — H&P (Signed)
Rachel Rivas is an 59 y.o. female. SK:[id number]    RP: Vaginal Mymectomy/XI Robotic TLH/BSO  HPI: PMB started on Megace. Had increased pelvic pain/pressure the day after her office visit here on 03/13/20. Presented to the ER and Megace was increased to 80 mg TID. Now back to Megace 40 mg BID with minimal bleeding and much decreased pelvic/vaginal pain.  Menstrual History:  Patient's last menstrual period was 11/17/2012.    Past Medical History:  Diagnosis Date  . Adenomatous colon polyp   . Allergy    occasionally  . Anemia    yrs ago, low platelets  . Arthritis    RA  . Blood transfusion without reported diagnosis    1985 with c section  . Borderline diabetic   . Dyspnea    While taking Megace  . GERD (gastroesophageal reflux disease)   . Hypertension   . Obstructive sleep apnea syndrome 01/07/2020  . Thrombocytopenia ([place])     Past Surgical History:  Procedure Laterality Date  . CESAREAN SECTION    . COLONOSCOPY    . POLYPECTOMY    . STERILIZATION    . TUBAL LIGATION      Family History  Problem Relation Age of Onset  . Lung cancer Mother   . Hypertension Mother   . Diabetes Mother   . Uterine cancer Mother   . Hypertension Father   . Diabetes Father   . Stroke Father   . Colon cancer Neg Hx   . Colon polyps Neg Hx   . Esophageal cancer Neg Hx   . Rectal cancer Neg Hx   . Stomach cancer Neg Hx     Social History:  reports that she has quit smoking. Her smoking use included cigarettes. She has a 3.00 pack-year smoking history. She has never used smokeless tobacco. She reports current alcohol use. She reports that she does not use drugs.  Allergies:  Allergies  Allergen Reactions  . Latex Itching, Dermatitis and Rash  . Sulfur Nausea Only    Medications Prior to Admission  Medication Sig Dispense Refill Last Dose  . famotidine (PEPCID) 20 MG tablet TAKE 1 TABLET BY MOUTH TWICE DAILY FOR  HEARTBURN (Patient taking differently: Take 20 mg by mouth 2  (two) times daily. ) 180 tablet 0 Past Week at Unknown time  . ferrous sulfate 325 (65 FE) MG tablet Take 1 tablet (325 mg total) by mouth 3 (three) times daily with meals. 90 tablet 1 04/06/2020 at Unknown time  . folic acid (FOLVITE) 1 MG tablet Take 2 mg by mouth daily.   3 04/06/2020 at Unknown time  . hydrochlorothiazide (MICROZIDE) 12.5 MG capsule Take 1 capsule by mouth once daily 90 capsule 1 04/06/2020 at Unknown time  . losartan (COZAAR) 50 MG tablet Take 1 tablet by mouth once daily 90 tablet 1 04/06/2020 at Unknown time  . megestrol (MEGACE) 40 MG tablet Take 2 tablets (80 mg total) by mouth 2 (two) times daily. 80 tablet 1 04/06/2020 at Unknown time  . methotrexate (RHEUMATREX) 2.5 MG tablet Take 8 tablets by mouth once a week.   Past Week at Unknown time  . cyclobenzaprine (FLEXERIL) 10 MG tablet Take 1 tablet (10 mg total) by mouth at bedtime as needed for muscle spasms. 15 tablet 0 More than a month at Unknown time  . ibuprofen (ADVIL) 800 MG tablet Take 1 tablet (800 mg total) by mouth every 8 (eight) hours as needed for moderate pain. 30 tablet 0 More than a  month at Unknown time    REVIEW OF SYSTEMS: A ROS was performed and pertinent positives and negatives are included in the history.  GENERAL: No fevers or chills. HEENT: No change in vision, no earache, sore throat or sinus congestion. NECK: No pain or stiffness. CARDIOVASCULAR: No chest pain or pressure. No palpitations. PULMONARY: No shortness of breath, cough or wheeze. GASTROINTESTINAL: No abdominal pain, nausea, vomiting or diarrhea, melena or bright red blood per rectum. GENITOURINARY: No urinary frequency, urgency, hesitancy or dysuria. MUSCULOSKELETAL: No joint or muscle pain, no back pain, no recent trauma. DERMATOLOGIC: No rash, no itching, no lesions. ENDOCRINE: No polyuria, polydipsia, no heat or cold intolerance. No recent change in weight. HEMATOLOGICAL: No anemia or easy bruising or bleeding. NEUROLOGIC: No headache,  seizures, numbness, tingling or weakness. PSYCHIATRIC: No depression, no loss of interest in normal activity or change in sleep pattern.     Last menstrual period 11/17/2012.  Physical Exam:  See office notes  Covid Negative  Hb 03/26/2020 at 9.5 (Improved from 9.0 on 03/19/2020)  Pelvic US 04/02/2020: T/V images. Anteverted uterus measuring 12.13 x 5.41 x 6.46 cm. 2 small intramural calcified fibroids are present. A large fibroid measuring 7 x 5 cm is present in the cervical canal protruding in the vagina with a large vascular connection between the mass and the uterine cavity. Fluid in the endometrial cavity outlinesthin symmetrical endometrial walls,the combined measurement is at 4.56 mm.. Both ovaries are small with atrophic appearance. No adnexal mass. No free fluid in the posterior cul-de-sac.   Assessment/Plan:58 y.[id number]   1. Postmenopausal bleeding Heavy postmenopausal bleeding secondary to a large fibroid prolapsed vaginally. Hb 9.5 on 5/6th.  Continue with megestrol 40 mg BID and FeSO4 until surgery. Decision to proceed with XIrobotic total laparoscopic hysterectomy with bilateral salpingo-oophorectomy. Excision of the prolapsedfibroid vaginally at the time of the robotic hysterectomy,before docking the robot. Information and pamphlet given to patient.  Surgery thoroughly reviewed including preop preparation, surgical procedure with risks/benefits and postop precautions and expectations.  2. Fibroids As above.  3. Anemia, unspecified type Hb 9.5 on 03/26/2020.  Megace and FeSO4 until surgery.  Other orders - ferrous sulfate 325 (65 FE) MG tablet; Take 1 tablet (325 mg total) by mouth 3 (three) times daily with meals. - megestrol (MEGACE) 40 MG tablet; Take 1 tab BID until surgery                        Patient was counseled as to the risk of surgery to include the following:  1. Infection (prohylactic antibiotics will be administered)  2.  DVT/Pulmonary Embolism (prophylactic pneumo compression stockings will be used)  3.Trauma to internal organs requiring additional surgical procedure to repair any injury to internal organs requiring perhaps additional hospitalization days.  4.Hemmorhage requiring transfusion and blood products which carry risks such as anaphylactic reaction, hepatitis and AIDS  Patient had received literature information on the procedure scheduled and all her questions were answered and fully accepts all risk.   Rachel Rivas 04/07/2020, 7:47 AM

## 2020-04-07 NOTE — Op Note (Signed)
Operative Note  04/07/2020  11:08 AM  PATIENT:  Rachel Rivas  59 y.o. female  PRE-OPERATIVE DIAGNOSIS:  Large prolapsed fibroid vaginally, menometrorrhagia with anemia  POST-OPERATIVE DIAGNOSIS:  Large prolapsed fibroid vaginally, menometrorrhagia with anemia.  Omental adhesions with anterior abdominal wall.  PROCEDURE:  Procedure(s): XI ROBOTIC TOTAL LAPAROSCOPIC HYSTERECTOMY, BILATERAL SALPINGO OOPHORECTOMY/LYSIS OF ADHESIONS/VAGINAL MYOMECTOMY  SURGEON:  Surgeon(s): Princess Bruins, MD Joseph Pierini, MD  ANESTHESIA:   general  FINDINGS: Large necrotic vaginally prolapsed uterine myoma.  Uterus with small subserosal/intramural myoma.  Bilateral tubes post tubal ligation.  Bilateral normal ovaries.  Omental adhesions with anterior abdominal wall.  DESCRIPTION OF OPERATION: Under general anesthesia with endotracheal intubation the patient is in lithotomy position.  She is prepped with DuraPrep on the abdomen and Betadine on the suprapubic, vulvar and vaginal area.  Patient is draped as usual.  Timeout done.  Foley inserted in the bladder.  The bimanual exam reveals an anteverted uterus mildly increased in volume, mobile with no adnexal mass felt.  A large myoma is protruding through the cervix into the vagina.  Weighted speculum put in place.  The fibroid appears partially necrotic.  A tenaculum is applied on it for traction and the tissue gives off easily.  It is reapplied closer to the large pedicle at the level of the cervix.  An Endoloop is applied at that level, as high as possible.  We then use the Bovie to cut the pedicle by cauterization at the Endo cervical canal.  Dark blood is present but no active bleeding after the myomectomy.  The specimen will be sent to pathology together with the uterus and ovaries later on.  The tenaculum is applied on the anterior lip of the cervix.  The hysterometry is at 10 cm.  A #8 Rumi with the medium Koh ring are put in place easily.  Given the  dilation of the cervix, we suture the Koh ring to the cervix with a Vicryl 0.  The other instruments are removed.  We go to the abdomen.      We infiltrated the supraumbilical area with Marcaine one quarter plain.  A 2 cm incision is done with the scalpel at that level.  The aponeurosis is grasped with Coker clamps.  The aponeurosis is open with Mayo scissors.  The parietal peritoneum is opened bluntly with the finger.  The Sheryle Hail is inserted at that level under direct vision.  Up pneumoperitoneum is created with CO2.  The camera is inserted.  Omental adhesions are present in the lower abdomen at the midline.  No ideation present at the port sites in line with the umbilicus.  We therefore infiltrate the skin with Marcaine one quarter plain at the port sites.  We make small incisions of 8 mm with the scalp L at each site.  2 robotic ports are inserted under direct vision on the right side in line with the umbilicus and 1 robotic ports at the distal aspect of the left side in line with the umbilicus.  The assistant port is inserted under direct vision at the medial aspect of the right side in line with the umbilicus.  The patient is positioned in 30 degree Trendelenburg.  The robot is docked and targeting is done.  We inserted the robotic instruments under direct vision with the fenestrated clamp in the fourth arm, the scissors in the third arm, the camera is in the second arm and the long bipolar in the first arm.  We go to the console.  We start with lysis of a lesions of the omentum with the anterior abdominal wall.  Hemostasis is adequate.  Both ureters are in normal anatomic location with good peristalsis.  We start on the left side with cauterization and section of the left infundibulopelvic ligament.  We then follow under the left ovary cauterizing and sectioning.  We then cauterized and section the left round ligament.  We descend along the left broad ligament.  We then opened the visceral peritoneum  anteriorly and started distending the bladder.  We proceeded exactly the same way on the right side.  We further descend the bladder well past the [facility] ring.  The right uterine artery is cauterized.  We then cauterized and section the left uterine artery.  We go back to the right cauterizing again and sectioning the right uterine artery.  Hemostasis is adequate at all levels.  The vaginal occluder is inflated.  We open the superior aspect of the vagina with the tip of the scissors on the Koh ring anteriorly, on either side and posteriorly.  The uterus is completely detached with both tubes and both ovaries.  The specimen is easily passed vaginally.  The uterus with cervix, both tubes and both ovaries as well as the uterine myoma removed vaginally are all sent together to pathology.  The vaginal occluder is put back in place.  We verify and complete hemostasis at the vaginal vault.  We used a V-Loc 0 at 9 inches to close the vagina starting at the right angle all the way to the left angle and then to the midline.  We irrigate and suction the pelvic cavity.  Hemostasis is adequate.  Both ureters show good peristalsis and the urine is clear.  Pictures were taken at the beginning of the procedure and now again after the hysterectomy.  The vaginal occluder is removed.  Robotic instruments are removed.  The robot is undocked.  The deep Trendelenburg is removed.  We verified by laparoscopy and no additional fluid needs to be removed.  Laparoscopic instruments are removed.  All ports are removed under direct vision.  The CO2 is evacuated.  The pursestring stitch at the supraumbilical incision is attached.  All incisions are closed with a Vicryl 4-0 in a subcuticular stitch.  Dermabond is added on all incisions.  Hemostasis is adequate.  The patient is brought to recovery room in good and stable status.        ESTIMATED BLOOD LOSS: 30 mL   Intake/Output Summary (Last 24 hours) at 04/07/2020 1108 Last data filed at  04/07/2020 1048 Gross per 24 hour  Intake 800 ml  Output 130 ml  Net 670 ml     BLOOD ADMINISTERED:none   LOCAL MEDICATIONS USED:  MARCAINE     SPECIMEN:  Source of Specimen:  Uterus with cervix, bilateral tubes and ovaries.  Uterine myoma prolapsed vaginally.  DISPOSITION OF SPECIMEN:  PATHOLOGY  COUNTS:  YES  PLAN OF CARE: Transfer to PACU  Marie-Lyne LavoieMD11:08 AM

## 2020-04-07 NOTE — Progress Notes (Signed)
Patient has voided x 3, each amount  In order 25 ml, 25 ml, and 50 ml. Bladder scan performed, PVR noted to be at 7 ml. Patient denies any discomfort or bladder distention. Patient has had more than adequate PO intake. MD notified, also discussed labs. Patient has met all other post-op measures in order to discharge home. Orders received to discharge home.

## 2020-04-08 ENCOUNTER — Encounter: Payer: Self-pay | Admitting: Anesthesiology

## 2020-04-17 ENCOUNTER — Telehealth: Payer: Self-pay

## 2020-04-17 NOTE — Telephone Encounter (Signed)
Questions about daughter's FMLA and extending to intermittent so she can help her as she needs but still be able to work some. Daughter will check with HR Dept at job to see if I can addend current FMLA form or need a new form.

## 2020-04-20 ENCOUNTER — Encounter (HOSPITAL_COMMUNITY): Payer: Self-pay

## 2020-04-22 ENCOUNTER — Telehealth: Payer: Self-pay

## 2020-04-22 ENCOUNTER — Telehealth: Payer: Self-pay | Admitting: *Deleted

## 2020-04-22 NOTE — Telephone Encounter (Signed)
Called and scheduled the patient for a new patient appt. Gave the address and phone number of the clinic. Appt for 6/4 at 9:30

## 2020-04-22 NOTE — Telephone Encounter (Signed)
Dr. Dellis Filbert called patient and discussed result. Referring to Gyneco-Oncology.

## 2020-04-22 NOTE — Telephone Encounter (Signed)
-----   Message from Princess Bruins, MD sent at 04/21/2020  5:25 PM EDT ----- Regarding: Refer to Gyneco-Oncology Please refer to Gyneco-Oncology ASAP.  Patient would go to [facility] if earlier appointment.  If patient would like to see me between now and her Gyn-Onco visit, please schedule.

## 2020-04-22 NOTE — Telephone Encounter (Signed)
Patient scheduled with Dr.Tucker on 04/24/20 @ 9:30am. Sharyn Lull from Gyn-oncology called and informed patient.

## 2020-04-22 NOTE — Progress Notes (Signed)
GYNECOLOGIC ONCOLOGY NEW PATIENT CONSULTATION   Patient Name: Rachel Rivas  Patient Age: 59 y.o. Date of Service: 04/24/20 Referring Provider: Dr. 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  Spent some time reviewing in depth with the patient and her 2 daughters, who accompanied her to the appointment today, pathologic diagnosis of adenosarcoma.  I reviewed the 2 components, the glandular and sarcomatous component of her tumor.  Given what is available to me from a pathology standpoint.  I am unsure whether only the prolapsing fibroid was involved or whether there was any other tumor involvement of her uterus or adnexa.  I discussed that adenosarcoma is a rare tumor of the uterus.  Unfortunately, both the adenocarcinoma component as well as the sarcomatous component have aggressive features histologically.  The adenocarcinoma component is high-grade in the sarcomatous component, from what I can tell, has sarcomatous overgrowth.  While the data is limited in terms of this disease process as well as management strategies, the pathologic features make me concerned that this is an aggressive tumor, even if confined to the prolapsing fibroid.  There is no standard of care therapy and we discussed possibilities including observation, hormonal therapy, traditional chemotherapy and radiation.  Some of this will depend on radiologic staging.  I have ordered a CT of the chest, abdomen, and pelvis.  Additionally, I would like some clarification from pathology as to the tumor involvement and we will asked that additional staining, such as hormone receptor staining, be added.  I will have the patient's case presented at our tumor conference.  Given the pathologic features, I will very likely recommend some  adjuvant therapy.  Given the limited data that we have about this type of cancer, I think it is very likely that the cancer will recur even with adjuvant therapy.  We discussed that the goal would be to achieve as long a cancer free interval as possible while limiting systemic toxicity.  The patient is healing well from a postoperative standpoint.  She has a visit with her gynecologist on the ninth.  She was given some adhesive remover to remove the Dermabond present on her abdominal incisions when she next showers.  I will have a phone visit with her after the CT is performed and I have further information from our pathologist.  All the patient's and her daughter's questions were answered today.  A copy of this note was sent to the patient's referring provider.   55 minutes of total time was spent for this patient encounter, including preparation, face-to-face counseling with the patient and coordination of care, and documentation of the encounter.   Jeral Pinch, MD  Division of Gynecologic Oncology  Department of Obstetrics and Gynecology  [facility]  ___________________________________________  Chief Complaint: Chief Complaint  Patient presents with  . Adenosarcoma    New patient    History of Present Illness:  Rachel Rivas is a 59 y.o. y.o. female who is seen in consultation at the request of Dr. Dellis Filbert for an evaluation of incidentally diagnosed adenosarcoma of the uterus.  She developed PMB in early April after going through menopause at approximately age 29. She saw her PCP on 4/13 for bleeding and an ultrasound was obtained showing a thickened endometrial lining and 3-4cm mass within the endocervical canal. She was referred to [facility], started on Megace  and ultimately underwent a TRH/BSO on 5/18 for abnormal uterine bleeding with resulting anemia, large prolapsing fibroid.   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   The patient reports overall healing well since surgery.  She continues to have some abdominal soreness but denies significant pain.  She has her postoperative visit on the ninth.  She denies any vaginal bleeding or discharge.  She endorses regular bowel and urinary function.  She denies any postoperative fevers or chills.  Her family history is notable for mother with uterine cancer initially treated with surgery, radiation and chemotherapy.  She had recurrence about a year after completing treatment and was diagnosed with metastatic disease in her lungs.  Patient denies any other family history of gynecologic or colon cancer.  The patient has a history of obstructive sleep apnea for which she uses a CPAP.  She denies any chest pain with ambulation or rest.  She has had intermittent shortness of breath since contracting Covid last year.  PAST MEDICAL HISTORY:  Past Medical History:  Diagnosis Date  . Allergy    occasionally  . Anemia    yrs ago, low platelets  . Arthritis    RA  . Blood transfusion without reported diagnosis    1985 with c section  . Borderline diabetic   . Dyspnea    While taking Megace  . GERD (gastroesophageal reflux disease)   . Hypertension   . Obstructive sleep apnea syndrome 01/07/2020  . Thrombocytopenia ([place])      PAST SURGICAL HISTORY:  Past Surgical History:  Procedure Laterality Date  . CESAREAN SECTION    . COLONOSCOPY    . MYOMECTOMY N/A 04/07/2020   Procedure: VAGINAL MYOMECTOMY;  Surgeon: Princess Bruins, MD;  Location: [facility];  Service: Gynecology;  Laterality: N/A;  . POLYPECTOMY    . ROBOTIC ASSISTED TOTAL HYSTERECTOMY WITH BILATERAL SALPINGO OOPHERECTOMY Bilateral 04/07/2020   Procedure: XI ROBOTIC TOTAL LAPAROSCOPIC HYSTERECTOMY, BILATERAL  SALPINGO OOPHORECTOMY;  Surgeon: Princess Bruins, MD;  Location: [facility];  Service: Gynecology;  Laterality: Bilateral;  . STERILIZATION    . TUBAL LIGATION      OB/GYN HISTORY:  OB History  Gravida Para Term Preterm AB Living  3 2     1 2   SAB TAB Ectopic Multiple Live Births    1          # Outcome Date GA Lbr Len/2nd Weight Sex Delivery Anes PTL Lv  3 TAB           2 Para           1 Para             Patient's last menstrual period was 11/17/2012.  Age at menarche: 21  Age at menopause: 33 Hx of HRT: denies Hx of STDs: no Last pap: 11/2016 History of abnormal pap smears: no  SCREENING STUDIES:  Last mammogram: 01/2020  Last colonoscopy: 2017 Last bone mineral density: 2016  MEDICATIONS: Outpatient Encounter Medications as of 04/24/2020  Medication Sig  . cyclobenzaprine (FLEXERIL) 10 MG tablet Take 1 tablet (10 mg total) by mouth at bedtime as needed for muscle spasms.  . famotidine (PEPCID) 20 MG tablet TAKE 1 TABLET BY MOUTH TWICE DAILY FOR  HEARTBURN (Patient taking differently: Take 20 mg by mouth 2 (two) times daily. )  .  ferrous sulfate 325 (65 FE) MG tablet Take 1 tablet (325 mg total) by mouth 3 (three) times daily with meals.  . folic acid (FOLVITE) 1 MG tablet Take 2 mg by mouth daily.   . hydrochlorothiazide (MICROZIDE) 12.5 MG capsule Take 1 capsule by mouth once daily  . ibuprofen (ADVIL) 800 MG tablet Take 1 tablet (800 mg total) by mouth every 8 (eight) hours as needed.  Marland Kitchen losartan (COZAAR) 50 MG tablet Take 1 tablet by mouth once daily  . methotrexate (RHEUMATREX) 2.5 MG tablet Take 8 tablets by mouth once a week.  Marland Kitchen oxyCODONE-acetaminophen (PERCOCET) 7.5-325 MG tablet Take 1 tablet by mouth every 6 (six) hours as needed for severe pain.   No facility-administered encounter medications on file as of 04/24/2020.    ALLERGIES:  Allergies  Allergen Reactions  . Latex Itching, Dermatitis and Rash  . Sulfur Nausea Only     FAMILY  HISTORY:  Family History  Problem Relation Age of Onset  . Lung cancer Mother   . Hypertension Mother   . Diabetes Mother   . Uterine cancer Mother   . Cancer Mother        endometrial  . Hypertension Father   . Diabetes Father   . Stroke Father   . Colon cancer Neg Hx   . Colon polyps Neg Hx   . Esophageal cancer Neg Hx   . Rectal cancer Neg Hx   . Stomach cancer Neg Hx      SOCIAL HISTORY:    Social Connections:   . Frequency of Communication with Friends and Family:   . Frequency of Social Gatherings with Friends and Family:   . Attends Religious Services:   . Active Member of Clubs or Organizations:   . Attends Archivist Meetings:   Marland Kitchen Marital Status:     REVIEW OF SYSTEMS:  +Anxiety Denies appetite changes, fevers, chills, fatigue, unexplained weight changes. Denies hearing loss, neck lumps or masses, mouth sores, ringing in ears or voice changes. Denies cough or wheezing.  Denies shortness of breath. Denies chest pain or palpitations. Denies leg swelling. Denies abdominal distention, blood in stools, constipation, diarrhea, nausea, vomiting, or early satiety. Denies pain with intercourse, dysuria, frequency, hematuria or incontinence. Denies hot flashes, pelvic pain, vaginal bleeding or vaginal discharge.   Denies joint pain, back pain or muscle pain/cramps. Denies itching, rash, or wounds. Denies dizziness, headaches, numbness or seizures. Denies swollen lymph nodes or glands, denies easy bruising or bleeding. Denies depression, confusion, or decreased concentration.  Physical Exam:  Vital Signs for this encounter:  Blood pressure 111/60, pulse 74, temperature 98.3 F (36.8 C), temperature source Temporal, resp. rate 17, height 5\' 3"  (1.6 m), weight 197 lb 6.4 oz (89.5 kg), last menstrual period 11/17/2012, SpO2 100 %. Body mass index is 34.97 kg/m. General: Alert, oriented, no acute distress.  HEENT: Normocephalic, atraumatic. Sclera anicteric.   Chest: Clear to auscultation bilaterally. No wheezes, rhonchi, or rales. Cardiovascular: Regular rate and rhythm, no murmurs, rubs, or gallops.  Abdomen: Obese. Normoactive bowel sounds. Soft, nondistended, nontender to palpation. No masses or hepatosplenomegaly appreciated. No palpable fluid wave.  Well healing robotic incisions. Extremities: Grossly normal range of motion. Warm, well perfused. No edema bilaterally.  Skin: No rashes or lesions.  Lymphatics: No cervical, supraclavicular, or inguinal adenopathy.  GU:  Normal external female genitalia. No lesions. No discharge or bleeding.             Bladder/urethra:  No lesions or masses,  well supported bladder             Vagina: Minimal discharge in the vault. Cuff intact, suture visible. No lesions noted.             Cervix/uterus: surgically absent.  Rectal: no nodularity.  LABORATORY AND RADIOLOGIC DATA:  Outside medical records were reviewed to synthesize the above history, along with the history and physical obtained during the visit.   5/18:FINAL MICROSCOPIC DIAGNOSIS:   A. UTERUS, CERVIX AND BILATERAL FALLOPIAN TUBES AND OVARIES,  HYSTERECTOMY AND BILATERAL SALPINGO-OOPHORECTOMY:   - See OUTSIDE PATHOLOGY REPORT under RESULTS REVIEW or the LABS TAB  under "SURGICAL PATHOLOGY" in [facility].   COMMENT:   Please note that the request for "markers of myogenic differentiation"  stated in the consultation report  (i.e. immunohistochemistry for  myogenic differentiation) is in progress.   The result will be issued in an addendum.   Dr. Princess Bruins was paged on 04/17/2020.   AMENDMENT NOTE:   The original report indicated a PRELIMINARY diagnosis.   The final report indicates a FINAL diagnosis.   GROSS DESCRIPTION:   Specimen: TAH/BSO and separate nodule clinically identified as vaginally  prolapsed uterine fibroid, received fresh.  Specimen integrity: The uterus is intact. The separate nodule is  disrupted.  Size and  shape: The uterus measures 9.5 x 5.3 x 4.7 cm and is  symmetrical. The separate nodule measures 7.5 x 5.3 x 4.2 cm.  Weight: The uterus weighs 151 g. The separate nodule weighs 61 g.  Serosa: Smooth, tan.  Cervix: Ectocervical mucosa is smooth, tan and the external os is patent  measuring 2.5 cm. The endocervical mucosa is glistening, tan-pink.  Endometrium: The endometrial cavity measures 6.5 x 3.7 cm. The  endometrium is glistening, tan-red and measures up to 0.5 cm in  thickness.  Myometrium: Up to 3 cm in thickness; 3 well-circumscribed white, whorled  myometrial nodules ranging from 1.5 to 2.3 cm. The nodules are focally  calcified.  Separate nodule: The cut surface of the separate nodule is tan-pink.  Right adnexa: The 1.7 x 1.2 x 1 cm right ovary is intact. The cortical  and cut surfaces are unremarkable. The right fallopian tube shows a  previous ligation with the midportion absent. The fallopian tube  measures 5.5 cm in length by 0.6 cm in diameter.  Left adnexa: The 1.6 x 1.1 x 0.8 cm left ovary is intact. The cortical  and cut surfaces are unremarkable. The left fallopian tube shows a  previous ligation with a midportion absent. The left fallopian tube  measures 5.2 cm in length by 0.6 cm in diameter.  Block Summary: 13 blocks submitted.  1 = cervix  2-7 = endomyometrium and myometrial nodules.  8-9 = separate nodule.  10-11 = right adnexa.  12-13 = left adnexa  14-15 = sections of separate nodule  16-43 = remainder of separate nodule (GRP 04/07/2020).    Lab Results  Component Value Date   WBC 15.4 (H) 04/07/2020   HGB 9.4 (L) 04/07/2020   HCT 30.0 (L) 04/07/2020   PLT 124 (L) 04/07/2020   GLUCOSE 92 03/26/2020   CHOL 179 12/30/2019   TRIG 66.0 12/30/2019   HDL 73.50 12/30/2019   LDLCALC 92 12/30/2019   ALT 20 03/14/2020   AST 16 03/14/2020   NA 139 03/26/2020   K 3.9 03/26/2020   CL 103 03/26/2020   CREATININE 0.96 03/26/2020   BUN 23 (H)  03/26/2020   CO2 26 03/26/2020  TSH 0.70 10/28/2019   INR 1.1 (H) 09/14/2016   HGBA1C 6.2 12/30/2019   Pelvic ultrasound on 03/06/20: Markedly thickened heterogeneous, and hypervascular endometrial complex up to 38 mm thick highly concerning for endometrial neoplasm.  Additional more focal 3.0 x 2.8 x 5.3 cm diameter mass within endocervical canal, may represent extension of above endometrial pathology versus separate endocervical tumor.  Tissue diagnosis recommended.  Nonvisualization of ovaries.

## 2020-04-24 ENCOUNTER — Encounter: Payer: Self-pay | Admitting: Gynecologic Oncology

## 2020-04-24 ENCOUNTER — Inpatient Hospital Stay: Payer: PRIVATE HEALTH INSURANCE

## 2020-04-24 ENCOUNTER — Telehealth: Payer: Self-pay

## 2020-04-24 ENCOUNTER — Encounter: Payer: Self-pay | Admitting: Anesthesiology

## 2020-04-24 ENCOUNTER — Inpatient Hospital Stay: Payer: PRIVATE HEALTH INSURANCE | Attending: Gynecologic Oncology | Admitting: Gynecologic Oncology

## 2020-04-24 ENCOUNTER — Other Ambulatory Visit: Payer: Self-pay

## 2020-04-24 VITALS — BP 111/60 | HR 74 | Temp 98.3°F | Resp 17 | Ht 63.0 in | Wt 197.4 lb

## 2020-04-24 DIAGNOSIS — C549 Malignant neoplasm of corpus uteri, unspecified: Secondary | ICD-10-CM | POA: Insufficient documentation

## 2020-04-24 DIAGNOSIS — Z90710 Acquired absence of both cervix and uterus: Secondary | ICD-10-CM | POA: Diagnosis not present

## 2020-04-24 DIAGNOSIS — Z90722 Acquired absence of ovaries, bilateral: Secondary | ICD-10-CM | POA: Diagnosis not present

## 2020-04-24 DIAGNOSIS — C55 Malignant neoplasm of uterus, part unspecified: Secondary | ICD-10-CM | POA: Diagnosis not present

## 2020-04-24 DIAGNOSIS — Z79818 Long term (current) use of other agents affecting estrogen receptors and estrogen levels: Secondary | ICD-10-CM | POA: Diagnosis not present

## 2020-04-24 LAB — BASIC METABOLIC PANEL
Anion gap: 13 (ref 5–15)
BUN: 15 mg/dL (ref 6–20)
CO2: 25 mmol/L (ref 22–32)
Calcium: 10.1 mg/dL (ref 8.9–10.3)
Chloride: 104 mmol/L (ref 98–111)
Creatinine, Ser: 1.1 mg/dL — ABNORMAL HIGH (ref 0.44–1.00)
GFR calc Af Amer: >60 mL/min (ref >60)
GFR calc non Af Amer: 55 mL/min — ABNORMAL LOW (ref >60)
Glucose, Bld: 114 mg/dL — ABNORMAL HIGH (ref 70–99)
Potassium: 3.5 mmol/L (ref 3.5–5.1)
Sodium: 142 mmol/L (ref 135–145)

## 2020-04-24 NOTE — Telephone Encounter (Signed)
Told Rachel Rivas that her kidney function is slightly elevated.  This indicates that she may be a little dehydrated per [name],NP.  She needs to make sure she is drinking at least 64 oz of caffine free fluid a day. Pt verbalized understanding. The kidney's will be evaluated with the CT Scan.

## 2020-04-24 NOTE — Patient Instructions (Signed)
It was nice to meet you today.  I will call you once I have the results from your CT scan as well as your pathology.  Additionally, I am going to have your case presented at our tumor conference given that this is a rare tumor.  If you have any questions or need to talk before scheduled phone visit, please call the clinic at [phone].

## 2020-04-28 ENCOUNTER — Other Ambulatory Visit: Payer: Self-pay

## 2020-04-29 ENCOUNTER — Encounter: Payer: Self-pay | Admitting: Obstetrics & Gynecology

## 2020-04-29 ENCOUNTER — Ambulatory Visit (INDEPENDENT_AMBULATORY_CARE_PROVIDER_SITE_OTHER): Payer: PRIVATE HEALTH INSURANCE | Admitting: Obstetrics & Gynecology

## 2020-04-29 VITALS — BP 130/70

## 2020-04-29 DIAGNOSIS — D649 Anemia, unspecified: Secondary | ICD-10-CM

## 2020-04-29 DIAGNOSIS — C55 Malignant neoplasm of uterus, part unspecified: Secondary | ICD-10-CM

## 2020-04-29 DIAGNOSIS — Z09 Encounter for follow-up examination after completed treatment for conditions other than malignant neoplasm: Secondary | ICD-10-CM

## 2020-04-29 LAB — CBC
HCT: 33 % — ABNORMAL LOW (ref 35.0–45.0)
Hemoglobin: 10.2 g/dL — ABNORMAL LOW (ref 11.7–15.5)
MCH: 27.3 pg (ref 27.0–33.0)
MCHC: 30.9 g/dL — ABNORMAL LOW (ref 32.0–36.0)
MCV: 88.5 fL (ref 80.0–100.0)
MPV: 13.5 fL — ABNORMAL HIGH (ref 7.5–12.5)
Platelets: 153 10*3/uL (ref 140–400)
RBC: 3.73 10*6/uL — ABNORMAL LOW (ref 3.80–5.10)
RDW: 13.8 % (ref 11.0–15.0)
WBC: 6.6 10*3/uL (ref 3.8–10.8)

## 2020-04-29 NOTE — Patient Instructions (Signed)
1. Status post gynecological surgery, follow-up exam Very good postop healing.  No complication.  Precautions reviewed with patient in terms of physical activities.  No sexual activity until 8 weeks postop.  2. Postoperative anemia Postop hemoglobin stable at 9.4.  CBC drawn today. - CBC  3. Adenosarcoma of uterus [facility]) Seen by Dr. Montel Culver April 24, 2020.  Further investigation with CT scan scheduled.  Complete pathologic report pending.  Further management with Dr. Berline Lopes.  Rachel Rivas, it was a pleasure seeing you today!

## 2020-04-29 NOTE — Progress Notes (Signed)
    Rachel Rivas 1961-08-05 [id number]        58 y.o.  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  Seen by Dr. Berline Lopes Gyneco-oncologist on April 24, 2020.  Good postop progression with no abdomino-pelvic pain.  No vaginal bleeding.  No vaginal discharge.  No fever.  Urine and BMs normal.  Postop Hb was stable at 9.4.   OB History  Gravida Para Term Preterm AB Living  3 2     1 2   SAB TAB Ectopic Multiple Live Births    1          # Outcome Date GA Lbr Len/2nd Weight Sex Delivery Anes PTL Lv  3 TAB           2 Para           1 Para             Past medical history,surgical history, problem list, medications, allergies, family history and social history were all reviewed and documented in the EPIC chart.   Directed ROS with pertinent positives and negatives documented in the history of present illness/assessment and plan.  Exam:  There were no vitals filed for this visit. General appearance:  Normal  Abdomen: Soft, no distension.  Non-tender.  Incisions healing well.  No erythema or induration.  Gynecologic exam: Vulva normal.  Speculum:  Vaginal vault well closed.  No erythema.  No blood.  Normal secretions.  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                Microscopy on the Uterus, cervix, tubes and ovaries pending.  Pathologist contacted by our office, per messages, planning to discuss pathology results with Dr Berline Lopes Gyn-Onco today.   Assessment/Plan:  59 y.o. [id number]   1. Status post gynecological surgery, follow-up exam Very good postop healing.  No complication.  Precautions reviewed with patient in terms of physical  activities.  No sexual activity until 8 weeks postop.  2. Postoperative anemia Postop hemoglobin stable at 9.4.  CBC drawn today. - CBC  3. Adenosarcoma of uterus [facility]) Seen by Dr. Montel Culver April 24, 2020.  Further investigation with CT scan scheduled.  Complete pathologic report pending.  Further management with Dr. Berline Lopes.   Princess Bruins MD, 10:11 AM 04/29/2020

## 2020-05-01 ENCOUNTER — Ambulatory Visit (HOSPITAL_COMMUNITY)
Admission: RE | Admit: 2020-05-01 | Discharge: 2020-05-01 | Disposition: A | Discharge status: Home / Self Care | Payer: PRIVATE HEALTH INSURANCE | Source: Ambulatory Visit | Attending: Gynecologic Oncology | Admitting: Gynecologic Oncology

## 2020-05-01 ENCOUNTER — Other Ambulatory Visit: Payer: Self-pay

## 2020-05-01 DIAGNOSIS — C549 Malignant neoplasm of corpus uteri, unspecified: Secondary | ICD-10-CM

## 2020-05-01 DIAGNOSIS — C55 Malignant neoplasm of uterus, part unspecified: Secondary | ICD-10-CM | POA: Insufficient documentation

## 2020-05-01 MED ORDER — SODIUM CHLORIDE (PF) 0.9 % IJ SOLN
INTRAMUSCULAR | Status: AC
  Filled 2020-05-01: qty 50

## 2020-05-01 MED ORDER — IOHEXOL 300 MG/ML  SOLN
100.0000 mL | Freq: Once | INTRAMUSCULAR | Status: AC | PRN
  Administered 2020-05-01: 100 mL via INTRAVENOUS

## 2020-05-03 LAB — SURGICAL PATHOLOGY

## 2020-05-04 ENCOUNTER — Telehealth: Payer: Self-pay | Admitting: Gynecologic Oncology

## 2020-05-04 ENCOUNTER — Telehealth: Payer: Self-pay | Admitting: Oncology

## 2020-05-04 ENCOUNTER — Telehealth: Payer: Self-pay | Admitting: *Deleted

## 2020-05-04 ENCOUNTER — Other Ambulatory Visit: Payer: Self-pay | Admitting: Oncology

## 2020-05-04 DIAGNOSIS — C549 Malignant neoplasm of corpus uteri, unspecified: Secondary | ICD-10-CM

## 2020-05-04 NOTE — Telephone Encounter (Signed)
Spoke with the patient and one of her duaghters about the tumor board discussion - reviewed pathology review and that appears more driven by the carcinomatous component (carcinosarcoma). Plan for MRI of the liver to help differentiate if metastatic or benign lesions. Discussed recommendation for chemotherapy and referral to Dr. Alvy Bimler. All questions answered.  Jeral Pinch MD Gynecologic Oncology

## 2020-05-04 NOTE — Progress Notes (Signed)
Gynecologic Oncology Multi-Disciplinary Disposition Conference Note  Date of the Conference: 05/04/2020  Patient Name: Rachel Rivas  Referring Provider: Dr. Dellis Filbert Primary GYN Oncologist: Dr. 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 Disposition is to MRI of the liver followed by adjuvant chemotherapy.   This Multidisciplinary conference took place involving physicians from [place], [place], Radiation Oncology, Pathology, Radiology along with the Gynecologic Oncology Nurse Practitioner and RN.  Comprehensive assessment of the patient's malignancy, staging, need for surgery, chemotherapy, radiation therapy, and need for further testing were reviewed. Supportive measures, both inpatient and following discharge were also discussed. The recommended plan of care is documented. Greater than 35 minutes were spent correlating and coordinating this patient's care.

## 2020-05-04 NOTE — Telephone Encounter (Signed)
Rachel Rivas with new patient appointment with Dr. Alvy Bimler on 05/13/20 at 8:40 am. She verbalized understanding and agreement.

## 2020-05-04 NOTE — Telephone Encounter (Signed)
Called the patient to review discussion and review from tumor board today. No answer, left VM with callback requested.  Given worrisome lesions, radiologist recommended MRI to help differentiate benign versus metastatic process. Pathology review confirms that this is a stage IB tumor and given concerning adenocarcinoma features, that this is more accurately a carcinosarcoma than an adenosarcoma.    Will plan to get an MRI of the liver and get patient scheduled to see Dr. Alvy Bimler to discusss adjuvant chemotherapy.  Jeral Pinch MD Gynecologic Oncology

## 2020-05-04 NOTE — Telephone Encounter (Signed)
Scheduled the patient for an MRI appt on 6/24, Called the patient with the date/time and instructions

## 2020-05-07 ENCOUNTER — Telehealth: Payer: Self-pay

## 2020-05-07 NOTE — Telephone Encounter (Signed)
LM for Rachel Rivas stating that Dr. Berline Lopes already spoke with her regarding CT Scan therefore the 05-08-20 appointment is cancelled. Call the office at [phone] if she has any further questions or concerns.

## 2020-05-08 ENCOUNTER — Ambulatory Visit: Payer: PRIVATE HEALTH INSURANCE | Admitting: Gynecologic Oncology

## 2020-05-08 ENCOUNTER — Other Ambulatory Visit: Payer: Self-pay | Admitting: Family Medicine

## 2020-05-08 DIAGNOSIS — K219 Gastro-esophageal reflux disease without esophagitis: Secondary | ICD-10-CM

## 2020-05-13 ENCOUNTER — Encounter (HOSPITAL_COMMUNITY): Payer: Self-pay

## 2020-05-13 ENCOUNTER — Other Ambulatory Visit: Payer: Self-pay

## 2020-05-13 ENCOUNTER — Encounter: Payer: Self-pay | Admitting: Hematology and Oncology

## 2020-05-13 ENCOUNTER — Inpatient Hospital Stay: Payer: PRIVATE HEALTH INSURANCE

## 2020-05-13 ENCOUNTER — Inpatient Hospital Stay (HOSPITAL_BASED_OUTPATIENT_CLINIC_OR_DEPARTMENT_OTHER): Payer: PRIVATE HEALTH INSURANCE | Admitting: Hematology and Oncology

## 2020-05-13 VITALS — BP 132/67 | HR 78 | Temp 98.7°F | Resp 18 | Ht 63.0 in | Wt 203.4 lb

## 2020-05-13 DIAGNOSIS — Z7189 Other specified counseling: Secondary | ICD-10-CM | POA: Diagnosis not present

## 2020-05-13 DIAGNOSIS — K769 Liver disease, unspecified: Secondary | ICD-10-CM

## 2020-05-13 DIAGNOSIS — C55 Malignant neoplasm of uterus, part unspecified: Secondary | ICD-10-CM

## 2020-05-13 DIAGNOSIS — D696 Thrombocytopenia, unspecified: Secondary | ICD-10-CM

## 2020-05-13 DIAGNOSIS — M069 Rheumatoid arthritis, unspecified: Secondary | ICD-10-CM | POA: Diagnosis not present

## 2020-05-13 DIAGNOSIS — C549 Malignant neoplasm of corpus uteri, unspecified: Secondary | ICD-10-CM

## 2020-05-13 MED ORDER — LIDOCAINE-PRILOCAINE 2.5-2.5 % EX CREA: TOPICAL_CREAM | CUTANEOUS | 3 refills | Status: DC

## 2020-05-13 MED ORDER — DEXAMETHASONE 4 MG PO TABS
ORAL_TABLET | ORAL | 6 refills | Status: DC
Start: 2020-05-13 — End: 2020-09-11

## 2020-05-13 MED ORDER — ONDANSETRON HCL 8 MG PO TABS: 8.0000 mg | ORAL_TABLET | Freq: Three times a day (TID) | ORAL | 1 refills | Status: DC | PRN

## 2020-05-13 MED ORDER — PROCHLORPERAZINE MALEATE 10 MG PO TABS: 10.0000 mg | ORAL_TABLET | Freq: Four times a day (QID) | ORAL | 1 refills | Status: DC | PRN

## 2020-05-13 NOTE — Progress Notes (Signed)
START ON PATHWAY REGIMEN - Uterine     A cycle is every 21 days:     Paclitaxel      Carboplatin   **Always confirm dose/schedule in your pharmacy ordering system**  Patient Characteristics: Carcinosarcoma, Newly Diagnosed, Postoperative (Pathologic Staging), Postoperative Histology: Carcinosarcoma Therapeutic Status: Newly Diagnosed, Postoperative (Pathologic Staging) AJCC M Category: cM1 AJCC 8 Stage Grouping: IVB AJCC T Category: pT1 AJCC N Category: pN0 Intent of Therapy: Curative Intent, Discussed with Patient

## 2020-05-13 NOTE — Assessment & Plan Note (Signed)
She has no recent flare of arthritis She is instructed to stop methotrexate due to ongoing plan for chemotherapy

## 2020-05-13 NOTE — Assessment & Plan Note (Signed)
We reviewed recent pathology report and CT imaging MRI of the liver is pending CT imaging studies show multiple liver lesions suspicious for liver metastasis However, benign pathology cannot be excluded If MRI show findings suspicious for metastatic disease to the liver, the goal of treatment would be considered palliative However, if MRI showed benign lesions, the goal of treatment would be of curative intent in the form of adjuvant treatment We will call her with test results after they are available Regardless of the outcome, we will need to proceed with chemotherapy as soon as possible, whether as a form of palliative intent or adjuvant treatment  We reviewed the NCCN guidelines We discussed the role of chemotherapy.  We discussed some of the risks, benefits, side-effects of carboplatin & Taxol. Treatment is intravenous, every 3 weeks x 6 cycles  Some of the short term side-effects included, though not limited to, including weight loss, life threatening infections, risk of allergic reactions, need for transfusions of blood products, nausea, vomiting, change in bowel habits, loss of hair, admission to hospital for various reasons, and risks of death.   Long term side-effects are also discussed including risks of infertility, permanent damage to nerve function, hearing loss, chronic fatigue, kidney damage with possibility needing hemodialysis, and rare secondary malignancy including bone marrow disorders.  The patient is aware that the response rates discussed earlier is not guaranteed.  After a long discussion, patient made an informed decision to proceed with the prescribed plan of care.   Patient education material was dispensed. We discussed premedication with dexamethasone before chemotherapy. I recommend port placement and chemo education class We will get her scheduled for chemotherapy to start as early as next week I will see her back prior to cycle 2 of treatment

## 2020-05-13 NOTE — Progress Notes (Signed)
[place] progress notes  Patient Care Team: [name], MD as PCP - General (Family Medicine) Buford Dresser, MD as PCP - Cardiology (Cardiology)  CHIEF COMPLAINTS/PURPOSE OF VISIT:  Newly diagnosed carcinosarcoma of the uterus, for further treatment  HISTORY OF PRESENTING ILLNESS:  Rachel Rivas 59 y.o. female was transferred to my care after recent diagnosis of uterine cancer She used to see Dr. Alen Blew for thrombocytopenia The patient developed postmenopausal bleeding leading to multiple work-up and subsequent surgery and referral here She is here accompanied by her 2 daughters today  I reviewed the patient's records extensive and collaborated the history with the patient. Summary of her history is as follows: Oncology History  Adenosarcoma of body of uterus ([place])  03/03/2020 Initial Diagnosis   She developed PMB in early April after going through menopause at approximately age 20. She saw her PCP on 4/13 for bleeding and an ultrasound was obtained showing a thickened endometrial lining and 3-4cm mass within the endocervical canal. She was referred to [facility], started on Megace and ultimately underwent a TRH/BSO on 5/18 for abnormal uterine bleeding with resulting anemia, large prolapsing fibroid.    03/06/2020 Imaging   US pelvis Markedly thickened heterogeneous, and hypervascular endometrial complex up to 38 mm thick highly concerning for endometrial neoplasm.   Additional more focal 3.0 x 2.8 x 5.3 cm diameter mass within endocervical canal, may represent extension of above endometrial pathology versus separate endocervical tumor.   Tissue diagnosis recommended.   Nonvisualization of ovaries.   04/07/2020 Pathology Results   A. UTERUS, CERVIX AND BILATERAL FALLOPIAN TUBES AND OVARIES, HYSTERECTOMY AND BILATERAL SALPINGO-OOPHORECTOMY: - Mullerian adenosarcoma of endometrium. - Tumor limited to the uterus. - Tumor invades to less than half of  the myometrium. - No involvement of adnexa. - Margins of resection are not involved. - See oncology table and comment. ONCOLOGY TABLE: UTERUS, SARCOMA: Procedure: Total hysterectomy and bilateral salpingo-oophorectomy Specimen Integrity: The uterus, cervix and bilateral fallopian tubes and ovaries were received intact. The nodule clinically identified as vaginally prolapsed uterine fibroid was received separate and disrupted. Tumor Size: Greatest dimension: 7.5 cm Histologic Type: Mullerian adenosarcoma with overgrowth of the epithelial and stromal components Histologic Grade: The epithelial component has proliferated to form endometrioid adenocarcinoma which ranges from low grade to high grade. The stromal component focally shows sarcomatous overgrowth. Myometrial Invasion: Present Depth of invasion: 4 mm Myometrial thickness: 30 mm Other Tissue/Organ Involvement: Not identified Margins: Uninvolved by sarcoma Lymphovascular Invasion: Not identified Regional Lymph Nodes: No lymph nodes submitted or found Pathologic Stage Classification (pTNM, AJCC 8th Edition): pT1b, pNX Additional Pathologic Findings: Adenomyosis. Leiomyomata.   04/07/2020 Surgery   PRE-OPERATIVE DIAGNOSIS:  Large prolapsed fibroid vaginally, menometrorrhagia with anemia   POST-OPERATIVE DIAGNOSIS:  Large prolapsed fibroid vaginally, menometrorrhagia with anemia.  Omental adhesions with anterior abdominal wall.   PROCEDURE:  Procedure(s): XI ROBOTIC TOTAL LAPAROSCOPIC HYSTERECTOMY, BILATERAL SALPINGO OOPHORECTOMY/LYSIS OF ADHESIONS/VAGINAL MYOMECTOMY    FINDINGS: Large necrotic vaginally prolapsed uterine myoma.  Uterus with small subserosal/intramural myoma.  Bilateral tubes post tubal ligation.  Bilateral normal ovaries.  Omental adhesions with anterior abdominal wall.   05/01/2020 Imaging   Ct scan of chest, abdomen and pelvis 1. Multiple ill-defined low density liver masses, suspicious for metastases. These  could be further characterized with pre and postcontrast magnetic resonance imaging of the liver. 2. Moderate diffuse vaginal wall thickening, possibly representing edema associated with the patient's recent surgery. 3. Small hiatal hernia. 4. Minimal coronary artery atheromatous calcifications. 5.  Cholelithiasis. 6. Tiny rounded area of low density in the posterior spleen, too small to characterize.   05/11/2020 Cancer Staging   Staging form: Corpus Uteri - Adenosarcoma, AJCC 8th Edition - Pathologic: Stage IVB (pT1b, pN0, cM1) - Signed by Heath Lark, MD on 05/11/2020    Since surgery, she denies further vaginal bleeding or discharge Her appetite is stable She denies abnormal bowel function since surgery She has minimum pain She has no recent flare of rheumatoid arthritis  MEDICAL HISTORY:  Past Medical History:  Diagnosis Date  . Allergy    occasionally  . Anemia    yrs ago, low platelets  . Arthritis    RA  . Blood transfusion without reported diagnosis    1985 with c section  . Borderline diabetic   . Dyspnea    While taking Megace  . GERD (gastroesophageal reflux disease)   . Hypertension   . Obstructive sleep apnea syndrome 01/07/2020  . Thrombocytopenia ([place])     SURGICAL HISTORY: Past Surgical History:  Procedure Laterality Date  . CESAREAN SECTION    . COLONOSCOPY    . MYOMECTOMY N/A 04/07/2020   Procedure: VAGINAL MYOMECTOMY;  Surgeon: Princess Bruins, MD;  Location: [facility];  Service: Gynecology;  Laterality: N/A;  . POLYPECTOMY    . ROBOTIC ASSISTED TOTAL HYSTERECTOMY WITH BILATERAL SALPINGO OOPHERECTOMY Bilateral 04/07/2020   Procedure: XI ROBOTIC TOTAL LAPAROSCOPIC HYSTERECTOMY, BILATERAL SALPINGO OOPHORECTOMY;  Surgeon: Princess Bruins, MD;  Location: [place];  Service: Gynecology;  Laterality: Bilateral;  . STERILIZATION    . TUBAL LIGATION      SOCIAL HISTORY: Social History   Socioeconomic History  .  Marital status: Married    Spouse name: Kasandra Knudsen  . Number of children: 2  . Years of education: Not on file  . Highest education level: Not on file  Occupational History  . 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 Smoking status: Former Smoker    Packs/day: 0.25    Years: 12.00    Pack years: 3.00    Types: Cigarettes  . Smokeless tobacco: Never Used  Vaping Use  . Vaping Use: Never used  Substance and Sexual Activity  . Alcohol use: Yes    Alcohol/week: 0.0 standard drinks    Comment: occasional  . Drug use: No    Comment: past; marijuana  . Sexual activity: Yes    Birth control/protection: Post-menopausal    Comment: 1st intercourse 59 yo-Fewer than 5 partners  Other Topics Concern  . Not on file  Social History Narrative   Moved here from [place] two years ago.      Works for [employer].            Social Determinants of Health   Financial Resource Strain:   . Difficulty of Paying Living Expenses:   Food Insecurity:   . Worried About Charity fundraiser in the Last Year:   . Arboriculturist in the Last Year:   Transportation Needs:   . Film/video editor (Medical):   Marland Kitchen Lack of Transportation (Non-Medical):   Physical Activity:   . Days of Exercise per Week:   . Minutes of Exercise per Session:   Stress:   . Feeling of Stress :   Social Connections:   . Frequency of Communication with Friends and Family:   . Frequency of Social Gatherings with Friends and Family:   . Attends Religious Services:   .  Active Member of Clubs or Organizations:   . Attends Archivist Meetings:   Marland Kitchen Marital Status:   Intimate Partner Violence:   . Fear of Current or Ex-Partner:   . Emotionally Abused:   Marland Kitchen Physically Abused:   . Sexually Abused:     FAMILY HISTORY: Family History  Problem Relation Age of Onset  . Lung cancer Mother   . Hypertension Mother   . Diabetes Mother   . Uterine cancer Mother   . Cancer  Mother        endometrial  . Hypertension Father   . Diabetes Father   . Stroke Father   . Colon cancer Neg Hx   . Colon polyps Neg Hx   . Esophageal cancer Neg Hx   . Rectal cancer Neg Hx   . Stomach cancer Neg Hx     ALLERGIES:  is allergic to latex and sulfur.  MEDICATIONS:  Current Outpatient Medications  Medication Sig Dispense Refill  . cyclobenzaprine (FLEXERIL) 10 MG tablet Take 1 tablet (10 mg total) by mouth at bedtime as needed for muscle spasms. 15 tablet 0  . dexamethasone (DECADRON) 4 MG tablet Take 2 tabs at the night before and 2 tab the morning of chemotherapy, every 3 weeks, by mouth x 6 cycles 36 tablet 6  . famotidine (PEPCID) 20 MG tablet TAKE 1 TABLET BY MOUTH TWICE DAILY FOR HEART 180 tablet 1  . hydrochlorothiazide (MICROZIDE) 12.5 MG capsule Take 1 capsule by mouth once daily 90 capsule 1  . lidocaine-prilocaine (EMLA) cream Apply to affected area once 30 g 3  . losartan (COZAAR) 50 MG tablet Take 1 tablet by mouth once daily 90 tablet 1  . ondansetron (ZOFRAN) 8 MG tablet Take 1 tablet (8 mg total) by mouth every 8 (eight) hours as needed. 30 tablet 1  . prochlorperazine (COMPAZINE) 10 MG tablet Take 1 tablet (10 mg total) by mouth every 6 (six) hours as needed (Nausea or vomiting). 30 tablet 1   No current facility-administered medications for this visit.    REVIEW OF SYSTEMS:   Constitutional: Denies fevers, chills or abnormal night sweats Eyes: Denies blurriness of vision, double vision or watery eyes Ears, nose, mouth, throat, and face: Denies mucositis or sore throat Respiratory: Denies cough, dyspnea or wheezes Cardiovascular: Denies palpitation, chest discomfort or lower extremity swelling Gastrointestinal:  Denies nausea, heartburn or change in bowel habits Skin: Denies abnormal skin rashes Lymphatics: Denies new lymphadenopathy or easy bruising Neurological:Denies numbness, tingling or new weaknesses Behavioral/Psych: Mood is stable, no new  changes  All other systems were reviewed with the patient and are negative.  PHYSICAL EXAMINATION: ECOG PERFORMANCE STATUS: 1 - Symptomatic but completely ambulatory  Vitals:   05/13/20 0854  BP: 132/67  Pulse: 78  Resp: 18  Temp: 98.7 F (37.1 C)  SpO2: 100%   Filed Weights   05/13/20 0854  Weight: 203 lb 6.4 oz (92.3 kg)    GENERAL:alert, no distress and comfortable SKIN: skin color, texture, turgor are normal, no rashes or significant lesions EYES: normal, conjunctiva are pink and non-injected, sclera clear OROPHARYNX:no exudate, normal lips, buccal mucosa, and tongue  NECK: supple, thyroid normal size, non-tender, without nodularity LYMPH:  no palpable lymphadenopathy in the cervical, axillary or inguinal LUNGS: clear to auscultation and percussion with normal breathing effort HEART: regular rate & rhythm and no murmurs without lower extremity edema ABDOMEN:abdomen soft, non-tender and normal bowel sounds.  Noted well-healed surgical scar Musculoskeletal:no cyanosis of digits and no  clubbing  PSYCH: alert & oriented x 3 with fluent speech NEURO: no focal motor/sensory deficits  LABORATORY DATA:  I have reviewed the data as listed Lab Results  Component Value Date   WBC 6.6 04/29/2020   HGB 10.2 (L) 04/29/2020   HCT 33.0 (L) 04/29/2020   MCV 88.5 04/29/2020   PLT 153 04/29/2020   Recent Labs    12/30/19 0738 12/30/19 0738 03/14/20 2032 03/26/20 1205 04/24/20 1059  NA 139   < > 139 139 142  K 3.7   < > 3.3* 3.9 3.5  CL 101   < > 101 103 104  CO2 31   < > 27 26 25   GLUCOSE 114*   < > 125* 92 114*  BUN 20   < > 15 23* 15  CREATININE 0.89   < > 0.84 0.96 1.10*  CALCIUM 9.6   < > 9.6 9.1 10.1  GFRNONAA  --   --  >60 >60 55*  GFRAA  --   --  >60 >60 >60  PROT 7.8  --  8.2*  --   --   ALBUMIN 4.5  --  4.7  --   --   AST 18  --  16  --   --   ALT 18  --  20  --   --   ALKPHOS 69  --  68  --   --   BILITOT 0.7  --  0.7  --   --    < > = values in this  interval not displayed.    RADIOGRAPHIC STUDIES: I have personally reviewed the radiological images as listed and agreed with the findings in the report. CT CHEST W CONTRAST  Result Date: 05/01/2020 CLINICAL DATA:  Newly diagnosed cervical/uterine cancer. Status post complete hysterectomy on 04/04/2020. EXAM: CT CHEST, ABDOMEN, AND PELVIS WITH CONTRAST TECHNIQUE: Multidetector CT imaging of the chest, abdomen and pelvis was performed following the standard protocol during bolus administration of intravenous contrast. CONTRAST:  137mL OMNIPAQUE IOHEXOL 300 MG/ML  SOLN COMPARISON:  None. FINDINGS: CT CHEST FINDINGS Cardiovascular: Minimal coronary artery calcifications. Normal sized heart. Mediastinum/Nodes: Several subcentimeter thyroid nodules. Not clinically significant; no follow-up imaging recommended (ref: J Am Coll Radiol. 2015 Feb;12(2): 143-50). Lungs/Pleura: Lungs are clear. No pleural effusion or pneumothorax. Musculoskeletal: Lower cervical spine degenerative changes and minimal thoracic spine degenerative changes. Approximately 20% central posterior wedge defect involving the T5 vertebral body with no acute fracture lines or bony retropulsion. No evidence of bony metastatic disease. CT ABDOMEN PELVIS FINDINGS Hepatobiliary: Multiple ill-defined low density liver masses. The largest is in the posterior segment of the right lobe, measuring 2.0 x 1.5 cm on image number 50 series 2. Poorly distended gallbladder with at least 1 stone with peripheral wall calcifications. This measures 2.5 cm in maximum diameter. No gallbladder wall thickening or pericholecystic fluid. Pancreas: Unremarkable. No pancreatic ductal dilatation or surrounding inflammatory changes. Spleen: Tiny rounded area of low density in the posterior spleen on image number 55 series 2. Otherwise, normal. Adrenals/Urinary Tract: Normal appearing adrenal glands. Tiny cyst in the lateral aspect of the mid to lower right kidney. Unremarkable  ureters and urinary bladder. Stomach/Bowel: Small hiatal hernia. Mildly prominent stool in the colon. Unremarkable small bowel. Several tiny appendicoliths in an otherwise normal appearing appendix. Vascular/Lymphatic: No significant vascular findings are present. No enlarged abdominal or pelvic lymph nodes. Reproductive: Status post hysterectomy. No adnexal masses. Moderate diffuse vaginal wall thickening. Small amount of fluid with angulated margins  in the uterine bed, compatible with a small amount of residual postoperative fluid. Other: No abdominal wall hernia seen. Musculoskeletal: L4-5 degenerative changes. L5-S1 facet degenerative changes. No evidence of bony metastatic disease. IMPRESSION: 1. Multiple ill-defined low density liver masses, suspicious for metastases. These could be further characterized with pre and postcontrast magnetic resonance imaging of the liver. 2. Moderate diffuse vaginal wall thickening, possibly representing edema associated with the patient's recent surgery. 3. Small hiatal hernia. 4. Minimal coronary artery atheromatous calcifications. 5. Cholelithiasis. 6. Tiny rounded area of low density in the posterior spleen, too small to characterize. Electronically Signed   By: Claudie Revering M.D.   On: 05/01/2020 16:50   CT Abdomen Pelvis W Contrast  Result Date: 05/01/2020 CLINICAL DATA:  Newly diagnosed cervical/uterine cancer. Status post complete hysterectomy on 04/04/2020. EXAM: CT CHEST, ABDOMEN, AND PELVIS WITH CONTRAST TECHNIQUE: Multidetector CT imaging of the chest, abdomen and pelvis was performed following the standard protocol during bolus administration of intravenous contrast. CONTRAST:  182mL OMNIPAQUE IOHEXOL 300 MG/ML  SOLN COMPARISON:  None. FINDINGS: CT CHEST FINDINGS Cardiovascular: Minimal coronary artery calcifications. Normal sized heart. Mediastinum/Nodes: Several subcentimeter thyroid nodules. Not clinically significant; no follow-up imaging recommended (ref: J  Am Coll Radiol. 2015 Feb;12(2): 143-50). Lungs/Pleura: Lungs are clear. No pleural effusion or pneumothorax. Musculoskeletal: Lower cervical spine degenerative changes and minimal thoracic spine degenerative changes. Approximately 20% central posterior wedge defect involving the T5 vertebral body with no acute fracture lines or bony retropulsion. No evidence of bony metastatic disease. CT ABDOMEN PELVIS FINDINGS Hepatobiliary: Multiple ill-defined low density liver masses. The largest is in the posterior segment of the right lobe, measuring 2.0 x 1.5 cm on image number 50 series 2. Poorly distended gallbladder with at least 1 stone with peripheral wall calcifications. This measures 2.5 cm in maximum diameter. No gallbladder wall thickening or pericholecystic fluid. Pancreas: Unremarkable. No pancreatic ductal dilatation or surrounding inflammatory changes. Spleen: Tiny rounded area of low density in the posterior spleen on image number 55 series 2. Otherwise, normal. Adrenals/Urinary Tract: Normal appearing adrenal glands. Tiny cyst in the lateral aspect of the mid to lower right kidney. Unremarkable ureters and urinary bladder. Stomach/Bowel: Small hiatal hernia. Mildly prominent stool in the colon. Unremarkable small bowel. Several tiny appendicoliths in an otherwise normal appearing appendix. Vascular/Lymphatic: No significant vascular findings are present. No enlarged abdominal or pelvic lymph nodes. Reproductive: Status post hysterectomy. No adnexal masses. Moderate diffuse vaginal wall thickening. Small amount of fluid with angulated margins in the uterine bed, compatible with a small amount of residual postoperative fluid. Other: No abdominal wall hernia seen. Musculoskeletal: L4-5 degenerative changes. L5-S1 facet degenerative changes. No evidence of bony metastatic disease. IMPRESSION: 1. Multiple ill-defined low density liver masses, suspicious for metastases. These could be further characterized with pre  and postcontrast magnetic resonance imaging of the liver. 2. Moderate diffuse vaginal wall thickening, possibly representing edema associated with the patient's recent surgery. 3. Small hiatal hernia. 4. Minimal coronary artery atheromatous calcifications. 5. Cholelithiasis. 6. Tiny rounded area of low density in the posterior spleen, too small to characterize. Electronically Signed   By: Claudie Revering M.D.   On: 05/01/2020 16:50    ASSESSMENT & PLAN:  Adenosarcoma of body of uterus ([place]) We reviewed recent pathology report and CT imaging MRI of the liver is pending CT imaging studies show multiple liver lesions suspicious for liver metastasis However, benign pathology cannot be excluded If MRI show findings suspicious for metastatic disease to the liver, the goal  of treatment would be considered palliative However, if MRI showed benign lesions, the goal of treatment would be of curative intent in the form of adjuvant treatment We will call her with test results after they are available Regardless of the outcome, we will need to proceed with chemotherapy as soon as possible, whether as a form of palliative intent or adjuvant treatment  We reviewed the NCCN guidelines We discussed the role of chemotherapy.  We discussed some of the risks, benefits, side-effects of carboplatin & Taxol. Treatment is intravenous, every 3 weeks x 6 cycles  Some of the short term side-effects included, though not limited to, including weight loss, life threatening infections, risk of allergic reactions, need for transfusions of blood products, nausea, vomiting, change in bowel habits, loss of hair, admission to hospital for various reasons, and risks of death.   Long term side-effects are also discussed including risks of infertility, permanent damage to nerve function, hearing loss, chronic fatigue, kidney damage with possibility needing hemodialysis, and rare secondary malignancy including bone marrow  disorders.  The patient is aware that the response rates discussed earlier is not guaranteed.  After a long discussion, patient made an informed decision to proceed with the prescribed plan of care.   Patient education material was dispensed. We discussed premedication with dexamethasone before chemotherapy. I recommend port placement and chemo education class We will get her scheduled for chemotherapy to start as early as next week I will see her back prior to cycle 2 of treatment  Rheumatoid arthritis ([place]) She has no recent flare of arthritis She is instructed to stop methotrexate due to ongoing plan for chemotherapy  Thrombocytopenia ([place]) This is likely in the setting of autoimmune condition We will discontinue follow-up with Dr. Alen Blew and she will see me in the future  Goals of care, counseling/discussion We discussed the inadequate staging information It is not clear whether the goal of treatment is in the palliative setting or adjuvant, depending on outcome of MRI findings Regardless of MRI findings, she needs to start chemotherapy as soon as possible They are in agreement to proceed   Orders Placed This Encounter  Procedures  . IR IMAGING GUIDED PORT INSERTION    Standing Status:   Future    Standing Expiration Date:   05/13/2021    Order Specific Question:   Reason for Exam (SYMPTOM  OR DIAGNOSIS REQUIRED)    Answer:   need port for chemo to start 7/2    Order Specific Question:   Is the patient pregnant?    Answer:   No    Order Specific Question:   Preferred Imaging Location?    Answer:   [facility]  . CBC with Differential ([place] Only)    Standing Status:   Standing    Number of Occurrences:   20    Standing Expiration Date:   05/13/2021  . CMP ([place] only)    Standing Status:   Standing    Number of Occurrences:   20    Standing Expiration Date:   05/13/2021    All questions were answered. The patient knows to call the clinic with any  problems, questions or concerns. The total time spent in the appointment was 80 minutes encounter with patients including review of chart and various tests results, discussions about plan of care and coordination of care plan   Heath Lark, MD 05/13/2020 10:27 AM

## 2020-05-13 NOTE — Assessment & Plan Note (Signed)
This is likely in the setting of autoimmune condition We will discontinue follow-up with Dr. Alen Blew and she will see me in the future

## 2020-05-13 NOTE — Assessment & Plan Note (Signed)
We discussed the inadequate staging information It is not clear whether the goal of treatment is in the palliative setting or adjuvant, depending on outcome of MRI findings Regardless of MRI findings, she needs to start chemotherapy as soon as possible They are in agreement to proceed

## 2020-05-14 ENCOUNTER — Telehealth: Payer: Self-pay | Admitting: Oncology

## 2020-05-14 ENCOUNTER — Encounter (HOSPITAL_COMMUNITY): Payer: Self-pay | Admitting: Hematology and Oncology

## 2020-05-14 ENCOUNTER — Ambulatory Visit (HOSPITAL_COMMUNITY)
Admission: RE | Admit: 2020-05-14 | Discharge: 2020-05-14 | Disposition: A | Discharge status: Home / Self Care | Payer: PRIVATE HEALTH INSURANCE | Source: Ambulatory Visit | Attending: Gynecologic Oncology | Admitting: Gynecologic Oncology

## 2020-05-14 DIAGNOSIS — C55 Malignant neoplasm of uterus, part unspecified: Secondary | ICD-10-CM | POA: Insufficient documentation

## 2020-05-14 DIAGNOSIS — C549 Malignant neoplasm of corpus uteri, unspecified: Secondary | ICD-10-CM

## 2020-05-14 MED ORDER — GADOBUTROL 1 MMOL/ML IV SOLN
9.0000 mL | Freq: Once | INTRAVENOUS | Status: AC | PRN
  Administered 2020-05-14: 9 mL via INTRAVENOUS

## 2020-05-14 NOTE — Telephone Encounter (Signed)
Left a message for Rachel Rivas regarding chemo start date.  Requested a return call.

## 2020-05-14 NOTE — Telephone Encounter (Signed)
Asked Ayriana if she would like to have chemotherapy on 7/2 with a peripheral IV or if she would rather have a port placed that day and start chemo on 7/9.  She said she would prefer to get the port and start chemo on 7/9.  She said it is difficult to get an IV on her because she can only use one arm so she prefers to get the port first.

## 2020-05-15 ENCOUNTER — Other Ambulatory Visit: Payer: Self-pay | Admitting: Hematology and Oncology

## 2020-05-15 NOTE — Telephone Encounter (Signed)
I sent new scheduling msg 

## 2020-05-15 NOTE — Telephone Encounter (Signed)
Rachel Rivas with message below from Dr. Alvy Bimler.  She would prefer to have the second cycle on 06/19/20.

## 2020-05-15 NOTE — Telephone Encounter (Signed)
1) Please let her know MRI is benign 2) I can send new scheduling msg to start 7/9 3) Her next Rx is on 7/30, I am out of town, I can see her on 7/27 and keep her chemo on 7/30 or we can delay chemo until 8/2. Let me know her preference and I will resend scheduling msg

## 2020-05-18 ENCOUNTER — Encounter: Payer: Self-pay | Admitting: Oncology

## 2020-05-18 ENCOUNTER — Telehealth: Payer: Self-pay | Admitting: Hematology and Oncology

## 2020-05-18 NOTE — Telephone Encounter (Signed)
Scheduled per 6/25 sch message. Pt is aware of appts

## 2020-05-20 ENCOUNTER — Encounter: Payer: Self-pay | Admitting: Oncology

## 2020-05-20 NOTE — Progress Notes (Signed)
FMLA paperwork for Aleathea's daughter has been faxed to the [employer] at [phone] and emailed to [place] at [email or web address].

## 2020-05-21 ENCOUNTER — Other Ambulatory Visit: Payer: Self-pay | Admitting: Student

## 2020-05-21 ENCOUNTER — Other Ambulatory Visit: Payer: Self-pay | Admitting: Radiology

## 2020-05-22 ENCOUNTER — Encounter (HOSPITAL_COMMUNITY): Payer: Self-pay

## 2020-05-22 ENCOUNTER — Ambulatory Visit (HOSPITAL_COMMUNITY)
Admission: RE | Admit: 2020-05-22 | Discharge: 2020-05-22 | Disposition: A | Discharge status: Home / Self Care | Payer: PRIVATE HEALTH INSURANCE | Source: Ambulatory Visit | Attending: Hematology and Oncology | Admitting: Hematology and Oncology

## 2020-05-22 ENCOUNTER — Other Ambulatory Visit: Payer: Self-pay | Admitting: Hematology and Oncology

## 2020-05-22 ENCOUNTER — Other Ambulatory Visit: Payer: Self-pay

## 2020-05-22 DIAGNOSIS — M069 Rheumatoid arthritis, unspecified: Secondary | ICD-10-CM | POA: Insufficient documentation

## 2020-05-22 DIAGNOSIS — Z882 Allergy status to sulfonamides status: Secondary | ICD-10-CM | POA: Diagnosis not present

## 2020-05-22 DIAGNOSIS — Z808 Family history of malignant neoplasm of other organs or systems: Secondary | ICD-10-CM | POA: Diagnosis not present

## 2020-05-22 DIAGNOSIS — Z90722 Acquired absence of ovaries, bilateral: Secondary | ICD-10-CM | POA: Diagnosis not present

## 2020-05-22 DIAGNOSIS — Z90710 Acquired absence of both cervix and uterus: Secondary | ICD-10-CM | POA: Insufficient documentation

## 2020-05-22 DIAGNOSIS — Z79899 Other long term (current) drug therapy: Secondary | ICD-10-CM | POA: Insufficient documentation

## 2020-05-22 DIAGNOSIS — Z801 Family history of malignant neoplasm of trachea, bronchus and lung: Secondary | ICD-10-CM | POA: Diagnosis not present

## 2020-05-22 DIAGNOSIS — Z833 Family history of diabetes mellitus: Secondary | ICD-10-CM | POA: Diagnosis not present

## 2020-05-22 DIAGNOSIS — K219 Gastro-esophageal reflux disease without esophagitis: Secondary | ICD-10-CM | POA: Diagnosis not present

## 2020-05-22 DIAGNOSIS — C55 Malignant neoplasm of uterus, part unspecified: Secondary | ICD-10-CM | POA: Insufficient documentation

## 2020-05-22 DIAGNOSIS — Z91040 Latex allergy status: Secondary | ICD-10-CM | POA: Insufficient documentation

## 2020-05-22 DIAGNOSIS — Z87891 Personal history of nicotine dependence: Secondary | ICD-10-CM | POA: Insufficient documentation

## 2020-05-22 DIAGNOSIS — I10 Essential (primary) hypertension: Secondary | ICD-10-CM | POA: Diagnosis not present

## 2020-05-22 DIAGNOSIS — G4733 Obstructive sleep apnea (adult) (pediatric): Secondary | ICD-10-CM | POA: Insufficient documentation

## 2020-05-22 DIAGNOSIS — R7303 Prediabetes: Secondary | ICD-10-CM | POA: Insufficient documentation

## 2020-05-22 DIAGNOSIS — Z8249 Family history of ischemic heart disease and other diseases of the circulatory system: Secondary | ICD-10-CM | POA: Diagnosis not present

## 2020-05-22 DIAGNOSIS — C549 Malignant neoplasm of corpus uteri, unspecified: Secondary | ICD-10-CM

## 2020-05-22 HISTORY — PX: IR IMAGING GUIDED PORT INSERTION: IMG5740

## 2020-05-22 LAB — BASIC METABOLIC PANEL
Anion gap: 10 (ref 5–15)
BUN: 15 mg/dL (ref 6–20)
CO2: 28 mmol/L (ref 22–32)
Calcium: 9.5 mg/dL (ref 8.9–10.3)
Chloride: 102 mmol/L (ref 98–111)
Creatinine, Ser: 0.83 mg/dL (ref 0.44–1.00)
GFR calc Af Amer: >60 mL/min (ref >60)
GFR calc non Af Amer: >60 mL/min (ref >60)
Glucose, Bld: 136 mg/dL — ABNORMAL HIGH (ref 70–99)
Potassium: 3.4 mmol/L — ABNORMAL LOW (ref 3.5–5.1)
Sodium: 140 mmol/L (ref 135–145)

## 2020-05-22 LAB — CBC WITH DIFFERENTIAL/PLATELET
Abs Immature Granulocytes: 0.01 10*3/uL (ref 0.00–0.07)
Basophils Absolute: 0 10*3/uL (ref 0.0–0.1)
Basophils Relative: 0 %
Eosinophils Absolute: 0.1 10*3/uL (ref 0.0–0.5)
Eosinophils Relative: 2 %
HCT: 34 % — ABNORMAL LOW (ref 36.0–46.0)
Hemoglobin: 10.5 g/dL — ABNORMAL LOW (ref 12.0–15.0)
Immature Granulocytes: 0 %
Lymphocytes Relative: 34 %
Lymphs Abs: 2.4 10*3/uL (ref 0.7–4.0)
MCH: 27 pg (ref 26.0–34.0)
MCHC: 30.9 g/dL (ref 30.0–36.0)
MCV: 87.4 fL (ref 80.0–100.0)
Monocytes Absolute: 0.6 10*3/uL (ref 0.1–1.0)
Monocytes Relative: 8 %
Neutro Abs: 3.9 10*3/uL (ref 1.7–7.7)
Neutrophils Relative %: 56 %
Platelets: 118 10*3/uL — ABNORMAL LOW (ref 150–400)
RBC: 3.89 MIL/uL (ref 3.87–5.11)
RDW: 14.4 % (ref 11.5–15.5)
WBC: 6.9 10*3/uL (ref 4.0–10.5)
nRBC: 0 % (ref 0.0–0.2)

## 2020-05-22 LAB — GLUCOSE, CAPILLARY: Glucose-Capillary: 136 mg/dL — ABNORMAL HIGH (ref 70–99)

## 2020-05-22 LAB — PROTIME-INR
INR: 1 (ref 0.8–1.2)
Prothrombin Time: 12.4 seconds (ref 11.4–15.2)

## 2020-05-22 MED ORDER — LIDOCAINE HCL 1 % IJ SOLN
INTRAMUSCULAR | Status: AC
  Filled 2020-05-22: qty 20

## 2020-05-22 MED ORDER — HEPARIN SOD (PORK) LOCK FLUSH 100 UNIT/ML IV SOLN
INTRAVENOUS | Status: AC | PRN
  Administered 2020-05-22: 500 [IU] via INTRAVENOUS

## 2020-05-22 MED ORDER — LIDOCAINE-EPINEPHRINE 1 %-1:100000 IJ SOLN
INTRAMUSCULAR | Status: AC | PRN
  Administered 2020-05-22: 10 mL via INTRADERMAL

## 2020-05-22 MED ORDER — MIDAZOLAM HCL 2 MG/2ML IJ SOLN
INTRAMUSCULAR | Status: AC
  Filled 2020-05-22: qty 2

## 2020-05-22 MED ORDER — SODIUM CHLORIDE 0.9 % IV SOLN: INTRAVENOUS | Status: DC

## 2020-05-22 MED ORDER — MIDAZOLAM HCL 2 MG/2ML IJ SOLN
INTRAMUSCULAR | Status: AC | PRN
  Administered 2020-05-22 (×6): 1 mg via INTRAVENOUS

## 2020-05-22 MED ORDER — FENTANYL CITRATE (PF) 100 MCG/2ML IJ SOLN
INTRAMUSCULAR | Status: AC
  Filled 2020-05-22: qty 2

## 2020-05-22 MED ORDER — CEFAZOLIN SODIUM-DEXTROSE 2-4 GM/100ML-% IV SOLN: 2.0000 g | INTRAVENOUS | Status: AC

## 2020-05-22 MED ORDER — MIDAZOLAM HCL 2 MG/2ML IJ SOLN
INTRAMUSCULAR | Status: AC
  Filled 2020-05-22: qty 4

## 2020-05-22 MED ORDER — LIDOCAINE HCL (PF) 1 % IJ SOLN
INTRAMUSCULAR | Status: AC | PRN
  Administered 2020-05-22: 10 mL via INTRADERMAL

## 2020-05-22 MED ORDER — FENTANYL CITRATE (PF) 100 MCG/2ML IJ SOLN
INTRAMUSCULAR | Status: AC | PRN
  Administered 2020-05-22 (×4): 50 ug via INTRAVENOUS

## 2020-05-22 MED ORDER — LIDOCAINE-EPINEPHRINE 1 %-1:100000 IJ SOLN
INTRAMUSCULAR | Status: AC
  Filled 2020-05-22: qty 1

## 2020-05-22 MED ORDER — HEPARIN SOD (PORK) LOCK FLUSH 100 UNIT/ML IV SOLN
INTRAVENOUS | Status: AC
  Filled 2020-05-22: qty 5

## 2020-05-22 MED ORDER — CEFAZOLIN SODIUM-DEXTROSE 2-4 GM/100ML-% IV SOLN
INTRAVENOUS | Status: AC
  Administered 2020-05-22: 2 g via INTRAVENOUS
  Filled 2020-05-22: qty 100

## 2020-05-22 NOTE — Discharge Instructions (Signed)
Please call Interventional Radiology clinic [phone] with any questions or concerns.  You may remove your dressing and shower tomorrow.  DO NOT use EMLA cream for 2 weeks after port placement as this cream will remove surgical glue on your incision.   Implanted Port Insertion, Care After This sheet gives you information about how to care for yourself after your procedure. Your health care provider may also give you more specific instructions. If you have problems or questions, contact your health care provider. What can I expect after the procedure? After the procedure, it is common to have:  Discomfort at the port insertion site.  Bruising on the skin over the port. This should improve over 3-4 days. Follow these instructions at home: Port care  After your port is placed, you will get a manufacturer's information card. The card has information about your port. Keep this card with you at all times.  Take care of the port as told by your health care provider. Ask your health care provider if you or a family member can get training for taking care of the port at home. A home health care nurse may also take care of the port.  Make sure to remember what type of port you have. Incision care      Follow instructions from your health care provider about how to take care of your port insertion site. Make sure you: ? Wash your hands with soap and water before and after you change your bandage (dressing). If soap and water are not available, use hand sanitizer. ? Change your dressing as told by your health care provider. ? Leave stitches (sutures), skin glue, or adhesive strips in place. These skin closures may need to stay in place for 2 weeks or longer. If adhesive strip edges start to loosen and curl up, you may trim the loose edges. Do not remove adhesive strips completely unless your health care provider tells you to do that.  Check your port insertion site every day for signs of  infection. Check for: ? Redness, swelling, or pain. ? Fluid or blood. ? Warmth. ? Pus or a bad smell. Activity  Return to your normal activities as told by your health care provider. Ask your health care provider what activities are safe for you.  Do not lift anything that is heavier than 10 lb (4.5 kg), or the limit that you are told, until your health care provider says that it is safe. General instructions  Take over-the-counter and prescription medicines only as told by your health care provider.  Do not take baths, swim, or use a hot tub until your health care provider approves. Ask your health care provider if you may take showers. You may only be allowed to take sponge baths.  Do not drive for 24 hours if you were given a sedative during your procedure.  Wear a medical alert bracelet in case of an emergency. This will tell any health care providers that you have a port.  Keep all follow-up visits as told by your health care provider. This is important. Contact a health care provider if:  You cannot flush your port with saline as directed, or you cannot draw blood from the port.  You have a fever or chills.  You have redness, swelling, or pain around your port insertion site.  You have fluid or blood coming from your port insertion site.  Your port insertion site feels warm to the touch.  You have pus or a bad   smell coming from the port insertion site. Get help right away if:  You have chest pain or shortness of breath.  You have bleeding from your port that you cannot control. Summary  Take care of the port as told by your health care provider. Keep the manufacturer's information card with you at all times.  Change your dressing as told by your health care provider.  Contact a health care provider if you have a fever or chills or if you have redness, swelling, or pain around your port insertion site.  Keep all follow-up visits as told by your health care  provider. This information is not intended to replace advice given to you by your health care provider. Make sure you discuss any questions you have with your health care provider. Document Revised: 06/05/2018 Document Reviewed: 06/05/2018 Elsevier Patient Education  [place].   Moderate Conscious Sedation, Adult Sedation is the use of medicines to promote relaxation and relieve discomfort and anxiety. Moderate conscious sedation is a type of sedation. Under moderate conscious sedation, you are less alert than normal, but you are still able to respond to instructions, touch, or both. Moderate conscious sedation is used during short medical and dental procedures. It is milder than deep sedation, which is a type of sedation under which you cannot be easily woken up. It is also milder than general anesthesia, which is the use of medicines to make you unconscious. Moderate conscious sedation allows you to return to your regular activities sooner. Tell a health care provider about:  Any allergies you have.  All medicines you are taking, including vitamins, herbs, eye drops, creams, and over-the-counter medicines.  Use of steroids (by mouth or creams).  Any problems you or family members have had with sedatives and anesthetic medicines.  Any blood disorders you have.  Any surgeries you have had.  Any medical conditions you have, such as sleep apnea.  Whether you are pregnant or may be pregnant.  Any use of cigarettes, alcohol, marijuana, or street drugs. What are the risks? Generally, this is a safe procedure. However, problems may occur, including:  Getting too much medicine (oversedation).  Nausea.  Allergic reaction to medicines.  Trouble breathing. If this happens, a breathing tube may be used to help with breathing. It will be removed when you are awake and breathing on your own.  Heart trouble.  Lung trouble. What happens before the procedure? Staying  hydrated Follow instructions from your health care provider about hydration, which may include:  Up to 2 hours before the procedure - you may continue to drink clear liquids, such as water, clear fruit juice, black coffee, and plain tea. Eating and drinking restrictions Follow instructions from your health care provider about eating and drinking, which may include:  8 hours before the procedure - stop eating heavy meals or foods such as meat, fried foods, or fatty foods.  6 hours before the procedure - stop eating light meals or foods, such as toast or cereal.  6 hours before the procedure - stop drinking milk or drinks that contain milk.  2 hours before the procedure - stop drinking clear liquids. Medicine Ask your health care provider about:  Changing or stopping your regular medicines. This is especially important if you are taking diabetes medicines or blood thinners.  Taking medicines such as aspirin and ibuprofen. These medicines can thin your blood. Do not take these medicines before your procedure if your health care provider instructs you not to.  Tests  and exams  You will have a physical exam.  You may have blood tests done to show: ? How well your kidneys and liver are working. ? How well your blood can clot. General instructions  Plan to have someone take you home from the hospital or clinic.  If you will be going home right after the procedure, plan to have someone with you for 24 hours. What happens during the procedure?  An IV tube will be inserted into one of your veins.  Medicine to help you relax (sedative) will be given through the IV tube.  The medical or dental procedure will be performed. What happens after the procedure?  Your blood pressure, heart rate, breathing rate, and blood oxygen level will be monitored often until the medicines you were given have worn off.  Do not drive for 24 hours. This information is not intended to replace advice given  to you by your health care provider. Make sure you discuss any questions you have with your health care provider. Document Revised: 10/20/2017 Document Reviewed: 02/27/2016 Elsevier Patient Education  2020 [employer].

## 2020-05-22 NOTE — Consult Note (Signed)
Chief Complaint: Patient was seen in consultation today for Port-A-Cath placement  Referring Physician(s): Gorsuch,Ni  Supervising Physician: Markus Daft  Patient Status: [facility] - Out[facility]  History of Present Illness: [facility] is a 59 y.o. female with history of newly diagnosed carcinosarcoma of the uterus and prior hysterectomy on 04/07/2020 who presents today for Port-A-Cath placement for chemotherapy.  Additional medical history as below.    Past Medical History:  Diagnosis Date  . Allergy    occasionally  . Anemia    yrs ago, low platelets  . Arthritis    RA  . Blood transfusion without reported diagnosis    1985 with c section  . Borderline diabetic   . Dyspnea    While taking Megace  . GERD (gastroesophageal reflux disease)   . Hypertension   . Obstructive sleep apnea syndrome 01/07/2020  . Thrombocytopenia ([place])     Past Surgical History:  Procedure Laterality Date  . CESAREAN SECTION    . COLONOSCOPY    . MYOMECTOMY N/A 04/07/2020   Procedure: VAGINAL MYOMECTOMY;  Surgeon: Princess Bruins, MD;  Location: [facility];  Service: Gynecology;  Laterality: N/A;  . POLYPECTOMY    . ROBOTIC ASSISTED TOTAL HYSTERECTOMY WITH BILATERAL SALPINGO OOPHERECTOMY Bilateral 04/07/2020   Procedure: XI ROBOTIC TOTAL LAPAROSCOPIC HYSTERECTOMY, BILATERAL SALPINGO OOPHORECTOMY;  Surgeon: Princess Bruins, MD;  Location: [facility];  Service: Gynecology;  Laterality: Bilateral;  . STERILIZATION    . TUBAL LIGATION      Allergies: Latex and Sulfur  Medications: Prior to Admission medications   Medication Sig Start Date End Date Taking? Authorizing Provider  famotidine (PEPCID) 20 MG tablet TAKE 1 TABLET BY MOUTH TWICE DAILY FOR HEART 05/08/20  Yes Bedsole, Amy E, MD  hydrochlorothiazide (MICROZIDE) 12.5 MG capsule Take 1 capsule by mouth once daily 03/27/20  Yes Bedsole, Amy E, MD  losartan (COZAAR) 50 MG tablet Take 1 tablet by mouth once  daily 02/13/20  Yes Bedsole, Amy E, MD  cyclobenzaprine (FLEXERIL) 10 MG tablet Take 1 tablet (10 mg total) by mouth at bedtime as needed for muscle spasms. 08/29/19   Bedsole, Amy E, MD  dexamethasone (DECADRON) 4 MG tablet Take 2 tabs at the night before and 2 tab the morning of chemotherapy, every 3 weeks, by mouth x 6 cycles 05/13/20   Heath Lark, MD  lidocaine-prilocaine (EMLA) cream Apply to affected area once 05/13/20   Heath Lark, MD  ondansetron (ZOFRAN) 8 MG tablet Take 1 tablet (8 mg total) by mouth every 8 (eight) hours as needed. 05/13/20   Heath Lark, MD  prochlorperazine (COMPAZINE) 10 MG tablet Take 1 tablet (10 mg total) by mouth every 6 (six) hours as needed (Nausea or vomiting). 05/13/20   Heath Lark, MD     Family History  Problem Relation Age of Onset  . Lung cancer Mother   . Hypertension Mother   . Diabetes Mother   . Uterine cancer Mother   . Cancer Mother        endometrial  . Hypertension Father   . Diabetes Father   . Stroke Father   . Colon cancer Neg Hx   . Colon polyps Neg Hx   . Esophageal cancer Neg Hx   . Rectal cancer Neg Hx   . Stomach cancer Neg Hx     Social History   Socioeconomic History  . Marital status: Married    Spouse name: Rachel Rivas  . Number of children: 2  . Years of  education: Not on file  . Highest education level: Not on file  Occupational History  . 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 Smoking status: Former Smoker    Packs/day: 0.25    Years: 12.00    Pack years: 3.00    Types: Cigarettes  . Smokeless tobacco: Never Used  Vaping Use  . Vaping Use: Never used  Substance and Sexual Activity  . Alcohol use: Yes    Alcohol/week: 0.0 standard drinks    Comment: occasional  . Drug use: No    Comment: past; marijuana  . Sexual activity: Yes    Birth control/protection: Post-menopausal    Comment: 1st intercourse 59 yo-Fewer than 5 partners  Other Topics Concern  . Not on file    Social History Narrative   Moved here from [place] two years ago.      Works for [employer].            Social Determinants of Health   Financial Resource Strain:   . Difficulty of Paying Living Expenses:   Food Insecurity:   . Worried About Charity fundraiser in the Last Year:   . Arboriculturist in the Last Year:   Transportation Needs:   . Film/video editor (Medical):   Marland Kitchen Lack of Transportation (Non-Medical):   Physical Activity:   . Days of Exercise per Week:   . Minutes of Exercise per Session:   Stress:   . Feeling of Stress :   Social Connections:   . Frequency of Communication with Friends and Family:   . Frequency of Social Gatherings with Friends and Family:   . Attends Religious Services:   . Active Member of Clubs or Organizations:   . Attends Archivist Meetings:   Marland Kitchen Marital Status:       Review of Systems denies fever, headache, chest pain, dyspnea, cough, back pain, nausea, vomiting or bleeding.  She does have some abdominal soreness secondary to recent surgery  Vital Signs:pending LMP 11/17/2012   Physical Exam awake, alert.  Chest clear to auscultation bilaterally.  Heart with regular rate and rhythm.  Abdomen soft, positive bowel sounds, some mild diffuse tenderness to palpation.  No significant lower extremity edema.  Imaging: CT CHEST W CONTRAST  Result Date: 05/01/2020 CLINICAL DATA:  Newly diagnosed cervical/uterine cancer. Status post complete hysterectomy on 04/04/2020. EXAM: CT CHEST, ABDOMEN, AND PELVIS WITH CONTRAST TECHNIQUE: Multidetector CT imaging of the chest, abdomen and pelvis was performed following the standard protocol during bolus administration of intravenous contrast. CONTRAST:  166mL OMNIPAQUE IOHEXOL 300 MG/ML  SOLN COMPARISON:  None. FINDINGS: CT CHEST FINDINGS Cardiovascular: Minimal coronary artery calcifications. Normal sized heart. Mediastinum/Nodes: Several subcentimeter thyroid nodules. Not  clinically significant; no follow-up imaging recommended (ref: J Am Coll Radiol. 2015 Feb;12(2): 143-50). Lungs/Pleura: Lungs are clear. No pleural effusion or pneumothorax. Musculoskeletal: Lower cervical spine degenerative changes and minimal thoracic spine degenerative changes. Approximately 20% central posterior wedge defect involving the T5 vertebral body with no acute fracture lines or bony retropulsion. No evidence of bony metastatic disease. CT ABDOMEN PELVIS FINDINGS Hepatobiliary: Multiple ill-defined low density liver masses. The largest is in the posterior segment of the right lobe, measuring 2.0 x 1.5 cm on image number 50 series 2. Poorly distended gallbladder with at least 1 stone with peripheral wall calcifications. This measures 2.5 cm in maximum diameter. No gallbladder wall thickening or pericholecystic fluid. Pancreas: Unremarkable. No pancreatic ductal  dilatation or surrounding inflammatory changes. Spleen: Tiny rounded area of low density in the posterior spleen on image number 55 series 2. Otherwise, normal. Adrenals/Urinary Tract: Normal appearing adrenal glands. Tiny cyst in the lateral aspect of the mid to lower right kidney. Unremarkable ureters and urinary bladder. Stomach/Bowel: Small hiatal hernia. Mildly prominent stool in the colon. Unremarkable small bowel. Several tiny appendicoliths in an otherwise normal appearing appendix. Vascular/Lymphatic: No significant vascular findings are present. No enlarged abdominal or pelvic lymph nodes. Reproductive: Status post hysterectomy. No adnexal masses. Moderate diffuse vaginal wall thickening. Small amount of fluid with angulated margins in the uterine bed, compatible with a small amount of residual postoperative fluid. Other: No abdominal wall hernia seen. Musculoskeletal: L4-5 degenerative changes. L5-S1 facet degenerative changes. No evidence of bony metastatic disease. IMPRESSION: 1. Multiple ill-defined low density liver masses,  suspicious for metastases. These could be further characterized with pre and postcontrast magnetic resonance imaging of the liver. 2. Moderate diffuse vaginal wall thickening, possibly representing edema associated with the patient's recent surgery. 3. Small hiatal hernia. 4. Minimal coronary artery atheromatous calcifications. 5. Cholelithiasis. 6. Tiny rounded area of low density in the posterior spleen, too small to characterize. Electronically Signed   By: Claudie Revering M.D.   On: 05/01/2020 16:50   CT Abdomen Pelvis W Contrast  Result Date: 05/01/2020 CLINICAL DATA:  Newly diagnosed cervical/uterine cancer. Status post complete hysterectomy on 04/04/2020. EXAM: CT CHEST, ABDOMEN, AND PELVIS WITH CONTRAST TECHNIQUE: Multidetector CT imaging of the chest, abdomen and pelvis was performed following the standard protocol during bolus administration of intravenous contrast. CONTRAST:  168mL OMNIPAQUE IOHEXOL 300 MG/ML  SOLN COMPARISON:  None. FINDINGS: CT CHEST FINDINGS Cardiovascular: Minimal coronary artery calcifications. Normal sized heart. Mediastinum/Nodes: Several subcentimeter thyroid nodules. Not clinically significant; no follow-up imaging recommended (ref: J Am Coll Radiol. 2015 Feb;12(2): 143-50). Lungs/Pleura: Lungs are clear. No pleural effusion or pneumothorax. Musculoskeletal: Lower cervical spine degenerative changes and minimal thoracic spine degenerative changes. Approximately 20% central posterior wedge defect involving the T5 vertebral body with no acute fracture lines or bony retropulsion. No evidence of bony metastatic disease. CT ABDOMEN PELVIS FINDINGS Hepatobiliary: Multiple ill-defined low density liver masses. The largest is in the posterior segment of the right lobe, measuring 2.0 x 1.5 cm on image number 50 series 2. Poorly distended gallbladder with at least 1 stone with peripheral wall calcifications. This measures 2.5 cm in maximum diameter. No gallbladder wall thickening or  pericholecystic fluid. Pancreas: Unremarkable. No pancreatic ductal dilatation or surrounding inflammatory changes. Spleen: Tiny rounded area of low density in the posterior spleen on image number 55 series 2. Otherwise, normal. Adrenals/Urinary Tract: Normal appearing adrenal glands. Tiny cyst in the lateral aspect of the mid to lower right kidney. Unremarkable ureters and urinary bladder. Stomach/Bowel: Small hiatal hernia. Mildly prominent stool in the colon. Unremarkable small bowel. Several tiny appendicoliths in an otherwise normal appearing appendix. Vascular/Lymphatic: No significant vascular findings are present. No enlarged abdominal or pelvic lymph nodes. Reproductive: Status post hysterectomy. No adnexal masses. Moderate diffuse vaginal wall thickening. Small amount of fluid with angulated margins in the uterine bed, compatible with a small amount of residual postoperative fluid. Other: No abdominal wall hernia seen. Musculoskeletal: L4-5 degenerative changes. L5-S1 facet degenerative changes. No evidence of bony metastatic disease. IMPRESSION: 1. Multiple ill-defined low density liver masses, suspicious for metastases. These could be further characterized with pre and postcontrast magnetic resonance imaging of the liver. 2. Moderate diffuse vaginal wall thickening, possibly representing edema associated  with the patient's recent surgery. 3. Small hiatal hernia. 4. Minimal coronary artery atheromatous calcifications. 5. Cholelithiasis. 6. Tiny rounded area of low density in the posterior spleen, too small to characterize. Electronically Signed   By: Claudie Revering M.D.   On: 05/01/2020 16:50   MR Liver W Wo Contrast  Result Date: 05/14/2020 CLINICAL DATA:  History of uterine cancer, status post hysterectomy 04/04/2020. Multiple ill-defined liver lesions on recent CT scan. EXAM: MRI ABDOMEN WITHOUT AND WITH CONTRAST TECHNIQUE: Multiplanar multisequence MR imaging of the abdomen was performed both before  and after the administration of intravenous contrast. CONTRAST:  80mL GADAVIST GADOBUTROL 1 MMOL/ML IV SOLN COMPARISON:  CT scan 05/01/2020 FINDINGS: Lower chest: Unremarkable. Hepatobiliary: Multiple hepatic lesions are evident. Dominant lesion is in the medial right liver (segment VII) measuring 2.0 x 1.5 cm. This has intermediate to high signal intensity on T2 with low T1 signal intensity. No evidence for restricted diffusion and images obtained after IV contrast administration show peripheral nodular enhancement. Imaging features are most compatible with cavernous hemangioma. There is a second smaller lesion in segment VIII measuring 8 mm also with some early peripheral nodular enhancement after IV contrast administration suggesting hemangioma. The remaining liver lesions are too small to characterize measuring up to only about 5 mm. 17 mm gallstone evident.  Insert no biliary Pancreas: No focal mass lesion. No dilatation of the main duct. No intraparenchymal cyst. No focal mass lesion. No dilatation of the main duct. No intraparenchymal cyst. No peripancreatic edema. Spleen:  No splenomegaly. No focal mass lesion. Adrenals/Urinary Tract: No adrenal nodule or mass. Tiny subcapsular foci of non enhancement in the right kidney are likely cysts. No suspicious enhancing abnormality in either kidney. Stomach/Bowel: Stomach is unremarkable. No gastric wall thickening. No evidence of outlet obstruction. Duodenum is normally positioned as is the ligament of Treitz. No small bowel or colonic dilatation within the visualized abdomen. Vascular/Lymphatic: No abdominal aortic aneurysm. No abdominal lymphadenopathy. Other:  No intraperitoneal free fluid. Musculoskeletal: No suspicious marrow enhancement within the visualized bony anatomy. IMPRESSION: 1. The 2 dominant right hepatic lobe lesions have MR imaging features most consistent with benign cavernous hemangioma. The remaining tiny liver lesions are too small to  characterize. Statistically, while these are likely benign, follow-up MRI in 3 months recommended to ensure stability. 2. No other findings to suggest metastatic disease. Electronically Signed   By: Misty Stanley M.D.   On: 05/14/2020 09:15    Labs:  CBC: Recent Labs    03/26/20 1205 04/07/20 1557 04/29/20 1029 05/22/20 0819  WBC 9.5 15.4* 6.6 6.9  HGB 9.5* 9.4* 10.2* 10.5*  HCT 30.4* 30.0* 33.0* 34.0*  PLT 318 124* 153 118*    COAGS: Recent Labs    05/22/20 0819  INR 1.0    BMP: Recent Labs    03/14/20 2032 03/26/20 1205 04/24/20 1059 05/22/20 0819  NA 139 139 142 140  K 3.3* 3.9 3.5 3.4*  CL 101 103 104 102  CO2 27 26 25 28   GLUCOSE 125* 92 114* 136*  BUN 15 23* 15 15  CALCIUM 9.6 9.1 10.1 9.5  CREATININE 0.84 0.96 1.10* 0.83  GFRNONAA >60 >60 55* >60  GFRAA >60 >60 >60 >60    LIVER FUNCTION TESTS: Recent Labs    12/30/19 0738 03/14/20 2032  BILITOT 0.7 0.7  AST 18 16  ALT 18 20  ALKPHOS 69 68  PROT 7.8 8.2*  ALBUMIN 4.5 4.7    TUMOR MARKERS: No results for input(s):  AFPTM, CEA, CA199, CHROMGRNA in the last 8760 hours.  Assessment and Plan: 59 y.o. female with history of newly diagnosed carcinosarcoma of the uterus and prior hysterectomy on 04/07/2020 who presents today for Port-A-Cath placement for chemotherapy. Risks and benefits of image guided port-a-catheter placement was discussed with the patient including, but not limited to bleeding, infection, pneumothorax, or fibrin sheath development and need for additional procedures.  All of the patient's questions were answered, patient is agreeable to proceed. Consent signed and in chart.     Thank you for this interesting consult.  I greatly enjoyed meeting Hargun Spurling and look forward to participating in their care.  A copy of this report was sent to the requesting provider on this date.  Electronically Signed: D. Rowe Robert, PA-C 05/22/2020, 9:05 AM   I spent a total of  25 minutes   in  face to face in clinical consultation, greater than 50% of which was counseling/coordinating care for Port-A-Cath placement

## 2020-05-22 NOTE — Progress Notes (Signed)
Pharmacist Chemotherapy Monitoring - Initial Assessment    Anticipated start date: 05/29/2020   Regimen:  . Are orders appropriate based on the patient's diagnosis, regimen, and cycle? Yes . Does the plan date match the patient's scheduled date? Yes . Is the sequencing of drugs appropriate? Yes . Are the premedications appropriate for the patient's regimen? Yes . Prior Authorization for treatment is: Approved o If applicable, is the correct biosimilar selected based on the patient's insurance? not applicable  Organ Function and Labs: Marland Kitchen Are dose adjustments needed based on the patient's renal function, hepatic function, or hematologic function? Yes . Are appropriate labs ordered prior to the start of patient's treatment? Yes . Other organ system assessment, if indicated: N/A . The following baseline labs, if indicated, have been ordered: N/A  Dose Assessment: . Are the drug doses appropriate? Yes . Are the following correct: o Drug concentrations Yes o IV fluid compatible with drug Yes o Administration routes Yes o Timing of therapy Yes . If applicable, does the patient have documented access for treatment and/or plans for port-a-cath placement? yes . If applicable, have lifetime cumulative doses been properly documented and assessed? no Lifetime Dose Tracking  No doses have been documented on this patient for the following tracked chemicals: Doxorubicin, Epirubicin, Idarubicin, Daunorubicin, Mitoxantrone, Bleomycin, Oxaliplatin, Carboplatin, Liposomal Doxorubicin  o   Toxicity Monitoring/Prevention: . The patient has the following take home antiemetics prescribed: Ondansetron . The patient has the following take home medications prescribed: N/A . Medication allergies and previous infusion related reactions, if applicable, have been reviewed and addressed. No . The patient's current medication list has been assessed for drug-drug interactions with their chemotherapy regimen. no  significant drug-drug interactions were identified on review.  Order Review: . Are the treatment plan orders signed? Yes . Is the patient scheduled to see a provider prior to their treatment? No  I verify that I have reviewed each item in the above checklist and answered each question accordingly.  Jolynn Bajorek D 05/22/2020 10:57 AM

## 2020-05-22 NOTE — Procedures (Signed)
Interventional Radiology Procedure:   Indications: Uterine cancer  Procedure: Port placement  Findings: Right jugular port, tip at SVC/RA junction  Complications: None     EBL: Minimal, less than 10 ml  Plan: Discharge in one hour.  Keep port site and incisions dry for at least 24 hours.     Daanya Lanphier R. Sahaana Weitman, MD  Pager: [phone]   

## 2020-05-28 ENCOUNTER — Ambulatory Visit: Payer: PRIVATE HEALTH INSURANCE | Admitting: Obstetrics & Gynecology

## 2020-05-29 ENCOUNTER — Encounter: Payer: Self-pay | Admitting: Oncology

## 2020-05-29 ENCOUNTER — Inpatient Hospital Stay: Payer: PRIVATE HEALTH INSURANCE | Attending: Gynecologic Oncology

## 2020-05-29 ENCOUNTER — Other Ambulatory Visit: Payer: Self-pay | Admitting: Hematology and Oncology

## 2020-05-29 ENCOUNTER — Ambulatory Visit: Payer: PRIVATE HEALTH INSURANCE

## 2020-05-29 ENCOUNTER — Inpatient Hospital Stay: Payer: PRIVATE HEALTH INSURANCE

## 2020-05-29 ENCOUNTER — Other Ambulatory Visit: Payer: Self-pay

## 2020-05-29 VITALS — BP 132/78 | HR 81 | Temp 97.6°F | Resp 16

## 2020-05-29 DIAGNOSIS — R2689 Other abnormalities of gait and mobility: Secondary | ICD-10-CM | POA: Diagnosis not present

## 2020-05-29 DIAGNOSIS — G620 Drug-induced polyneuropathy: Secondary | ICD-10-CM | POA: Insufficient documentation

## 2020-05-29 DIAGNOSIS — I10 Essential (primary) hypertension: Secondary | ICD-10-CM | POA: Insufficient documentation

## 2020-05-29 DIAGNOSIS — Z7189 Other specified counseling: Secondary | ICD-10-CM

## 2020-05-29 DIAGNOSIS — Z5111 Encounter for antineoplastic chemotherapy: Secondary | ICD-10-CM | POA: Diagnosis present

## 2020-05-29 DIAGNOSIS — Z90722 Acquired absence of ovaries, bilateral: Secondary | ICD-10-CM | POA: Insufficient documentation

## 2020-05-29 DIAGNOSIS — D61818 Other pancytopenia: Secondary | ICD-10-CM | POA: Insufficient documentation

## 2020-05-29 DIAGNOSIS — Z90710 Acquired absence of both cervix and uterus: Secondary | ICD-10-CM | POA: Diagnosis not present

## 2020-05-29 DIAGNOSIS — G629 Polyneuropathy, unspecified: Secondary | ICD-10-CM | POA: Diagnosis not present

## 2020-05-29 DIAGNOSIS — Z79818 Long term (current) use of other agents affecting estrogen receptors and estrogen levels: Secondary | ICD-10-CM | POA: Diagnosis not present

## 2020-05-29 DIAGNOSIS — R300 Dysuria: Secondary | ICD-10-CM | POA: Diagnosis not present

## 2020-05-29 DIAGNOSIS — R64 Cachexia: Secondary | ICD-10-CM | POA: Diagnosis not present

## 2020-05-29 DIAGNOSIS — K769 Liver disease, unspecified: Secondary | ICD-10-CM

## 2020-05-29 DIAGNOSIS — G893 Neoplasm related pain (acute) (chronic): Secondary | ICD-10-CM | POA: Diagnosis not present

## 2020-05-29 DIAGNOSIS — R531 Weakness: Secondary | ICD-10-CM | POA: Diagnosis not present

## 2020-05-29 DIAGNOSIS — C549 Malignant neoplasm of corpus uteri, unspecified: Secondary | ICD-10-CM

## 2020-05-29 DIAGNOSIS — C55 Malignant neoplasm of uterus, part unspecified: Secondary | ICD-10-CM | POA: Insufficient documentation

## 2020-05-29 LAB — CMP (CANCER CENTER ONLY)
ALT: 21 U/L (ref 0–44)
AST: 18 U/L (ref 15–41)
Albumin: 4.3 g/dL (ref 3.5–5.0)
Alkaline Phosphatase: 94 U/L (ref 38–126)
Anion gap: 12 (ref 5–15)
BUN: 17 mg/dL (ref 6–20)
CO2: 24 mmol/L (ref 22–32)
Calcium: 10.1 mg/dL (ref 8.9–10.3)
Chloride: 102 mmol/L (ref 98–111)
Creatinine: 1.02 mg/dL — ABNORMAL HIGH (ref 0.44–1.00)
GFR, Est AFR Am: >60 mL/min (ref >60)
GFR, Estimated: 60 mL/min — ABNORMAL LOW (ref >60)
Glucose, Bld: 221 mg/dL — ABNORMAL HIGH (ref 70–99)
Potassium: 3.9 mmol/L (ref 3.5–5.1)
Sodium: 138 mmol/L (ref 135–145)
Total Bilirubin: 0.5 mg/dL (ref 0.3–1.2)
Total Protein: 8.6 g/dL — ABNORMAL HIGH (ref 6.5–8.1)

## 2020-05-29 LAB — CBC WITH DIFFERENTIAL (CANCER CENTER ONLY)
Abs Immature Granulocytes: 0.04 10*3/uL (ref 0.00–0.07)
Basophils Absolute: 0 10*3/uL (ref 0.0–0.1)
Basophils Relative: 0 %
Eosinophils Absolute: 0 10*3/uL (ref 0.0–0.5)
Eosinophils Relative: 0 %
HCT: 36.1 % (ref 36.0–46.0)
Hemoglobin: 11.2 g/dL — ABNORMAL LOW (ref 12.0–15.0)
Immature Granulocytes: 0 %
Lymphocytes Relative: 11 %
Lymphs Abs: 1.1 10*3/uL (ref 0.7–4.0)
MCH: 26.3 pg (ref 26.0–34.0)
MCHC: 31 g/dL (ref 30.0–36.0)
MCV: 84.7 fL (ref 80.0–100.0)
Monocytes Absolute: 0.1 10*3/uL (ref 0.1–1.0)
Monocytes Relative: 1 %
Neutro Abs: 8.3 10*3/uL — ABNORMAL HIGH (ref 1.7–7.7)
Neutrophils Relative %: 88 %
Platelet Count: 101 10*3/uL — ABNORMAL LOW (ref 150–400)
RBC: 4.26 MIL/uL (ref 3.87–5.11)
RDW: 14.2 % (ref 11.5–15.5)
WBC Count: 9.5 10*3/uL (ref 4.0–10.5)
nRBC: 0 % (ref 0.0–0.2)

## 2020-05-29 MED ORDER — PALONOSETRON HCL INJECTION 0.25 MG/5ML
INTRAVENOUS | Status: AC
  Filled 2020-05-29: qty 5

## 2020-05-29 MED ORDER — SODIUM CHLORIDE 0.9 % IV SOLN
670.0000 mg | Freq: Once | INTRAVENOUS | Status: AC
  Administered 2020-05-29: 670 mg via INTRAVENOUS
  Filled 2020-05-29: qty 67

## 2020-05-29 MED ORDER — SODIUM CHLORIDE 0.9 % IV SOLN
Freq: Once | INTRAVENOUS | Status: AC
  Filled 2020-05-29: qty 250

## 2020-05-29 MED ORDER — FAMOTIDINE IN NACL 20-0.9 MG/50ML-% IV SOLN
20.0000 mg | Freq: Once | INTRAVENOUS | Status: AC
  Administered 2020-05-29: 20 mg via INTRAVENOUS

## 2020-05-29 MED ORDER — SODIUM CHLORIDE 0.9 % IV SOLN
150.0000 mg | Freq: Once | INTRAVENOUS | Status: AC
  Administered 2020-05-29: 150 mg via INTRAVENOUS
  Filled 2020-05-29: qty 150

## 2020-05-29 MED ORDER — DIPHENHYDRAMINE HCL 50 MG/ML IJ SOLN
50.0000 mg | Freq: Once | INTRAMUSCULAR | Status: AC
  Administered 2020-05-29: 50 mg via INTRAVENOUS

## 2020-05-29 MED ORDER — SODIUM CHLORIDE 0.9 % IV SOLN
175.0000 mg/m2 | Freq: Once | INTRAVENOUS | Status: AC
  Administered 2020-05-29: 354 mg via INTRAVENOUS
  Filled 2020-05-29: qty 59

## 2020-05-29 MED ORDER — SODIUM CHLORIDE 0.9% FLUSH
10.0000 mL | INTRAVENOUS | Status: DC | PRN
  Administered 2020-05-29: 10 mL
  Filled 2020-05-29: qty 10

## 2020-05-29 MED ORDER — DIPHENHYDRAMINE HCL 50 MG/ML IJ SOLN
INTRAMUSCULAR | Status: AC
  Filled 2020-05-29: qty 1

## 2020-05-29 MED ORDER — FAMOTIDINE IN NACL 20-0.9 MG/50ML-% IV SOLN
INTRAVENOUS | Status: AC
  Filled 2020-05-29: qty 50

## 2020-05-29 MED ORDER — PALONOSETRON HCL INJECTION 0.25 MG/5ML
0.2500 mg | Freq: Once | INTRAVENOUS | Status: AC
  Administered 2020-05-29: 0.25 mg via INTRAVENOUS

## 2020-05-29 MED ORDER — HEPARIN SOD (PORK) LOCK FLUSH 100 UNIT/ML IV SOLN
500.0000 [IU] | Freq: Once | INTRAVENOUS | Status: AC | PRN
  Administered 2020-05-29: 500 [IU]
  Filled 2020-05-29: qty 5

## 2020-05-29 MED ORDER — SODIUM CHLORIDE 0.9 % IV SOLN
10.0000 mg | Freq: Once | INTRAVENOUS | Status: AC
  Administered 2020-05-29: 10 mg via INTRAVENOUS
  Filled 2020-05-29: qty 10

## 2020-05-29 NOTE — Progress Notes (Signed)
Met with Rachel Rivas during her first infusion.  She had questions regarding her schedule for her next infusion where her appointments are separated into two days.  Advised her that I will call her once the appointments are rescheduled.

## 2020-05-29 NOTE — Progress Notes (Signed)
Per Dr. Alvy Bimler ok to start pre medications without waiting on CMET

## 2020-05-29 NOTE — Patient Instructions (Signed)
[facility] Discharge Instructions for Patients Receiving Chemotherapy  Today you received the following chemotherapy agents  Taxol, Carboplatin   To help prevent nausea and vomiting after your treatment, we encourage you to take your nausea medication as directed by the MD    If you develop nausea and vomiting that is not controlled by your nausea medication, call the clinic.  Do not take Zofran for three days following the treatment .  BELOW ARE SYMPTOMS THAT SHOULD BE REPORTED IMMEDIATELY:  *FEVER GREATER THAN 100.5 F  *CHILLS WITH OR WITHOUT FEVER  NAUSEA AND VOMITING THAT IS NOT CONTROLLED WITH YOUR NAUSEA MEDICATION  *UNUSUAL SHORTNESS OF BREATH  *UNUSUAL BRUISING OR BLEEDING  TENDERNESS IN MOUTH AND THROAT WITH OR WITHOUT PRESENCE OF ULCERS  *URINARY PROBLEMS  *BOWEL PROBLEMS  UNUSUAL RASH Items with * indicate a potential emergency and should be followed up as soon as possible.  Feel free to call the clinic should you have any questions or concerns. The clinic phone number is [phone].  Please show the [facility] at check-in to the Emergency Department and triage nurse.

## 2020-06-01 ENCOUNTER — Encounter: Payer: Self-pay | Admitting: Hematology and Oncology

## 2020-06-01 ENCOUNTER — Other Ambulatory Visit: Payer: Self-pay

## 2020-06-01 ENCOUNTER — Inpatient Hospital Stay: Payer: PRIVATE HEALTH INSURANCE

## 2020-06-01 ENCOUNTER — Telehealth: Payer: Self-pay | Admitting: *Deleted

## 2020-06-01 ENCOUNTER — Other Ambulatory Visit: Payer: Self-pay | Admitting: Hematology and Oncology

## 2020-06-01 ENCOUNTER — Telehealth: Payer: Self-pay | Admitting: Oncology

## 2020-06-01 DIAGNOSIS — R300 Dysuria: Secondary | ICD-10-CM | POA: Insufficient documentation

## 2020-06-01 DIAGNOSIS — Z5111 Encounter for antineoplastic chemotherapy: Secondary | ICD-10-CM | POA: Diagnosis not present

## 2020-06-01 DIAGNOSIS — C549 Malignant neoplasm of corpus uteri, unspecified: Secondary | ICD-10-CM

## 2020-06-01 DIAGNOSIS — M069 Rheumatoid arthritis, unspecified: Secondary | ICD-10-CM

## 2020-06-01 LAB — URINALYSIS, COMPLETE (UACMP) WITH MICROSCOPIC
Bacteria, UA: NONE SEEN
Bilirubin Urine: NEGATIVE
Glucose, UA: NEGATIVE mg/dL
Hgb urine dipstick: NEGATIVE
Ketones, ur: NEGATIVE mg/dL
Leukocytes,Ua: NEGATIVE
Nitrite: NEGATIVE
Protein, ur: NEGATIVE mg/dL
Specific Gravity, Urine: 1.014 (ref 1.005–1.030)
pH: 6 (ref 5.0–8.0)

## 2020-06-01 MED ORDER — HYDROMORPHONE HCL 4 MG PO TABS: 4.0000 mg | ORAL_TABLET | Freq: Four times a day (QID) | ORAL | 0 refills | Status: DC | PRN

## 2020-06-01 MED FILL — HYDROmorphone HCL 4 MG TABS: 4 | 8 days supply | Qty: 30 | Fill #0

## 2020-06-01 NOTE — Telephone Encounter (Signed)
Called Alga and Andria Frames back and advised them of messages below from Dr. Alvy Bimler.  Scheduled lab appointment for today at 11 am and follow up with Dr. Alvy Bimler on Wednesday at 11:20.  Also discussed picking up Dilaudid prescription from [place] and to be aware of constipation as a side effect.  Will also refer to the financial counselors to see if patient qualifies for the [place] to help with medication costs.

## 2020-06-01 NOTE — Telephone Encounter (Signed)
Andria Frames (daughter) called and said Rachel Rivas has been having aching pain in her hips, thighs and calves. She said it is like nerve pain and it started after [name] was doing some cleaning on Saturday.  She has taken tylenol and 1/2 of a Percocet that was left over from surgery without releif.  She has not been able to sleep.  They are wondering if this is from chemo or her history of sciatica and what to do to help relieve it.

## 2020-06-01 NOTE — Telephone Encounter (Signed)
Likely from chemo and possible flare of rheumatoid arthritis I suggest a different pain medicine like dilaudid I can send to [facility] if they agrees to go there to pick up (recommended) Warn her about risks of constipation while on pain medicine

## 2020-06-01 NOTE — Progress Notes (Signed)
Met with patient at registration to introduce myself as Arboriculturist and to offer available resources.  Discussed one-time $1000 Advertising account executive and qualifciations to assist with personal expenses while going through treatment.  Advised what is needed to apply.  Asked about insurance ded/OOP for copays. Patient stated her insurance is paying at 100%.    Gave her my card if interested in applying and for any additional financial questions or concerns.

## 2020-06-01 NOTE — Telephone Encounter (Signed)
UA and urine culture only, please make sure labs are not drawn I sent pain medicine to [facility] pharmacy Please schedule 20 mins follow-up on Wed morning to check on her

## 2020-06-01 NOTE — Telephone Encounter (Signed)
Rachel Rivas also called Rachel Rivas, Therapist, sports (forgot to tell me) and said she is having burning with urination and frequency that started last night.  Should she come in for a UA/cultrue?

## 2020-06-03 ENCOUNTER — Encounter: Payer: Self-pay | Admitting: Hematology and Oncology

## 2020-06-03 ENCOUNTER — Telehealth: Payer: Self-pay

## 2020-06-03 ENCOUNTER — Other Ambulatory Visit: Payer: Self-pay

## 2020-06-03 ENCOUNTER — Inpatient Hospital Stay (HOSPITAL_BASED_OUTPATIENT_CLINIC_OR_DEPARTMENT_OTHER): Payer: PRIVATE HEALTH INSURANCE | Admitting: Hematology and Oncology

## 2020-06-03 DIAGNOSIS — R531 Weakness: Secondary | ICD-10-CM | POA: Insufficient documentation

## 2020-06-03 DIAGNOSIS — G620 Drug-induced polyneuropathy: Secondary | ICD-10-CM | POA: Diagnosis not present

## 2020-06-03 DIAGNOSIS — M898X9 Other specified disorders of bone, unspecified site: Secondary | ICD-10-CM | POA: Diagnosis not present

## 2020-06-03 DIAGNOSIS — I10 Essential (primary) hypertension: Secondary | ICD-10-CM

## 2020-06-03 DIAGNOSIS — C55 Malignant neoplasm of uterus, part unspecified: Secondary | ICD-10-CM | POA: Diagnosis not present

## 2020-06-03 DIAGNOSIS — R64 Cachexia: Secondary | ICD-10-CM

## 2020-06-03 DIAGNOSIS — C549 Malignant neoplasm of corpus uteri, unspecified: Secondary | ICD-10-CM

## 2020-06-03 DIAGNOSIS — R300 Dysuria: Secondary | ICD-10-CM

## 2020-06-03 DIAGNOSIS — T451X5A Adverse effect of antineoplastic and immunosuppressive drugs, initial encounter: Secondary | ICD-10-CM | POA: Insufficient documentation

## 2020-06-03 DIAGNOSIS — Z5111 Encounter for antineoplastic chemotherapy: Secondary | ICD-10-CM | POA: Diagnosis not present

## 2020-06-03 LAB — URINE CULTURE

## 2020-06-03 NOTE — Assessment & Plan Note (Signed)
She is taking blood pressure medication but her blood pressure is low due to poor oral intake I recommend her to check her blood pressure carefully and could consider discontinuation of antihypertensives if it remains low

## 2020-06-03 NOTE — Assessment & Plan Note (Addendum)
This is likely secondary to side effects of treatment It is subsiding She will take Dilaudid as needed along with acetaminophen We discussed narcotic refill policy

## 2020-06-03 NOTE — Assessment & Plan Note (Signed)
She has poor oral intake Her blood pressure is low and she is weak We discussed dietary modification and the importance of eating frequent small meals

## 2020-06-03 NOTE — Assessment & Plan Note (Signed)
Could be due to side effects of treatment versus hypotension from poor oral intake She has some gait imbalance I recommend referral to cancer rehab

## 2020-06-03 NOTE — Progress Notes (Signed)
[facility] OFFICE PROGRESS NOTE  Patient Care Team: [name], MD as PCP - General (Family Medicine) Buford Dresser, MD as PCP - Cardiology (Cardiology) Awanda Mink Craige Cotta, RN as Oncology Nurse Navigator (Oncology)  ASSESSMENT & PLAN:  Adenosarcoma of body of uterus [facility]) She experienced significant bone pain likely secondary to treatment side effects Overall, she is feeling better We will continue supportive care  Bone pain This is likely secondary to side effects of treatment It is subsiding She will take Dilaudid as needed along with acetaminophen We discussed narcotic refill policy  Generalized weakness Could be due to side effects of treatment versus hypotension from poor oral intake She has some gait imbalance I recommend referral to cancer rehab  Peripheral neuropathy due to chemotherapy [facility]) she has mild peripheral neuropathy, likely related to side effects of treatment. It has affected her balance I recommend referral for physical therapy and rehab  Malignant cachexia ([place]) She has poor oral intake Her blood pressure is low and she is weak We discussed dietary modification and the importance of eating frequent small meals  Essential hypertension She is taking blood pressure medication but her blood pressure is low due to poor oral intake I recommend her to check her blood pressure carefully and could consider discontinuation of antihypertensives if it remains low  Dysuria The urine culture showed mixed growth She has minimum symptoms Observe for now   Orders Placed This Encounter  Procedures   Ambulatory referral to Physical Therapy    Referral Priority:   Routine    Referral Type:   Physical Medicine    Referral Reason:   Specialty Services Required    Requested Specialty:   Physical Therapy    Number of Visits Requested:   1    All questions were answered. The patient knows to call the clinic with any problems, questions or  concerns. The total time spent in the appointment was 30 minutes encounter with patients including review of chart and various tests results, discussions about plan of care and coordination of care plan   Heath Lark, MD 06/03/2020 2:44 PM  INTERVAL HISTORY: Please see below for problem oriented charting. She returns with her daughter for further follow-up Since her chemotherapy, she had a lot of complications She developed severe bone pain within 2 days of chemotherapy Acetaminophen was not helpful I prescribed hydromorphone and that was helpful It is subsiding She has poor oral intake and she felt weak Her blood pressure is low She is unsteady and start using a cane to stabilize her gait No recent falls She complained of very mild peripheral neuropathy affecting the tips of fingers and toes Denies recent nausea or constipation SUMMARY OF ONCOLOGIC HISTORY: Oncology History Overview Note  MSI stable   Adenosarcoma of body of uterus ([place])  03/03/2020 Initial Diagnosis   She developed PMB in early April after going through menopause at approximately age 59. She saw her PCP on 4/13 for bleeding and an ultrasound was obtained showing a thickened endometrial lining and 3-4cm mass within the endocervical canal. She was referred to [facility], started on Megace and ultimately underwent a TRH/BSO on 5/18 for abnormal uterine bleeding with resulting anemia, large prolapsing fibroid.    03/06/2020 Imaging   US pelvis Markedly thickened heterogeneous, and hypervascular endometrial complex up to 38 mm thick highly concerning for endometrial neoplasm.   Additional more focal 3.0 x 2.8 x 5.3 cm diameter mass within endocervical canal, may represent extension of above endometrial  pathology versus separate endocervical tumor.   Tissue diagnosis recommended.   Nonvisualization of ovaries.   04/07/2020 Pathology Results   A. UTERUS, CERVIX AND BILATERAL FALLOPIAN TUBES AND OVARIES, HYSTERECTOMY  AND BILATERAL SALPINGO-OOPHORECTOMY: - Mullerian adenosarcoma of endometrium. - Tumor limited to the uterus. - Tumor invades to less than half of the myometrium. - No involvement of adnexa. - Margins of resection are not involved. - See oncology table and comment. ONCOLOGY TABLE: UTERUS, SARCOMA: Procedure: Total hysterectomy and bilateral salpingo-oophorectomy Specimen Integrity: The uterus, cervix and bilateral fallopian tubes and ovaries were received intact. The nodule clinically identified as vaginally prolapsed uterine fibroid was received separate and disrupted. Tumor Size: Greatest dimension: 7.5 cm Histologic Type: Mullerian adenosarcoma with overgrowth of the epithelial and stromal components Histologic Grade: The epithelial component has proliferated to form endometrioid adenocarcinoma which ranges from low grade to high grade. The stromal component focally shows sarcomatous overgrowth. Myometrial Invasion: Present Depth of invasion: 4 mm Myometrial thickness: 30 mm Other Tissue/Organ Involvement: Not identified Margins: Uninvolved by sarcoma Lymphovascular Invasion: Not identified Regional Lymph Nodes: No lymph nodes submitted or found Pathologic Stage Classification (pTNM, AJCC 8th Edition): pT1b, pNX Additional Pathologic Findings: Adenomyosis. Leiomyomata.   04/07/2020 Surgery   PRE-OPERATIVE DIAGNOSIS:  Large prolapsed fibroid vaginally, menometrorrhagia with anemia   POST-OPERATIVE DIAGNOSIS:  Large prolapsed fibroid vaginally, menometrorrhagia with anemia.  Omental adhesions with anterior abdominal wall.   PROCEDURE:  Procedure(s): XI ROBOTIC TOTAL LAPAROSCOPIC HYSTERECTOMY, BILATERAL SALPINGO OOPHORECTOMY/LYSIS OF ADHESIONS/VAGINAL MYOMECTOMY    FINDINGS: Large necrotic vaginally prolapsed uterine myoma.  Uterus with small subserosal/intramural myoma.  Bilateral tubes post tubal ligation.  Bilateral normal ovaries.  Omental adhesions with anterior abdominal  wall.   05/01/2020 Imaging   Ct scan of chest, abdomen and pelvis 1. Multiple ill-defined low density liver masses, suspicious for metastases. These could be further characterized with pre and postcontrast magnetic resonance imaging of the liver. 2. Moderate diffuse vaginal wall thickening, possibly representing edema associated with the patient's recent surgery. 3. Small hiatal hernia. 4. Minimal coronary artery atheromatous calcifications. 5. Cholelithiasis. 6. Tiny rounded area of low density in the posterior spleen, too small to characterize.   05/11/2020 Cancer Staging   Staging form: Corpus Uteri - Adenosarcoma, AJCC 8th Edition - Pathologic: Stage IB (pT1b, pN0, cM0) - Signed by Heath Lark, MD on 05/15/2020   05/14/2020 Imaging   MRI liver 1. The 2 dominant right hepatic lobe lesions have MR imaging features most consistent with benign cavernous hemangioma. The remaining tiny liver lesions are too small to characterize. Statistically, while these are likely benign, follow-up MRI in 3 months recommended to ensure stability. 2. No other findings to suggest metastatic disease.   05/22/2020 Procedure   Placement of a subcutaneous port device. Catheter tip at the superior cavoatrial junction   05/29/2020 -  Chemotherapy   The patient had carboplatin and taxol for chemotherapy treatment.       REVIEW OF SYSTEMS:   Constitutional: Denies fevers, chills or abnormal weight loss Eyes: Denies blurriness of vision Ears, nose, mouth, throat, and face: Denies mucositis or sore throat Respiratory: Denies cough, dyspnea or wheezes Cardiovascular: Denies palpitation, chest discomfort or lower extremity swelling Gastrointestinal:  Denies nausea, heartburn or change in bowel habits Skin: Denies abnormal skin rashes Lymphatics: Denies new lymphadenopathy or easy bruising Behavioral/Psych: Mood is stable, no new changes  All other systems were reviewed with the patient and are negative.  I have  reviewed the past medical history, past surgical history, social  history and family history with the patient and they are unchanged from previous note.  ALLERGIES:  is allergic to latex and sulfur.  MEDICATIONS:  Current Outpatient Medications  Medication Sig Dispense Refill   cyclobenzaprine (FLEXERIL) 10 MG tablet Take 1 tablet (10 mg total) by mouth at bedtime as needed for muscle spasms. 15 tablet 0   dexamethasone (DECADRON) 4 MG tablet Take 2 tabs at the night before and 2 tab the morning of chemotherapy, every 3 weeks, by mouth x 6 cycles 36 tablet 6   famotidine (PEPCID) 20 MG tablet TAKE 1 TABLET BY MOUTH TWICE DAILY FOR HEART 180 tablet 1   hydrochlorothiazide (MICROZIDE) 12.5 MG capsule Take 1 capsule by mouth once daily 90 capsule 1   HYDROmorphone (DILAUDID) 4 MG tablet Take 1 tablet (4 mg total) by mouth every 6 (six) hours as needed for severe pain. 30 tablet 0   lidocaine-prilocaine (EMLA) cream Apply to affected area once 30 g 3   losartan (COZAAR) 50 MG tablet Take 1 tablet by mouth once daily 90 tablet 1   ondansetron (ZOFRAN) 8 MG tablet Take 1 tablet (8 mg total) by mouth every 8 (eight) hours as needed. 30 tablet 1   prochlorperazine (COMPAZINE) 10 MG tablet Take 1 tablet (10 mg total) by mouth every 6 (six) hours as needed (Nausea or vomiting). 30 tablet 1   No current facility-administered medications for this visit.    PHYSICAL EXAMINATION: ECOG PERFORMANCE STATUS: 2 - Symptomatic, <50% confined to bed  Vitals:   06/03/20 1158  BP: 101/63  Pulse: 93  Resp: 18  Temp: 98.1 F (36.7 C)  SpO2: 100%   Filed Weights   06/03/20 1158  Weight: 198 lb 9.6 oz (90.1 kg)    GENERAL:alert, no distress and comfortable NEURO: alert & oriented x 3 with fluent speech, no focal motor/sensory deficits  LABORATORY DATA:  I have reviewed the data as listed    Component Value Date/Time   NA 138 05/29/2020 0815   NA 142 04/07/2017 0810   K 3.9 05/29/2020 0815    K 3.8 04/07/2017 0810   CL 102 05/29/2020 0815   CO2 24 05/29/2020 0815   CO2 28 04/07/2017 0810   GLUCOSE 221 (H) 05/29/2020 0815   GLUCOSE 100 04/07/2017 0810   BUN 17 05/29/2020 0815   BUN 16.8 04/07/2017 0810   CREATININE 1.02 (H) 05/29/2020 0815   CREATININE 0.9 04/07/2017 0810   CALCIUM 10.1 05/29/2020 0815   CALCIUM 9.9 04/07/2017 0810   PROT 8.6 (H) 05/29/2020 0815   PROT 8.0 04/07/2017 0810   ALBUMIN 4.3 05/29/2020 0815   ALBUMIN 4.4 04/07/2017 0810   AST 18 05/29/2020 0815   AST 18 04/07/2017 0810   ALT 21 05/29/2020 0815   ALT 23 04/07/2017 0810   ALKPHOS 94 05/29/2020 0815   ALKPHOS 79 04/07/2017 0810   BILITOT 0.5 05/29/2020 0815   BILITOT 0.70 04/07/2017 0810   GFRNONAA 60 (L) 05/29/2020 0815   GFRAA >60 05/29/2020 0815    No results found for: SPEP, UPEP  Lab Results  Component Value Date   WBC 9.5 05/29/2020   NEUTROABS 8.3 (H) 05/29/2020   HGB 11.2 (L) 05/29/2020   HCT 36.1 05/29/2020   MCV 84.7 05/29/2020   PLT 101 (L) 05/29/2020      Chemistry      Component Value Date/Time   NA 138 05/29/2020 0815   NA 142 04/07/2017 0810   K 3.9 05/29/2020 0815   K  3.8 04/07/2017 0810   CL 102 05/29/2020 0815   CO2 24 05/29/2020 0815   CO2 28 04/07/2017 0810   BUN 17 05/29/2020 0815   BUN 16.8 04/07/2017 0810   CREATININE 1.02 (H) 05/29/2020 0815   CREATININE 0.9 04/07/2017 0810      Component Value Date/Time   CALCIUM 10.1 05/29/2020 0815   CALCIUM 9.9 04/07/2017 0810   ALKPHOS 94 05/29/2020 0815   ALKPHOS 79 04/07/2017 0810   AST 18 05/29/2020 0815   AST 18 04/07/2017 0810   ALT 21 05/29/2020 0815   ALT 23 04/07/2017 0810   BILITOT 0.5 05/29/2020 0815   BILITOT 0.70 04/07/2017 0810

## 2020-06-03 NOTE — Assessment & Plan Note (Signed)
She experienced significant bone pain likely secondary to treatment side effects Overall, she is feeling better We will continue supportive care

## 2020-06-03 NOTE — Telephone Encounter (Signed)
TC to pt per Dr Alvy Bimler to let her know that her urine culture does not show UTI, so no antibiotics needed. Patient verbalized understanding.

## 2020-06-03 NOTE — Assessment & Plan Note (Signed)
The urine culture showed mixed growth She has minimum symptoms Observe for now

## 2020-06-03 NOTE — Assessment & Plan Note (Signed)
she has mild peripheral neuropathy, likely related to side effects of treatment. It has affected her balance I recommend referral for physical therapy and rehab

## 2020-06-09 ENCOUNTER — Encounter: Payer: Self-pay | Admitting: Obstetrics & Gynecology

## 2020-06-09 ENCOUNTER — Other Ambulatory Visit: Payer: Self-pay

## 2020-06-09 ENCOUNTER — Ambulatory Visit (INDEPENDENT_AMBULATORY_CARE_PROVIDER_SITE_OTHER): Payer: PRIVATE HEALTH INSURANCE | Admitting: Obstetrics & Gynecology

## 2020-06-09 VITALS — BP 132/78

## 2020-06-09 DIAGNOSIS — Z09 Encounter for follow-up examination after completed treatment for conditions other than malignant neoplasm: Secondary | ICD-10-CM

## 2020-06-09 DIAGNOSIS — C55 Malignant neoplasm of uterus, part unspecified: Secondary | ICD-10-CM

## 2020-06-09 NOTE — Progress Notes (Signed)
    Rachel Rivas 11-25-1960 [id number]        59 y.o.  [id number]   RP: Postop XI Robotic TLH/BSO/Vaginal Myomectomy/Lysis of Adhesions on 04/07/2020  HPI: Incidental finding of an AdenoSarcoma of the Uterus.  Managed by Dr Alvy Bimler, Oncologist.  Received carboplatin and taxol on 7/9th/2021.  Doing better and better now.  No abdomino-pelvic pain.  No vaginal bleeding.  No fever.   OB History  Gravida Para Term Preterm AB Living  3 2     1 2   SAB TAB Ectopic Multiple Live Births    1          # Outcome Date GA Lbr Len/2nd Weight Sex Delivery Anes PTL Lv  3 TAB           2 Para           1 Para             Past medical history,surgical history, problem list, medications, allergies, family history and social history were all reviewed and documented in the EPIC chart.   Directed ROS with pertinent positives and negatives documented in the history of present illness/assessment and plan.  Exam:  Vitals:   06/09/20 0932  BP: 132/78   General appearance:  Normal  Abdomen: Incisions well healed.  Gynecologic exam: Vulva normal.  Speculum:  Vaginal vault well closed.  Normal secretions.  No bleeding.  Bimanual exam:  Vaginal vault well closed, no induration, NT.  No pelvic mass felt.   Assessment/Plan:  59 y.o. [id number]   1. Status post gynecological surgery, follow-up exam Very good healing postop.  Can resume physical activity as well as sexual intercourse.  We will follow-up for annual gynecologic exam in 1 year.  2. Adenosarcoma of uterus ([place]) Adenosarcoma of the uterus.  Followed by oncology, Dr. Alvy Bimler.  Received carboplatin and Taxol on May 29, 2020.  Will be followed by gynecologic college he as well.  Princess Bruins MD, 9:53 AM 06/09/2020

## 2020-06-11 ENCOUNTER — Other Ambulatory Visit: Payer: Self-pay

## 2020-06-11 ENCOUNTER — Ambulatory Visit: Payer: PRIVATE HEALTH INSURANCE | Attending: Hematology and Oncology | Admitting: Physical Therapy

## 2020-06-11 DIAGNOSIS — R209 Unspecified disturbances of skin sensation: Secondary | ICD-10-CM | POA: Insufficient documentation

## 2020-06-11 DIAGNOSIS — R29898 Other symptoms and signs involving the musculoskeletal system: Secondary | ICD-10-CM | POA: Insufficient documentation

## 2020-06-11 DIAGNOSIS — Z483 Aftercare following surgery for neoplasm: Secondary | ICD-10-CM | POA: Insufficient documentation

## 2020-06-11 NOTE — Therapy (Signed)
[place], [place], [address] Phone: [phone]   Fax:  [phone]  Physical Therapy Evaluation  Patient Details  Name: Rachel Rivas MRN: [id number] Date of Birth: 07/05/1961 Referring Provider (PT): Dr. Alvy Bimler    Encounter Date: 06/11/2020   PT End of Session - 06/11/20 1554    Visit Number 1    Number of Visits 17    Date for PT Re-Evaluation 08/12/20    PT Start Time 1500    PT Stop Time 1545    PT Time Calculation (min) 45 min    Activity Tolerance Patient tolerated treatment well    Behavior During Therapy [facility] for tasks assessed/performed           Past Medical History:  Diagnosis Date  . Allergy    occasionally  . Anemia    yrs ago, low platelets  . Arthritis    RA  . Blood transfusion without reported diagnosis    1985 with c section  . Borderline diabetic   . Dyspnea    While taking Megace  . GERD (gastroesophageal reflux disease)   . Hypertension   . Obstructive sleep apnea syndrome 01/07/2020  . Thrombocytopenia ([place])     Past Surgical History:  Procedure Laterality Date  . CESAREAN SECTION    . COLONOSCOPY    . IR IMAGING GUIDED PORT INSERTION  05/22/2020  . MYOMECTOMY N/A 04/07/2020   Procedure: VAGINAL MYOMECTOMY;  Surgeon: Princess Bruins, MD;  Location: [facility];  Service: Gynecology;  Laterality: N/A;  . POLYPECTOMY    . ROBOTIC ASSISTED TOTAL HYSTERECTOMY WITH BILATERAL SALPINGO OOPHERECTOMY Bilateral 04/07/2020   Procedure: XI ROBOTIC TOTAL LAPAROSCOPIC HYSTERECTOMY, BILATERAL SALPINGO OOPHORECTOMY;  Surgeon: Princess Bruins, MD;  Location: [facility];  Service: Gynecology;  Laterality: Bilateral;  . STERILIZATION    . TUBAL LIGATION      There were no vitals filed for this visit.    Subjective Assessment - 06/11/20 1509    Subjective Pt stated that she has just started chemotherapy(7/9)  and developed severe back pain  about july 11 with leg weakness and bone and joint pain.  She got some pain medicine and pain has gradually improved and now she feels she is back to baseline and did not have to take any medicine today.  She still has some mild tingling in her hands and feet.  She plans to have 6 cylces of chemo every 3 weeks    Pertinent History diagnosis of uterine cancer with laparoscopic hysterectomy 04/07/2020( all cancer was removed) she has started chemotherapy    Patient Stated Goals To get legs stronger to deal better with chemotherapy    Currently in Pain? Yes   not really pain, but soreness   Pain Score 2     Pain Location Foot    Pain Orientation Right;Left   the whole foot   Pain Descriptors / Indicators Sore    Pain Onset 1 to 4 weeks ago   started with chemotherapy   Pain Frequency Constant    Aggravating Factors  nothing noted    Pain Relieving Factors medicine helped              [facility] PT Assessment - 06/11/20 0001      Assessment   Medical Diagnosis uterine cancer     Referring Provider (PT) Dr. Alvy Bimler     Onset Date/Surgical Date 04/07/20    Hand Dominance Right  Precautions   Precautions Fall      Restrictions   Weight Bearing Restrictions No      Balance Screen   Has the patient fallen in the past 6 months No    Has the patient had a decrease in activity level because of a fear of falling?  No    Is the patient reluctant to leave their home because of a fear of falling?  No      Home Social worker Private residence    Living Arrangements Spouse/significant other;Children    Available Help at Discharge Available 24 hours/day    [place] - single point      Prior Function   Level of Independence Independent    Vocation Full time employment    Vocation Requirements telehealth     Leisure walks 10-15 minutes daily       Cognition   Overall Cognitive Status Within Functional Limits for tasks assessed      Sensation   Additional  Comments numbness in her fingetips and toes       Coordination   Gross Motor Movements are Fluid and Coordinated Yes   a little slow, but steady      Functional Tests   Functional tests Sit to Stand      Sit to Stand   Comments 14 reps in 30 sec RPE 4/10 minor shortness of breath       Posture/Postural Control   Posture/Postural Control Postural limitations    Postural Limitations Rounded Shoulders;Forward head;Increased lumbar lordosis      ROM / Strength   AROM / PROM / Strength AROM;Strength      AROM   Overall AROM  Within functional limits for tasks performed;Deficits    Overall AROM Comments limited foward flexion of lumbar spine       Strength   Strength Assessment Site Hand    Right/Left hand Right;Left    Right Hand Grip (lbs) 66/70/62    Left Hand Grip (lbs) 62/55/50      Flexibility   Soft Tissue Assessment /Muscle Length yes    Hamstrings WFL   tightnes in hip flexors biltarally R>L     Palpation   Palpation comment tightness at lumbar spine with decreased forward felxion and maintenance of lumbar curve       Ambulation/Gait   Ambulation/Gait Yes    Ambulation/Gait Assistance 7: Independent    Assistive device None      Standardized Balance Assessment   Standardized Balance Assessment Timed Up and Go Test      Timed Up and Go Test   Normal TUG (seconds) 6.05                      Objective measurements completed on examination: See above findings.                 PT Short Term Goals - 06/11/20 1714      PT SHORT TERM GOAL #1   Title Pt will be indpendent in a HEP for low back and hip streching to help with back pain             PT Long Term Goals - 06/11/20 1715      PT LONG TERM GOAL #1   Title Pt will be independent in a home exercise program for general strength and endurance so that she will be able to tolerate her chemotherapy    Time 8  Period Weeks    Status New      PT LONG TERM GOAL #2   Title Pt will  maintain ablitiy to do  10 repetitions of sit to stand in 30 seconds after 4 rounds of chemotherapy    Baseline 10 reps in 30 sec. on 06/11/2020    Time 8    Period Weeks    Status New      PT LONG TERM GOAL #3   Title Pt will maintan TUG < 7 seconds after 4 rounds of chemotherapy    Baseline 6.05 on 06/11/2020    Time 8    Period Weeks    Status New                  Plan - 06/11/20 1554    Clinical Impression Statement 59 yo female s/p laparoscopic surgery for uterine cancer who had episode of chemotherapy induced peripheral neuropathy with back pain and decreased gait and leg weakness that resolved with pain med.  She feels she is at her baseline now.  She wants to get stronger to avoid this from happening again to same severity.  She does have decreased lumbar flexibility with muscle tightness and would benefit from PT to help decrease lumbar muscle tightess, teach low back and hip flexor stretches, core strengthening, ankle strengthening  and endurance exercises to help her get through the rest of chemo Baseline measures were taken today for grip strength, repeated sit to stand and TUG PT sessions will be scheduled week 2 and 3 after chemo    Personal Factors and Comorbidities Comorbidity 3+    Comorbidities history of low back pain, recent laparoscopic surgery, ongoing chemo.    Stability/Clinical Decision Making Evolving/Moderate complexity    Clinical Decision Making Moderate    Rehab Potential Good    PT Frequency 2x / week   weeks 2 and 3 after chemo   PT Duration 8 weeks    PT Treatment/Interventions ADLs/Self Care Home Management;Moist Heat;Therapeutic activities;Functional mobility training;Therapeutic exercise;Patient/family education;Manual techniques;Passive range of motion    PT Next Visit Plan moist heat to low back, soft tissue work myofascial release as needed, teach stretches to hip flexors and low back, core exercises, progress to           Patient will  benefit from skilled therapeutic intervention in order to improve the following deficits and impairments:  Increased fascial restricitons, Impaired flexibility, Impaired perceived functional ability, Decreased range of motion, Increased muscle spasms  Visit Diagnosis: Unspecified disturbances of skin sensation - Plan: PT plan of care cert/re-cert  Other symptoms and signs involving the musculoskeletal system - Plan: PT plan of care cert/re-cert  Aftercare following surgery for neoplasm - Plan: PT plan of care cert/re-cert     Problem List Patient Active Problem List   Diagnosis Date Noted  . Bone pain 06/03/2020  . Generalized weakness 06/03/2020  . Peripheral neuropathy due to chemotherapy ([place]) 06/03/2020  . Malignant cachexia ([place]) 06/03/2020  . Dysuria 06/01/2020  . Liver lesion 05/13/2020  . Goals of care, counseling/discussion 05/13/2020  . 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 Post-operative state 04/07/2020  . Postoperative state 04/07/2020  . Post-menopausal bleeding 03/26/2020  . Family history of cancer in mother 03/03/2020  . Grade 2 ankle sprain, left, initial encounter 01/07/2020  . Obstructive sleep apnea syndrome 01/07/2020  . Former tobacco use 11/05/2019  . Class 2 severe obesity due to excess calories with serious comorbidity and body mass index (  BMI) of 36.0 to 36.9 in adult [facility]) 11/05/2019  . Shortness of breath 10/28/2019  . Gastroesophageal reflux disease 10/28/2019  . Lateral epicondylitis of right elbow 03/21/2019  . Abnormal CXR 02/21/2019  . Persistent cough for 3 weeks or longer 02/13/2019  . BPPV (benign paroxysmal positional vertigo), right 05/31/2018  . ETD (Eustachian tube dysfunction), bilateral 03/23/2018  . Atopic dermatitis 02/28/2017  . Seborrheic keratoses, inflamed 02/28/2017  . Rheumatoid arthritis ([place]) 03/06/2015  . Thrombocytopenia ([place]) 02/26/2015  . Prediabetes 11/04/2014  . Acute sinus infection 05/26/2014  .  Essential hypertension    Rachel Rivas PT  Rachel Rivas 06/11/2020, 5:20 PM  [place], [place], [address] Phone: [phone]   Fax:  [phone]  Name: Rachel Rivas MRN: [id number] Date of Birth: 05/03/1961

## 2020-06-16 ENCOUNTER — Inpatient Hospital Stay: Payer: PRIVATE HEALTH INSURANCE

## 2020-06-16 ENCOUNTER — Other Ambulatory Visit: Payer: Self-pay

## 2020-06-16 ENCOUNTER — Encounter: Payer: Self-pay | Admitting: Hematology and Oncology

## 2020-06-16 ENCOUNTER — Telehealth: Payer: Self-pay | Admitting: Hematology and Oncology

## 2020-06-16 ENCOUNTER — Inpatient Hospital Stay (HOSPITAL_BASED_OUTPATIENT_CLINIC_OR_DEPARTMENT_OTHER): Payer: PRIVATE HEALTH INSURANCE | Admitting: Hematology and Oncology

## 2020-06-16 DIAGNOSIS — T451X5A Adverse effect of antineoplastic and immunosuppressive drugs, initial encounter: Secondary | ICD-10-CM

## 2020-06-16 DIAGNOSIS — D61818 Other pancytopenia: Secondary | ICD-10-CM | POA: Diagnosis not present

## 2020-06-16 DIAGNOSIS — M898X9 Other specified disorders of bone, unspecified site: Secondary | ICD-10-CM | POA: Diagnosis not present

## 2020-06-16 DIAGNOSIS — Z5111 Encounter for antineoplastic chemotherapy: Secondary | ICD-10-CM | POA: Diagnosis not present

## 2020-06-16 DIAGNOSIS — G620 Drug-induced polyneuropathy: Secondary | ICD-10-CM | POA: Diagnosis not present

## 2020-06-16 DIAGNOSIS — C549 Malignant neoplasm of corpus uteri, unspecified: Secondary | ICD-10-CM

## 2020-06-16 DIAGNOSIS — K769 Liver disease, unspecified: Secondary | ICD-10-CM

## 2020-06-16 DIAGNOSIS — Z7189 Other specified counseling: Secondary | ICD-10-CM

## 2020-06-16 DIAGNOSIS — C55 Malignant neoplasm of uterus, part unspecified: Secondary | ICD-10-CM | POA: Diagnosis not present

## 2020-06-16 LAB — CBC WITH DIFFERENTIAL (CANCER CENTER ONLY)
Abs Immature Granulocytes: 0.02 10*3/uL (ref 0.00–0.07)
Basophils Absolute: 0 10*3/uL (ref 0.0–0.1)
Basophils Relative: 0 %
Eosinophils Absolute: 0.1 10*3/uL (ref 0.0–0.5)
Eosinophils Relative: 1 %
HCT: 32.8 % — ABNORMAL LOW (ref 36.0–46.0)
Hemoglobin: 10.3 g/dL — ABNORMAL LOW (ref 12.0–15.0)
Immature Granulocytes: 0 %
Lymphocytes Relative: 42 %
Lymphs Abs: 2.6 10*3/uL (ref 0.7–4.0)
MCH: 26.3 pg (ref 26.0–34.0)
MCHC: 31.4 g/dL (ref 30.0–36.0)
MCV: 83.9 fL (ref 80.0–100.0)
Monocytes Absolute: 0.7 10*3/uL (ref 0.1–1.0)
Monocytes Relative: 11 %
Neutro Abs: 2.8 10*3/uL (ref 1.7–7.7)
Neutrophils Relative %: 46 %
Platelet Count: 61 10*3/uL — ABNORMAL LOW (ref 150–400)
RBC: 3.91 MIL/uL (ref 3.87–5.11)
RDW: 14.6 % (ref 11.5–15.5)
WBC Count: 6.1 10*3/uL (ref 4.0–10.5)
nRBC: 0 % (ref 0.0–0.2)

## 2020-06-16 LAB — CMP (CANCER CENTER ONLY)
ALT: 23 U/L (ref 0–44)
AST: 16 U/L (ref 15–41)
Albumin: 4.2 g/dL (ref 3.5–5.0)
Alkaline Phosphatase: 84 U/L (ref 38–126)
Anion gap: 9 (ref 5–15)
BUN: 15 mg/dL (ref 6–20)
CO2: 27 mmol/L (ref 22–32)
Calcium: 10.2 mg/dL (ref 8.9–10.3)
Chloride: 106 mmol/L (ref 98–111)
Creatinine: 0.88 mg/dL (ref 0.44–1.00)
GFR, Est AFR Am: >60 mL/min (ref >60)
GFR, Estimated: >60 mL/min (ref >60)
Glucose, Bld: 113 mg/dL — ABNORMAL HIGH (ref 70–99)
Potassium: 3.6 mmol/L (ref 3.5–5.1)
Sodium: 142 mmol/L (ref 135–145)
Total Bilirubin: 0.4 mg/dL (ref 0.3–1.2)
Total Protein: 7.6 g/dL (ref 6.5–8.1)

## 2020-06-16 MED ORDER — HEPARIN SOD (PORK) LOCK FLUSH 100 UNIT/ML IV SOLN
500.0000 [IU] | Freq: Once | INTRAVENOUS | Status: AC
  Administered 2020-06-16: 500 [IU]
  Filled 2020-06-16: qty 5

## 2020-06-16 MED ORDER — SODIUM CHLORIDE 0.9% FLUSH
10.0000 mL | Freq: Once | INTRAVENOUS | Status: AC
  Administered 2020-06-16: 10 mL
  Filled 2020-06-16: qty 10

## 2020-06-16 NOTE — Progress Notes (Signed)
[facility] OFFICE PROGRESS NOTE  Patient Care Team: [name], MD as PCP - General (Family Medicine) Buford Dresser, MD as PCP - Cardiology (Cardiology) Awanda Mink Craige Cotta, RN as Oncology Nurse Navigator (Oncology)  ASSESSMENT & PLAN:  Adenosarcoma of body of uterus [facility]) She has significant multiple side effects from recent chemotherapy including severe pancytopenia, bone pain and neuropathy Her thrombocytopenia is known to be chronic I recommend we proceed with treatment as scheduled but with dose adjustment She is in agreement   Pancytopenia, acquired [facility]) Despite severe pancytopenia, she is not symptomatic I told her, certainly pancytopenia is due to treatment and also we are drawing her labs 3 days in advance because of be going out of town We discussed the risk and benefits of pursuing treatment without delay and she is in agreement, provided that we proceed with dose adjustment She does not need transfusion support  Peripheral neuropathy due to chemotherapy [facility]) she has mild peripheral neuropathy, likely related to side effects of treatment. I plan to reduce the dose of treatment as outlined above.  I explained to the patient the rationale of this strategy and reassured the patient it would not compromise the efficacy of treatment   Bone pain Her bone pain has resolved but we discussed the importance of taking pain medicine if needed   No orders of the defined types were placed in this encounter.   All questions were answered. The patient knows to call the clinic with any problems, questions or concerns. The total time spent in the appointment was 30 minutes encounter with patients including review of chart and various tests results, discussions about plan of care and coordination of care plan   Heath Lark, MD 06/16/2020 4:35 PM  INTERVAL HISTORY: Please see below for problem oriented charting. She returns for further follow-up She is doing  well in terms of bone pain has resolved No recent nausea or vomiting She has mild persistent peripheral neuropathy Her gait has improved The patient denies any recent signs or symptoms of bleeding such as spontaneous epistaxis, hematuria or hematochezia. No recent changes in bowel habits  SUMMARY OF ONCOLOGIC HISTORY: Oncology History Overview Note  MSI stable   Adenosarcoma of body of uterus ([place])  03/03/2020 Initial Diagnosis   She developed PMB in early April after going through menopause at approximately age 30. She saw her PCP on 4/13 for bleeding and an ultrasound was obtained showing a thickened endometrial lining and 3-4cm mass within the endocervical canal. She was referred to [facility], started on Megace and ultimately underwent a TRH/BSO on 5/18 for abnormal uterine bleeding with resulting anemia, large prolapsing fibroid.    03/06/2020 Imaging   US pelvis Markedly thickened heterogeneous, and hypervascular endometrial complex up to 38 mm thick highly concerning for endometrial neoplasm.   Additional more focal 3.0 x 2.8 x 5.3 cm diameter mass within endocervical canal, may represent extension of above endometrial pathology versus separate endocervical tumor.   Tissue diagnosis recommended.   Nonvisualization of ovaries.   04/07/2020 Pathology Results   A. UTERUS, CERVIX AND BILATERAL FALLOPIAN TUBES AND OVARIES, HYSTERECTOMY AND BILATERAL SALPINGO-OOPHORECTOMY: - Mullerian adenosarcoma of endometrium. - Tumor limited to the uterus. - Tumor invades to less than half of the myometrium. - No involvement of adnexa. - Margins of resection are not involved. - See oncology table and comment. ONCOLOGY TABLE: UTERUS, SARCOMA: Procedure: Total hysterectomy and bilateral salpingo-oophorectomy Specimen Integrity: The uterus, cervix and bilateral fallopian tubes and ovaries were received  intact. The nodule clinically identified as vaginally prolapsed uterine fibroid was received separate  and disrupted. Tumor Size: Greatest dimension: 7.5 cm Histologic Type: Mullerian adenosarcoma with overgrowth of the epithelial and stromal components Histologic Grade: The epithelial component has proliferated to form endometrioid adenocarcinoma which ranges from low grade to high grade. The stromal component focally shows sarcomatous overgrowth. Myometrial Invasion: Present Depth of invasion: 4 mm Myometrial thickness: 30 mm Other Tissue/Organ Involvement: Not identified Margins: Uninvolved by sarcoma Lymphovascular Invasion: Not identified Regional Lymph Nodes: No lymph nodes submitted or found Pathologic Stage Classification (pTNM, AJCC 8th Edition): pT1b, pNX Additional Pathologic Findings: Adenomyosis. Leiomyomata.   04/07/2020 Surgery   PRE-OPERATIVE DIAGNOSIS:  Large prolapsed fibroid vaginally, menometrorrhagia with anemia   POST-OPERATIVE DIAGNOSIS:  Large prolapsed fibroid vaginally, menometrorrhagia with anemia.  Omental adhesions with anterior abdominal wall.   PROCEDURE:  Procedure(s): XI ROBOTIC TOTAL LAPAROSCOPIC HYSTERECTOMY, BILATERAL SALPINGO OOPHORECTOMY/LYSIS OF ADHESIONS/VAGINAL MYOMECTOMY    FINDINGS: Large necrotic vaginally prolapsed uterine myoma.  Uterus with small subserosal/intramural myoma.  Bilateral tubes post tubal ligation.  Bilateral normal ovaries.  Omental adhesions with anterior abdominal wall.   05/01/2020 Imaging   Ct scan of chest, abdomen and pelvis 1. Multiple ill-defined low density liver masses, suspicious for metastases. These could be further characterized with pre and postcontrast magnetic resonance imaging of the liver. 2. Moderate diffuse vaginal wall thickening, possibly representing edema associated with the patient's recent surgery. 3. Small hiatal hernia. 4. Minimal coronary artery atheromatous calcifications. 5. Cholelithiasis. 6. Tiny rounded area of low density in the posterior spleen, too small to characterize.   05/11/2020  Cancer Staging   Staging form: Corpus Uteri - Adenosarcoma, AJCC 8th Edition - Pathologic: Stage IB (pT1b, pN0, cM0) - Signed by Heath Lark, MD on 05/15/2020   05/14/2020 Imaging   MRI liver 1. The 2 dominant right hepatic lobe lesions have MR imaging features most consistent with benign cavernous hemangioma. The remaining tiny liver lesions are too small to characterize. Statistically, while these are likely benign, follow-up MRI in 3 months recommended to ensure stability. 2. No other findings to suggest metastatic disease.   05/22/2020 Procedure   Placement of a subcutaneous port device. Catheter tip at the superior cavoatrial junction   05/29/2020 -  Chemotherapy   The patient had carboplatin and taxol for chemotherapy treatment.       REVIEW OF SYSTEMS:   Constitutional: Denies fevers, chills or abnormal weight loss Eyes: Denies blurriness of vision Ears, nose, mouth, throat, and face: Denies mucositis or sore throat Respiratory: Denies cough, dyspnea or wheezes Cardiovascular: Denies palpitation, chest discomfort or lower extremity swelling Gastrointestinal:  Denies nausea, heartburn or change in bowel habits Skin: Denies abnormal skin rashes Lymphatics: Denies new lymphadenopathy or easy bruising Behavioral/Psych: Mood is stable, no new changes  All other systems were reviewed with the patient and are negative.  I have reviewed the past medical history, past surgical history, social history and family history with the patient and they are unchanged from previous note.  ALLERGIES:  is allergic to latex and sulfur.  MEDICATIONS:  Current Outpatient Medications  Medication Sig Dispense Refill  . cyclobenzaprine (FLEXERIL) 10 MG tablet Take 1 tablet (10 mg total) by mouth at bedtime as needed for muscle spasms. 15 tablet 0  . dexamethasone (DECADRON) 4 MG tablet Take 2 tabs at the night before and 2 tab the morning of chemotherapy, every 3 weeks, by mouth x 6 cycles 36 tablet 6   . famotidine (PEPCID) 20 MG  tablet TAKE 1 TABLET BY MOUTH TWICE DAILY FOR HEART 180 tablet 1  . hydrochlorothiazide (MICROZIDE) 12.5 MG capsule Take 1 capsule by mouth once daily 90 capsule 1  . HYDROmorphone (DILAUDID) 4 MG tablet Take 1 tablet (4 mg total) by mouth every 6 (six) hours as needed for severe pain. 30 tablet 0  . lidocaine-prilocaine (EMLA) cream Apply to affected area once 30 g 3  . losartan (COZAAR) 50 MG tablet Take 1 tablet by mouth once daily 90 tablet 1  . ondansetron (ZOFRAN) 8 MG tablet Take 1 tablet (8 mg total) by mouth every 8 (eight) hours as needed. 30 tablet 1  . prochlorperazine (COMPAZINE) 10 MG tablet Take 1 tablet (10 mg total) by mouth every 6 (six) hours as needed (Nausea or vomiting). 30 tablet 1   No current facility-administered medications for this visit.    PHYSICAL EXAMINATION: ECOG PERFORMANCE STATUS: 1 - Symptomatic but completely ambulatory  Vitals:   06/16/20 1139  BP: (!) 140/64  Pulse: 71  Resp: 18  Temp: 97.9 F (36.6 C)  SpO2: 100%   Filed Weights   06/16/20 1139  Weight: (!) 204 lb 9.6 oz (92.8 kg)    GENERAL:alert, no distress and comfortable SKIN: skin color, texture, turgor are normal, no rashes or significant lesions EYES: normal, Conjunctiva are pink and non-injected, sclera clear OROPHARYNX:no exudate, no erythema and lips, buccal mucosa, and tongue normal  NECK: supple, thyroid normal size, non-tender, without nodularity LYMPH:  no palpable lymphadenopathy in the cervical, axillary or inguinal LUNGS: clear to auscultation and percussion with normal breathing effort HEART: regular rate & rhythm and no murmurs and no lower extremity edema ABDOMEN:abdomen soft, non-tender and normal bowel sounds Musculoskeletal:no cyanosis of digits and no clubbing  NEURO: alert & oriented x 3 with fluent speech, no focal motor/sensory deficits  LABORATORY DATA:  I have reviewed the data as listed    Component Value Date/Time   NA 142  06/16/2020 1125   NA 142 04/07/2017 0810   K 3.6 06/16/2020 1125   K 3.8 04/07/2017 0810   CL 106 06/16/2020 1125   CO2 27 06/16/2020 1125   CO2 28 04/07/2017 0810   GLUCOSE 113 (H) 06/16/2020 1125   GLUCOSE 100 04/07/2017 0810   BUN 15 06/16/2020 1125   BUN 16.8 04/07/2017 0810   CREATININE 0.88 06/16/2020 1125   CREATININE 0.9 04/07/2017 0810   CALCIUM 10.2 06/16/2020 1125   CALCIUM 9.9 04/07/2017 0810   PROT 7.6 06/16/2020 1125   PROT 8.0 04/07/2017 0810   ALBUMIN 4.2 06/16/2020 1125   ALBUMIN 4.4 04/07/2017 0810   AST 16 06/16/2020 1125   AST 18 04/07/2017 0810   ALT 23 06/16/2020 1125   ALT 23 04/07/2017 0810   ALKPHOS 84 06/16/2020 1125   ALKPHOS 79 04/07/2017 0810   BILITOT 0.4 06/16/2020 1125   BILITOT 0.70 04/07/2017 0810   GFRNONAA >60 06/16/2020 1125   GFRAA >60 06/16/2020 1125    No results found for: SPEP, UPEP  Lab Results  Component Value Date   WBC 6.1 06/16/2020   NEUTROABS 2.8 06/16/2020   HGB 10.3 (L) 06/16/2020   HCT 32.8 (L) 06/16/2020   MCV 83.9 06/16/2020   PLT 61 (L) 06/16/2020      Chemistry      Component Value Date/Time   NA 142 06/16/2020 1125   NA 142 04/07/2017 0810   K 3.6 06/16/2020 1125   K 3.8 04/07/2017 0810   CL 106 06/16/2020 1125  CO2 27 06/16/2020 1125   CO2 28 04/07/2017 0810   BUN 15 06/16/2020 1125   BUN 16.8 04/07/2017 0810   CREATININE 0.88 06/16/2020 1125   CREATININE 0.9 04/07/2017 0810      Component Value Date/Time   CALCIUM 10.2 06/16/2020 1125   CALCIUM 9.9 04/07/2017 0810   ALKPHOS 84 06/16/2020 1125   ALKPHOS 79 04/07/2017 0810   AST 16 06/16/2020 1125   AST 18 04/07/2017 0810   ALT 23 06/16/2020 1125   ALT 23 04/07/2017 0810   BILITOT 0.4 06/16/2020 1125   BILITOT 0.70 04/07/2017 0810       RADIOGRAPHIC STUDIES: I have personally reviewed the radiological images as listed and agreed with the findings in the report. IR IMAGING GUIDED PORT INSERTION  Result Date: 05/22/2020 INDICATION:  59 year old with uterine cancer. Port-A-Cath needed for chemotherapy. EXAM: FLUOROSCOPIC AND ULTRASOUND GUIDED PLACEMENT OF A SUBCUTANEOUS PORT COMPARISON:  None. MEDICATIONS: Ancef 2 g; The antibiotic was administered within an appropriate time interval prior to skin puncture. ANESTHESIA/SEDATION: Versed 6.0 mg IV; Fentanyl 200 mcg IV; Moderate Sedation Time:  37 The patient was continuously monitored during the procedure by the interventional radiology nurse under my direct supervision. FLUOROSCOPY TIME:  48 seconds, 8 mGy COMPLICATIONS: None immediate. PROCEDURE: The procedure, risks, benefits, and alternatives were explained to the patient. Questions regarding the procedure were encouraged and answered. The patient understands and consents to the procedure. Patient was placed supine on the interventional table. Ultrasound confirmed a patent right internal jugular vein. Ultrasound image was saved for documentation. The right chest and neck were cleaned with a skin antiseptic and a sterile drape was placed. Maximal barrier sterile technique was utilized including caps, mask, sterile gowns, sterile gloves, sterile drape, hand hygiene and skin antiseptic. The right neck was anesthetized with 1% lidocaine. Small incision was made in the right neck with a blade. Micropuncture set was placed in the right internal jugular vein with ultrasound guidance. The micropuncture wire was used for measurement purposes. The right chest was anesthetized with 1% lidocaine with epinephrine. #15 blade was used to make an incision and a subcutaneous port pocket was formed. [place] was assembled. Subcutaneous tunnel was formed with a stiff tunneling device. The port catheter was brought through the subcutaneous tunnel. The port was placed in the subcutaneous pocket. The micropuncture set was exchanged for a peel-away sheath. The catheter was placed through the peel-away sheath and the tip was positioned at the superior  cavoatrial junction. Catheter placement was confirmed with fluoroscopy. The port was accessed and flushed with heparinized saline. The port pocket was closed using two layers of absorbable sutures and Dermabond. The vein skin site was closed using a single layer of absorbable suture and Dermabond. Sterile dressings were applied. Patient tolerated the procedure well without an immediate complication. Ultrasound and fluoroscopic images were taken and saved for this procedure. IMPRESSION: Placement of a subcutaneous port device. Catheter tip at the superior cavoatrial junction. Electronically Signed   By: Markus Daft M.D.   On: 05/22/2020 15:09

## 2020-06-16 NOTE — Assessment & Plan Note (Signed)
Despite severe pancytopenia, she is not symptomatic I told her, certainly pancytopenia is due to treatment and also we are drawing her labs 3 days in advance because of be going out of town We discussed the risk and benefits of pursuing treatment without delay and she is in agreement, provided that we proceed with dose adjustment She does not need transfusion support

## 2020-06-16 NOTE — Telephone Encounter (Signed)
Scheduled appts per 7/27 sch msg. Pt declined print out of AVS and stated she would refer to mychart.

## 2020-06-16 NOTE — Patient Instructions (Signed)

## 2020-06-16 NOTE — Assessment & Plan Note (Signed)
She has significant multiple side effects from recent chemotherapy including severe pancytopenia, bone pain and neuropathy Her thrombocytopenia is known to be chronic I recommend we proceed with treatment as scheduled but with dose adjustment She is in agreement

## 2020-06-16 NOTE — Assessment & Plan Note (Signed)
Her bone pain has resolved but we discussed the importance of taking pain medicine if needed 

## 2020-06-16 NOTE — Assessment & Plan Note (Signed)
she has mild peripheral neuropathy, likely related to side effects of treatment. °I plan to reduce the dose of treatment as outlined above.  °I explained to the patient the rationale of this strategy and reassured the patient it would not compromise the efficacy of treatment ° °

## 2020-06-17 ENCOUNTER — Encounter: Payer: Self-pay | Admitting: Hematology and Oncology

## 2020-06-17 NOTE — Progress Notes (Signed)
Pt's daughter(Chandra) called with patient present to discuss what is needed to apply for [employer]. Verbalized what would be needed from patient and advised it may be emailed.  She has my card for any additional financial questions or concerns.

## 2020-06-18 ENCOUNTER — Ambulatory Visit: Payer: PRIVATE HEALTH INSURANCE

## 2020-06-18 ENCOUNTER — Other Ambulatory Visit: Payer: Self-pay

## 2020-06-18 ENCOUNTER — Ambulatory Visit: Payer: PRIVATE HEALTH INSURANCE | Admitting: Physical Therapy

## 2020-06-18 DIAGNOSIS — Z483 Aftercare following surgery for neoplasm: Secondary | ICD-10-CM

## 2020-06-18 DIAGNOSIS — R209 Unspecified disturbances of skin sensation: Secondary | ICD-10-CM | POA: Diagnosis not present

## 2020-06-18 DIAGNOSIS — R29898 Other symptoms and signs involving the musculoskeletal system: Secondary | ICD-10-CM

## 2020-06-18 NOTE — Patient Instructions (Signed)
Piriformis Stretch, Sitting    Sit, one ankle on opposite knee, same-side hand on crossed knee. Push down on knee, keeping spine straight. Lean torso forward, with flat back, until tension is felt in hamstrings and gluteals of crossed-leg side. Hold _20-30__ seconds.  Can also do laying down. Repeat _3__ times per session. Do _2-3__ sessions per day.  Hamstring Stretch: Active    Use yoga strap around ball of foot. ttempt to straighten knee until a comfortable stretch is felt in back of thigh. Hold _20-30___ seconds. Can also do in sitting with heel on floor and knee straight, lean forward leading with chest.  Repeat _3___ times per set. Do _2-3___ sessions per day.  Lower Trunk Rotation    Bring both knees in to chest keeping feet on bed. Rotate from side to side, keeping knees together and feet on floor and tighten tummy to pull legs back up.. Repeat 2-3times per set. Hold 20-30 seconds. Do _2-3___ sessions per day.     PELVIC TILT: Posterior    Tighten abdominals, flatten low back. _10__ reps per set, holding __5-10__ seconds, __1-2_ sets per day.  Cancer Rehab [phone]

## 2020-06-18 NOTE — Therapy (Signed)
[place], [place], [address] Phone: [phone]   Fax:  [phone]  Physical Therapy Treatment  Patient Details  Name: Rachel Rivas MRN: [id number] Date of Birth: Aug 16, 1961 Referring Provider (PT): Dr. Alvy Bimler    Encounter Date: 06/18/2020   PT End of Session - 06/18/20 1107    Visit Number 2    Number of Visits 17    Date for PT Re-Evaluation 08/12/20    PT Start Time 1010   pt arrived late   PT Stop Time 1106    PT Time Calculation (min) 56 min    Activity Tolerance Patient tolerated treatment well    Behavior During Therapy [facility] for tasks assessed/performed           Past Medical History:  Diagnosis Date  . Allergy    occasionally  . Anemia    yrs ago, low platelets  . Arthritis    RA  . Blood transfusion without reported diagnosis    1985 with c section  . Borderline diabetic   . Dyspnea    While taking Megace  . GERD (gastroesophageal reflux disease)   . Hypertension   . Obstructive sleep apnea syndrome 01/07/2020  . Thrombocytopenia ([place])     Past Surgical History:  Procedure Laterality Date  . CESAREAN SECTION    . COLONOSCOPY    . IR IMAGING GUIDED PORT INSERTION  05/22/2020  . MYOMECTOMY N/A 04/07/2020   Procedure: VAGINAL MYOMECTOMY;  Surgeon: Princess Bruins, MD;  Location: [facility];  Service: Gynecology;  Laterality: N/A;  . POLYPECTOMY    . ROBOTIC ASSISTED TOTAL HYSTERECTOMY WITH BILATERAL SALPINGO OOPHERECTOMY Bilateral 04/07/2020   Procedure: XI ROBOTIC TOTAL LAPAROSCOPIC HYSTERECTOMY, BILATERAL SALPINGO OOPHORECTOMY;  Surgeon: Princess Bruins, MD;  Location: [place];  Service: Gynecology;  Laterality: Bilateral;  . STERILIZATION    . TUBAL LIGATION      There were no vitals filed for this visit.   Subjective Assessment - 06/18/20 1014    Subjective My back always hurts, but it got worse since the first chemo treatment.  Bending over is when it bothers me the most.    Pertinent History diagnosis of uterine cancer with laparoscopic hysterectomy 04/07/2020( all cancer was removed) she has started chemotherapy    Patient Stated Goals To get legs stronger to deal better with chemotherapy    Currently in Pain? No/denies   just sore, hurts more with bending over                            [facility] Adult PT Treatment/Exercise - 06/18/20 0001      Self-Care   Self-Care Lifting;Posture    Lifting Demonstrated and educated pt in proper lifting technique (golfers lift and squat instead of bending over)    Posture Demonstrated proper posture with ADLs(propping foot on counter under sink when washing dishes)      Lumbar Exercises: Stretches   Active Hamstring Stretch Right;Left    Single Knee to Chest Stretch Right;Left;2 reps;30 seconds    Figure 4 Stretch 2 reps;30 seconds   with pillowcase on ankle to assist with pull   Figure 4 Stretch Limitations in supine with pillowcase over ankle to assist with pulling leg      Lumbar Exercises: Supine   Pelvic Tilt 10 reps;5 seconds    Pelvic Tilt Limitations Pt returned therapist demo      Modalities  Modalities Moist Heat      Moist Heat Therapy   Number Minutes Moist Heat 15 Minutes   during self care   Moist Heat Location Lumbar Spine      Manual Therapy   Manual Therapy Soft tissue mobilization    Soft tissue mobilization In Lt S/L with coconut oil to bil lumbar spine and Rt>Lt piriformis area (min tenderness at Lt so stopped)                  PT Education - 06/18/20 1106    Education Details Bil hip and lumbar flexibility, and core stab with pelvic tilt (forgot to add SKTC to handout but instructed pt and daughter to include)    Person(s) Educated Patient;Child(ren)   daughter   Methods Explanation;Demonstration;Handout    Comprehension Verbalized understanding;Returned demonstration;Need further instruction            PT Short  Term Goals - 06/11/20 1714      PT SHORT TERM GOAL #1   Title Pt will be indpendent in a HEP for low back and hip streching to help with back pain             PT Long Term Goals - 06/11/20 1715      PT LONG TERM GOAL #1   Title Pt will be independent in a home exercise program for general strength and endurance so that she will be able to tolerate her chemotherapy    Time 8    Period Weeks    Status New      PT LONG TERM GOAL #2   Title Pt will maintain ablitiy to do  10 repetitions of sit to stand in 30 seconds after 4 rounds of chemotherapy    Baseline 10 reps in 30 sec. on 06/11/2020    Time 8    Period Weeks    Status New      PT LONG TERM GOAL #3   Title Pt will maintan TUG < 7 seconds after 4 rounds of chemotherapy    Baseline 6.05 on 06/11/2020    Time 8    Period Weeks    Status New                 Plan - 06/18/20 1108    Clinical Impression Statement First session of treatment today for low back tightness.Pt tolerated all very well. Was engaged and asking questions during education about body mechanics with lifting and ADLs. Reported feeling good stretches with all activities today with no pain and was encouraged to avoid pain. Also did well with beginning of core stabs instruction with pelvic tilt. Ended session with manual therapy to bil lumbar paraspinals and Rt piriformis area. Pt reports overall feeling much better at end of session. Daughter was present as well and will be helping mother at home with HEP so was listending and engaged for all instructions.    Personal Factors and Comorbidities Comorbidity 3+    Comorbidities history of low back pain, recent laparoscopic surgery, ongoing chemo.    Stability/Clinical Decision Making Evolving/Moderate complexity    Rehab Potential Good    PT Frequency 2x / week   weeks 2 and 3 after chemo   PT Duration 8 weeks    PT Treatment/Interventions ADLs/Self Care Home Management;Moist Heat;Therapeutic  activities;Functional mobility training;Therapeutic exercise;Patient/family education;Manual techniques;Passive range of motion    PT Next Visit Plan moist heat to low back, soft tissue work myofascial release as needed, teach stretches to  hip flexors and low back, core exercises, progress to core stabs including LE exs next    PT Home Exercise Plan Bil hip flexibility and pelvic tilt    Consulted and Agree with Plan of Care Patient           Patient will benefit from skilled therapeutic intervention in order to improve the following deficits and impairments:  Increased fascial restricitons, Impaired flexibility, Impaired perceived functional ability, Decreased range of motion, Increased muscle spasms  Visit Diagnosis: Unspecified disturbances of skin sensation  Other symptoms and signs involving the musculoskeletal system  Aftercare following surgery for neoplasm     Problem List Patient Active Problem List   Diagnosis Date Noted  . Pancytopenia, acquired ([place]) 06/16/2020  . Bone pain 06/03/2020  . Generalized weakness 06/03/2020  . Peripheral neuropathy due to chemotherapy ([place]) 06/03/2020  . Malignant cachexia ([place]) 06/03/2020  . Dysuria 06/01/2020  . Liver lesion 05/13/2020  . Goals of care, counseling/discussion 05/13/2020  . Adenosarcoma of body of uterus ([place]) 04/24/2020  . Post-operative state 04/07/2020  . Postoperative state 04/07/2020  . Post-menopausal bleeding 03/26/2020  . Family history of cancer in mother 03/03/2020  . Grade 2 ankle sprain, left, initial encounter 01/07/2020  . Obstructive sleep apnea syndrome 01/07/2020  . Former tobacco use 11/05/2019  . Class 2 severe obesity due to excess calories with serious comorbidity and body mass index (BMI) of 36.0 to 36.9 in adult [facility]) 11/05/2019  . Shortness of breath 10/28/2019  . Gastroesophageal reflux disease 10/28/2019  . Lateral epicondylitis of right elbow 03/21/2019  . Abnormal CXR 02/21/2019  .  Persistent cough for 3 weeks or longer 02/13/2019  . BPPV (benign paroxysmal positional vertigo), right 05/31/2018  . ETD (Eustachian tube dysfunction), bilateral 03/23/2018  . Atopic dermatitis 02/28/2017  . Seborrheic keratoses, inflamed 02/28/2017  . Rheumatoid arthritis ([place]) 03/06/2015  . Thrombocytopenia ([place]) 02/26/2015  . Prediabetes 11/04/2014  . Acute sinus infection 05/26/2014  . Essential hypertension     Otelia Limes, PTA 06/18/2020, 12:26 PM  [place], [place], [address] Phone: [phone]   Fax:  [phone]  Name: Rachel Rivas MRN: [id number] Date of Birth: 14-Dec-1960

## 2020-06-19 ENCOUNTER — Inpatient Hospital Stay: Payer: PRIVATE HEALTH INSURANCE

## 2020-06-19 ENCOUNTER — Other Ambulatory Visit: Payer: Self-pay

## 2020-06-19 VITALS — BP 148/98 | HR 93 | Temp 98.2°F | Resp 17 | Wt 203.4 lb

## 2020-06-19 DIAGNOSIS — Z5111 Encounter for antineoplastic chemotherapy: Secondary | ICD-10-CM | POA: Diagnosis not present

## 2020-06-19 DIAGNOSIS — K769 Liver disease, unspecified: Secondary | ICD-10-CM

## 2020-06-19 DIAGNOSIS — C549 Malignant neoplasm of corpus uteri, unspecified: Secondary | ICD-10-CM

## 2020-06-19 DIAGNOSIS — Z7189 Other specified counseling: Secondary | ICD-10-CM

## 2020-06-19 MED ORDER — SODIUM CHLORIDE 0.9 % IV SOLN
150.0000 mg | Freq: Once | INTRAVENOUS | Status: AC
  Administered 2020-06-19: 150 mg via INTRAVENOUS
  Filled 2020-06-19: qty 150

## 2020-06-19 MED ORDER — FAMOTIDINE IN NACL 20-0.9 MG/50ML-% IV SOLN
INTRAVENOUS | Status: AC
  Filled 2020-06-19: qty 50

## 2020-06-19 MED ORDER — PALONOSETRON HCL INJECTION 0.25 MG/5ML
0.2500 mg | Freq: Once | INTRAVENOUS | Status: AC
  Administered 2020-06-19: 0.25 mg via INTRAVENOUS

## 2020-06-19 MED ORDER — PALONOSETRON HCL INJECTION 0.25 MG/5ML
INTRAVENOUS | Status: AC
  Filled 2020-06-19: qty 5

## 2020-06-19 MED ORDER — DIPHENHYDRAMINE HCL 50 MG/ML IJ SOLN
50.0000 mg | Freq: Once | INTRAMUSCULAR | Status: AC
  Administered 2020-06-19: 50 mg via INTRAVENOUS

## 2020-06-19 MED ORDER — HEPARIN SOD (PORK) LOCK FLUSH 100 UNIT/ML IV SOLN
500.0000 [IU] | Freq: Once | INTRAVENOUS | Status: DC | PRN
  Filled 2020-06-19: qty 5

## 2020-06-19 MED ORDER — DIPHENHYDRAMINE HCL 50 MG/ML IJ SOLN
INTRAMUSCULAR | Status: AC
  Filled 2020-06-19: qty 1

## 2020-06-19 MED ORDER — SODIUM CHLORIDE 0.9 % IV SOLN
10.0000 mg | Freq: Once | INTRAVENOUS | Status: AC
  Administered 2020-06-19: 10 mg via INTRAVENOUS
  Filled 2020-06-19: qty 10

## 2020-06-19 MED ORDER — FAMOTIDINE IN NACL 20-0.9 MG/50ML-% IV SOLN
20.0000 mg | Freq: Once | INTRAVENOUS | Status: AC
  Administered 2020-06-19: 20 mg via INTRAVENOUS

## 2020-06-19 MED ORDER — SODIUM CHLORIDE 0.9 % IV SOLN
Freq: Once | INTRAVENOUS | Status: AC
  Filled 2020-06-19: qty 250

## 2020-06-19 MED ORDER — SODIUM CHLORIDE 0.9 % IV SOLN
620.5000 mg | Freq: Once | INTRAVENOUS | Status: AC
  Administered 2020-06-19: 620.5 mg via INTRAVENOUS
  Filled 2020-06-19: qty 62.05

## 2020-06-19 MED ORDER — SODIUM CHLORIDE 0.9% FLUSH
10.0000 mL | INTRAVENOUS | Status: DC | PRN
  Administered 2020-06-19: 10 mL
  Filled 2020-06-19: qty 10

## 2020-06-19 MED ORDER — SODIUM CHLORIDE 0.9 % IV SOLN
131.2500 mg/m2 | Freq: Once | INTRAVENOUS | Status: AC
  Administered 2020-06-19: 264 mg via INTRAVENOUS
  Filled 2020-06-19: qty 44

## 2020-06-19 NOTE — Progress Notes (Signed)
Heparin was not given as the nurse took out the needle and forgot to give the heparin. Offered to reaccess the port, but the patient refused.  Next treatment is within the 6 weeks of a port flush, and this was explained to the patient. The patient opted to wait until the next treatment.

## 2020-06-19 NOTE — Patient Instructions (Signed)
[facility] °Discharge Instructions for Patients Receiving Chemotherapy ° °Today you received the following chemotherapy agents Taxol; Carboplatin ° °To help prevent nausea and vomiting after your treatment, we encourage you to take your nausea medication as directed °  °If you develop nausea and vomiting that is not controlled by your nausea medication, call the clinic.  ° °BELOW ARE SYMPTOMS THAT SHOULD BE REPORTED IMMEDIATELY: °· *FEVER GREATER THAN 100.5 F °· *CHILLS WITH OR WITHOUT FEVER °· NAUSEA AND VOMITING THAT IS NOT CONTROLLED WITH YOUR NAUSEA MEDICATION °· *UNUSUAL SHORTNESS OF BREATH °· *UNUSUAL BRUISING OR BLEEDING °· TENDERNESS IN MOUTH AND THROAT WITH OR WITHOUT PRESENCE OF ULCERS °· *URINARY PROBLEMS °· *BOWEL PROBLEMS °· UNUSUAL RASH °Items with * indicate a potential emergency and should be followed up as soon as possible. ° °Feel free to call the clinic should you have any questions or concerns. The clinic phone number is [phone]. ° °Please show the CHEMO ALERT CARD at check-in to the Emergency Department and triage nurse. ° ° °

## 2020-06-30 ENCOUNTER — Other Ambulatory Visit: Payer: Self-pay

## 2020-06-30 ENCOUNTER — Ambulatory Visit: Payer: PRIVATE HEALTH INSURANCE | Attending: Hematology and Oncology | Admitting: Physical Therapy

## 2020-06-30 ENCOUNTER — Encounter: Payer: Self-pay | Admitting: Physical Therapy

## 2020-06-30 DIAGNOSIS — R29898 Other symptoms and signs involving the musculoskeletal system: Secondary | ICD-10-CM | POA: Diagnosis present

## 2020-06-30 DIAGNOSIS — Z483 Aftercare following surgery for neoplasm: Secondary | ICD-10-CM | POA: Diagnosis present

## 2020-06-30 DIAGNOSIS — R209 Unspecified disturbances of skin sensation: Secondary | ICD-10-CM | POA: Insufficient documentation

## 2020-06-30 NOTE — Patient Instructions (Signed)

## 2020-06-30 NOTE — Therapy (Signed)
[place], [place], [address] Phone: [phone]   Fax:  [phone]  Physical Therapy Treatment  Patient Details  Name: Rachel Rivas MRN: [id number] Date of Birth: 08/19/1961 Referring Provider (PT): Dr. Alvy Bimler    Encounter Date: 06/30/2020   PT End of Session - 06/30/20 1551    Visit Number 3    Number of Visits 17    Date for PT Re-Evaluation 08/12/20    PT Start Time 1500    PT Stop Time 1545    PT Time Calculation (min) 45 min    Activity Tolerance Patient tolerated treatment well    Behavior During Therapy [facility] for tasks assessed/performed           Past Medical History:  Diagnosis Date  . Allergy    occasionally  . Anemia    yrs ago, low platelets  . Arthritis    RA  . Blood transfusion without reported diagnosis    1985 with c section  . Borderline diabetic   . Dyspnea    While taking Megace  . GERD (gastroesophageal reflux disease)   . Hypertension   . Obstructive sleep apnea syndrome 01/07/2020  . Thrombocytopenia ([place])     Past Surgical History:  Procedure Laterality Date  . CESAREAN SECTION    . COLONOSCOPY    . IR IMAGING GUIDED PORT INSERTION  05/22/2020  . MYOMECTOMY N/A 04/07/2020   Procedure: VAGINAL MYOMECTOMY;  Surgeon: Princess Bruins, MD;  Location[facility];  Service: Gynecology;  Laterality: N/A;  . POLYPECTOMY    . ROBOTIC ASSISTED TOTAL HYSTERECTOMY WITH BILATERAL SALPINGO OOPHERECTOMY Bilateral 04/07/2020   Procedure: XI ROBOTIC TOTAL LAPAROSCOPIC HYSTERECTOMY, BILATERAL SALPINGO OOPHORECTOMY;  Surgeon: Princess Bruins, MD;  Location: [place];  Service: Gynecology;  Laterality: Bilateral;  . STERILIZATION    . TUBAL LIGATION      There were no vitals filed for this visit.   Subjective Assessment - 06/30/20 1502    Subjective Pt says she had her second treatment on Friday and she did better, Her back is still  hurting a little bit    Pertinent History diagnosis of uterine cancer with laparoscopic hysterectomy 04/07/2020( all cancer was removed) she has started chemotherapy    Patient Stated Goals To get legs stronger to deal better with chemotherapy    Currently in Pain? Yes    Pain Score 4     Pain Location Back    Pain Orientation Right;Left    Pain Descriptors / Indicators Sore    Pain Type Chronic pain                             [facility] Adult PT Treatment/Exercise - 06/30/20 0001      Lumbar Exercises: Stretches   Figure 4 Stretch 4 reps;10 seconds      Lumbar Exercises: Supine   Pelvic Tilt 10 reps    Bent Knee Raise 10 reps    Bridge 5 reps    Other Supine Lumbar Exercises knee drops     Other Supine Lumbar Exercises knee to chest with leg straight       Modalities   Modalities Moist Heat      Moist Heat Therapy   Number Minutes Moist Heat 15 Minutes    Moist Heat Location Lumbar Spine      Manual Therapy   Manual Therapy Soft tissue mobilization  Soft tissue mobilization In Lt S/L with coconut oil to bil lumbar spine and Rt>Lt piriformis area (min tenderness at Lt so stopped)                  PT Education - 06/30/20 1550    Education Details progressive transverse abdominal exercise    Person(s) Educated Patient    Methods Explanation;Handout    Comprehension Verbalized understanding            PT Short Term Goals - 06/11/20 1714      PT SHORT TERM GOAL #1   Title Pt will be indpendent in a HEP for low back and hip streching to help with back pain             PT Long Term Goals - 06/11/20 1715      PT LONG TERM GOAL #1   Title Pt will be independent in a home exercise program for general strength and endurance so that she will be able to tolerate her chemotherapy    Time 8    Period Weeks    Status New      PT LONG TERM GOAL #2   Title Pt will maintain ablitiy to do  10 repetitions of sit to stand in 30 seconds after 4  rounds of chemotherapy    Baseline 10 reps in 30 sec. on 06/11/2020    Time 8    Period Weeks    Status New      PT LONG TERM GOAL #3   Title Pt will maintan TUG < 7 seconds after 4 rounds of chemotherapy    Baseline 6.05 on 06/11/2020    Time 8    Period Weeks    Status New                 Plan - 06/30/20 1551    Clinical Impression Statement Progressed transverse abdominal exercises in supine today and received relieft moist head and soft tissue work to back.  Pt reported feeling much better after session    Personal Factors and Comorbidities Comorbidity 3+    Comorbidities history of low back pain, recent laparoscopic surgery, ongoing chemo.    Stability/Clinical Decision Making Evolving/Moderate complexity    Rehab Potential Good    PT Frequency 2x / week    PT Duration 8 weeks    PT Treatment/Interventions ADLs/Self Care Home Management;Moist Heat;Therapeutic activities;Functional mobility training;Therapeutic exercise;Patient/family education;Manual techniques;Passive range of motion    PT Next Visit Plan continue with abdominal exercise, progress to sidelying hip exercies and back stretches. standing hip felxor stretches  moist heat to low back, soft tissue work myofascial release as needed, teach stretches to hip flexors and low back, core exercises, progress to core stabs including LE exs next    PT Home Exercise Plan Bil hip flexibility and pelvic tilt    Consulted and Agree with Plan of Care Patient           Patient will benefit from skilled therapeutic intervention in order to improve the following deficits and impairments:  Increased fascial restricitons, Impaired flexibility, Impaired perceived functional ability, Decreased range of motion, Increased muscle spasms  Visit Diagnosis: Unspecified disturbances of skin sensation  Other symptoms and signs involving the musculoskeletal system  Aftercare following surgery for neoplasm     Problem List Patient  Active Problem List   Diagnosis Date Noted  . Pancytopenia, acquired ([place]) 06/16/2020  . Bone pain 06/03/2020  . Generalized weakness 06/03/2020  .  Peripheral neuropathy due to chemotherapy ([place]) 06/03/2020  . Malignant cachexia ([place]) 06/03/2020  . Dysuria 06/01/2020  . Liver lesion 05/13/2020  . Goals of care, counseling/discussion 05/13/2020  . 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 Post-operative state 04/07/2020  . Postoperative state 04/07/2020  . Post-menopausal bleeding 03/26/2020  . Family history of cancer in mother 03/03/2020  . Grade 2 ankle sprain, left, initial encounter 01/07/2020  . Obstructive sleep apnea syndrome 01/07/2020  . Former tobacco use 11/05/2019  . Class 2 severe obesity due to excess calories with serious comorbidity and body mass index (BMI) of 36.0 to 36.9 in adult [facility]) 11/05/2019  . Shortness of breath 10/28/2019  . Gastroesophageal reflux disease 10/28/2019  . Lateral epicondylitis of right elbow 03/21/2019  . Abnormal CXR 02/21/2019  . Persistent cough for 3 weeks or longer 02/13/2019  . BPPV (benign paroxysmal positional vertigo), right 05/31/2018  . ETD (Eustachian tube dysfunction), bilateral 03/23/2018  . Atopic dermatitis 02/28/2017  . Seborrheic keratoses, inflamed 02/28/2017  . Rheumatoid arthritis () 03/06/2015  . Thrombocytopenia ([place]) 02/26/2015  . Prediabetes 11/04/2014  . Acute sinus infection 05/26/2014  . Essential hypertension    Donato Heinz. Owens Shark PT  Norwood Levo 06/30/2020, 3:54 PM  [place], [place], [address] Phone: [phone]   Fax:  [phone]  Name: Axelle Szwed MRN: [id number] Date of Birth: Jul 14, 1961

## 2020-07-01 ENCOUNTER — Encounter: Payer: PRIVATE HEALTH INSURANCE | Admitting: Physical Therapy

## 2020-07-06 ENCOUNTER — Ambulatory Visit: Payer: PRIVATE HEALTH INSURANCE | Admitting: Physical Therapy

## 2020-07-06 ENCOUNTER — Other Ambulatory Visit: Payer: Self-pay

## 2020-07-06 DIAGNOSIS — R209 Unspecified disturbances of skin sensation: Secondary | ICD-10-CM | POA: Diagnosis not present

## 2020-07-06 DIAGNOSIS — R29898 Other symptoms and signs involving the musculoskeletal system: Secondary | ICD-10-CM

## 2020-07-06 DIAGNOSIS — Z483 Aftercare following surgery for neoplasm: Secondary | ICD-10-CM

## 2020-07-06 NOTE — Therapy (Signed)
[place], [place], [address] Phone: [phone]   Fax:  [phone]  Physical Therapy Treatment  Patient Details  Name: Rachel Rivas MRN: [id number] Date of Birth: Oct 08, 1961 Referring Provider (PT): Dr. Alvy Bimler    Encounter Date: 07/06/2020   PT End of Session - 07/06/20 1017    Visit Number 4    Number of Visits 17    Date for PT Re-Evaluation 08/12/20    PT Start Time 0900    PT Stop Time 0945    PT Time Calculation (min) 45 min    Activity Tolerance Patient tolerated treatment well    Behavior During Therapy [facility] for tasks assessed/performed           Past Medical History:  Diagnosis Date  . Allergy    occasionally  . Anemia    yrs ago, low platelets  . Arthritis    RA  . Blood transfusion without reported diagnosis    1985 with c section  . Borderline diabetic   . Dyspnea    While taking Megace  . GERD (gastroesophageal reflux disease)   . Hypertension   . Obstructive sleep apnea syndrome 01/07/2020  . Thrombocytopenia ([place])     Past Surgical History:  Procedure Laterality Date  . CESAREAN SECTION    . COLONOSCOPY    . IR IMAGING GUIDED PORT INSERTION  05/22/2020  . MYOMECTOMY N/A 04/07/2020   Procedure: VAGINAL MYOMECTOMY;  Surgeon: Princess Bruins, MD;  Location: [facility];  Service: Gynecology;  Laterality: N/A;  . POLYPECTOMY    . ROBOTIC ASSISTED TOTAL HYSTERECTOMY WITH BILATERAL SALPINGO OOPHERECTOMY Bilateral 04/07/2020   Procedure: XI ROBOTIC TOTAL LAPAROSCOPIC HYSTERECTOMY, BILATERAL SALPINGO OOPHORECTOMY;  Surgeon: Princess Bruins, MD;  Location: [place];  Service: Gynecology;  Laterality: Bilateral;  . STERILIZATION    . TUBAL LIGATION      There were no vitals filed for this visit.   Subjective Assessment - 07/06/20 1006    Subjective Pt says she is feeling better, she did some walking about this weekend and found that  her right hip had more pain    Pertinent History diagnosis of uterine cancer with laparoscopic hysterectomy 04/07/2020( all cancer was removed) she has started chemotherapy    Patient Stated Goals To get legs stronger to deal better with chemotherapy    Currently in Pain? No/denies   pt says her back is feeling better                            [facility] Adult PT Treatment/Exercise - 07/06/20 0001      Exercises   Exercises Lumbar;Knee/Hip;Other Exercises    Other Exercises  reviewed all exercises in [place] program and modified as pt had difficulty with straight leg raise in supine and right hip abduciton in left sidelying due to pain.  Also, did not do standing hip abduction as it caused increased pain in back       Lumbar Exercises: Stretches   Other Lumbar Stretch Exercise sitting cat cow stretch       Lumbar Exercises: Supine   Pelvic Tilt 10 reps    Clam 5 reps   resisted with green theraband around thighs    Bridge 5 reps    Straight Leg Raise 5 reps    Straight Leg Raises Limitations pt with difficulty due to weakness       Knee/Hip Exercises: Standing  Knee Flexion Strengthening;Right;Left;5 reps    Hip Flexion Stengthening;Right;Left;5 reps    Hip Abduction Stengthening;Right;Left;5 reps    Extension Limitations pt not able to do this due to increase in back pain       Moist Heat Therapy   Number Minutes Moist Heat 10 Minutes    Moist Heat Location Lumbar Spine      Manual Therapy   Manual Therapy Myofascial release    Myofascial Release to right posterior hip and low back to decrease pain                   PT Education - 07/06/20 0957    Education Details progressed to [place] program with standing hip    Person(s) Educated Patient;Parent(s);Caregiver(s)    Methods Explanation;Demonstration;Handout    Comprehension Verbalized understanding;Returned demonstration            PT Short Term Goals - 06/11/20 1714      PT SHORT TERM  GOAL #1   Title Pt will be indpendent in a HEP for low back and hip streching to help with back pain             PT Long Term Goals - 06/11/20 1715      PT LONG TERM GOAL #1   Title Pt will be independent in a home exercise program for general strength and endurance so that she will be able to tolerate her chemotherapy    Time 8    Period Weeks    Status New      PT LONG TERM GOAL #2   Title Pt will maintain ablitiy to do  10 repetitions of sit to stand in 30 seconds after 4 rounds of chemotherapy    Baseline 10 reps in 30 sec. on 06/11/2020    Time 8    Period Weeks    Status New      PT LONG TERM GOAL #3   Title Pt will maintan TUG < 7 seconds after 4 rounds of chemotherapy    Baseline 6.05 on 06/11/2020    Time 8    Period Weeks    Status New                 Plan - 07/06/20 1018    Clinical Impression Statement Pt has been improving in exercise as this is her 3rd week after treatment.  She will have another chemo treatment on Friday.  She feels that her hip pain is coming from her back pain, but did have some increased pain with hip abducti1on in left sidlying that resolved at end of session. She continues to have tightness in low back and right hip that resolves slightly with manual work    Personal Factors and Comorbidities Comorbidity 3+    Comorbidities history of low back pain, recent laparoscopic surgery, ongoing chemo.    Stability/Clinical Decision Making Evolving/Moderate complexity    PT Frequency 2x / week    PT Duration 8 weeks    PT Treatment/Interventions ADLs/Self Care Home Management;Moist Heat;Therapeutic activities;Functional mobility training;Therapeutic exercise;Patient/family education;Manual techniques;Passive range of motion    PT Next Visit Plan Try nustep nex session . continue with abdominal exercise, progress to sidelying hip exercies and back stretches. standing hip felxor stretches  moist heat to low back, soft tissue work myofascial  release as needed, teach stretches to hip flexors and low back, core exercises, progress to core stabs including LE exs next    PT Home Exercise Plan Bil hip  flexibility and pelvic tilt, Access Code: [email or web address] to add standing hip    Consulted and Agree with Plan of Care Patient           Patient will benefit from skilled therapeutic intervention in order to improve the following deficits and impairments:  Increased fascial restricitons, Impaired flexibility, Impaired perceived functional ability, Decreased range of motion, Increased muscle spasms  Visit Diagnosis: Unspecified disturbances of skin sensation  Other symptoms and signs involving the musculoskeletal system  Aftercare following surgery for neoplasm     Problem List Patient Active Problem List   Diagnosis Date Noted  . Pancytopenia, acquired ([place]) 06/16/2020  . Bone pain 06/03/2020  . Generalized weakness 06/03/2020  . Peripheral neuropathy due to chemotherapy ([place]) 06/03/2020  . Malignant cachexia ([place]) 06/03/2020  . Dysuria 06/01/2020  . Liver lesion 05/13/2020  . Goals of care, counseling/discussion 05/13/2020  . 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 Post-operative state 04/07/2020  . Postoperative state 04/07/2020  . Post-menopausal bleeding 03/26/2020  . Family history of cancer in mother 03/03/2020  . Grade 2 ankle sprain, left, initial encounter 01/07/2020  . Obstructive sleep apnea syndrome 01/07/2020  . Former tobacco use 11/05/2019  . Class 2 severe obesity due to excess calories with serious comorbidity and body mass index (BMI) of 36.0 to 36.9 in adult [facility]) 11/05/2019  . Shortness of breath 10/28/2019  . Gastroesophageal reflux disease 10/28/2019  . Lateral epicondylitis of right elbow 03/21/2019  . Abnormal CXR 02/21/2019  . Persistent cough for 3 weeks or longer 02/13/2019  . BPPV (benign paroxysmal positional vertigo), right 05/31/2018  . ETD (Eustachian tube dysfunction), bilateral  03/23/2018  . Atopic dermatitis 02/28/2017  . Seborrheic keratoses, inflamed 02/28/2017  . Rheumatoid arthritis ([place]) 03/06/2015  . Thrombocytopenia ([place]) 02/26/2015  . Prediabetes 11/04/2014  . Acute sinus infection 05/26/2014  . Essential hypertension    Donato Heinz. Owens Shark PT  Norwood Levo 07/06/2020, 10:24 AM  [place], [place], [address] Phone: [phone]   Fax:  [phone]  Name: Rachel Rivas MRN: [id number] Date of Birth: 1961/03/04

## 2020-07-06 NOTE — Patient Instructions (Addendum)
   Access Code: [email or web address] URL: [email or web address] Date: 07/06/2020 Prepared by: Maudry Diego  Program Notes Focus on activating the core muscles before each exercise. Make sure to breathe as you activate and if you feel like you lose the core activation then just stop and start again after reactivating.   Exercises Supine Posterior Pelvic Tilt - 1 x daily - 7 x weekly - 10 reps - 1 sets Supine 90/90 Alternating Toe Touch - 1 x daily - 5 x weekly - 10 reps - 3 hold Supine Transversus Abdominis Bracing with Leg Extension - 1 x daily - 7 x weekly - 10 reps - 1 sets - 6 seconds-10 seconds hold Supine Bridge - 1 x daily - 7 x weekly - 10 reps Hooklying Isometric Clamshell - 1 x daily - 7 x weekly - 10 reps - 3seconds hold Supine Straight Leg Raises - 1 x daily - 7 x weekly - 10 reps - 3 sets Sidelying Hip Abduction - 1 x daily - 7 x weekly - 5-10 reps - 1 sets Standing Hip Abduction with Counter Support - 1 x daily - 7 x weekly - 10 reps - 1 sets Standing Knee Flexion with Counter Support - 1 x daily - 7 x weekly - 10 reps - 3 sets Seated Cat Camel - 1 x daily - 7 x weekly - 3 sets - 10 reps

## 2020-07-08 ENCOUNTER — Encounter: Payer: PRIVATE HEALTH INSURANCE | Admitting: Physical Therapy

## 2020-07-09 ENCOUNTER — Ambulatory Visit: Payer: PRIVATE HEALTH INSURANCE | Admitting: Oncology

## 2020-07-09 ENCOUNTER — Other Ambulatory Visit: Payer: PRIVATE HEALTH INSURANCE

## 2020-07-10 ENCOUNTER — Inpatient Hospital Stay (HOSPITAL_BASED_OUTPATIENT_CLINIC_OR_DEPARTMENT_OTHER): Payer: PRIVATE HEALTH INSURANCE | Admitting: Hematology and Oncology

## 2020-07-10 ENCOUNTER — Inpatient Hospital Stay: Payer: PRIVATE HEALTH INSURANCE

## 2020-07-10 ENCOUNTER — Inpatient Hospital Stay: Payer: PRIVATE HEALTH INSURANCE | Attending: Gynecologic Oncology

## 2020-07-10 ENCOUNTER — Other Ambulatory Visit: Payer: Self-pay

## 2020-07-10 ENCOUNTER — Encounter: Payer: Self-pay | Admitting: Hematology and Oncology

## 2020-07-10 ENCOUNTER — Telehealth: Payer: Self-pay | Admitting: Hematology and Oncology

## 2020-07-10 ENCOUNTER — Other Ambulatory Visit: Payer: Self-pay | Admitting: Hematology and Oncology

## 2020-07-10 VITALS — BP 133/61 | HR 81 | Temp 98.4°F | Resp 18 | Ht 63.0 in | Wt 204.4 lb

## 2020-07-10 DIAGNOSIS — Z79818 Long term (current) use of other agents affecting estrogen receptors and estrogen levels: Secondary | ICD-10-CM | POA: Diagnosis not present

## 2020-07-10 DIAGNOSIS — C55 Malignant neoplasm of uterus, part unspecified: Secondary | ICD-10-CM

## 2020-07-10 DIAGNOSIS — Z5111 Encounter for antineoplastic chemotherapy: Secondary | ICD-10-CM | POA: Insufficient documentation

## 2020-07-10 DIAGNOSIS — Z90710 Acquired absence of both cervix and uterus: Secondary | ICD-10-CM | POA: Insufficient documentation

## 2020-07-10 DIAGNOSIS — T451X5A Adverse effect of antineoplastic and immunosuppressive drugs, initial encounter: Secondary | ICD-10-CM

## 2020-07-10 DIAGNOSIS — C549 Malignant neoplasm of corpus uteri, unspecified: Secondary | ICD-10-CM

## 2020-07-10 DIAGNOSIS — D61818 Other pancytopenia: Secondary | ICD-10-CM | POA: Insufficient documentation

## 2020-07-10 DIAGNOSIS — G893 Neoplasm related pain (acute) (chronic): Secondary | ICD-10-CM | POA: Insufficient documentation

## 2020-07-10 DIAGNOSIS — Z90722 Acquired absence of ovaries, bilateral: Secondary | ICD-10-CM | POA: Diagnosis not present

## 2020-07-10 DIAGNOSIS — K769 Liver disease, unspecified: Secondary | ICD-10-CM

## 2020-07-10 DIAGNOSIS — G620 Drug-induced polyneuropathy: Secondary | ICD-10-CM | POA: Insufficient documentation

## 2020-07-10 DIAGNOSIS — M898X9 Other specified disorders of bone, unspecified site: Secondary | ICD-10-CM

## 2020-07-10 DIAGNOSIS — Z7189 Other specified counseling: Secondary | ICD-10-CM

## 2020-07-10 DIAGNOSIS — T451X5D Adverse effect of antineoplastic and immunosuppressive drugs, subsequent encounter: Secondary | ICD-10-CM

## 2020-07-10 DIAGNOSIS — Z23 Encounter for immunization: Secondary | ICD-10-CM

## 2020-07-10 LAB — CMP (CANCER CENTER ONLY)
ALT: 20 U/L (ref 0–44)
AST: 14 U/L — ABNORMAL LOW (ref 15–41)
Albumin: 4.2 g/dL (ref 3.5–5.0)
Alkaline Phosphatase: 89 U/L (ref 38–126)
Anion gap: 12 (ref 5–15)
BUN: 16 mg/dL (ref 6–20)
CO2: 26 mmol/L (ref 22–32)
Calcium: 10.5 mg/dL — ABNORMAL HIGH (ref 8.9–10.3)
Chloride: 102 mmol/L (ref 98–111)
Creatinine: 0.87 mg/dL (ref 0.44–1.00)
GFR, Est AFR Am: >60 mL/min (ref >60)
GFR, Estimated: >60 mL/min (ref >60)
Glucose, Bld: 165 mg/dL — ABNORMAL HIGH (ref 70–99)
Potassium: 3.7 mmol/L (ref 3.5–5.1)
Sodium: 140 mmol/L (ref 135–145)
Total Bilirubin: 0.4 mg/dL (ref 0.3–1.2)
Total Protein: 8.2 g/dL — ABNORMAL HIGH (ref 6.5–8.1)

## 2020-07-10 LAB — CBC WITH DIFFERENTIAL (CANCER CENTER ONLY)
Abs Immature Granulocytes: 0.03 10*3/uL (ref 0.00–0.07)
Basophils Absolute: 0 10*3/uL (ref 0.0–0.1)
Basophils Relative: 0 %
Eosinophils Absolute: 0 10*3/uL (ref 0.0–0.5)
Eosinophils Relative: 0 %
HCT: 34 % — ABNORMAL LOW (ref 36.0–46.0)
Hemoglobin: 11 g/dL — ABNORMAL LOW (ref 12.0–15.0)
Immature Granulocytes: 0 %
Lymphocytes Relative: 17 %
Lymphs Abs: 1.2 10*3/uL (ref 0.7–4.0)
MCH: 26.3 pg (ref 26.0–34.0)
MCHC: 32.4 g/dL (ref 30.0–36.0)
MCV: 81.1 fL (ref 80.0–100.0)
Monocytes Absolute: 0.2 10*3/uL (ref 0.1–1.0)
Monocytes Relative: 3 %
Neutro Abs: 5.6 10*3/uL (ref 1.7–7.7)
Neutrophils Relative %: 80 %
Platelet Count: 61 10*3/uL — ABNORMAL LOW (ref 150–400)
RBC: 4.19 MIL/uL (ref 3.87–5.11)
RDW: 15.9 % — ABNORMAL HIGH (ref 11.5–15.5)
WBC Count: 7 10*3/uL (ref 4.0–10.5)
nRBC: 0 % (ref 0.0–0.2)

## 2020-07-10 MED ORDER — PALONOSETRON HCL INJECTION 0.25 MG/5ML
INTRAVENOUS | Status: AC
  Filled 2020-07-10: qty 5

## 2020-07-10 MED ORDER — HEPARIN SOD (PORK) LOCK FLUSH 100 UNIT/ML IV SOLN
500.0000 [IU] | Freq: Once | INTRAVENOUS | Status: AC | PRN
  Administered 2020-07-10: 500 [IU]
  Filled 2020-07-10: qty 5

## 2020-07-10 MED ORDER — SODIUM CHLORIDE 0.9 % IV SOLN
150.0000 mg | Freq: Once | INTRAVENOUS | Status: AC
  Administered 2020-07-10: 150 mg via INTRAVENOUS
  Filled 2020-07-10: qty 150

## 2020-07-10 MED ORDER — SODIUM CHLORIDE 0.9 % IV SOLN
Freq: Once | INTRAVENOUS | Status: AC
  Filled 2020-07-10: qty 250

## 2020-07-10 MED ORDER — HYDROMORPHONE HCL 4 MG PO TABS
4.0000 mg | ORAL_TABLET | Freq: Four times a day (QID) | ORAL | 0 refills | Status: DC | PRN
Start: 2020-07-10 — End: 2020-10-23

## 2020-07-10 MED ORDER — FAMOTIDINE IN NACL 20-0.9 MG/50ML-% IV SOLN
INTRAVENOUS | Status: AC
  Filled 2020-07-10: qty 50

## 2020-07-10 MED ORDER — FAMOTIDINE IN NACL 20-0.9 MG/50ML-% IV SOLN
20.0000 mg | Freq: Once | INTRAVENOUS | Status: AC
  Administered 2020-07-10: 20 mg via INTRAVENOUS

## 2020-07-10 MED ORDER — SODIUM CHLORIDE 0.9 % IV SOLN
131.2500 mg/m2 | Freq: Once | INTRAVENOUS | Status: AC
  Administered 2020-07-10: 264 mg via INTRAVENOUS
  Filled 2020-07-10: qty 44

## 2020-07-10 MED ORDER — SODIUM CHLORIDE 0.9 % IV SOLN
626.0000 mg | Freq: Once | INTRAVENOUS | Status: AC
  Administered 2020-07-10: 630 mg via INTRAVENOUS
  Filled 2020-07-10: qty 63

## 2020-07-10 MED ORDER — PALONOSETRON HCL INJECTION 0.25 MG/5ML
0.2500 mg | Freq: Once | INTRAVENOUS | Status: AC
  Administered 2020-07-10: 0.25 mg via INTRAVENOUS

## 2020-07-10 MED ORDER — SODIUM CHLORIDE 0.9% FLUSH
10.0000 mL | Freq: Once | INTRAVENOUS | Status: AC
  Administered 2020-07-10: 10 mL
  Filled 2020-07-10: qty 10

## 2020-07-10 MED ORDER — SODIUM CHLORIDE 0.9% FLUSH
10.0000 mL | INTRAVENOUS | Status: DC | PRN
  Administered 2020-07-10: 10 mL
  Filled 2020-07-10: qty 10

## 2020-07-10 MED ORDER — SODIUM CHLORIDE 0.9 % IV SOLN
10.0000 mg | Freq: Once | INTRAVENOUS | Status: AC
  Administered 2020-07-10: 10 mg via INTRAVENOUS
  Filled 2020-07-10: qty 10

## 2020-07-10 MED ORDER — DIPHENHYDRAMINE HCL 50 MG/ML IJ SOLN
INTRAMUSCULAR | Status: AC
  Filled 2020-07-10: qty 1

## 2020-07-10 MED ORDER — DIPHENHYDRAMINE HCL 50 MG/ML IJ SOLN
50.0000 mg | Freq: Once | INTRAMUSCULAR | Status: AC
  Administered 2020-07-10: 50 mg via INTRAVENOUS

## 2020-07-10 MED FILL — HYDROmorphone HCL 4 MG TABS: 4 | 7 days supply | Qty: 30 | Fill #0

## 2020-07-10 NOTE — Progress Notes (Signed)
   Covid-19 Vaccination Clinic  Name:  Njeri Vicente    MRN: [id number] DOB: 1961-05-08  07/10/2020  Ms. Dolliver was observed post Covid-19 immunization for 15 minutes without incident. She was provided with Vaccine Information Sheet and instruction to access the V-Safe system.   Ms. Trampe was instructed to call 911 with any severe reactions post vaccine: Marland Kitchen Difficulty breathing  . Swelling of face and throat  . A fast heartbeat  . A bad rash all over body  . Dizziness and weakness   Immunizations Administered    Name Date Dose VIS Date Route   Pfizer COVID-19 Vaccine 07/10/2020  3:39 PM 0.3 mL 01/15/2019 Intramuscular   Manufacturer: [employer], [employer]   Lot: [id number]   [place]: [id number]

## 2020-07-10 NOTE — Assessment & Plan Note (Signed)
Her original baseline CT imaging show multiple liver lesions Subsequent MRI showed probable benign etiology but given her aggressive nature of disease, I recommend repeating another CT imaging in 2 weeks for further follow-up to make sure that is not worse She is in agreement

## 2020-07-10 NOTE — Assessment & Plan Note (Signed)
Her bone pain has resolved but we discussed the importance of taking pain medicine if needed

## 2020-07-10 NOTE — Progress Notes (Signed)
Platelets today are 53, ok to treat per Dr. Alvy Bimler.

## 2020-07-10 NOTE — Assessment & Plan Note (Addendum)
Despite severe pancytopenia, she is not symptomatic We discussed the risk and benefits of pursuing treatment without delay and she is in agreement, provided that we proceed with dose adjustment as before Her low platelet count is likely due to her chronic ITP She is not symptomatic Observe for now I recommend she proceed with Covid vaccination and she will think about it

## 2020-07-10 NOTE — Patient Instructions (Signed)

## 2020-07-10 NOTE — Progress Notes (Signed)
[facility] OFFICE PROGRESS NOTE  Patient Care Team: [name], MD as PCP - General (Family Medicine) Buford Dresser, MD as PCP - Cardiology (Cardiology) Awanda Mink Craige Cotta, RN as Oncology Nurse Navigator (Oncology)  ASSESSMENT & PLAN:  Adenosarcoma of body of uterus [facility]) Her original baseline CT imaging show multiple liver lesions Subsequent MRI showed probable benign etiology but given her aggressive nature of disease, I recommend repeating another CT imaging in 2 weeks for further follow-up to make sure that is not worse She is in agreement  Pancytopenia, acquired [facility]) Despite severe pancytopenia, she is not symptomatic We discussed the risk and benefits of pursuing treatment without delay and she is in agreement, provided that we proceed with dose adjustment as before Her low platelet count is likely due to her chronic ITP She is not symptomatic Observe for now I recommend she proceed with Covid vaccination and she will think about it  Bone pain Her bone pain has resolved but we discussed the importance of taking pain medicine if needed  Peripheral neuropathy due to chemotherapy [facility]) she has mild peripheral neuropathy, likely related to side effects of treatment. I plan to reduce the dose of treatment as outlined above.  I explained to the patient the rationale of this strategy and reassured the patient it would not compromise the efficacy of treatment    Orders Placed This Encounter  Procedures  . CT ABDOMEN PELVIS W CONTRAST    Standing Status:   Future    Standing Expiration Date:   07/10/2021    Order Specific Question:   If indicated for the ordered procedure, I authorize the administration of contrast media per Radiology protocol    Answer:   Yes    Order Specific Question:   Preferred imaging location?    Answer:   [facility]    Order Specific Question:   Radiology Contrast Protocol - do NOT remove file path    Answer:    [email or web address]    Order Specific Question:   Is patient pregnant?    Answer:   No    All questions were answered. The patient knows to call the clinic with any problems, questions or concerns. The total time spent in the appointment was 25 minutes encounter with patients including review of chart and various tests results, discussions about plan of care and coordination of care plan   Heath Lark, MD 07/10/2020 10:20 AM  INTERVAL HISTORY: Please see below for problem oriented charting. She returns for further follow-up She is doing well Her symptoms are much better with recent treatment She had minimal bruising Her peripheral neuropathy is stable, only grade 1-2 Denies nausea or constipation She has not received vaccination against COVID-19  SUMMARY OF ONCOLOGIC HISTORY: Oncology History Overview Note  MSI stable   Adenosarcoma of body of uterus ([place])  03/03/2020 Initial Diagnosis   She developed PMB in early April after going through menopause at approximately age 59. She saw her PCP on 4/13 for bleeding and an ultrasound was obtained showing a thickened endometrial lining and 3-4cm mass within the endocervical canal. She was referred to [facility], started on Megace and ultimately underwent a TRH/BSO on 5/18 for abnormal uterine bleeding with resulting anemia, large prolapsing fibroid.    03/06/2020 Imaging   US pelvis Markedly thickened heterogeneous, and hypervascular endometrial complex up to 38 mm thick highly concerning for endometrial neoplasm.   Additional more focal 3.0 x 2.8 x 5.3 cm diameter mass within endocervical  canal, may represent extension of above endometrial pathology versus separate endocervical tumor.   Tissue diagnosis recommended.   Nonvisualization of ovaries.   04/07/2020 Pathology Results   A. UTERUS, CERVIX AND BILATERAL FALLOPIAN TUBES AND OVARIES, HYSTERECTOMY AND BILATERAL SALPINGO-OOPHORECTOMY: - Mullerian adenosarcoma of  endometrium. - Tumor limited to the uterus. - Tumor invades to less than half of the myometrium. - No involvement of adnexa. - Margins of resection are not involved. - See oncology table and comment. ONCOLOGY TABLE: UTERUS, SARCOMA: Procedure: Total hysterectomy and bilateral salpingo-oophorectomy Specimen Integrity: The uterus, cervix and bilateral fallopian tubes and ovaries were received intact. The nodule clinically identified as vaginally prolapsed uterine fibroid was received separate and disrupted. Tumor Size: Greatest dimension: 7.5 cm Histologic Type: Mullerian adenosarcoma with overgrowth of the epithelial and stromal components Histologic Grade: The epithelial component has proliferated to form endometrioid adenocarcinoma which ranges from low grade to high grade. The stromal component focally shows sarcomatous overgrowth. Myometrial Invasion: Present Depth of invasion: 4 mm Myometrial thickness: 30 mm Other Tissue/Organ Involvement: Not identified Margins: Uninvolved by sarcoma Lymphovascular Invasion: Not identified Regional Lymph Nodes: No lymph nodes submitted or found Pathologic Stage Classification (pTNM, AJCC 8th Edition): pT1b, pNX Additional Pathologic Findings: Adenomyosis. Leiomyomata.   04/07/2020 Surgery   PRE-OPERATIVE DIAGNOSIS:  Large prolapsed fibroid vaginally, menometrorrhagia with anemia   POST-OPERATIVE DIAGNOSIS:  Large prolapsed fibroid vaginally, menometrorrhagia with anemia.  Omental adhesions with anterior abdominal wall.   PROCEDURE:  Procedure(s): XI ROBOTIC TOTAL LAPAROSCOPIC HYSTERECTOMY, BILATERAL SALPINGO OOPHORECTOMY/LYSIS OF ADHESIONS/VAGINAL MYOMECTOMY    FINDINGS: Large necrotic vaginally prolapsed uterine myoma.  Uterus with small subserosal/intramural myoma.  Bilateral tubes post tubal ligation.  Bilateral normal ovaries.  Omental adhesions with anterior abdominal wall.   05/01/2020 Imaging   Ct scan of chest, abdomen and pelvis 1.  Multiple ill-defined low density liver masses, suspicious for metastases. These could be further characterized with pre and postcontrast magnetic resonance imaging of the liver. 2. Moderate diffuse vaginal wall thickening, possibly representing edema associated with the patient's recent surgery. 3. Small hiatal hernia. 4. Minimal coronary artery atheromatous calcifications. 5. Cholelithiasis. 6. Tiny rounded area of low density in the posterior spleen, too small to characterize.   05/11/2020 Cancer Staging   Staging form: Corpus Uteri - Adenosarcoma, AJCC 8th Edition - Pathologic: Stage IB (pT1b, pN0, cM0) - Signed by Heath Lark, MD on 05/15/2020   05/14/2020 Imaging   MRI liver 1. The 2 dominant right hepatic lobe lesions have MR imaging features most consistent with benign cavernous hemangioma. The remaining tiny liver lesions are too small to characterize. Statistically, while these are likely benign, follow-up MRI in 3 months recommended to ensure stability. 2. No other findings to suggest metastatic disease.   05/22/2020 Procedure   Placement of a subcutaneous port device. Catheter tip at the superior cavoatrial junction   05/29/2020 -  Chemotherapy   The patient had carboplatin and taxol for chemotherapy treatment.       REVIEW OF SYSTEMS:   Constitutional: Denies fevers, chills or abnormal weight loss Eyes: Denies blurriness of vision Ears, nose, mouth, throat, and face: Denies mucositis or sore throat Respiratory: Denies cough, dyspnea or wheezes Cardiovascular: Denies palpitation, chest discomfort or lower extremity swelling Gastrointestinal:  Denies nausea, heartburn or change in bowel habits Skin: Denies abnormal skin rashes Behavioral/Psych: Mood is stable, no new changes  All other systems were reviewed with the patient and are negative.  I have reviewed the past medical history, past surgical history, social history  and family history with the patient and they are  unchanged from previous note.  ALLERGIES:  is allergic to latex and sulfur.  MEDICATIONS:  Current Outpatient Medications  Medication Sig Dispense Refill  . cyclobenzaprine (FLEXERIL) 10 MG tablet Take 1 tablet (10 mg total) by mouth at bedtime as needed for muscle spasms. 15 tablet 0  . dexamethasone (DECADRON) 4 MG tablet Take 2 tabs at the night before and 2 tab the morning of chemotherapy, every 3 weeks, by mouth x 6 cycles 36 tablet 6  . famotidine (PEPCID) 20 MG tablet TAKE 1 TABLET BY MOUTH TWICE DAILY FOR HEART 180 tablet 1  . hydrochlorothiazide (MICROZIDE) 12.5 MG capsule Take 1 capsule by mouth once daily 90 capsule 1  . HYDROmorphone (DILAUDID) 4 MG tablet Take 1 tablet (4 mg total) by mouth every 6 (six) hours as needed for severe pain. 30 tablet 0  . lidocaine-prilocaine (EMLA) cream Apply to affected area once 30 g 3  . losartan (COZAAR) 50 MG tablet Take 1 tablet by mouth once daily 90 tablet 1  . ondansetron (ZOFRAN) 8 MG tablet Take 1 tablet (8 mg total) by mouth every 8 (eight) hours as needed. 30 tablet 1  . prochlorperazine (COMPAZINE) 10 MG tablet Take 1 tablet (10 mg total) by mouth every 6 (six) hours as needed (Nausea or vomiting). 30 tablet 1   No current facility-administered medications for this visit.   Facility-Administered Medications Ordered in Other Visits  Medication Dose Route Frequency Provider Last Rate Last Admin  . CARBOplatin (PARAPLATIN) 630 mg in sodium chloride 0.9 % 250 mL chemo infusion  630 mg Intravenous Once Alvy Bimler, Colin Ellers, MD      . famotidine (PEPCID) IVPB 20 mg premix  20 mg Intravenous Once Alvy Bimler, Ariannie Penaloza, MD      . fosaprepitant (EMEND) 150 mg in sodium chloride 0.9 % 145 mL IVPB  150 mg Intravenous Once Alvy Bimler, Faiga Stones, MD 450 mL/hr at 07/10/20 1008 150 mg at 07/10/20 1008  . heparin lock flush 100 unit/mL  500 Units Intracatheter Once PRN Alvy Bimler, Marsella Suman, MD      . PACLitaxel (TAXOL) 264 mg in sodium chloride 0.9 % 250 mL chemo infusion (> 67m/m2)   131.25 mg/m2 (Treatment Plan Recorded) Intravenous Once Briellah Baik, MD      . sodium chloride flush (NS) 0.9 % injection 10 mL  10 mL Intracatheter PRN GAlvy Bimler Mekel Haverstock, MD        PHYSICAL EXAMINATION: ECOG PERFORMANCE STATUS: 1 - Symptomatic but completely ambulatory  Vitals:   07/10/20 0851  BP: 133/61  Pulse: 81  Resp: 18  Temp: 98.4 F (36.9 C)  SpO2: 100%   Filed Weights   07/10/20 0851  Weight: 204 lb 6.4 oz (92.7 kg)    GENERAL:alert, no distress and comfortable SKIN: skin color, texture, turgor are normal, no rashes or significant lesions EYES: normal, Conjunctiva are pink and non-injected, sclera clear OROPHARYNX:no exudate, no erythema and lips, buccal mucosa, and tongue normal  NECK: supple, thyroid normal size, non-tender, without nodularity LYMPH:  no palpable lymphadenopathy in the cervical, axillary or inguinal LUNGS: clear to auscultation and percussion with normal breathing effort HEART: regular rate & rhythm and no murmurs and no lower extremity edema ABDOMEN:abdomen soft, non-tender and normal bowel sounds Musculoskeletal:no cyanosis of digits and no clubbing  NEURO: alert & oriented x 3 with fluent speech, no focal motor/sensory deficits  LABORATORY DATA:  I have reviewed the data as listed    Component Value Date/Time  NA 140 07/10/2020 0822   NA 142 04/07/2017 0810   K 3.7 07/10/2020 0822   K 3.8 04/07/2017 0810   CL 102 07/10/2020 0822   CO2 26 07/10/2020 0822   CO2 28 04/07/2017 0810   GLUCOSE 165 (H) 07/10/2020 0822   GLUCOSE 100 04/07/2017 0810   BUN 16 07/10/2020 0822   BUN 16.8 04/07/2017 0810   CREATININE 0.87 07/10/2020 0822   CREATININE 0.9 04/07/2017 0810   CALCIUM 10.5 (H) 07/10/2020 0822   CALCIUM 9.9 04/07/2017 0810   PROT 8.2 (H) 07/10/2020 0822   PROT 8.0 04/07/2017 0810   ALBUMIN 4.2 07/10/2020 0822   ALBUMIN 4.4 04/07/2017 0810   AST 14 (L) 07/10/2020 0822   AST 18 04/07/2017 0810   ALT 20 07/10/2020 0822   ALT 23  04/07/2017 0810   ALKPHOS 89 07/10/2020 0822   ALKPHOS 79 04/07/2017 0810   BILITOT 0.4 07/10/2020 0822   BILITOT 0.70 04/07/2017 0810   GFRNONAA >60 07/10/2020 0822   GFRAA >60 07/10/2020 0822    No results found for: SPEP, UPEP  Lab Results  Component Value Date   WBC 7.0 07/10/2020   NEUTROABS 5.6 07/10/2020   HGB 11.0 (L) 07/10/2020   HCT 34.0 (L) 07/10/2020   MCV 81.1 07/10/2020   PLT 61 (L) 07/10/2020      Chemistry      Component Value Date/Time   NA 140 07/10/2020 0822   NA 142 04/07/2017 0810   K 3.7 07/10/2020 0822   K 3.8 04/07/2017 0810   CL 102 07/10/2020 0822   CO2 26 07/10/2020 0822   CO2 28 04/07/2017 0810   BUN 16 07/10/2020 0822   BUN 16.8 04/07/2017 0810   CREATININE 0.87 07/10/2020 0822   CREATININE 0.9 04/07/2017 0810      Component Value Date/Time   CALCIUM 10.5 (H) 07/10/2020 0822   CALCIUM 9.9 04/07/2017 0810   ALKPHOS 89 07/10/2020 0822   ALKPHOS 79 04/07/2017 0810   AST 14 (L) 07/10/2020 0822   AST 18 04/07/2017 0810   ALT 20 07/10/2020 0822   ALT 23 04/07/2017 0810   BILITOT 0.4 07/10/2020 0822   BILITOT 0.70 04/07/2017 0810

## 2020-07-10 NOTE — Patient Instructions (Signed)
[facility] °Discharge Instructions for Patients Receiving Chemotherapy ° °Today you received the following chemotherapy agents Taxol; Carboplatin ° °To help prevent nausea and vomiting after your treatment, we encourage you to take your nausea medication as directed °  °If you develop nausea and vomiting that is not controlled by your nausea medication, call the clinic.  ° °BELOW ARE SYMPTOMS THAT SHOULD BE REPORTED IMMEDIATELY: °· *FEVER GREATER THAN 100.5 F °· *CHILLS WITH OR WITHOUT FEVER °· NAUSEA AND VOMITING THAT IS NOT CONTROLLED WITH YOUR NAUSEA MEDICATION °· *UNUSUAL SHORTNESS OF BREATH °· *UNUSUAL BRUISING OR BLEEDING °· TENDERNESS IN MOUTH AND THROAT WITH OR WITHOUT PRESENCE OF ULCERS °· *URINARY PROBLEMS °· *BOWEL PROBLEMS °· UNUSUAL RASH °Items with * indicate a potential emergency and should be followed up as soon as possible. ° °Feel free to call the clinic should you have any questions or concerns. The clinic phone number is [phone]. ° °Please show the CHEMO ALERT CARD at check-in to the Emergency Department and triage nurse. ° ° °

## 2020-07-10 NOTE — Assessment & Plan Note (Signed)
she has mild peripheral neuropathy, likely related to side effects of treatment. °I plan to reduce the dose of treatment as outlined above.  °I explained to the patient the rationale of this strategy and reassured the patient it would not compromise the efficacy of treatment ° °

## 2020-07-10 NOTE — Telephone Encounter (Signed)
Scheduled appts per 8/20 sch msg. Pt declined print out of AVS and stated she would refer to mychart.

## 2020-07-22 ENCOUNTER — Other Ambulatory Visit: Payer: Self-pay

## 2020-07-22 ENCOUNTER — Ambulatory Visit (HOSPITAL_COMMUNITY)
Admission: RE | Admit: 2020-07-22 | Discharge: 2020-07-22 | Disposition: A | Discharge status: Home / Self Care | Payer: PRIVATE HEALTH INSURANCE | Source: Ambulatory Visit | Attending: Hematology and Oncology | Admitting: Hematology and Oncology

## 2020-07-22 DIAGNOSIS — C549 Malignant neoplasm of corpus uteri, unspecified: Secondary | ICD-10-CM

## 2020-07-22 DIAGNOSIS — C55 Malignant neoplasm of uterus, part unspecified: Secondary | ICD-10-CM | POA: Insufficient documentation

## 2020-07-22 MED ORDER — IOHEXOL 300 MG/ML  SOLN
100.0000 mL | Freq: Once | INTRAMUSCULAR | Status: AC | PRN
  Administered 2020-07-22: 100 mL via INTRAVENOUS

## 2020-07-22 MED ORDER — IOHEXOL 9 MG/ML PO SOLN: 500.0000 mL | ORAL | Status: DC

## 2020-07-23 ENCOUNTER — Encounter: Payer: Self-pay | Admitting: Hematology and Oncology

## 2020-07-23 ENCOUNTER — Other Ambulatory Visit: Payer: Self-pay

## 2020-07-23 ENCOUNTER — Inpatient Hospital Stay (HOSPITAL_BASED_OUTPATIENT_CLINIC_OR_DEPARTMENT_OTHER): Payer: PRIVATE HEALTH INSURANCE | Admitting: Hematology and Oncology

## 2020-07-23 ENCOUNTER — Ambulatory Visit: Payer: PRIVATE HEALTH INSURANCE | Attending: Hematology and Oncology | Admitting: Physical Therapy

## 2020-07-23 DIAGNOSIS — R29898 Other symptoms and signs involving the musculoskeletal system: Secondary | ICD-10-CM

## 2020-07-23 DIAGNOSIS — C55 Malignant neoplasm of uterus, part unspecified: Secondary | ICD-10-CM

## 2020-07-23 DIAGNOSIS — Z79818 Long term (current) use of other agents affecting estrogen receptors and estrogen levels: Secondary | ICD-10-CM | POA: Insufficient documentation

## 2020-07-23 DIAGNOSIS — K5909 Other constipation: Secondary | ICD-10-CM

## 2020-07-23 DIAGNOSIS — Z299 Encounter for prophylactic measures, unspecified: Secondary | ICD-10-CM

## 2020-07-23 DIAGNOSIS — R110 Nausea: Secondary | ICD-10-CM | POA: Insufficient documentation

## 2020-07-23 DIAGNOSIS — G620 Drug-induced polyneuropathy: Secondary | ICD-10-CM | POA: Insufficient documentation

## 2020-07-23 DIAGNOSIS — G893 Neoplasm related pain (acute) (chronic): Secondary | ICD-10-CM | POA: Insufficient documentation

## 2020-07-23 DIAGNOSIS — R209 Unspecified disturbances of skin sensation: Secondary | ICD-10-CM | POA: Diagnosis present

## 2020-07-23 DIAGNOSIS — Z90710 Acquired absence of both cervix and uterus: Secondary | ICD-10-CM | POA: Insufficient documentation

## 2020-07-23 DIAGNOSIS — Z90722 Acquired absence of ovaries, bilateral: Secondary | ICD-10-CM | POA: Insufficient documentation

## 2020-07-23 DIAGNOSIS — C549 Malignant neoplasm of corpus uteri, unspecified: Secondary | ICD-10-CM

## 2020-07-23 DIAGNOSIS — M898X9 Other specified disorders of bone, unspecified site: Secondary | ICD-10-CM

## 2020-07-23 DIAGNOSIS — T451X5A Adverse effect of antineoplastic and immunosuppressive drugs, initial encounter: Secondary | ICD-10-CM

## 2020-07-23 DIAGNOSIS — T451X5D Adverse effect of antineoplastic and immunosuppressive drugs, subsequent encounter: Secondary | ICD-10-CM

## 2020-07-23 DIAGNOSIS — R42 Dizziness and giddiness: Secondary | ICD-10-CM | POA: Insufficient documentation

## 2020-07-23 DIAGNOSIS — Z483 Aftercare following surgery for neoplasm: Secondary | ICD-10-CM | POA: Diagnosis present

## 2020-07-23 DIAGNOSIS — I10 Essential (primary) hypertension: Secondary | ICD-10-CM | POA: Insufficient documentation

## 2020-07-23 NOTE — Assessment & Plan Note (Signed)
She will continue to take pain medicine as needed °

## 2020-07-23 NOTE — Assessment & Plan Note (Signed)
We discussed aggressive laxatives

## 2020-07-23 NOTE — Progress Notes (Signed)
[facility] PROGRESS NOTE  Patient Care Team: [name], MD as PCP - General (Family Medicine) Buford Dresser, MD as PCP - Cardiology (Cardiology) Awanda Mink Craige Cotta, RN as Oncology Nurse Navigator (Oncology)  ASSESSMENT & PLAN:  Adenosarcoma of body of uterus [facility]) I have reviewed multiple imaging studies with the patient Overall, I see stability of abnormal findings from previous CT imaging The soft tissue thickening near the vagina area could represent postop changes She is not symptomatic Continue to observe for now  Peripheral neuropathy due to chemotherapy [facility]) She is experiencing significant grade 2 to grade 3 neuropathy Plan drastic dose adjustment of Taxol in her next visit  Bone pain She will continue to take pain medicine as needed  Chemotherapy-induced nausea The cause of her recent nausea could be related to constipation and side effects of treatment We discussed antiemetics use and aggressive laxative management   Other constipation We discussed aggressive laxatives  Preventive measure She has received Covid vaccination We discussed the importance of social distancing We discussed the risk and benefits of her returning to work part-time during treatment   No orders of the defined types were placed in this encounter.   All questions were answered. The patient knows to call the clinic with any problems, questions or concerns. The total time spent in the appointment was 30 minutes encounter with patients including review of chart and various tests results, discussions about plan of care and coordination of care plan   Heath Lark, MD 07/23/2020 3:22 PM  INTERVAL HISTORY: Please see below for problem oriented charting. She returns for further follow-up and review of imaging study Since last time I saw her, she had nausea within day 3 of treatment She is also somewhat constipated She have severe neuropathic pain affecting her knees  and her shin and her legs She have some mild dizziness She is asking about whether she could return back to work She has noted some new changes and wanted to know if she can go and get her nails done  SUMMARY OF ONCOLOGIC HISTORY: Oncology History Overview Note  MSI stable   Adenosarcoma of body of uterus ([place])  03/03/2020 Initial Diagnosis   She developed PMB in early April after going through menopause at approximately age 59. She saw her PCP on 4/13 for bleeding and an ultrasound was obtained showing a thickened endometrial lining and 3-4cm mass within the endocervical canal. She was referred to [facility], started on Megace and ultimately underwent a TRH/BSO on 5/18 for abnormal uterine bleeding with resulting anemia, large prolapsing fibroid.    03/06/2020 Imaging   US pelvis Markedly thickened heterogeneous, and hypervascular endometrial complex up to 38 mm thick highly concerning for endometrial neoplasm.   Additional more focal 3.0 x 2.8 x 5.3 cm diameter mass within endocervical canal, may represent extension of above endometrial pathology versus separate endocervical tumor.   Tissue diagnosis recommended.   Nonvisualization of ovaries.   04/07/2020 Pathology Results   A. UTERUS, CERVIX AND BILATERAL FALLOPIAN TUBES AND OVARIES, HYSTERECTOMY AND BILATERAL SALPINGO-OOPHORECTOMY: - Mullerian adenosarcoma of endometrium. - Tumor limited to the uterus. - Tumor invades to less than half of the myometrium. - No involvement of adnexa. - Margins of resection are not involved. - See oncology table and comment. ONCOLOGY TABLE: UTERUS, SARCOMA: Procedure: Total hysterectomy and bilateral salpingo-oophorectomy Specimen Integrity: The uterus, cervix and bilateral fallopian tubes and ovaries were received intact. The nodule clinically identified as vaginally prolapsed uterine fibroid was received  separate and disrupted. Tumor Size: Greatest dimension: 7.5 cm Histologic Type: Mullerian  adenosarcoma with overgrowth of the epithelial and stromal components Histologic Grade: The epithelial component has proliferated to form endometrioid adenocarcinoma which ranges from low grade to high grade. The stromal component focally shows sarcomatous overgrowth. Myometrial Invasion: Present Depth of invasion: 4 mm Myometrial thickness: 30 mm Other Tissue/Organ Involvement: Not identified Margins: Uninvolved by sarcoma Lymphovascular Invasion: Not identified Regional Lymph Nodes: No lymph nodes submitted or found Pathologic Stage Classification (pTNM, AJCC 8th Edition): pT1b, pNX Additional Pathologic Findings: Adenomyosis. Leiomyomata.   04/07/2020 Surgery   PRE-OPERATIVE DIAGNOSIS:  Large prolapsed fibroid vaginally, menometrorrhagia with anemia   POST-OPERATIVE DIAGNOSIS:  Large prolapsed fibroid vaginally, menometrorrhagia with anemia.  Omental adhesions with anterior abdominal wall.   PROCEDURE:  Procedure(s): XI ROBOTIC TOTAL LAPAROSCOPIC HYSTERECTOMY, BILATERAL SALPINGO OOPHORECTOMY/LYSIS OF ADHESIONS/VAGINAL MYOMECTOMY    FINDINGS: Large necrotic vaginally prolapsed uterine myoma.  Uterus with small subserosal/intramural myoma.  Bilateral tubes post tubal ligation.  Bilateral normal ovaries.  Omental adhesions with anterior abdominal wall.   05/01/2020 Imaging   Ct scan of chest, abdomen and pelvis 1. Multiple ill-defined low density liver masses, suspicious for metastases. These could be further characterized with pre and postcontrast magnetic resonance imaging of the liver. 2. Moderate diffuse vaginal wall thickening, possibly representing edema associated with the patient's recent surgery. 3. Small hiatal hernia. 4. Minimal coronary artery atheromatous calcifications. 5. Cholelithiasis. 6. Tiny rounded area of low density in the posterior spleen, too small to characterize.   05/11/2020 Cancer Staging   Staging form: Corpus Uteri - Adenosarcoma, AJCC 8th Edition -  Pathologic: Stage IB (pT1b, pN0, cM0) - Signed by Heath Lark, MD on 05/15/2020   05/14/2020 Imaging   MRI liver 1. The 2 dominant right hepatic lobe lesions have MR imaging features most consistent with benign cavernous hemangioma. The remaining tiny liver lesions are too small to characterize. Statistically, while these are likely benign, follow-up MRI in 3 months recommended to ensure stability. 2. No other findings to suggest metastatic disease.   05/22/2020 Procedure   Placement of a subcutaneous port device. Catheter tip at the superior cavoatrial junction   05/29/2020 -  Chemotherapy   The patient had carboplatin and taxol for chemotherapy treatment.     07/22/2020 Imaging   1. Status post hysterectomy and bilateral oophorectomy. Irregular soft tissue density in the region of the vaginal cuff is relatively similar to on the prior exam. Although this could represent developing scar or chronic hematoma, residual disease cannot be excluded. This could either be re-evaluated at follow-up or if a more aggressive approach is desired, characterized with PET. 2. No other evidence of metastatic disease in the abdomen or pelvis. 3. Hepatic hemangiomas, as before. Smaller liver lesions are unchanged, favoring a benign etiology. 4. Cholelithiasis. 5. Tiny hiatal hernia. 6. Aortic Atherosclerosis (ICD10-I70.0).     REVIEW OF SYSTEMS:   Constitutional: Denies fevers, chills or abnormal weight loss Eyes: Denies blurriness of vision Ears, nose, mouth, throat, and face: Denies mucositis or sore throat Respiratory: Denies cough, dyspnea or wheezes Cardiovascular: Denies palpitation, chest discomfort or lower extremity swelling Skin: Denies abnormal skin rashes Lymphatics: Denies new lymphadenopathy or easy bruising Behavioral/Psych: Mood is stable, no new changes  All other systems were reviewed with the patient and are negative.  I have reviewed the past medical history, past surgical history,  social history and family history with the patient and they are unchanged from previous note.  ALLERGIES:  is allergic  to latex and sulfur.  MEDICATIONS:  Current Outpatient Medications  Medication Sig Dispense Refill  . cyclobenzaprine (FLEXERIL) 10 MG tablet Take 1 tablet (10 mg total) by mouth at bedtime as needed for muscle spasms. 15 tablet 0  . dexamethasone (DECADRON) 4 MG tablet Take 2 tabs at the night before and 2 tab the morning of chemotherapy, every 3 weeks, by mouth x 6 cycles 36 tablet 6  . famotidine (PEPCID) 20 MG tablet TAKE 1 TABLET BY MOUTH TWICE DAILY FOR HEART 180 tablet 1  . hydrochlorothiazide (MICROZIDE) 12.5 MG capsule Take 1 capsule by mouth once daily 90 capsule 1  . HYDROmorphone (DILAUDID) 4 MG tablet Take 1 tablet (4 mg total) by mouth every 6 (six) hours as needed for severe pain. 30 tablet 0  . lidocaine-prilocaine (EMLA) cream Apply to affected area once 30 g 3  . losartan (COZAAR) 50 MG tablet Take 1 tablet by mouth once daily 90 tablet 1  . ondansetron (ZOFRAN) 8 MG tablet Take 1 tablet (8 mg total) by mouth every 8 (eight) hours as needed. 30 tablet 1  . prochlorperazine (COMPAZINE) 10 MG tablet Take 1 tablet (10 mg total) by mouth every 6 (six) hours as needed (Nausea or vomiting). 30 tablet 1   No current facility-administered medications for this visit.    PHYSICAL EXAMINATION: ECOG PERFORMANCE STATUS: 1 - Symptomatic but completely ambulatory  Vitals:   07/23/20 1422  BP: (!) 145/81  Pulse: 67  Temp: (!) 97.2 F (36.2 C)  SpO2: 100%   Filed Weights   07/23/20 1422  Weight: 204 lb (92.5 kg)    GENERAL:alert, no distress and comfortable NEURO: alert & oriented x 3 with fluent speech, no focal motor/sensory deficits  LABORATORY DATA:  I have reviewed the data as listed    Component Value Date/Time   NA 140 07/10/2020 0822   NA 142 04/07/2017 0810   K 3.7 07/10/2020 0822   K 3.8 04/07/2017 0810   CL 102 07/10/2020 0822   CO2 26  07/10/2020 0822   CO2 28 04/07/2017 0810   GLUCOSE 165 (H) 07/10/2020 0822   GLUCOSE 100 04/07/2017 0810   BUN 16 07/10/2020 0822   BUN 16.8 04/07/2017 0810   CREATININE 0.87 07/10/2020 0822   CREATININE 0.9 04/07/2017 0810   CALCIUM 10.5 (H) 07/10/2020 0822   CALCIUM 9.9 04/07/2017 0810   PROT 8.2 (H) 07/10/2020 0822   PROT 8.0 04/07/2017 0810   ALBUMIN 4.2 07/10/2020 0822   ALBUMIN 4.4 04/07/2017 0810   AST 14 (L) 07/10/2020 0822   AST 18 04/07/2017 0810   ALT 20 07/10/2020 0822   ALT 23 04/07/2017 0810   ALKPHOS 89 07/10/2020 0822   ALKPHOS 79 04/07/2017 0810   BILITOT 0.4 07/10/2020 0822   BILITOT 0.70 04/07/2017 0810   GFRNONAA >60 07/10/2020 0822   GFRAA >60 07/10/2020 0822    No results found for: SPEP, UPEP  Lab Results  Component Value Date   WBC 7.0 07/10/2020   NEUTROABS 5.6 07/10/2020   HGB 11.0 (L) 07/10/2020   HCT 34.0 (L) 07/10/2020   MCV 81.1 07/10/2020   PLT 61 (L) 07/10/2020      Chemistry      Component Value Date/Time   NA 140 07/10/2020 0822   NA 142 04/07/2017 0810   K 3.7 07/10/2020 0822   K 3.8 04/07/2017 0810   CL 102 07/10/2020 0822   CO2 26 07/10/2020 0822   CO2 28 04/07/2017 0810  BUN 16 07/10/2020 0822   BUN 16.8 04/07/2017 0810   CREATININE 0.87 07/10/2020 0822   CREATININE 0.9 04/07/2017 0810      Component Value Date/Time   CALCIUM 10.5 (H) 07/10/2020 0822   CALCIUM 9.9 04/07/2017 0810   ALKPHOS 89 07/10/2020 0822   ALKPHOS 79 04/07/2017 0810   AST 14 (L) 07/10/2020 0822   AST 18 04/07/2017 0810   ALT 20 07/10/2020 0822   ALT 23 04/07/2017 0810   BILITOT 0.4 07/10/2020 0822   BILITOT 0.70 04/07/2017 0810       RADIOGRAPHIC STUDIES: I have reviewed multiple CT imaging with the patient I have personally reviewed the radiological images as listed and agreed with the findings in the report. CT ABDOMEN PELVIS W CONTRAST  Result Date: 07/22/2020 CLINICAL DATA:  Uterine/cervical cancer. Evaluate treatment response.  Currently on chemotherapy. Status post hysterectomy with bilateral salpingo oophorectomy 04/07/2020. Vaginal myomectomy. EXAM: CT ABDOMEN AND PELVIS WITH CONTRAST TECHNIQUE: Multidetector CT imaging of the abdomen and pelvis was performed using the standard protocol following bolus administration of intravenous contrast. CONTRAST:  15m OMNIPAQUE IOHEXOL 300 MG/ML  SOLN COMPARISON:  05/01/2020 and the MRI of 05/14/2020. FINDINGS: Lower chest: Clear lung bases. Borderline cardiomegaly, without pericardial or pleural effusion. Central line tip in low right atrium. Tiny hiatal hernia. Hepatobiliary: Right hepatic lobe hypoattenuating lesions x2 are similar, including at up to 1.4 cm on 13/2. These were consistent with hemangiomas on the prior MRI. Left-sided smaller lesions, including at up to 1.2 cm in segment 2 on 15/2 are similar, favoring a benign etiology. 2.3 cm gallstone. No acute cholecystitis or biliary duct dilatation. Pancreas: Normal, without mass or ductal dilatation. Spleen: Normal in size, without focal abnormality. Adrenals/Urinary Tract: Normal adrenal glands. Normal left kidney. Too small to characterize interpolar right renal lesion. Normal urinary bladder. Stomach/Bowel: Normal remainder of the stomach. Transverse duodenal diverticulum. Otherwise normal small bowel. Normal colon, appendix, and terminal ileum. Vascular/Lymphatic: Aortic atherosclerosis. No abdominopelvic adenopathy. Reproductive: Hysterectomy and bilateral oophorectomy. Irregular soft tissue density in the region of the vaginal cuff. Example eccentric right at 2.8 x 2.6 cm on 74/2. On the order of 2.1 x 3.1 cm on the prior exam (when remeasured). More inferiorly and eccentric left, contiguous soft tissue density measures 3.3 cm on 75/2, similar to 3.4 cm on the prior exam. Other: No significant free fluid. No evidence of omental or peritoneal disease. Musculoskeletal: Left sacral sclerotic lesion is likely a bone island.  Degenerative disc disease at L4-5. IMPRESSION: 1. Status post hysterectomy and bilateral oophorectomy. Irregular soft tissue density in the region of the vaginal cuff is relatively similar to on the prior exam. Although this could represent developing scar or chronic hematoma, residual disease cannot be excluded. This could either be re-evaluated at follow-up or if a more aggressive approach is desired, characterized with PET. 2. No other evidence of metastatic disease in the abdomen or pelvis. 3. Hepatic hemangiomas, as before. Smaller liver lesions are unchanged, favoring a benign etiology. 4. Cholelithiasis. 5. Tiny hiatal hernia. 6. Aortic Atherosclerosis (ICD10-I70.0). Electronically Signed   By: KAbigail MiyamotoM.D.   On: 07/22/2020 11:04

## 2020-07-23 NOTE — Assessment & Plan Note (Signed)
She is experiencing significant grade 2 to grade 3 neuropathy Plan drastic dose adjustment of Taxol in her next visit

## 2020-07-23 NOTE — Assessment & Plan Note (Signed)
I have reviewed multiple imaging studies with the patient Overall, I see stability of abnormal findings from previous CT imaging The soft tissue thickening near the vagina area could represent postop changes She is not symptomatic Continue to observe for now

## 2020-07-23 NOTE — Progress Notes (Signed)
Met with patient and daughter at registration to complete grant paperwork.  Patient approved for one-time $1000 [employer] grant to assist with personal expenses while going through treatment. Gave her a copy of the approval letter and expense sheet along with the Outpatient pharmacy information. She received a gas card today from the grant.  She has my card for any additional financial questions or concerns.

## 2020-07-23 NOTE — Assessment & Plan Note (Signed)
She has received Covid vaccination We discussed the importance of social distancing We discussed the risk and benefits of her returning to work part-time during treatment

## 2020-07-23 NOTE — Assessment & Plan Note (Signed)
The cause of her recent nausea could be related to constipation and side effects of treatment We discussed antiemetics use and aggressive laxative management

## 2020-07-23 NOTE — Therapy (Signed)
[place], [place], [address] Phone: [phone]   Fax:  [phone]  Physical Therapy Treatment  Patient Details  Name: Rachel Rivas MRN: [id number] Date of Birth: 1961-06-13 Referring Provider (PT): Dr. Alvy Bimler    Encounter Date: 07/23/2020   PT End of Session - 07/23/20 1652    Visit Number 5    Number of Visits 17    Date for PT Re-Evaluation 08/12/20    PT Start Time 1600    PT Stop Time 1645    PT Time Calculation (min) 45 min    Activity Tolerance Patient tolerated treatment well    Behavior During Therapy [facility] for tasks assessed/performed           Past Medical History:  Diagnosis Date  . Allergy    occasionally  . Anemia    yrs ago, low platelets  . Arthritis    RA  . Blood transfusion without reported diagnosis    1985 with c section  . Borderline diabetic   . Dyspnea    While taking Megace  . GERD (gastroesophageal reflux disease)   . Hypertension   . Obstructive sleep apnea syndrome 01/07/2020  . Thrombocytopenia ([place])     Past Surgical History:  Procedure Laterality Date  . CESAREAN SECTION    . COLONOSCOPY    . IR IMAGING GUIDED PORT INSERTION  05/22/2020  . MYOMECTOMY N/A 04/07/2020   Procedure: VAGINAL MYOMECTOMY;  Surgeon: Princess Bruins, MD;  Location: [facility];  Service: Gynecology;  Laterality: N/A;  . POLYPECTOMY    . ROBOTIC ASSISTED TOTAL HYSTERECTOMY WITH BILATERAL SALPINGO OOPHERECTOMY Bilateral 04/07/2020   Procedure: XI ROBOTIC TOTAL LAPAROSCOPIC HYSTERECTOMY, BILATERAL SALPINGO OOPHORECTOMY;  Surgeon: Princess Bruins, MD;  Location: [place];  Service: Gynecology;  Laterality: Bilateral;  . STERILIZATION    . TUBAL LIGATION      There were no vitals filed for this visit.   Subjective Assessment - 07/23/20 1650    Subjective Pt had a CT scan yesterday and she said that "evertyhing is good"  She said that the  chemo is preventative so the doctor willl adjust the medication so she doesn't have the symptoms in her legs.  She feels like she knows what exercises she should do at home.  When her back is hurting she knows she needs to rest, When she is able she walks to help her feels better at least 3 to 4 times a week about 15 minutes at a time    Pertinent History diagnosis of uterine cancer with laparoscopic hysterectomy 04/07/2020( all cancer was removed) she has started chemotherapy    Patient Stated Goals To get legs stronger to deal better with chemotherapy    Currently in Pain? No/denies              [facility] PT Assessment - 07/23/20 0001      Sit to Stand   Comments 13 reps in 30 seconds.  Pt with some dyspnes                          [facility] Adult PT Treatment/Exercise - 07/23/20 0001      Ambulation/Gait   Ambulation/Gait Yes    Ambulation/Gait Assistance 7: Independent      Lumbar Exercises: Aerobic   Nustep x 5 minutes level  1    pt did well, felt like legs loosen up as she progressed  Lumbar Exercises: Standing   Heel Raises 5 reps    Functional Squats 10 reps    Forward Lunge 10 reps   on each leg in parrallel bars      Knee/Hip Exercises: Standing   Hip Flexion Stengthening;Right;Left;10 reps    Hip Abduction Stengthening;Both    Hip Extension Stengthening;Right;Left;5 reps    Other Standing Knee Exercises unlilateral leg stance                     PT Short Term Goals - 07/23/20 1647      PT SHORT TERM GOAL #1   Title Pt will be indpendent in a HEP for low back and hip streching to help with back pain    Status Achieved             PT Long Term Goals - 07/23/20 1639      PT LONG TERM GOAL #1   Title Pt will be independent in a home exercise program for general strength and endurance so that she will be able to tolerate her chemotherapy    Status Achieved      PT LONG TERM GOAL #2   Title Pt will maintain ablitiy to do  10  repetitions of sit to stand in 30 seconds after 4 rounds of chemotherapy    Baseline 10 reps in 30 sec. on 06/11/2020, 07/23/2020 13 reps in 30 sec    Status Achieved      PT LONG TERM GOAL #3   Title Pt will maintan TUG < 7 seconds after 4 rounds of chemotherapy    Baseline 6.05 on 06/11/2020, 5.57 on 9/2/201    Status Achieved                 Plan - 07/23/20 1653    Clinical Impression Statement Pt is doing very well today. She is progressing though her chemotherapy with no decrease in functional status.  She has had some pain in her legs and back, but she knows how to manage it and return to previous functional level.  She feels like she is ready to discharge from PT and exercise on her own.  She knows that she can call to return to PT if she needs to.    Personal Factors and Comorbidities Comorbidity 3+    Comorbidities history of low back pain, recent laparoscopic surgery, ongoing chemo.    Stability/Clinical Decision Making Evolving/Moderate complexity    Rehab Potential Good    PT Frequency 2x / week    PT Duration 8 weeks    PT Treatment/Interventions ADLs/Self Care Home Management;Moist Heat;Therapeutic activities;Functional mobility training;Therapeutic exercise;Patient/family education;Manual techniques;Passive range of motion    PT Next Visit Plan discharge this episode    PT Home Exercise Plan Bil hip flexibility and pelvic tilt, Access Code: [email or web address] to add standing hip, walking program    Consulted and Agree with Plan of Care Patient           Patient will benefit from skilled therapeutic intervention in order to improve the following deficits and impairments:  Increased fascial restricitons, Impaired flexibility, Impaired perceived functional ability, Decreased range of motion, Increased muscle spasms  Visit Diagnosis: Unspecified disturbances of skin sensation  Other symptoms and signs involving the musculoskeletal system  Aftercare following surgery for  neoplasm     Problem List Patient Active Problem List   Diagnosis Date Noted  . Chemotherapy-induced nausea 07/23/2020  . Other constipation 07/23/2020  . Preventive measure 07/23/2020  .  Pancytopenia, acquired ([place]) 06/16/2020  . Bone pain 06/03/2020  . Generalized weakness 06/03/2020  . Peripheral neuropathy due to chemotherapy ([place]) 06/03/2020  . Malignant cachexia ([place]) 06/03/2020  . Dysuria 06/01/2020  . Liver lesion 05/13/2020  . Goals of care, counseling/discussion 05/13/2020  . 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 Post-operative state 04/07/2020  . Postoperative state 04/07/2020  . Post-menopausal bleeding 03/26/2020  . Family history of cancer in mother 03/03/2020  . Grade 2 ankle sprain, left, initial encounter 01/07/2020  . Obstructive sleep apnea syndrome 01/07/2020  . Former tobacco use 11/05/2019  . Class 2 severe obesity due to excess calories with serious comorbidity and body mass index (BMI) of 36.0 to 36.9 in adult [facility]) 11/05/2019  . Shortness of breath 10/28/2019  . Gastroesophageal reflux disease 10/28/2019  . Lateral epicondylitis of right elbow 03/21/2019  . Abnormal CXR 02/21/2019  . Persistent cough for 3 weeks or longer 02/13/2019  . BPPV (benign paroxysmal positional vertigo), right 05/31/2018  . ETD (Eustachian tube dysfunction), bilateral 03/23/2018  . Atopic dermatitis 02/28/2017  . Seborrheic keratoses, inflamed 02/28/2017  . Rheumatoid arthritis ([place]) 03/06/2015  . Thrombocytopenia ([place]) 02/26/2015  . Prediabetes 11/04/2014  . Acute sinus infection 05/26/2014  . Essential hypertension      PHYSICAL THERAPY DISCHARGE SUMMARY  Visits from Start of Care: 5  Current functional level related to goals / functional outcomes: Independent    Remaining deficits: none   Education / Equipment: Home exercise  Plan: Patient agrees to discharge.  Patient goals were met. Patient is being discharged due to meeting the stated rehab  goals.  ?????           Donato Heinz. Owens Shark PT   Norwood Levo 07/23/2020, 4:56 PM  [place], [place], [address] Phone: [phone]   Fax:  [phone]  Name: Rachel Rivas MRN: [id number] Date of Birth: 1961-09-24

## 2020-07-29 ENCOUNTER — Other Ambulatory Visit: Payer: Self-pay | Admitting: Family Medicine

## 2020-07-30 ENCOUNTER — Encounter: Payer: PRIVATE HEALTH INSURANCE | Admitting: Rehabilitation

## 2020-07-31 ENCOUNTER — Encounter: Payer: Self-pay | Admitting: Hematology and Oncology

## 2020-07-31 ENCOUNTER — Inpatient Hospital Stay: Payer: PRIVATE HEALTH INSURANCE

## 2020-07-31 ENCOUNTER — Telehealth: Payer: Self-pay | Admitting: *Deleted

## 2020-07-31 ENCOUNTER — Other Ambulatory Visit: Payer: Self-pay

## 2020-07-31 ENCOUNTER — Other Ambulatory Visit: Payer: Self-pay | Admitting: Hematology and Oncology

## 2020-07-31 ENCOUNTER — Inpatient Hospital Stay (HOSPITAL_BASED_OUTPATIENT_CLINIC_OR_DEPARTMENT_OTHER): Payer: PRIVATE HEALTH INSURANCE | Admitting: Hematology and Oncology

## 2020-07-31 ENCOUNTER — Telehealth: Payer: Self-pay | Admitting: Hematology and Oncology

## 2020-07-31 DIAGNOSIS — J45909 Unspecified asthma, uncomplicated: Secondary | ICD-10-CM | POA: Insufficient documentation

## 2020-07-31 DIAGNOSIS — K769 Liver disease, unspecified: Secondary | ICD-10-CM

## 2020-07-31 DIAGNOSIS — Z7189 Other specified counseling: Secondary | ICD-10-CM

## 2020-07-31 DIAGNOSIS — C55 Malignant neoplasm of uterus, part unspecified: Secondary | ICD-10-CM | POA: Diagnosis not present

## 2020-07-31 DIAGNOSIS — D61818 Other pancytopenia: Secondary | ICD-10-CM

## 2020-07-31 DIAGNOSIS — C549 Malignant neoplasm of corpus uteri, unspecified: Secondary | ICD-10-CM

## 2020-07-31 DIAGNOSIS — R209 Unspecified disturbances of skin sensation: Secondary | ICD-10-CM | POA: Diagnosis not present

## 2020-07-31 DIAGNOSIS — Z23 Encounter for immunization: Secondary | ICD-10-CM

## 2020-07-31 DIAGNOSIS — R531 Weakness: Secondary | ICD-10-CM

## 2020-07-31 LAB — CMP (CANCER CENTER ONLY)
ALT: 17 U/L (ref 0–44)
AST: 15 U/L (ref 15–41)
Albumin: 4.2 g/dL (ref 3.5–5.0)
Alkaline Phosphatase: 83 U/L (ref 38–126)
Anion gap: 11 (ref 5–15)
BUN: 16 mg/dL (ref 6–20)
CO2: 24 mmol/L (ref 22–32)
Calcium: 9.9 mg/dL (ref 8.9–10.3)
Chloride: 104 mmol/L (ref 98–111)
Creatinine: 1.03 mg/dL — ABNORMAL HIGH (ref 0.44–1.00)
GFR, Est AFR Am: >60 mL/min (ref >60)
GFR, Estimated: 59 mL/min — ABNORMAL LOW (ref >60)
Glucose, Bld: 217 mg/dL — ABNORMAL HIGH (ref 70–99)
Potassium: 3.8 mmol/L (ref 3.5–5.1)
Sodium: 139 mmol/L (ref 135–145)
Total Bilirubin: 0.5 mg/dL (ref 0.3–1.2)
Total Protein: 8.2 g/dL — ABNORMAL HIGH (ref 6.5–8.1)

## 2020-07-31 LAB — CBC WITH DIFFERENTIAL (CANCER CENTER ONLY)
Abs Immature Granulocytes: 0.03 10*3/uL (ref 0.00–0.07)
Basophils Absolute: 0 10*3/uL (ref 0.0–0.1)
Basophils Relative: 0 %
Eosinophils Absolute: 0 10*3/uL (ref 0.0–0.5)
Eosinophils Relative: 0 %
HCT: 34.4 % — ABNORMAL LOW (ref 36.0–46.0)
Hemoglobin: 11 g/dL — ABNORMAL LOW (ref 12.0–15.0)
Immature Granulocytes: 1 %
Lymphocytes Relative: 18 %
Lymphs Abs: 1.2 10*3/uL (ref 0.7–4.0)
MCH: 26.1 pg (ref 26.0–34.0)
MCHC: 32 g/dL (ref 30.0–36.0)
MCV: 81.5 fL (ref 80.0–100.0)
Monocytes Absolute: 0.1 10*3/uL (ref 0.1–1.0)
Monocytes Relative: 2 %
Neutro Abs: 5.3 10*3/uL (ref 1.7–7.7)
Neutrophils Relative %: 79 %
Platelet Count: 49 10*3/uL — ABNORMAL LOW (ref 150–400)
RBC: 4.22 MIL/uL (ref 3.87–5.11)
RDW: 17.4 % — ABNORMAL HIGH (ref 11.5–15.5)
WBC Count: 6.6 10*3/uL (ref 4.0–10.5)
nRBC: 0 % (ref 0.0–0.2)

## 2020-07-31 MED ORDER — SODIUM CHLORIDE 0.9 % IV SOLN
10.0000 mg | Freq: Once | INTRAVENOUS | Status: AC
  Administered 2020-07-31: 10 mg via INTRAVENOUS
  Filled 2020-07-31: qty 10

## 2020-07-31 MED ORDER — SODIUM CHLORIDE 0.9% FLUSH
10.0000 mL | INTRAVENOUS | Status: DC | PRN
  Administered 2020-07-31: 10 mL
  Filled 2020-07-31: qty 10

## 2020-07-31 MED ORDER — PALONOSETRON HCL INJECTION 0.25 MG/5ML
0.2500 mg | Freq: Once | INTRAVENOUS | Status: AC
  Administered 2020-07-31: 0.25 mg via INTRAVENOUS

## 2020-07-31 MED ORDER — SODIUM CHLORIDE 0.9 % IV SOLN
150.0000 mg | Freq: Once | INTRAVENOUS | Status: AC
  Administered 2020-07-31: 150 mg via INTRAVENOUS
  Filled 2020-07-31: qty 150

## 2020-07-31 MED ORDER — SODIUM CHLORIDE 0.9 % IV SOLN
Freq: Once | INTRAVENOUS | Status: AC
  Filled 2020-07-31: qty 250

## 2020-07-31 MED ORDER — ALBUTEROL SULFATE HFA 108 (90 BASE) MCG/ACT IN AERS: 2.0000 | INHALATION_SPRAY | Freq: Four times a day (QID) | RESPIRATORY_TRACT | 2 refills | Status: DC | PRN

## 2020-07-31 MED ORDER — PALONOSETRON HCL INJECTION 0.25 MG/5ML
INTRAVENOUS | Status: AC
  Filled 2020-07-31: qty 5

## 2020-07-31 MED ORDER — SODIUM CHLORIDE 0.9% FLUSH
10.0000 mL | Freq: Once | INTRAVENOUS | Status: AC
  Administered 2020-07-31: 10 mL
  Filled 2020-07-31: qty 10

## 2020-07-31 MED ORDER — LIDOCAINE-PRILOCAINE 2.5-2.5 % EX CREA: TOPICAL_CREAM | CUTANEOUS | 3 refills | Status: DC

## 2020-07-31 MED ORDER — SODIUM CHLORIDE 0.9 % IV SOLN
105.0000 mg/m2 | Freq: Once | INTRAVENOUS | Status: AC
  Administered 2020-07-31: 216 mg via INTRAVENOUS
  Filled 2020-07-31: qty 36

## 2020-07-31 MED ORDER — FAMOTIDINE IN NACL 20-0.9 MG/50ML-% IV SOLN
INTRAVENOUS | Status: AC
  Filled 2020-07-31: qty 50

## 2020-07-31 MED ORDER — DIPHENHYDRAMINE HCL 50 MG/ML IJ SOLN
INTRAMUSCULAR | Status: AC
  Filled 2020-07-31: qty 1

## 2020-07-31 MED ORDER — DIPHENHYDRAMINE HCL 50 MG/ML IJ SOLN
50.0000 mg | Freq: Once | INTRAMUSCULAR | Status: AC
  Administered 2020-07-31: 50 mg via INTRAVENOUS

## 2020-07-31 MED ORDER — SODIUM CHLORIDE 0.9 % IV SOLN
448.6890 mg | Freq: Once | INTRAVENOUS | Status: AC
  Administered 2020-07-31: 450 mg via INTRAVENOUS
  Filled 2020-07-31: qty 45

## 2020-07-31 MED ORDER — FAMOTIDINE IN NACL 20-0.9 MG/50ML-% IV SOLN
20.0000 mg | Freq: Once | INTRAVENOUS | Status: AC
  Administered 2020-07-31: 20 mg via INTRAVENOUS

## 2020-07-31 MED ORDER — HEPARIN SOD (PORK) LOCK FLUSH 100 UNIT/ML IV SOLN
500.0000 [IU] | Freq: Once | INTRAVENOUS | Status: AC | PRN
  Administered 2020-07-31: 500 [IU]
  Filled 2020-07-31: qty 5

## 2020-07-31 NOTE — Assessment & Plan Note (Signed)
I have reviewed multiple imaging studies with the patient in our previous visit Overall, I see stability of abnormal findings from previous CT imaging The soft tissue thickening near the vagina area could represent postop changes, and I plan to repeat in December She is not symptomatic Continue to observe for now, with plan to continue treatment as scheduled Her last cycle should finish by end of October

## 2020-07-31 NOTE — Progress Notes (Signed)
[facility] OFFICE PROGRESS NOTE  Patient Care Team: [name], MD as PCP - General (Family Medicine) Buford Dresser, MD as PCP - Cardiology (Cardiology) Awanda Mink Craige Cotta, RN as Oncology Nurse Navigator (Oncology)  ASSESSMENT & PLAN:  Adenosarcoma of body of uterus [facility]) I have reviewed multiple imaging studies with the patient in our previous visit Overall, I see stability of abnormal findings from previous CT imaging The soft tissue thickening near the vagina area could represent postop changes, and I plan to repeat in December She is not symptomatic Continue to observe for now, with plan to continue treatment as scheduled Her last cycle should finish by end of October  Pancytopenia, acquired [facility]) Despite severe pancytopenia, she is not symptomatic We discussed the risk and benefits of pursuing treatment without delay and she is in agreement Her low platelet count is likely due to her chronic ITP She is not symptomatic Observe for now Due to stability of recent imaging study, I plan to reduce the dose of carboplatin a little bit   Generalized weakness She has generalized deconditioning She has completed physical therapy We discussed potential reduce hours if she desired to return back to work We discussed the importance of dietary modification and physical activity as tolerated while on treatment   No orders of the defined types were placed in this encounter.   All questions were answered. The patient knows to call the clinic with any problems, questions or concerns. The total time spent in the appointment was 20 minutes encounter with patients including review of chart and various tests results, discussions about plan of care and coordination of care plan   Heath Lark, MD 07/31/2020 9:04 AM  INTERVAL HISTORY: Please see below for problem oriented charting. She is seen prior to cycle 4 of treatment Since last time I saw her, she denies much  residual side effects from recent treatment She denies bone pain, nausea or changes in bowel habits The patient denies any recent signs or symptoms of bleeding such as spontaneous epistaxis, hematuria or hematochezia. She complained of fatigue and shortness of breath on exertion She has completed physical therapy She is in the process of talking to her employers about returning back to work with reduced hours  SUMMARY OF ONCOLOGIC HISTORY: Oncology History Overview Note  MSI stable   Adenosarcoma of body of uterus ([place])  03/03/2020 Initial Diagnosis   She developed PMB in early April after going through menopause at approximately age 59. She saw her PCP on 4/13 for bleeding and an ultrasound was obtained showing a thickened endometrial lining and 3-4cm mass within the endocervical canal. She was referred to [facility], started on Megace and ultimately underwent a TRH/BSO on 5/18 for abnormal uterine bleeding with resulting anemia, large prolapsing fibroid.    03/06/2020 Imaging   US pelvis Markedly thickened heterogeneous, and hypervascular endometrial complex up to 38 mm thick highly concerning for endometrial neoplasm.   Additional more focal 3.0 x 2.8 x 5.3 cm diameter mass within endocervical canal, may represent extension of above endometrial pathology versus separate endocervical tumor.   Tissue diagnosis recommended.   Nonvisualization of ovaries.   04/07/2020 Pathology Results   A. UTERUS, CERVIX AND BILATERAL FALLOPIAN TUBES AND OVARIES, HYSTERECTOMY AND BILATERAL SALPINGO-OOPHORECTOMY: - Mullerian adenosarcoma of endometrium. - Tumor limited to the uterus. - Tumor invades to less than half of the myometrium. - No involvement of adnexa. - Margins of resection are not involved. - See oncology table and comment. ONCOLOGY  TABLE: UTERUS, SARCOMA: Procedure: Total hysterectomy and bilateral salpingo-oophorectomy Specimen Integrity: The uterus, cervix and bilateral fallopian tubes and  ovaries were received intact. The nodule clinically identified as vaginally prolapsed uterine fibroid was received separate and disrupted. Tumor Size: Greatest dimension: 7.5 cm Histologic Type: Mullerian adenosarcoma with overgrowth of the epithelial and stromal components Histologic Grade: The epithelial component has proliferated to form endometrioid adenocarcinoma which ranges from low grade to high grade. The stromal component focally shows sarcomatous overgrowth. Myometrial Invasion: Present Depth of invasion: 4 mm Myometrial thickness: 30 mm Other Tissue/Organ Involvement: Not identified Margins: Uninvolved by sarcoma Lymphovascular Invasion: Not identified Regional Lymph Nodes: No lymph nodes submitted or found Pathologic Stage Classification (pTNM, AJCC 8th Edition): pT1b, pNX Additional Pathologic Findings: Adenomyosis. Leiomyomata.   04/07/2020 Surgery   PRE-OPERATIVE DIAGNOSIS:  Large prolapsed fibroid vaginally, menometrorrhagia with anemia   POST-OPERATIVE DIAGNOSIS:  Large prolapsed fibroid vaginally, menometrorrhagia with anemia.  Omental adhesions with anterior abdominal wall.   PROCEDURE:  Procedure(s): XI ROBOTIC TOTAL LAPAROSCOPIC HYSTERECTOMY, BILATERAL SALPINGO OOPHORECTOMY/LYSIS OF ADHESIONS/VAGINAL MYOMECTOMY    FINDINGS: Large necrotic vaginally prolapsed uterine myoma.  Uterus with small subserosal/intramural myoma.  Bilateral tubes post tubal ligation.  Bilateral normal ovaries.  Omental adhesions with anterior abdominal wall.   05/01/2020 Imaging   Ct scan of chest, abdomen and pelvis 1. Multiple ill-defined low density liver masses, suspicious for metastases. These could be further characterized with pre and postcontrast magnetic resonance imaging of the liver. 2. Moderate diffuse vaginal wall thickening, possibly representing edema associated with the patient's recent surgery. 3. Small hiatal hernia. 4. Minimal coronary artery atheromatous  calcifications. 5. Cholelithiasis. 6. Tiny rounded area of low density in the posterior spleen, too small to characterize.   05/11/2020 Cancer Staging   Staging form: Corpus Uteri - Adenosarcoma, AJCC 8th Edition - Pathologic: Stage IB (pT1b, pN0, cM0) - Signed by Heath Lark, MD on 05/15/2020   05/14/2020 Imaging   MRI liver 1. The 2 dominant right hepatic lobe lesions have MR imaging features most consistent with benign cavernous hemangioma. The remaining tiny liver lesions are too small to characterize. Statistically, while these are likely benign, follow-up MRI in 3 months recommended to ensure stability. 2. No other findings to suggest metastatic disease.   05/22/2020 Procedure   Placement of a subcutaneous port device. Catheter tip at the superior cavoatrial junction   05/29/2020 -  Chemotherapy   The patient had carboplatin and taxol for chemotherapy treatment.     07/22/2020 Imaging   1. Status post hysterectomy and bilateral oophorectomy. Irregular soft tissue density in the region of the vaginal cuff is relatively similar to on the prior exam. Although this could represent developing scar or chronic hematoma, residual disease cannot be excluded. This could either be re-evaluated at follow-up or if a more aggressive approach is desired, characterized with PET. 2. No other evidence of metastatic disease in the abdomen or pelvis. 3. Hepatic hemangiomas, as before. Smaller liver lesions are unchanged, favoring a benign etiology. 4. Cholelithiasis. 5. Tiny hiatal hernia. 6. Aortic Atherosclerosis (ICD10-I70.0).     REVIEW OF SYSTEMS:   Constitutional: Denies fevers, chills or abnormal weight loss Eyes: Denies blurriness of vision Ears, nose, mouth, throat, and face: Denies mucositis or sore throat Respiratory: Denies cough, dyspnea or wheezes Cardiovascular: Denies palpitation, chest discomfort or lower extremity swelling Gastrointestinal:  Denies nausea, heartburn or change in bowel  habits Skin: Denies abnormal skin rashes Lymphatics: Denies new lymphadenopathy or easy bruising Neurological:Denies numbness, tingling or new weaknesses  Behavioral/Psych: Mood is stable, no new changes  All other systems were reviewed with the patient and are negative.  I have reviewed the past medical history, past surgical history, social history and family history with the patient and they are unchanged from previous note.  ALLERGIES:  is allergic to latex and sulfur.  MEDICATIONS:  Current Outpatient Medications  Medication Sig Dispense Refill  . albuterol (VENTOLIN HFA) 108 (90 Base) MCG/ACT inhaler Inhale 2 puffs into the lungs every 6 (six) hours as needed for wheezing or shortness of breath. 8 g 2  . cyclobenzaprine (FLEXERIL) 10 MG tablet Take 1 tablet (10 mg total) by mouth at bedtime as needed for muscle spasms. 15 tablet 0  . dexamethasone (DECADRON) 4 MG tablet Take 2 tabs at the night before and 2 tab the morning of chemotherapy, every 3 weeks, by mouth x 6 cycles 36 tablet 6  . famotidine (PEPCID) 20 MG tablet TAKE 1 TABLET BY MOUTH TWICE DAILY FOR HEART 180 tablet 1  . hydrochlorothiazide (MICROZIDE) 12.5 MG capsule Take 1 capsule by mouth once daily 90 capsule 1  . HYDROmorphone (DILAUDID) 4 MG tablet Take 1 tablet (4 mg total) by mouth every 6 (six) hours as needed for severe pain. 30 tablet 0  . lidocaine-prilocaine (EMLA) cream Apply to affected area once 30 g 3  . losartan (COZAAR) 50 MG tablet Take 1 tablet by mouth once daily 90 tablet 1  . ondansetron (ZOFRAN) 8 MG tablet Take 1 tablet (8 mg total) by mouth every 8 (eight) hours as needed. 30 tablet 1  . prochlorperazine (COMPAZINE) 10 MG tablet Take 1 tablet (10 mg total) by mouth every 6 (six) hours as needed (Nausea or vomiting). 30 tablet 1   No current facility-administered medications for this visit.   Facility-Administered Medications Ordered in Other Visits  Medication Dose Route Frequency Provider Last  Rate Last Admin  . CARBOplatin (PARAPLATIN) 450 mg in sodium chloride 0.9 % 250 mL chemo infusion  450 mg Intravenous Once Alvy Bimler, Soraiya Ahner, MD      . dexamethasone (DECADRON) 10 mg in sodium chloride 0.9 % 50 mL IVPB  10 mg Intravenous Once Alvy Bimler, Sabreen Kitchen, MD      . diphenhydrAMINE (BENADRYL) injection 50 mg  50 mg Intravenous Once Alvy Bimler, Gerardine Peltz, MD      . famotidine (PEPCID) IVPB 20 mg premix  20 mg Intravenous Once Alvy Bimler, Jackalyn Haith, MD      . fosaprepitant (EMEND) 150 mg in sodium chloride 0.9 % 145 mL IVPB  150 mg Intravenous Once Alvy Bimler, Lateasha Breuer, MD      . heparin lock flush 100 unit/mL  500 Units Intracatheter Once PRN Alvy Bimler, Andersson Larrabee, MD      . PACLitaxel (TAXOL) 216 mg in sodium chloride 0.9 % 250 mL chemo infusion (> 26m/m2)  105 mg/m2 (Treatment Plan Recorded) Intravenous Once GAlvy Bimler Young Mulvey, MD      . palonosetron (ALOXI) injection 0.25 mg  0.25 mg Intravenous Once Trayvon Trumbull, MD      . sodium chloride flush (NS) 0.9 % injection 10 mL  10 mL Intracatheter PRN GAlvy Bimler Mystie Ormand, MD        PHYSICAL EXAMINATION: ECOG PERFORMANCE STATUS: 1 - Symptomatic but completely ambulatory  Vitals:   07/31/20 0815  BP: (!) 158/85  Pulse: 98  Resp: 18  Temp: (!) 97.4 F (36.3 C)  SpO2: 100%   Filed Weights   07/31/20 0815  Weight: 204 lb (92.5 kg)    GENERAL:alert, no distress and comfortable NEURO:  alert & oriented x 3 with fluent speech, no focal motor/sensory deficits  LABORATORY DATA:  I have reviewed the data as listed    Component Value Date/Time   NA 139 07/31/2020 0801   NA 142 04/07/2017 0810   K 3.8 07/31/2020 0801   K 3.8 04/07/2017 0810   CL 104 07/31/2020 0801   CO2 24 07/31/2020 0801   CO2 28 04/07/2017 0810   GLUCOSE 217 (H) 07/31/2020 0801   GLUCOSE 100 04/07/2017 0810   BUN 16 07/31/2020 0801   BUN 16.8 04/07/2017 0810   CREATININE 1.03 (H) 07/31/2020 0801   CREATININE 0.9 04/07/2017 0810   CALCIUM 9.9 07/31/2020 0801   CALCIUM 9.9 04/07/2017 0810   PROT 8.2 (H) 07/31/2020 0801    PROT 8.0 04/07/2017 0810   ALBUMIN 4.2 07/31/2020 0801   ALBUMIN 4.4 04/07/2017 0810   AST 15 07/31/2020 0801   AST 18 04/07/2017 0810   ALT 17 07/31/2020 0801   ALT 23 04/07/2017 0810   ALKPHOS 83 07/31/2020 0801   ALKPHOS 79 04/07/2017 0810   BILITOT 0.5 07/31/2020 0801   BILITOT 0.70 04/07/2017 0810   GFRNONAA 59 (L) 07/31/2020 0801   GFRAA >60 07/31/2020 0801    No results found for: SPEP, UPEP  Lab Results  Component Value Date   WBC 6.6 07/31/2020   NEUTROABS 5.3 07/31/2020   HGB 11.0 (L) 07/31/2020   HCT 34.4 (L) 07/31/2020   MCV 81.5 07/31/2020   PLT 49 (L) 07/31/2020      Chemistry      Component Value Date/Time   NA 139 07/31/2020 0801   NA 142 04/07/2017 0810   K 3.8 07/31/2020 0801   K 3.8 04/07/2017 0810   CL 104 07/31/2020 0801   CO2 24 07/31/2020 0801   CO2 28 04/07/2017 0810   BUN 16 07/31/2020 0801   BUN 16.8 04/07/2017 0810   CREATININE 1.03 (H) 07/31/2020 0801   CREATININE 0.9 04/07/2017 0810      Component Value Date/Time   CALCIUM 9.9 07/31/2020 0801   CALCIUM 9.9 04/07/2017 0810   ALKPHOS 83 07/31/2020 0801   ALKPHOS 79 04/07/2017 0810   AST 15 07/31/2020 0801   AST 18 04/07/2017 0810   ALT 17 07/31/2020 0801   ALT 23 04/07/2017 0810   BILITOT 0.5 07/31/2020 0801   BILITOT 0.70 04/07/2017 0810

## 2020-07-31 NOTE — Assessment & Plan Note (Signed)
Despite severe pancytopenia, she is not symptomatic We discussed the risk and benefits of pursuing treatment without delay and she is in agreement Her low platelet count is likely due to her chronic ITP She is not symptomatic Observe for now Due to stability of recent imaging study, I plan to reduce the dose of carboplatin a little bit

## 2020-07-31 NOTE — Progress Notes (Signed)
   Covid-19 Vaccination Clinic  Name:  Rachel Rivas    MRN: [id number] DOB: [birth year]  07/31/2020  [name] was observed post Covid-19 immunization for 15 minutes without incident. She was provided with Vaccine Information Sheet and instruction to access the V-Safe system.   Ms. Reach was instructed to call 911 with any severe reactions post vaccine: Marland Kitchen Difficulty breathing  . Swelling of face and throat  . A fast heartbeat  . A bad rash all over body  . Dizziness and weakness   Immunizations Administered    Name Date Dose VIS Date Route   Pfizer COVID-19 Vaccine 07/31/2020  3:03 PM 0.3 mL 01/15/2019 Intramuscular   Manufacturer: [place]   Lot: [id number]   [place]: [id number]

## 2020-07-31 NOTE — Telephone Encounter (Signed)
Scheduled appointments per 9/10. Patient is aware of appointments. I also gave her a calendar print out.

## 2020-07-31 NOTE — Telephone Encounter (Signed)
Per Dr.Gorsuch, OK to proceed with tx with low PLT count. Dose will be adjusted.

## 2020-07-31 NOTE — Patient Instructions (Signed)
[facility] °Discharge Instructions for Patients Receiving Chemotherapy ° °Today you received the following chemotherapy agents Taxol; Carboplatin ° °To help prevent nausea and vomiting after your treatment, we encourage you to take your nausea medication as directed °  °If you develop nausea and vomiting that is not controlled by your nausea medication, call the clinic.  ° °BELOW ARE SYMPTOMS THAT SHOULD BE REPORTED IMMEDIATELY: °· *FEVER GREATER THAN 100.5 F °· *CHILLS WITH OR WITHOUT FEVER °· NAUSEA AND VOMITING THAT IS NOT CONTROLLED WITH YOUR NAUSEA MEDICATION °· *UNUSUAL SHORTNESS OF BREATH °· *UNUSUAL BRUISING OR BLEEDING °· TENDERNESS IN MOUTH AND THROAT WITH OR WITHOUT PRESENCE OF ULCERS °· *URINARY PROBLEMS °· *BOWEL PROBLEMS °· UNUSUAL RASH °Items with * indicate a potential emergency and should be followed up as soon as possible. ° °Feel free to call the clinic should you have any questions or concerns. The clinic phone number is [phone]. ° °Please show the CHEMO ALERT CARD at check-in to the Emergency Department and triage nurse. ° ° °

## 2020-07-31 NOTE — Assessment & Plan Note (Signed)
She has generalized deconditioning She has completed physical therapy We discussed potential reduce hours if she desired to return back to work We discussed the importance of dietary modification and physical activity as tolerated while on treatment

## 2020-08-14 ENCOUNTER — Encounter: Payer: Self-pay | Admitting: Hematology and Oncology

## 2020-08-14 ENCOUNTER — Telehealth: Payer: Self-pay

## 2020-08-14 NOTE — Telephone Encounter (Signed)
TC from Pt. Stating she has a rash on the left side of her neck. She stated that she used hydrocortisone cream. Asked pt to send picture in my chart. Dr. Alvy Bimler stated she could come in to be seen. Pt. Stated rash is not painful just itchy and  Since she used the hydrocortisone cream it has stopped itching. Informed Pt to watch it over the weekend and continue using hydrocortisone cream and if it's not better by Monday to give a return call. Pt. Verbalized understanding No further problems or concerns noted.

## 2020-08-17 ENCOUNTER — Telehealth: Payer: Self-pay

## 2020-08-17 NOTE — Telephone Encounter (Signed)
Called and left a message asking her to call the office back. 

## 2020-08-17 NOTE — Telephone Encounter (Signed)
-----   Message from Heath Lark, MD sent at 08/17/2020  8:12 AM EDT ----- Regarding: can you call and ask how is her rash

## 2020-08-18 NOTE — Telephone Encounter (Signed)
She called back and left a message. The rash has gone away completely. Thanks for checking on her.

## 2020-08-18 NOTE — Telephone Encounter (Signed)
Called and left a message asking her to call the office with update on rash. If unable to call she can send a [employer].

## 2020-08-21 ENCOUNTER — Inpatient Hospital Stay: Payer: PRIVATE HEALTH INSURANCE | Attending: Gynecologic Oncology

## 2020-08-21 ENCOUNTER — Inpatient Hospital Stay: Payer: PRIVATE HEALTH INSURANCE

## 2020-08-21 ENCOUNTER — Encounter: Payer: Self-pay | Admitting: Hematology and Oncology

## 2020-08-21 ENCOUNTER — Inpatient Hospital Stay (HOSPITAL_BASED_OUTPATIENT_CLINIC_OR_DEPARTMENT_OTHER): Payer: PRIVATE HEALTH INSURANCE | Admitting: Hematology and Oncology

## 2020-08-21 ENCOUNTER — Other Ambulatory Visit: Payer: Self-pay

## 2020-08-21 ENCOUNTER — Other Ambulatory Visit: Payer: Self-pay | Admitting: Hematology and Oncology

## 2020-08-21 VITALS — BP 145/73 | HR 93 | Temp 97.1°F | Resp 18 | Ht 63.0 in | Wt 205.6 lb

## 2020-08-21 DIAGNOSIS — C55 Malignant neoplasm of uterus, part unspecified: Secondary | ICD-10-CM

## 2020-08-21 DIAGNOSIS — R21 Rash and other nonspecific skin eruption: Secondary | ICD-10-CM | POA: Insufficient documentation

## 2020-08-21 DIAGNOSIS — Z23 Encounter for immunization: Secondary | ICD-10-CM | POA: Insufficient documentation

## 2020-08-21 DIAGNOSIS — Z5111 Encounter for antineoplastic chemotherapy: Secondary | ICD-10-CM | POA: Insufficient documentation

## 2020-08-21 DIAGNOSIS — T451X5D Adverse effect of antineoplastic and immunosuppressive drugs, subsequent encounter: Secondary | ICD-10-CM

## 2020-08-21 DIAGNOSIS — Z7189 Other specified counseling: Secondary | ICD-10-CM

## 2020-08-21 DIAGNOSIS — R739 Hyperglycemia, unspecified: Secondary | ICD-10-CM | POA: Insufficient documentation

## 2020-08-21 DIAGNOSIS — Z79818 Long term (current) use of other agents affecting estrogen receptors and estrogen levels: Secondary | ICD-10-CM | POA: Diagnosis not present

## 2020-08-21 DIAGNOSIS — G620 Drug-induced polyneuropathy: Secondary | ICD-10-CM

## 2020-08-21 DIAGNOSIS — C549 Malignant neoplasm of corpus uteri, unspecified: Secondary | ICD-10-CM

## 2020-08-21 DIAGNOSIS — D696 Thrombocytopenia, unspecified: Secondary | ICD-10-CM

## 2020-08-21 DIAGNOSIS — K769 Liver disease, unspecified: Secondary | ICD-10-CM

## 2020-08-21 DIAGNOSIS — Z90722 Acquired absence of ovaries, bilateral: Secondary | ICD-10-CM | POA: Diagnosis not present

## 2020-08-21 DIAGNOSIS — Z90710 Acquired absence of both cervix and uterus: Secondary | ICD-10-CM | POA: Diagnosis not present

## 2020-08-21 DIAGNOSIS — T451X5A Adverse effect of antineoplastic and immunosuppressive drugs, initial encounter: Secondary | ICD-10-CM

## 2020-08-21 DIAGNOSIS — L709 Acne, unspecified: Secondary | ICD-10-CM | POA: Insufficient documentation

## 2020-08-21 DIAGNOSIS — T50905A Adverse effect of unspecified drugs, medicaments and biological substances, initial encounter: Secondary | ICD-10-CM

## 2020-08-21 LAB — CBC WITH DIFFERENTIAL (CANCER CENTER ONLY)
Abs Immature Granulocytes: 0.04 10*3/uL (ref 0.00–0.07)
Basophils Absolute: 0 10*3/uL (ref 0.0–0.1)
Basophils Relative: 0 %
Eosinophils Absolute: 0 10*3/uL (ref 0.0–0.5)
Eosinophils Relative: 0 %
HCT: 32.9 % — ABNORMAL LOW (ref 36.0–46.0)
Hemoglobin: 10.8 g/dL — ABNORMAL LOW (ref 12.0–15.0)
Immature Granulocytes: 1 %
Lymphocytes Relative: 22 %
Lymphs Abs: 1.5 10*3/uL (ref 0.7–4.0)
MCH: 27.1 pg (ref 26.0–34.0)
MCHC: 32.8 g/dL (ref 30.0–36.0)
MCV: 82.5 fL (ref 80.0–100.0)
Monocytes Absolute: 0.1 10*3/uL (ref 0.1–1.0)
Monocytes Relative: 1 %
Neutro Abs: 5.2 10*3/uL (ref 1.7–7.7)
Neutrophils Relative %: 76 %
Platelet Count: 70 10*3/uL — ABNORMAL LOW (ref 150–400)
RBC: 3.99 MIL/uL (ref 3.87–5.11)
RDW: 19.4 % — ABNORMAL HIGH (ref 11.5–15.5)
WBC Count: 6.8 10*3/uL (ref 4.0–10.5)
nRBC: 0 % (ref 0.0–0.2)

## 2020-08-21 LAB — CMP (CANCER CENTER ONLY)
ALT: 26 U/L (ref 0–44)
AST: 18 U/L (ref 15–41)
Albumin: 4.3 g/dL (ref 3.5–5.0)
Alkaline Phosphatase: 92 U/L (ref 38–126)
Anion gap: 9 (ref 5–15)
BUN: 20 mg/dL (ref 6–20)
CO2: 26 mmol/L (ref 22–32)
Calcium: 10.1 mg/dL (ref 8.9–10.3)
Chloride: 101 mmol/L (ref 98–111)
Creatinine: 0.99 mg/dL (ref 0.44–1.00)
GFR, Est AFR Am: >60 mL/min (ref >60)
GFR, Estimated: >60 mL/min (ref >60)
Glucose, Bld: 272 mg/dL — ABNORMAL HIGH (ref 70–99)
Potassium: 3.6 mmol/L (ref 3.5–5.1)
Sodium: 136 mmol/L (ref 135–145)
Total Bilirubin: 0.5 mg/dL (ref 0.3–1.2)
Total Protein: 8.3 g/dL — ABNORMAL HIGH (ref 6.5–8.1)

## 2020-08-21 MED ORDER — PALONOSETRON HCL INJECTION 0.25 MG/5ML
INTRAVENOUS | Status: AC
  Filled 2020-08-21: qty 5

## 2020-08-21 MED ORDER — INSULIN REGULAR HUMAN 100 UNIT/ML IJ SOLN
10.0000 [IU] | Freq: Once | INTRAMUSCULAR | Status: AC
  Administered 2020-08-21: 10 [IU] via SUBCUTANEOUS

## 2020-08-21 MED ORDER — DIPHENHYDRAMINE HCL 50 MG/ML IJ SOLN
50.0000 mg | Freq: Once | INTRAMUSCULAR | Status: AC
  Administered 2020-08-21: 50 mg via INTRAVENOUS

## 2020-08-21 MED ORDER — SODIUM CHLORIDE 0.9% FLUSH
10.0000 mL | Freq: Once | INTRAVENOUS | Status: AC
  Administered 2020-08-21: 10 mL
  Filled 2020-08-21: qty 10

## 2020-08-21 MED ORDER — SODIUM CHLORIDE 0.9 % IV SOLN
10.0000 mg | Freq: Once | INTRAVENOUS | Status: AC
  Administered 2020-08-21: 10 mg via INTRAVENOUS
  Filled 2020-08-21: qty 10

## 2020-08-21 MED ORDER — SODIUM CHLORIDE 0.9 % IV SOLN
448.6890 mg | Freq: Once | INTRAVENOUS | Status: AC
  Administered 2020-08-21: 450 mg via INTRAVENOUS
  Filled 2020-08-21: qty 45

## 2020-08-21 MED ORDER — SODIUM CHLORIDE 0.9 % IV SOLN
Freq: Once | INTRAVENOUS | Status: AC
  Filled 2020-08-21: qty 250

## 2020-08-21 MED ORDER — FAMOTIDINE IN NACL 20-0.9 MG/50ML-% IV SOLN
20.0000 mg | Freq: Once | INTRAVENOUS | Status: AC
  Administered 2020-08-21: 20 mg via INTRAVENOUS

## 2020-08-21 MED ORDER — SODIUM CHLORIDE 0.9% FLUSH
10.0000 mL | INTRAVENOUS | Status: DC | PRN
  Administered 2020-08-21: 10 mL
  Filled 2020-08-21: qty 10

## 2020-08-21 MED ORDER — HEPARIN SOD (PORK) LOCK FLUSH 100 UNIT/ML IV SOLN
500.0000 [IU] | Freq: Once | INTRAVENOUS | Status: AC | PRN
  Administered 2020-08-21: 500 [IU]
  Filled 2020-08-21: qty 5

## 2020-08-21 MED ORDER — FAMOTIDINE IN NACL 20-0.9 MG/50ML-% IV SOLN
INTRAVENOUS | Status: AC
  Filled 2020-08-21: qty 50

## 2020-08-21 MED ORDER — SODIUM CHLORIDE 0.9 % IV SOLN
87.5000 mg/m2 | Freq: Once | INTRAVENOUS | Status: AC
  Administered 2020-08-21: 180 mg via INTRAVENOUS
  Filled 2020-08-21: qty 30

## 2020-08-21 MED ORDER — SODIUM CHLORIDE 0.9 % IV SOLN
150.0000 mg | Freq: Once | INTRAVENOUS | Status: AC
  Administered 2020-08-21: 150 mg via INTRAVENOUS
  Filled 2020-08-21: qty 150

## 2020-08-21 MED ORDER — PALONOSETRON HCL INJECTION 0.25 MG/5ML
0.2500 mg | Freq: Once | INTRAVENOUS | Status: AC
  Administered 2020-08-21: 0.25 mg via INTRAVENOUS

## 2020-08-21 MED ORDER — GABAPENTIN 300 MG PO CAPS: 300.0000 mg | ORAL_CAPSULE | Freq: Two times a day (BID) | ORAL | 11 refills | Status: DC

## 2020-08-21 MED ORDER — DIPHENHYDRAMINE HCL 50 MG/ML IJ SOLN
INTRAMUSCULAR | Status: AC
  Filled 2020-08-21: qty 1

## 2020-08-21 MED ORDER — INSULIN REGULAR HUMAN 100 UNIT/ML IJ SOLN
INTRAMUSCULAR | Status: AC
  Filled 2020-08-21: qty 1

## 2020-08-21 NOTE — Assessment & Plan Note (Signed)
Her recent Ct showed soft tissue thickening near the vagina area could represent postop changes, and I plan to repeat in December She is not symptomatic With her progressive neuropathy, I recommend medication/Taxol dose change With her recent severe hyperglycemia and acneform rash, I recommend eliminating oral premedication dexamethasone the morning of treatment Once we complete cycle 6 of treatment, I plan to repeat CT imaging in December

## 2020-08-21 NOTE — Progress Notes (Signed)
[facility] OFFICE PROGRESS NOTE  Patient Care Team: [name], MD as PCP - General (Family Medicine) Buford Dresser, MD as PCP - Cardiology (Cardiology) Awanda Mink Craige Cotta, RN as Oncology Nurse Navigator (Oncology)  ASSESSMENT & PLAN:  Adenosarcoma of body of uterus [facility]) Her recent Ct showed soft tissue thickening near the vagina area could represent postop changes, and I plan to repeat in December She is not symptomatic With her progressive neuropathy, I recommend medication/Taxol dose change With her recent severe hyperglycemia and acneform rash, I recommend eliminating oral premedication dexamethasone the morning of treatment Once we complete cycle 6 of treatment, I plan to repeat CT imaging in December  Peripheral neuropathy due to chemotherapy [facility]) Her neuropathy is worse I plan to reduce the dose of Taxol further I also recommend trial of gabapentin she has mild peripheral neuropathy, likely related to side effects of treatment. I plan to reduce the dose of treatment as outlined above.  I explained to the patient the rationale of this strategy and reassured the patient it would not compromise the efficacy of treatment   Thrombocytopenia (HCC) Her low platelet count is likely due to her chronic ITP She is not symptomatic Observe for now  Drug-induced hyperglycemia We discussed dietary modification I also plan to omit oral premedication dexamethasone next visit  Acne This is likely precipitated by recent steroid As above, I plan to eliminate oral premed dexamethasone in the morning of chemo We also discussed conservative approach with over-the-counter facial wash   No orders of the defined types were placed in this encounter.   All questions were answered. The patient knows to call the clinic with any problems, questions or concerns. The total time spent in the appointment was 40 minutes encounter with patients including review of chart and  various tests results, discussions about plan of care and coordination of care plan   Heath Lark, MD 08/21/2020 10:03 AM  INTERVAL HISTORY: Please see below for problem oriented charting. She returns with her daughter and seen prior to cycle #5 of treatment She tolerated recent treatment well except for facial rash and slight worsening peripheral neuropathy She also have a rash over her shoulder which was likely eczema, resolved with over-the-counter hydrocortisone cream Denies recent nausea or constipation No recent infection, fever or chills The patient denies any recent signs or symptoms of bleeding such as spontaneous epistaxis, hematuria or hematochezia.   SUMMARY OF ONCOLOGIC HISTORY: Oncology History Overview Note  MSI stable   Adenosarcoma of body of uterus ([place])  03/03/2020 Initial Diagnosis   She developed PMB in early April after going through menopause at approximately age 1. She saw her PCP on 4/13 for bleeding and an ultrasound was obtained showing a thickened endometrial lining and 3-4cm mass within the endocervical canal. She was referred to [facility], started on Megace and ultimately underwent a TRH/BSO on 5/18 for abnormal uterine bleeding with resulting anemia, large prolapsing fibroid.    03/06/2020 Imaging   US pelvis Markedly thickened heterogeneous, and hypervascular endometrial complex up to 38 mm thick highly concerning for endometrial neoplasm.   Additional more focal 3.0 x 2.8 x 5.3 cm diameter mass within endocervical canal, may represent extension of above endometrial pathology versus separate endocervical tumor.   Tissue diagnosis recommended.   Nonvisualization of ovaries.   04/07/2020 Pathology Results   A. UTERUS, CERVIX AND BILATERAL FALLOPIAN TUBES AND OVARIES, HYSTERECTOMY AND BILATERAL SALPINGO-OOPHORECTOMY: - Mullerian adenosarcoma of endometrium. - Tumor limited to the uterus. -  Tumor invades to less than half of the myometrium. - No involvement of  adnexa. - Margins of resection are not involved. - See oncology table and comment. ONCOLOGY TABLE: UTERUS, SARCOMA: Procedure: Total hysterectomy and bilateral salpingo-oophorectomy Specimen Integrity: The uterus, cervix and bilateral fallopian tubes and ovaries were received intact. The nodule clinically identified as vaginally prolapsed uterine fibroid was received separate and disrupted. Tumor Size: Greatest dimension: 7.5 cm Histologic Type: Mullerian adenosarcoma with overgrowth of the epithelial and stromal components Histologic Grade: The epithelial component has proliferated to form endometrioid adenocarcinoma which ranges from low grade to high grade. The stromal component focally shows sarcomatous overgrowth. Myometrial Invasion: Present Depth of invasion: 4 mm Myometrial thickness: 30 mm Other Tissue/Organ Involvement: Not identified Margins: Uninvolved by sarcoma Lymphovascular Invasion: Not identified Regional Lymph Nodes: No lymph nodes submitted or found Pathologic Stage Classification (pTNM, AJCC 8th Edition): pT1b, pNX Additional Pathologic Findings: Adenomyosis. Leiomyomata.   04/07/2020 Surgery   PRE-OPERATIVE DIAGNOSIS:  Large prolapsed fibroid vaginally, menometrorrhagia with anemia   POST-OPERATIVE DIAGNOSIS:  Large prolapsed fibroid vaginally, menometrorrhagia with anemia.  Omental adhesions with anterior abdominal wall.   PROCEDURE:  Procedure(s): XI ROBOTIC TOTAL LAPAROSCOPIC HYSTERECTOMY, BILATERAL SALPINGO OOPHORECTOMY/LYSIS OF ADHESIONS/VAGINAL MYOMECTOMY    FINDINGS: Large necrotic vaginally prolapsed uterine myoma.  Uterus with small subserosal/intramural myoma.  Bilateral tubes post tubal ligation.  Bilateral normal ovaries.  Omental adhesions with anterior abdominal wall.   05/01/2020 Imaging   Ct scan of chest, abdomen and pelvis 1. Multiple ill-defined low density liver masses, suspicious for metastases. These could be further characterized with pre  and postcontrast magnetic resonance imaging of the liver. 2. Moderate diffuse vaginal wall thickening, possibly representing edema associated with the patient's recent surgery. 3. Small hiatal hernia. 4. Minimal coronary artery atheromatous calcifications. 5. Cholelithiasis. 6. Tiny rounded area of low density in the posterior spleen, too small to characterize.   05/11/2020 Cancer Staging   Staging form: Corpus Uteri - Adenosarcoma, AJCC 8th Edition - Pathologic: Stage IB (pT1b, pN0, cM0) - Signed by Heath Lark, MD on 05/15/2020   05/14/2020 Imaging   MRI liver 1. The 2 dominant right hepatic lobe lesions have MR imaging features most consistent with benign cavernous hemangioma. The remaining tiny liver lesions are too small to characterize. Statistically, while these are likely benign, follow-up MRI in 3 months recommended to ensure stability. 2. No other findings to suggest metastatic disease.   05/22/2020 Procedure   Placement of a subcutaneous port device. Catheter tip at the superior cavoatrial junction   05/29/2020 -  Chemotherapy   The patient had carboplatin and taxol for chemotherapy treatment.     07/22/2020 Imaging   1. Status post hysterectomy and bilateral oophorectomy. Irregular soft tissue density in the region of the vaginal cuff is relatively similar to on the prior exam. Although this could represent developing scar or chronic hematoma, residual disease cannot be excluded. This could either be re-evaluated at follow-up or if a more aggressive approach is desired, characterized with PET. 2. No other evidence of metastatic disease in the abdomen or pelvis. 3. Hepatic hemangiomas, as before. Smaller liver lesions are unchanged, favoring a benign etiology. 4. Cholelithiasis. 5. Tiny hiatal hernia. 6. Aortic Atherosclerosis (ICD10-I70.0).     REVIEW OF SYSTEMS:   Constitutional: Denies fevers, chills or abnormal weight loss Eyes: Denies blurriness of vision Ears, nose, mouth,  throat, and face: Denies mucositis or sore throat Respiratory: Denies cough, dyspnea or wheezes Cardiovascular: Denies palpitation, chest discomfort or lower extremity swelling  Gastrointestinal:  Denies nausea, heartburn or change in bowel habits Lymphatics: Denies new lymphadenopathy or easy bruising Behavioral/Psych: Mood is stable, no new changes  All other systems were reviewed with the patient and are negative.  I have reviewed the past medical history, past surgical history, social history and family history with the patient and they are unchanged from previous note.  ALLERGIES:  is allergic to latex and sulfur.  MEDICATIONS:  Current Outpatient Medications  Medication Sig Dispense Refill  . albuterol (VENTOLIN HFA) 108 (90 Base) MCG/ACT inhaler Inhale 2 puffs into the lungs every 6 (six) hours as needed for wheezing or shortness of breath. 8 g 2  . cyclobenzaprine (FLEXERIL) 10 MG tablet Take 1 tablet (10 mg total) by mouth at bedtime as needed for muscle spasms. 15 tablet 0  . dexamethasone (DECADRON) 4 MG tablet Take 2 tabs at the night before and 2 tab the morning of chemotherapy, every 3 weeks, by mouth x 6 cycles 36 tablet 6  . famotidine (PEPCID) 20 MG tablet TAKE 1 TABLET BY MOUTH TWICE DAILY FOR HEART 180 tablet 1  . gabapentin (NEURONTIN) 300 MG capsule Take 1 capsule (300 mg total) by mouth 2 (two) times daily. 60 capsule 11  . hydrochlorothiazide (MICROZIDE) 12.5 MG capsule Take 1 capsule by mouth once daily 90 capsule 1  . HYDROmorphone (DILAUDID) 4 MG tablet Take 1 tablet (4 mg total) by mouth every 6 (six) hours as needed for severe pain. 30 tablet 0  . lidocaine-prilocaine (EMLA) cream Apply to affected area once 30 g 3  . losartan (COZAAR) 50 MG tablet Take 1 tablet by mouth once daily 90 tablet 1  . ondansetron (ZOFRAN) 8 MG tablet Take 1 tablet (8 mg total) by mouth every 8 (eight) hours as needed. 30 tablet 1  . prochlorperazine (COMPAZINE) 10 MG tablet Take 1  tablet (10 mg total) by mouth every 6 (six) hours as needed (Nausea or vomiting). 30 tablet 1   No current facility-administered medications for this visit.   Facility-Administered Medications Ordered in Other Visits  Medication Dose Route Frequency Provider Last Rate Last Admin  . CARBOplatin (PARAPLATIN) 450 mg in sodium chloride 0.9 % 250 mL chemo infusion  450 mg Intravenous Once Alvy Bimler, Darral Rishel, MD      . fosaprepitant (EMEND) 150 mg in sodium chloride 0.9 % 145 mL IVPB  150 mg Intravenous Once Heath Lark, MD 450 mL/hr at 08/21/20 0948 150 mg at 08/21/20 0948  . heparin lock flush 100 unit/mL  500 Units Intracatheter Once PRN Alvy Bimler, [place], MD      . insulin regular (NOVOLIN R) 100 units/mL injection 10 Units  10 Units Subcutaneous Once Alvy Bimler, Rie Mcneil, MD      . PACLitaxel (TAXOL) 180 mg in sodium chloride 0.9 % 250 mL chemo infusion (> 44m/m2)  87.5 mg/m2 (Treatment Plan Recorded) Intravenous Once Dniya Neuhaus, MD      . sodium chloride flush (NS) 0.9 % injection 10 mL  10 mL Intracatheter PRN GAlvy Bimler Natividad Schlosser, MD        PHYSICAL EXAMINATION: ECOG PERFORMANCE STATUS: 1 - Symptomatic but completely ambulatory  Vitals:   08/21/20 0824  BP: (!) 145/73  Pulse: 93  Resp: 18  Temp: (!) 97.1 F (36.2 C)  SpO2: 100%   Filed Weights   08/21/20 0824  Weight: 205 lb 9.6 oz (93.3 kg)    GENERAL:alert, no distress and comfortable SKIN: Noted acneiform facial rash EYES: normal, Conjunctiva are pink and non-injected, sclera clear OROPHARYNX:no  exudate, no erythema and lips, buccal mucosa, and tongue normal  NECK: supple, thyroid normal size, non-tender, without nodularity LYMPH:  no palpable lymphadenopathy in the cervical, axillary or inguinal LUNGS: clear to auscultation and percussion with normal breathing effort HEART: regular rate & rhythm and no murmurs and no lower extremity edema ABDOMEN:abdomen soft, non-tender and normal bowel sounds Musculoskeletal:no cyanosis of digits and no clubbing   NEURO: alert & oriented x 3 with fluent speech, no focal motor/sensory deficits  LABORATORY DATA:  I have reviewed the data as listed    Component Value Date/Time   NA 136 08/21/2020 0805   NA 142 04/07/2017 0810   K 3.6 08/21/2020 0805   K 3.8 04/07/2017 0810   CL 101 08/21/2020 0805   CO2 26 08/21/2020 0805   CO2 28 04/07/2017 0810   GLUCOSE 272 (H) 08/21/2020 0805   GLUCOSE 100 04/07/2017 0810   BUN 20 08/21/2020 0805   BUN 16.8 04/07/2017 0810   CREATININE 0.99 08/21/2020 0805   CREATININE 0.9 04/07/2017 0810   CALCIUM 10.1 08/21/2020 0805   CALCIUM 9.9 04/07/2017 0810   PROT 8.3 (H) 08/21/2020 0805   PROT 8.0 04/07/2017 0810   ALBUMIN 4.3 08/21/2020 0805   ALBUMIN 4.4 04/07/2017 0810   AST 18 08/21/2020 0805   AST 18 04/07/2017 0810   ALT 26 08/21/2020 0805   ALT 23 04/07/2017 0810   ALKPHOS 92 08/21/2020 0805   ALKPHOS 79 04/07/2017 0810   BILITOT 0.5 08/21/2020 0805   BILITOT 0.70 04/07/2017 0810   GFRNONAA >60 08/21/2020 0805   GFRAA >60 08/21/2020 0805    No results found for: SPEP, UPEP  Lab Results  Component Value Date   WBC 6.8 08/21/2020   NEUTROABS 5.2 08/21/2020   HGB 10.8 (L) 08/21/2020   HCT 32.9 (L) 08/21/2020   MCV 82.5 08/21/2020   PLT 70 (L) 08/21/2020      Chemistry      Component Value Date/Time   NA 136 08/21/2020 0805   NA 142 04/07/2017 0810   K 3.6 08/21/2020 0805   K 3.8 04/07/2017 0810   CL 101 08/21/2020 0805   CO2 26 08/21/2020 0805   CO2 28 04/07/2017 0810   BUN 20 08/21/2020 0805   BUN 16.8 04/07/2017 0810   CREATININE 0.99 08/21/2020 0805   CREATININE 0.9 04/07/2017 0810      Component Value Date/Time   CALCIUM 10.1 08/21/2020 0805   CALCIUM 9.9 04/07/2017 0810   ALKPHOS 92 08/21/2020 0805   ALKPHOS 79 04/07/2017 0810   AST 18 08/21/2020 0805   AST 18 04/07/2017 0810   ALT 26 08/21/2020 0805   ALT 23 04/07/2017 0810   BILITOT 0.5 08/21/2020 0805   BILITOT 0.70 04/07/2017 0810

## 2020-08-21 NOTE — Assessment & Plan Note (Signed)
Her low platelet count is likely due to her chronic ITP She is not symptomatic Observe for now

## 2020-08-21 NOTE — Assessment & Plan Note (Signed)
Her neuropathy is worse I plan to reduce the dose of Taxol further I also recommend trial of gabapentin she has mild peripheral neuropathy, likely related to side effects of treatment. I plan to reduce the dose of treatment as outlined above.  I explained to the patient the rationale of this strategy and reassured the patient it would not compromise the efficacy of treatment

## 2020-08-21 NOTE — Progress Notes (Signed)
Per MD carboplatin dose is 450mg  at this time.

## 2020-08-21 NOTE — Assessment & Plan Note (Signed)
This is likely precipitated by recent steroid As above, I plan to eliminate oral premed dexamethasone in the morning of chemo We also discussed conservative approach with over-the-counter facial wash

## 2020-08-21 NOTE — Assessment & Plan Note (Signed)
We discussed dietary modification I also plan to omit oral premedication dexamethasone next visit

## 2020-08-21 NOTE — Patient Instructions (Signed)
[facility] °Discharge Instructions for Patients Receiving Chemotherapy ° °Today you received the following chemotherapy agents Taxol; Carboplatin ° °To help prevent nausea and vomiting after your treatment, we encourage you to take your nausea medication as directed °  °If you develop nausea and vomiting that is not controlled by your nausea medication, call the clinic.  ° °BELOW ARE SYMPTOMS THAT SHOULD BE REPORTED IMMEDIATELY: °· *FEVER GREATER THAN 100.5 F °· *CHILLS WITH OR WITHOUT FEVER °· NAUSEA AND VOMITING THAT IS NOT CONTROLLED WITH YOUR NAUSEA MEDICATION °· *UNUSUAL SHORTNESS OF BREATH °· *UNUSUAL BRUISING OR BLEEDING °· TENDERNESS IN MOUTH AND THROAT WITH OR WITHOUT PRESENCE OF ULCERS °· *URINARY PROBLEMS °· *BOWEL PROBLEMS °· UNUSUAL RASH °Items with * indicate a potential emergency and should be followed up as soon as possible. ° °Feel free to call the clinic should you have any questions or concerns. The clinic phone number is [phone]. ° °Please show the CHEMO ALERT CARD at check-in to the Emergency Department and triage nurse. ° ° °

## 2020-09-02 ENCOUNTER — Encounter: Payer: Self-pay | Admitting: Hematology and Oncology

## 2020-09-11 ENCOUNTER — Encounter: Payer: Self-pay | Admitting: Hematology and Oncology

## 2020-09-11 ENCOUNTER — Other Ambulatory Visit: Payer: Self-pay | Admitting: Hematology and Oncology

## 2020-09-11 ENCOUNTER — Inpatient Hospital Stay: Payer: PRIVATE HEALTH INSURANCE

## 2020-09-11 ENCOUNTER — Other Ambulatory Visit: Payer: Self-pay

## 2020-09-11 ENCOUNTER — Inpatient Hospital Stay (HOSPITAL_BASED_OUTPATIENT_CLINIC_OR_DEPARTMENT_OTHER): Payer: PRIVATE HEALTH INSURANCE | Admitting: Hematology and Oncology

## 2020-09-11 VITALS — BP 144/80 | HR 100 | Temp 97.3°F | Resp 18 | Ht 63.0 in | Wt 207.2 lb

## 2020-09-11 DIAGNOSIS — D696 Thrombocytopenia, unspecified: Secondary | ICD-10-CM

## 2020-09-11 DIAGNOSIS — K769 Liver disease, unspecified: Secondary | ICD-10-CM

## 2020-09-11 DIAGNOSIS — C549 Malignant neoplasm of corpus uteri, unspecified: Secondary | ICD-10-CM

## 2020-09-11 DIAGNOSIS — Z5111 Encounter for antineoplastic chemotherapy: Secondary | ICD-10-CM | POA: Diagnosis not present

## 2020-09-11 DIAGNOSIS — D6481 Anemia due to antineoplastic chemotherapy: Secondary | ICD-10-CM | POA: Insufficient documentation

## 2020-09-11 DIAGNOSIS — Z23 Encounter for immunization: Secondary | ICD-10-CM

## 2020-09-11 DIAGNOSIS — T451X5A Adverse effect of antineoplastic and immunosuppressive drugs, initial encounter: Secondary | ICD-10-CM | POA: Insufficient documentation

## 2020-09-11 DIAGNOSIS — C55 Malignant neoplasm of uterus, part unspecified: Secondary | ICD-10-CM | POA: Diagnosis not present

## 2020-09-11 DIAGNOSIS — G620 Drug-induced polyneuropathy: Secondary | ICD-10-CM | POA: Diagnosis not present

## 2020-09-11 DIAGNOSIS — Z7189 Other specified counseling: Secondary | ICD-10-CM

## 2020-09-11 DIAGNOSIS — R531 Weakness: Secondary | ICD-10-CM

## 2020-09-11 DIAGNOSIS — T451X5D Adverse effect of antineoplastic and immunosuppressive drugs, subsequent encounter: Secondary | ICD-10-CM

## 2020-09-11 DIAGNOSIS — I10 Essential (primary) hypertension: Secondary | ICD-10-CM

## 2020-09-11 LAB — CMP (CANCER CENTER ONLY)
ALT: 15 U/L (ref 0–44)
AST: 15 U/L (ref 15–41)
Albumin: 4.4 g/dL (ref 3.5–5.0)
Alkaline Phosphatase: 89 U/L (ref 38–126)
Anion gap: 10 (ref 5–15)
BUN: 17 mg/dL (ref 6–20)
CO2: 26 mmol/L (ref 22–32)
Calcium: 10.3 mg/dL (ref 8.9–10.3)
Chloride: 104 mmol/L (ref 98–111)
Creatinine: 0.85 mg/dL (ref 0.44–1.00)
GFR, Estimated: >60 mL/min (ref >60)
Glucose, Bld: 187 mg/dL — ABNORMAL HIGH (ref 70–99)
Potassium: 3.6 mmol/L (ref 3.5–5.1)
Sodium: 140 mmol/L (ref 135–145)
Total Bilirubin: 0.6 mg/dL (ref 0.3–1.2)
Total Protein: 8.3 g/dL — ABNORMAL HIGH (ref 6.5–8.1)

## 2020-09-11 LAB — CBC WITH DIFFERENTIAL (CANCER CENTER ONLY)
Abs Immature Granulocytes: 0.04 10*3/uL (ref 0.00–0.07)
Basophils Absolute: 0 10*3/uL (ref 0.0–0.1)
Basophils Relative: 0 %
Eosinophils Absolute: 0 10*3/uL (ref 0.0–0.5)
Eosinophils Relative: 0 %
HCT: 32.5 % — ABNORMAL LOW (ref 36.0–46.0)
Hemoglobin: 10.5 g/dL — ABNORMAL LOW (ref 12.0–15.0)
Immature Granulocytes: 1 %
Lymphocytes Relative: 18 %
Lymphs Abs: 1.1 10*3/uL (ref 0.7–4.0)
MCH: 27.7 pg (ref 26.0–34.0)
MCHC: 32.3 g/dL (ref 30.0–36.0)
MCV: 85.8 fL (ref 80.0–100.0)
Monocytes Absolute: 0.1 10*3/uL (ref 0.1–1.0)
Monocytes Relative: 2 %
Neutro Abs: 5 10*3/uL (ref 1.7–7.7)
Neutrophils Relative %: 79 %
Platelet Count: 71 10*3/uL — ABNORMAL LOW (ref 150–400)
RBC: 3.79 MIL/uL — ABNORMAL LOW (ref 3.87–5.11)
RDW: 19.9 % — ABNORMAL HIGH (ref 11.5–15.5)
WBC Count: 6.3 10*3/uL (ref 4.0–10.5)
nRBC: 0 % (ref 0.0–0.2)

## 2020-09-11 MED ORDER — SODIUM CHLORIDE 0.9 % IV SOLN
87.5000 mg/m2 | Freq: Once | INTRAVENOUS | Status: AC
  Administered 2020-09-11: 180 mg via INTRAVENOUS
  Filled 2020-09-11: qty 30

## 2020-09-11 MED ORDER — SODIUM CHLORIDE 0.9 % IV SOLN
150.0000 mg | Freq: Once | INTRAVENOUS | Status: AC
  Administered 2020-09-11: 150 mg via INTRAVENOUS
  Filled 2020-09-11: qty 150

## 2020-09-11 MED ORDER — DIPHENHYDRAMINE HCL 50 MG/ML IJ SOLN
INTRAMUSCULAR | Status: AC
  Filled 2020-09-11: qty 1

## 2020-09-11 MED ORDER — INFLUENZA VAC SPLIT QUAD 0.5 ML IM SUSY
0.5000 mL | PREFILLED_SYRINGE | Freq: Once | INTRAMUSCULAR | Status: AC
  Administered 2020-09-11: 0.5 mL via INTRAMUSCULAR

## 2020-09-11 MED ORDER — BLOOD PRESSURE MONITOR AUTOMAT DEVI: 0 refills | Status: DC

## 2020-09-11 MED ORDER — PALONOSETRON HCL INJECTION 0.25 MG/5ML
INTRAVENOUS | Status: AC
  Filled 2020-09-11: qty 5

## 2020-09-11 MED ORDER — INFLUENZA VAC SPLIT QUAD 0.5 ML IM SUSY
PREFILLED_SYRINGE | INTRAMUSCULAR | Status: AC
  Filled 2020-09-11: qty 0.5

## 2020-09-11 MED ORDER — FAMOTIDINE IN NACL 20-0.9 MG/50ML-% IV SOLN
20.0000 mg | Freq: Once | INTRAVENOUS | Status: AC
  Administered 2020-09-11: 20 mg via INTRAVENOUS

## 2020-09-11 MED ORDER — SODIUM CHLORIDE 0.9% FLUSH
10.0000 mL | INTRAVENOUS | Status: DC | PRN
  Administered 2020-09-11: 10 mL
  Filled 2020-09-11: qty 10

## 2020-09-11 MED ORDER — FAMOTIDINE IN NACL 20-0.9 MG/50ML-% IV SOLN
INTRAVENOUS | Status: AC
  Filled 2020-09-11: qty 50

## 2020-09-11 MED ORDER — PALONOSETRON HCL INJECTION 0.25 MG/5ML
0.2500 mg | Freq: Once | INTRAVENOUS | Status: AC
  Administered 2020-09-11: 0.25 mg via INTRAVENOUS

## 2020-09-11 MED ORDER — HEPARIN SOD (PORK) LOCK FLUSH 100 UNIT/ML IV SOLN
500.0000 [IU] | Freq: Once | INTRAVENOUS | Status: AC | PRN
  Administered 2020-09-11: 500 [IU]
  Filled 2020-09-11: qty 5

## 2020-09-11 MED ORDER — SODIUM CHLORIDE 0.9% FLUSH
10.0000 mL | Freq: Once | INTRAVENOUS | Status: AC
  Administered 2020-09-11: 10 mL
  Filled 2020-09-11: qty 10

## 2020-09-11 MED ORDER — DIPHENHYDRAMINE HCL 50 MG/ML IJ SOLN
50.0000 mg | Freq: Once | INTRAMUSCULAR | Status: AC
  Administered 2020-09-11: 50 mg via INTRAVENOUS

## 2020-09-11 MED ORDER — SODIUM CHLORIDE 0.9 % IV SOLN
450.0000 mg | Freq: Once | INTRAVENOUS | Status: AC
  Administered 2020-09-11: 450 mg via INTRAVENOUS
  Filled 2020-09-11: qty 45

## 2020-09-11 MED ORDER — SODIUM CHLORIDE 0.9 % IV SOLN
10.0000 mg | Freq: Once | INTRAVENOUS | Status: AC
  Administered 2020-09-11: 10 mg via INTRAVENOUS
  Filled 2020-09-11: qty 10

## 2020-09-11 MED ORDER — SODIUM CHLORIDE 0.9% FLUSH
10.0000 mL | Freq: Once | INTRAVENOUS | Status: DC
  Filled 2020-09-11: qty 10

## 2020-09-11 MED ORDER — SODIUM CHLORIDE 0.9 % IV SOLN
Freq: Once | INTRAVENOUS | Status: AC
  Filled 2020-09-11: qty 250

## 2020-09-11 NOTE — Assessment & Plan Note (Signed)
We will continue with reduced dose chemotherapy as scheduled She will continue gabapentin

## 2020-09-11 NOTE — Assessment & Plan Note (Signed)
This is likely due to recent treatment. The patient denies recent history of bleeding such as epistaxis, hematuria or hematochezia. She is asymptomatic from the anemia. I will observe for now.   

## 2020-09-11 NOTE — Assessment & Plan Note (Signed)
We discussed the importance of preventive care and reviewed the vaccination programs. She does not have any prior allergic reactions to influenza vaccination. She agrees to proceed with influenza vaccination today and we will administer it today at the clinic.  

## 2020-09-11 NOTE — Patient Instructions (Signed)

## 2020-09-11 NOTE — Progress Notes (Signed)
[facility] OFFICE PROGRESS NOTE  Patient Care Team: [name], MD as PCP - General (Family Medicine) Buford Dresser, MD as PCP - Cardiology (Cardiology) Awanda Mink Craige Cotta, RN as Oncology Nurse Navigator (Oncology)  ASSESSMENT & PLAN:  Adenosarcoma of body of uterus [facility]) Her recent Ct showed soft tissue thickening near the vagina area could represent postop changes, and I plan to repeat next month She is not symptomatic She tolerated reduced dose chemotherapy better, will continue with similar dose adjustment We will proceed with treatment without delay  Peripheral neuropathy due to chemotherapy [facility]) We will continue with reduced dose chemotherapy as scheduled She will continue gabapentin  Preventive measure We discussed the importance of preventive care and reviewed the vaccination programs. She does not have any prior allergic reactions to influenza vaccination. She agrees to proceed with influenza vaccination today and we will administer it today at the clinic.   Thrombocytopenia (HCC) Her low platelet count is likely due to her chronic ITP She is not symptomatic Observe for now  Anemia due to antineoplastic chemotherapy This is likely due to recent treatment. The patient denies recent history of bleeding such as epistaxis, hematuria or hematochezia. She is asymptomatic from the anemia. I will observe for now.   Generalized weakness This is stable and not worse She would like to try to wear intermittently from home next month I gave her a letter to give to her supervisor to allow her to return back to work next month.   Orders Placed This Encounter  Procedures  . CT ABDOMEN PELVIS W CONTRAST    Standing Status:   Future    Standing Expiration Date:   09/11/2021    Order Specific Question:   If indicated for the ordered procedure, I authorize the administration of contrast media per Radiology protocol    Answer:   Yes    Order Specific Question:    Preferred imaging location?    Answer:   [facility]    Order Specific Question:   Radiology Contrast Protocol - do NOT remove file path    Answer:   [email or web address]    Order Specific Question:   Is patient pregnant?    Answer:   No    All questions were answered. The patient knows to call the clinic with any problems, questions or concerns. The total time spent in the appointment was 30 minutes encounter with patients including review of chart and various tests results, discussions about plan of care and coordination of care plan   Heath Lark, MD 09/11/2020 9:51 AM  INTERVAL HISTORY: Please see below for problem oriented charting. She returns for further follow-up and for final treatment Neuropathy stable She had some mild nausea but not to the point she needs to take antiemetics No recent constipation She has very mild bone pain She has questions about the plan to return back to work  SUMMARY OF ONCOLOGIC HISTORY: Oncology History Overview Note  MSI stable   Adenosarcoma of body of uterus ([place])  03/03/2020 Initial Diagnosis   She developed PMB in early April after going through menopause at approximately age 4. She saw her PCP on 4/13 for bleeding and an ultrasound was obtained showing a thickened endometrial lining and 3-4cm mass within the endocervical canal. She was referred to [facility], started on Megace and ultimately underwent a TRH/BSO on 5/18 for abnormal uterine bleeding with resulting anemia, large prolapsing fibroid.    03/06/2020 Imaging   US pelvis Markedly thickened  heterogeneous, and hypervascular endometrial complex up to 38 mm thick highly concerning for endometrial neoplasm.   Additional more focal 3.0 x 2.8 x 5.3 cm diameter mass within endocervical canal, may represent extension of above endometrial pathology versus separate endocervical tumor.   Tissue diagnosis recommended.   Nonvisualization of ovaries.    04/07/2020 Pathology Results   A. UTERUS, CERVIX AND BILATERAL FALLOPIAN TUBES AND OVARIES, HYSTERECTOMY AND BILATERAL SALPINGO-OOPHORECTOMY: - Mullerian adenosarcoma of endometrium. - Tumor limited to the uterus. - Tumor invades to less than half of the myometrium. - No involvement of adnexa. - Margins of resection are not involved. - See oncology table and comment. ONCOLOGY TABLE: UTERUS, SARCOMA: Procedure: Total hysterectomy and bilateral salpingo-oophorectomy Specimen Integrity: The uterus, cervix and bilateral fallopian tubes and ovaries were received intact. The nodule clinically identified as vaginally prolapsed uterine fibroid was received separate and disrupted. Tumor Size: Greatest dimension: 7.5 cm Histologic Type: Mullerian adenosarcoma with overgrowth of the epithelial and stromal components Histologic Grade: The epithelial component has proliferated to form endometrioid adenocarcinoma which ranges from low grade to high grade. The stromal component focally shows sarcomatous overgrowth. Myometrial Invasion: Present Depth of invasion: 4 mm Myometrial thickness: 30 mm Other Tissue/Organ Involvement: Not identified Margins: Uninvolved by sarcoma Lymphovascular Invasion: Not identified Regional Lymph Nodes: No lymph nodes submitted or found Pathologic Stage Classification (pTNM, AJCC 8th Edition): pT1b, pNX Additional Pathologic Findings: Adenomyosis. Leiomyomata.   04/07/2020 Surgery   PRE-OPERATIVE DIAGNOSIS:  Large prolapsed fibroid vaginally, menometrorrhagia with anemia   POST-OPERATIVE DIAGNOSIS:  Large prolapsed fibroid vaginally, menometrorrhagia with anemia.  Omental adhesions with anterior abdominal wall.   PROCEDURE:  Procedure(s): XI ROBOTIC TOTAL LAPAROSCOPIC HYSTERECTOMY, BILATERAL SALPINGO OOPHORECTOMY/LYSIS OF ADHESIONS/VAGINAL MYOMECTOMY    FINDINGS: Large necrotic vaginally prolapsed uterine myoma.  Uterus with small subserosal/intramural myoma.   Bilateral tubes post tubal ligation.  Bilateral normal ovaries.  Omental adhesions with anterior abdominal wall.   05/01/2020 Imaging   Ct scan of chest, abdomen and pelvis 1. Multiple ill-defined low density liver masses, suspicious for metastases. These could be further characterized with pre and postcontrast magnetic resonance imaging of the liver. 2. Moderate diffuse vaginal wall thickening, possibly representing edema associated with the patient's recent surgery. 3. Small hiatal hernia. 4. Minimal coronary artery atheromatous calcifications. 5. Cholelithiasis. 6. Tiny rounded area of low density in the posterior spleen, too small to characterize.   05/11/2020 Cancer Staging   Staging form: Corpus Uteri - Adenosarcoma, AJCC 8th Edition - Pathologic: Stage IB (pT1b, pN0, cM0) - Signed by Heath Lark, MD on 05/15/2020   05/14/2020 Imaging   MRI liver 1. The 2 dominant right hepatic lobe lesions have MR imaging features most consistent with benign cavernous hemangioma. The remaining tiny liver lesions are too small to characterize. Statistically, while these are likely benign, follow-up MRI in 3 months recommended to ensure stability. 2. No other findings to suggest metastatic disease.   05/22/2020 Procedure   Placement of a subcutaneous port device. Catheter tip at the superior cavoatrial junction   05/29/2020 -  Chemotherapy   The patient had carboplatin and taxol for chemotherapy treatment.     07/22/2020 Imaging   1. Status post hysterectomy and bilateral oophorectomy. Irregular soft tissue density in the region of the vaginal cuff is relatively similar to on the prior exam. Although this could represent developing scar or chronic hematoma, residual disease cannot be excluded. This could either be re-evaluated at follow-up or if a more aggressive approach is desired, characterized with PET. 2.  No other evidence of metastatic disease in the abdomen or pelvis. 3. Hepatic hemangiomas, as  before. Smaller liver lesions are unchanged, favoring a benign etiology. 4. Cholelithiasis. 5. Tiny hiatal hernia. 6. Aortic Atherosclerosis (ICD10-I70.0).     REVIEW OF SYSTEMS:   Constitutional: Denies fevers, chills or abnormal weight loss Eyes: Denies blurriness of vision Ears, nose, mouth, throat, and face: Denies mucositis or sore throat Respiratory: Denies cough, dyspnea or wheezes Cardiovascular: Denies palpitation, chest discomfort or lower extremity swelling Skin: Denies abnormal skin rashes Lymphatics: Denies new lymphadenopathy or easy bruising NBehavioral/Psych: Mood is stable, no new changes  All other systems were reviewed with the patient and are negative.  I have reviewed the past medical history, past surgical history, social history and family history with the patient and they are unchanged from previous note.  ALLERGIES:  is allergic to latex and sulfur.  MEDICATIONS:  Current Outpatient Medications  Medication Sig Dispense Refill  . albuterol (VENTOLIN HFA) 108 (90 Base) MCG/ACT inhaler Inhale 2 puffs into the lungs every 6 (six) hours as needed for wheezing or shortness of breath. 8 g 2  . cyclobenzaprine (FLEXERIL) 10 MG tablet Take 1 tablet (10 mg total) by mouth at bedtime as needed for muscle spasms. 15 tablet 0  . famotidine (PEPCID) 20 MG tablet TAKE 1 TABLET BY MOUTH TWICE DAILY FOR HEART 180 tablet 1  . gabapentin (NEURONTIN) 300 MG capsule Take 1 capsule (300 mg total) by mouth 2 (two) times daily. 60 capsule 11  . hydrochlorothiazide (MICROZIDE) 12.5 MG capsule Take 1 capsule by mouth once daily 90 capsule 1  . HYDROmorphone (DILAUDID) 4 MG tablet Take 1 tablet (4 mg total) by mouth every 6 (six) hours as needed for severe pain. 30 tablet 0  . lidocaine-prilocaine (EMLA) cream Apply to affected area once 30 g 3  . losartan (COZAAR) 50 MG tablet Take 1 tablet by mouth once daily 90 tablet 1  . ondansetron (ZOFRAN) 8 MG tablet Take 1 tablet (8 mg total)  by mouth every 8 (eight) hours as needed. 30 tablet 1  . prochlorperazine (COMPAZINE) 10 MG tablet Take 1 tablet (10 mg total) by mouth every 6 (six) hours as needed (Nausea or vomiting). 30 tablet 1   No current facility-administered medications for this visit.   Facility-Administered Medications Ordered in Other Visits  Medication Dose Route Frequency Provider Last Rate Last Admin  . influenza vac split quadrivalent PF (FLUARIX) injection 0.5 mL  0.5 mL Intramuscular Once Alvy Bimler, Ramari Bray, MD        PHYSICAL EXAMINATION: ECOG PERFORMANCE STATUS: 1 - Symptomatic but completely ambulatory  Vitals:   09/11/20 0925  BP: (!) 144/80  Pulse: 100  Resp: 18  Temp: (!) 97.3 F (36.3 C)  SpO2: 100%   Filed Weights   09/11/20 0925  Weight: 207 lb 3.2 oz (94 kg)    GENERAL:alert, no distress and comfortable NEURO: alert & oriented x 3 with fluent speech, no focal motor/sensory deficits  LABORATORY DATA:  I have reviewed the data as listed    Component Value Date/Time   NA 136 08/21/2020 0805   NA 142 04/07/2017 0810   K 3.6 08/21/2020 0805   K 3.8 04/07/2017 0810   CL 101 08/21/2020 0805   CO2 26 08/21/2020 0805   CO2 28 04/07/2017 0810   GLUCOSE 272 (H) 08/21/2020 0805   GLUCOSE 100 04/07/2017 0810   BUN 20 08/21/2020 0805   BUN 16.8 04/07/2017 0810   CREATININE 0.99 08/21/2020  0805   CREATININE 0.9 04/07/2017 0810   CALCIUM 10.1 08/21/2020 0805   CALCIUM 9.9 04/07/2017 0810   PROT 8.3 (H) 08/21/2020 0805   PROT 8.0 04/07/2017 0810   ALBUMIN 4.3 08/21/2020 0805   ALBUMIN 4.4 04/07/2017 0810   AST 18 08/21/2020 0805   AST 18 04/07/2017 0810   ALT 26 08/21/2020 0805   ALT 23 04/07/2017 0810   ALKPHOS 92 08/21/2020 0805   ALKPHOS 79 04/07/2017 0810   BILITOT 0.5 08/21/2020 0805   BILITOT 0.70 04/07/2017 0810   GFRNONAA >60 08/21/2020 0805   GFRAA >60 08/21/2020 0805    No results found for: SPEP, UPEP  Lab Results  Component Value Date   WBC 6.3 09/11/2020    NEUTROABS 5.0 09/11/2020   HGB 10.5 (L) 09/11/2020   HCT 32.5 (L) 09/11/2020   MCV 85.8 09/11/2020   PLT 71 (L) 09/11/2020      Chemistry      Component Value Date/Time   NA 136 08/21/2020 0805   NA 142 04/07/2017 0810   K 3.6 08/21/2020 0805   K 3.8 04/07/2017 0810   CL 101 08/21/2020 0805   CO2 26 08/21/2020 0805   CO2 28 04/07/2017 0810   BUN 20 08/21/2020 0805   BUN 16.8 04/07/2017 0810   CREATININE 0.99 08/21/2020 0805   CREATININE 0.9 04/07/2017 0810      Component Value Date/Time   CALCIUM 10.1 08/21/2020 0805   CALCIUM 9.9 04/07/2017 0810   ALKPHOS 92 08/21/2020 0805   ALKPHOS 79 04/07/2017 0810   AST 18 08/21/2020 0805   AST 18 04/07/2017 0810   ALT 26 08/21/2020 0805   ALT 23 04/07/2017 0810   BILITOT 0.5 08/21/2020 0805   BILITOT 0.70 04/07/2017 0810

## 2020-09-11 NOTE — Assessment & Plan Note (Signed)
Her low platelet count is likely due to her chronic ITP She is not symptomatic Observe for now

## 2020-09-11 NOTE — Patient Instructions (Signed)
[facility] Discharge Instructions for Patients Receiving Chemotherapy  Today you received the following chemotherapy agents: taxol, carboplatin   To help prevent nausea and vomiting after your treatment, we encourage you to take your nausea medication as directed.    If you develop nausea and vomiting that is not controlled by your nausea medication, call the clinic.   BELOW ARE SYMPTOMS THAT SHOULD BE REPORTED IMMEDIATELY:  *FEVER GREATER THAN 100.5 F  *CHILLS WITH OR WITHOUT FEVER  NAUSEA AND VOMITING THAT IS NOT CONTROLLED WITH YOUR NAUSEA MEDICATION  *UNUSUAL SHORTNESS OF BREATH  *UNUSUAL BRUISING OR BLEEDING  TENDERNESS IN MOUTH AND THROAT WITH OR WITHOUT PRESENCE OF ULCERS  *URINARY PROBLEMS  *BOWEL PROBLEMS  UNUSUAL RASH Items with * indicate a potential emergency and should be followed up as soon as possible.  Feel free to call the clinic should you have any questions or concerns. The clinic phone number is [phone].  Please show the CHEMO ALERT CARD at check-in to the Emergency Department and triage nurse.   

## 2020-09-11 NOTE — Assessment & Plan Note (Signed)
This is stable and not worse She would like to try to wear intermittently from home next month I gave her a letter to give to her supervisor to allow her to return back to work next month.

## 2020-09-11 NOTE — Assessment & Plan Note (Signed)
Her recent Ct showed soft tissue thickening near the vagina area could represent postop changes, and I plan to repeat next month She is not symptomatic She tolerated reduced dose chemotherapy better, will continue with similar dose adjustment We will proceed with treatment without delay

## 2020-09-21 ENCOUNTER — Encounter: Payer: Self-pay | Admitting: Hematology and Oncology

## 2020-09-25 ENCOUNTER — Ambulatory Visit (INDEPENDENT_AMBULATORY_CARE_PROVIDER_SITE_OTHER): Payer: PRIVATE HEALTH INSURANCE | Admitting: Family Medicine

## 2020-09-25 ENCOUNTER — Encounter: Payer: Self-pay | Admitting: Family Medicine

## 2020-09-25 ENCOUNTER — Other Ambulatory Visit: Payer: Self-pay

## 2020-09-25 VITALS — BP 120/72 | HR 61 | Temp 97.5°F | Ht 62.99 in | Wt 209.5 lb

## 2020-09-25 DIAGNOSIS — H6982 Other specified disorders of Eustachian tube, left ear: Secondary | ICD-10-CM | POA: Insufficient documentation

## 2020-09-25 DIAGNOSIS — D84821 Immunodeficiency due to drugs: Secondary | ICD-10-CM

## 2020-09-25 DIAGNOSIS — Z79899 Other long term (current) drug therapy: Secondary | ICD-10-CM | POA: Insufficient documentation

## 2020-09-25 MED ORDER — PREDNISONE 20 MG PO TABS: 20.0000 mg | ORAL_TABLET | Freq: Every day | ORAL | 0 refills | Status: DC

## 2020-09-25 NOTE — Assessment & Plan Note (Signed)
No sign of ear infection.  Allergy symptoms present and ETD in left era. Treat with prednisone taper followed by flonase. Start antihistamine and mucinex.

## 2020-09-25 NOTE — Patient Instructions (Addendum)
Continue mucinex twice daily.  Complete prednisone course, follow with flonase OTC 2 sprays per nostril daily.  Start Zyrtec at bedtime.  Stop tylenol sinus if not helping.

## 2020-09-25 NOTE — Progress Notes (Signed)
Chief Complaint  Patient presents with   Ear Problem    pt c/o left ear pain. States they feel they are stopped up. echoing and muffled hearing.     History of Present Illness: HPI  60 year old female present with new onset  left ear pain... ongoing for 3 weeks.  decreased hearing and fullness sin left ear.  Grinding in left ear.  No discharge.  no cough, congestion.  Post nasal drip at night.. she has been taking tylenol sinus and mucinex.  Has allergies this time of year.  no fever.   Last chemo treatment on 09/11/2020   This visit occurred during the SARS-CoV-2 public health emergency.  Safety protocols were in place, including screening questions prior to the visit, additional usage of staff PPE, and extensive cleaning of exam room while observing appropriate contact time as indicated for disinfecting solutions.   COVID 19 screen:  No recent travel or known exposure to COVID19 The patient denies respiratory symptoms of COVID 19 at this time. The importance of social distancing was discussed today.     Review of Systems  Constitutional: Negative for chills and fever.  HENT: Positive for congestion and ear pain.   Eyes: Negative for pain and redness.  Respiratory: Negative for cough and shortness of breath.   Cardiovascular: Negative for chest pain, palpitations and leg swelling.  Gastrointestinal: Negative for abdominal pain, blood in stool, constipation, diarrhea, nausea and vomiting.  Genitourinary: Negative for dysuria.  Musculoskeletal: Negative for falls and myalgias.  Skin: Negative for rash.  Neurological: Negative for dizziness.  Psychiatric/Behavioral: Negative for depression. The patient is not nervous/anxious.       Past Medical History:  Diagnosis Date   Allergy    occasionally   Anemia    yrs ago, low platelets   Arthritis    RA   Blood transfusion without reported diagnosis    1985 with c section   Borderline diabetic    Dyspnea     While taking Megace   GERD (gastroesophageal reflux disease)    Hypertension    Obstructive sleep apnea syndrome 01/07/2020   Thrombocytopenia ([place])     reports that she has quit smoking. Her smoking use included cigarettes. She has a 3.00 pack-year smoking history. She has never used smokeless tobacco. She reports current alcohol use. She reports that she does not use drugs.   Current Outpatient Medications:    albuterol (VENTOLIN HFA) 108 (90 Base) MCG/ACT inhaler, Inhale 2 puffs into the lungs every 6 (six) hours as needed for wheezing or shortness of breath., Disp: 8 g, Rfl: 2   Blood Pressure Monitoring (BLOOD PRESSURE MONITOR AUTOMAT) DEVI, Use to check Blood pressures daily, Disp: 1 each, Rfl: 0   cyclobenzaprine (FLEXERIL) 10 MG tablet, Take 1 tablet (10 mg total) by mouth at bedtime as needed for muscle spasms., Disp: 15 tablet, Rfl: 0   famotidine (PEPCID) 20 MG tablet, TAKE 1 TABLET BY MOUTH TWICE DAILY FOR HEART, Disp: 180 tablet, Rfl: 1   gabapentin (NEURONTIN) 300 MG capsule, Take 1 capsule (300 mg total) by mouth 2 (two) times daily., Disp: 60 capsule, Rfl: 11   hydrochlorothiazide (MICROZIDE) 12.5 MG capsule, Take 1 capsule by mouth once daily, Disp: 90 capsule, Rfl: 1   HYDROmorphone (DILAUDID) 4 MG tablet, Take 1 tablet (4 mg total) by mouth every 6 (six) hours as needed for severe pain., Disp: 30 tablet, Rfl: 0   lidocaine-prilocaine (EMLA) cream, Apply to affected area once, Disp: 30  g, Rfl: 3   losartan (COZAAR) 50 MG tablet, Take 1 tablet by mouth once daily, Disp: 90 tablet, Rfl: 1   ondansetron (ZOFRAN) 8 MG tablet, Take 1 tablet (8 mg total) by mouth every 8 (eight) hours as needed., Disp: 30 tablet, Rfl: 1   prochlorperazine (COMPAZINE) 10 MG tablet, Take 1 tablet (10 mg total) by mouth every 6 (six) hours as needed (Nausea or vomiting)., Disp: 30 tablet, Rfl: 1   ibuprofen (ADVIL) 800 MG tablet, Take 800 mg by mouth 3 (three) times daily., Disp: , Rfl:     Observations/Objective: Blood pressure 120/72, pulse 61, temperature (!) 97.5 F (36.4 C), height 5' 2.99" (1.6 m), weight 209 lb 8 oz (95 kg), last menstrual period 11/17/2012, SpO2 100 %.  Physical Exam Constitutional:      General: She is not in acute distress.    Appearance: Normal appearance. She is well-developed. She is not ill-appearing or toxic-appearing.  HENT:     Head: Normocephalic.     Right Ear: Hearing, tympanic membrane, ear canal and external ear normal. No middle ear effusion. Tympanic membrane is not injected, erythematous, retracted or bulging.     Left Ear: Hearing, ear canal and external ear normal. A middle ear effusion is present. Tympanic membrane is not injected, erythematous, retracted or bulging.     Nose: No mucosal edema or rhinorrhea.     Right Sinus: No maxillary sinus tenderness or frontal sinus tenderness.     Left Sinus: No maxillary sinus tenderness or frontal sinus tenderness.     Mouth/Throat:     Pharynx: Uvula midline.  Eyes:     General: Lids are normal. Lids are everted, no foreign bodies appreciated.     Conjunctiva/sclera: Conjunctivae normal.     Pupils: Pupils are equal, round, and reactive to light.  Neck:     Thyroid: No thyroid mass or thyromegaly.     Vascular: No carotid bruit.     Trachea: Trachea normal.  Cardiovascular:     Rate and Rhythm: Normal rate and regular rhythm.     Pulses: Normal pulses.     Heart sounds: Normal heart sounds, S1 normal and S2 normal. No murmur heard.  No friction rub. No gallop.   Pulmonary:     Effort: Pulmonary effort is normal. No tachypnea or respiratory distress.     Breath sounds: Normal breath sounds. No decreased breath sounds, wheezing, rhonchi or rales.  Abdominal:     General: Bowel sounds are normal.     Palpations: Abdomen is soft.     Tenderness: There is no abdominal tenderness.  Musculoskeletal:     Cervical back: Normal range of motion and neck supple.  Skin:    General:  Skin is warm and dry.     Findings: No rash.  Neurological:     Mental Status: She is alert.  Psychiatric:        Mood and Affect: Mood is not anxious or depressed.        Speech: Speech normal.        Behavior: Behavior normal. Behavior is cooperative.        Thought Content: Thought content normal.        Judgment: Judgment normal.      Assessment and Plan ETD (Eustachian tube dysfunction), left No sign of ear infection.  Allergy symptoms present and ETD in left era. Treat with prednisone taper followed by flonase. Start antihistamine and mucinex.  Immunocompromised state due to drug therapy (  [place])  Just recently finished chemo.. likely remains immunocompromised.       Eliezer Lofts, MD

## 2020-09-25 NOTE — Assessment & Plan Note (Signed)
Just recently finished chemo.. likely remains immunocompromised.

## 2020-09-27 ENCOUNTER — Encounter: Payer: Self-pay | Admitting: Hematology and Oncology

## 2020-09-28 ENCOUNTER — Telehealth: Payer: Self-pay

## 2020-09-28 NOTE — Telephone Encounter (Signed)
Called and left below message. Ask her to call the office back. 

## 2020-09-28 NOTE — Telephone Encounter (Signed)
Called back and given below message. She would like appt on 11/11 at 1230. She said thank you.

## 2020-09-28 NOTE — Telephone Encounter (Signed)
-----   Message from Heath Lark, MD sent at 09/28/2020  9:24 AM EST ----- Regarding: FMLA I can see her Thrusday from 1230 to 1 pm Please call if ok schedule the appt

## 2020-10-01 ENCOUNTER — Encounter: Payer: Self-pay | Admitting: Hematology and Oncology

## 2020-10-01 ENCOUNTER — Other Ambulatory Visit: Payer: Self-pay

## 2020-10-01 ENCOUNTER — Inpatient Hospital Stay: Payer: PRIVATE HEALTH INSURANCE | Attending: Gynecologic Oncology | Admitting: Hematology and Oncology

## 2020-10-01 DIAGNOSIS — R739 Hyperglycemia, unspecified: Secondary | ICD-10-CM | POA: Insufficient documentation

## 2020-10-01 DIAGNOSIS — Z79818 Long term (current) use of other agents affecting estrogen receptors and estrogen levels: Secondary | ICD-10-CM | POA: Insufficient documentation

## 2020-10-01 DIAGNOSIS — C55 Malignant neoplasm of uterus, part unspecified: Secondary | ICD-10-CM | POA: Diagnosis not present

## 2020-10-01 DIAGNOSIS — G620 Drug-induced polyneuropathy: Secondary | ICD-10-CM | POA: Insufficient documentation

## 2020-10-01 DIAGNOSIS — R531 Weakness: Secondary | ICD-10-CM | POA: Diagnosis not present

## 2020-10-01 DIAGNOSIS — R21 Rash and other nonspecific skin eruption: Secondary | ICD-10-CM | POA: Diagnosis not present

## 2020-10-01 DIAGNOSIS — D61818 Other pancytopenia: Secondary | ICD-10-CM | POA: Diagnosis not present

## 2020-10-01 DIAGNOSIS — Z90722 Acquired absence of ovaries, bilateral: Secondary | ICD-10-CM | POA: Diagnosis not present

## 2020-10-01 DIAGNOSIS — Z90710 Acquired absence of both cervix and uterus: Secondary | ICD-10-CM | POA: Diagnosis not present

## 2020-10-01 DIAGNOSIS — R5383 Other fatigue: Secondary | ICD-10-CM | POA: Diagnosis not present

## 2020-10-01 DIAGNOSIS — C549 Malignant neoplasm of corpus uteri, unspecified: Secondary | ICD-10-CM

## 2020-10-01 DIAGNOSIS — L709 Acne, unspecified: Secondary | ICD-10-CM

## 2020-10-01 NOTE — Assessment & Plan Note (Signed)
She has generalized weakness, shortness of breath on minimal exertion, reduced stamina and excessive fatigue, all due to residual side effects of treatment She is not able to return back to work I spent some time completing application for extension of disability for at least 1 more month I will see her again for further follow-up next month to determine whether she is strong enough to return back to work or not

## 2020-10-01 NOTE — Assessment & Plan Note (Signed)
She has mild skin breakout likely secondary to steroids Observe only for now

## 2020-10-01 NOTE — Progress Notes (Signed)
[facility] OFFICE PROGRESS NOTE  Patient Care Team: [name], MD as PCP - General (Family Medicine) Buford Dresser, MD as PCP - Cardiology (Cardiology) Awanda Mink Craige Cotta, RN as Oncology Nurse Navigator (Oncology)  ASSESSMENT & PLAN:  Adenosarcoma of body of uterus [facility]) Unfortunately, she is still experiencing multiple side effects from treatment She is not able to return back to work I will see her again at the end of the month for further follow-up to review imaging test results  Acne She has mild skin breakout likely secondary to steroids Observe only for now  Generalized weakness She has generalized weakness, shortness of breath on minimal exertion, reduced stamina and excessive fatigue, all due to residual side effects of treatment She is not able to return back to work I spent some time completing application for extension of disability for at least 1 more month I will see her again for further follow-up next month to determine whether she is strong enough to return back to work or not   No orders of the defined types were placed in this encounter.   All questions were answered. The patient knows to call the clinic with any problems, questions or concerns. The total time spent in the appointment was 20 minutes encounter with patients including review of chart and various tests results, discussions about plan of care and coordination of care plan   Heath Lark, MD 10/01/2020 12:58 PM  INTERVAL HISTORY: Please see below for problem oriented charting. She is seen for further follow-up She was noted to have some eustachian tube dysfunction and was prescribed oral prednisone She had minor skin breakout since then She has excessive fatigue, reduced stamina, generalized weakness and shortness of breath on minimal exertion since last time I saw her She is not able to return back to work and requests extension for disability until at least 1 more  month  SUMMARY OF ONCOLOGIC HISTORY: Oncology History Overview Note  MSI stable   Adenosarcoma of body of uterus ([place])  03/03/2020 Initial Diagnosis   She developed PMB in early April after going through menopause at approximately age 59. She saw her PCP on 4/13 for bleeding and an ultrasound was obtained showing a thickened endometrial lining and 3-4cm mass within the endocervical canal. She was referred to [facility], started on Megace and ultimately underwent a TRH/BSO on 5/18 for abnormal uterine bleeding with resulting anemia, large prolapsing fibroid.    03/06/2020 Imaging   US pelvis Markedly thickened heterogeneous, and hypervascular endometrial complex up to 38 mm thick highly concerning for endometrial neoplasm.   Additional more focal 3.0 x 2.8 x 5.3 cm diameter mass within endocervical canal, may represent extension of above endometrial pathology versus separate endocervical tumor.   Tissue diagnosis recommended.   Nonvisualization of ovaries.   04/07/2020 Pathology Results   A. UTERUS, CERVIX AND BILATERAL FALLOPIAN TUBES AND OVARIES, HYSTERECTOMY AND BILATERAL SALPINGO-OOPHORECTOMY: - Mullerian adenosarcoma of endometrium. - Tumor limited to the uterus. - Tumor invades to less than half of the myometrium. - No involvement of adnexa. - Margins of resection are not involved. - See oncology table and comment. ONCOLOGY TABLE: UTERUS, SARCOMA: Procedure: Total hysterectomy and bilateral salpingo-oophorectomy Specimen Integrity: The uterus, cervix and bilateral fallopian tubes and ovaries were received intact. The nodule clinically identified as vaginally prolapsed uterine fibroid was received separate and disrupted. Tumor Size: Greatest dimension: 7.5 cm Histologic Type: Mullerian adenosarcoma with overgrowth of the epithelial and stromal components Histologic Grade: The epithelial component  has proliferated to form endometrioid adenocarcinoma which ranges from low grade to high  grade. The stromal component focally shows sarcomatous overgrowth. Myometrial Invasion: Present Depth of invasion: 4 mm Myometrial thickness: 30 mm Other Tissue/Organ Involvement: Not identified Margins: Uninvolved by sarcoma Lymphovascular Invasion: Not identified Regional Lymph Nodes: No lymph nodes submitted or found Pathologic Stage Classification (pTNM, AJCC 8th Edition): pT1b, pNX Additional Pathologic Findings: Adenomyosis. Leiomyomata.   04/07/2020 Surgery   PRE-OPERATIVE DIAGNOSIS:  Large prolapsed fibroid vaginally, menometrorrhagia with anemia   POST-OPERATIVE DIAGNOSIS:  Large prolapsed fibroid vaginally, menometrorrhagia with anemia.  Omental adhesions with anterior abdominal wall.   PROCEDURE:  Procedure(s): XI ROBOTIC TOTAL LAPAROSCOPIC HYSTERECTOMY, BILATERAL SALPINGO OOPHORECTOMY/LYSIS OF ADHESIONS/VAGINAL MYOMECTOMY    FINDINGS: Large necrotic vaginally prolapsed uterine myoma.  Uterus with small subserosal/intramural myoma.  Bilateral tubes post tubal ligation.  Bilateral normal ovaries.  Omental adhesions with anterior abdominal wall.   05/01/2020 Imaging   Ct scan of chest, abdomen and pelvis 1. Multiple ill-defined low density liver masses, suspicious for metastases. These could be further characterized with pre and postcontrast magnetic resonance imaging of the liver. 2. Moderate diffuse vaginal wall thickening, possibly representing edema associated with the patient's recent surgery. 3. Small hiatal hernia. 4. Minimal coronary artery atheromatous calcifications. 5. Cholelithiasis. 6. Tiny rounded area of low density in the posterior spleen, too small to characterize.   05/11/2020 Cancer Staging   Staging form: Corpus Uteri - Adenosarcoma, AJCC 8th Edition - Pathologic: Stage IB (pT1b, pN0, cM0) - Signed by Heath Lark, MD on 05/15/2020   05/14/2020 Imaging   MRI liver 1. The 2 dominant right hepatic lobe lesions have MR imaging features most consistent with  benign cavernous hemangioma. The remaining tiny liver lesions are too small to characterize. Statistically, while these are likely benign, follow-up MRI in 3 months recommended to ensure stability. 2. No other findings to suggest metastatic disease.   05/22/2020 Procedure   Placement of a subcutaneous port device. Catheter tip at the superior cavoatrial junction   05/29/2020 -  Chemotherapy   The patient had carboplatin and taxol for chemotherapy treatment.     07/22/2020 Imaging   1. Status post hysterectomy and bilateral oophorectomy. Irregular soft tissue density in the region of the vaginal cuff is relatively similar to on the prior exam. Although this could represent developing scar or chronic hematoma, residual disease cannot be excluded. This could either be re-evaluated at follow-up or if a more aggressive approach is desired, characterized with PET. 2. No other evidence of metastatic disease in the abdomen or pelvis. 3. Hepatic hemangiomas, as before. Smaller liver lesions are unchanged, favoring a benign etiology. 4. Cholelithiasis. 5. Tiny hiatal hernia. 6. Aortic Atherosclerosis (ICD10-I70.0).     REVIEW OF SYSTEMS:   Constitutional: Denies fevers, chills or abnormal weight loss Eyes: Denies blurriness of vision Ears, nose, mouth, throat, and face: Denies mucositis or sore throat Cardiovascular: Denies palpitation, chest discomfort or lower extremity swelling Gastrointestinal:  Denies nausea, heartburn or change in bowel habits Skin: Denies abnormal skin rashes Lymphatics: Denies new lymphadenopathy or easy bruising Behavioral/Psych: Mood is stable, no new changes  All other systems were reviewed with the patient and are negative.  I have reviewed the past medical history, past surgical history, social history and family history with the patient and they are unchanged from previous note.  ALLERGIES:  is allergic to latex and sulfur.  MEDICATIONS:  Current Outpatient  Medications  Medication Sig Dispense Refill  . albuterol (VENTOLIN HFA) 108 (90  Base) MCG/ACT inhaler Inhale 2 puffs into the lungs every 6 (six) hours as needed for wheezing or shortness of breath. 8 g 2  . Blood Pressure Monitoring (BLOOD PRESSURE MONITOR AUTOMAT) DEVI Use to check Blood pressures daily 1 each 0  . cyclobenzaprine (FLEXERIL) 10 MG tablet Take 1 tablet (10 mg total) by mouth at bedtime as needed for muscle spasms. 15 tablet 0  . famotidine (PEPCID) 20 MG tablet TAKE 1 TABLET BY MOUTH TWICE DAILY FOR HEART 180 tablet 1  . gabapentin (NEURONTIN) 300 MG capsule Take 1 capsule (300 mg total) by mouth 2 (two) times daily. 60 capsule 11  . hydrochlorothiazide (MICROZIDE) 12.5 MG capsule Take 1 capsule by mouth once daily 90 capsule 1  . HYDROmorphone (DILAUDID) 4 MG tablet Take 1 tablet (4 mg total) by mouth every 6 (six) hours as needed for severe pain. 30 tablet 0  . ibuprofen (ADVIL) 800 MG tablet Take 800 mg by mouth 3 (three) times daily.    Marland Kitchen lidocaine-prilocaine (EMLA) cream Apply to affected area once 30 g 3  . losartan (COZAAR) 50 MG tablet Take 1 tablet by mouth once daily 90 tablet 1  . ondansetron (ZOFRAN) 8 MG tablet Take 1 tablet (8 mg total) by mouth every 8 (eight) hours as needed. 30 tablet 1  . predniSONE (DELTASONE) 20 MG tablet Take 1 tablet (20 mg total) by mouth daily with breakfast. 15 tablet 0  . prochlorperazine (COMPAZINE) 10 MG tablet Take 1 tablet (10 mg total) by mouth every 6 (six) hours as needed (Nausea or vomiting). 30 tablet 1   No current facility-administered medications for this visit.    PHYSICAL EXAMINATION: ECOG PERFORMANCE STATUS: 1 - Symptomatic but completely ambulatory  Vitals:   10/01/20 1238  BP: 111/71  Pulse: 85  Resp: 20  Temp: 98 F (36.7 C)  SpO2: 100%   Filed Weights   10/01/20 1238  Weight: 211 lb (95.7 kg)    GENERAL:alert, no distress and comfortable SKIN: Noted minor acneform rash NEURO: alert & oriented x 3  with fluent speech, no focal motor/sensory deficits  LABORATORY DATA:  I have reviewed the data as listed    Component Value Date/Time   NA 140 09/11/2020 0915   NA 142 04/07/2017 0810   K 3.6 09/11/2020 0915   K 3.8 04/07/2017 0810   CL 104 09/11/2020 0915   CO2 26 09/11/2020 0915   CO2 28 04/07/2017 0810   GLUCOSE 187 (H) 09/11/2020 0915   GLUCOSE 100 04/07/2017 0810   BUN 17 09/11/2020 0915   BUN 16.8 04/07/2017 0810   CREATININE 0.85 09/11/2020 0915   CREATININE 0.9 04/07/2017 0810   CALCIUM 10.3 09/11/2020 0915   CALCIUM 9.9 04/07/2017 0810   PROT 8.3 (H) 09/11/2020 0915   PROT 8.0 04/07/2017 0810   ALBUMIN 4.4 09/11/2020 0915   ALBUMIN 4.4 04/07/2017 0810   AST 15 09/11/2020 0915   AST 18 04/07/2017 0810   ALT 15 09/11/2020 0915   ALT 23 04/07/2017 0810   ALKPHOS 89 09/11/2020 0915   ALKPHOS 79 04/07/2017 0810   BILITOT 0.6 09/11/2020 0915   BILITOT 0.70 04/07/2017 0810   GFRNONAA >60 09/11/2020 0915   GFRAA >60 08/21/2020 0805    No results found for: SPEP, UPEP  Lab Results  Component Value Date   WBC 6.3 09/11/2020   NEUTROABS 5.0 09/11/2020   HGB 10.5 (L) 09/11/2020   HCT 32.5 (L) 09/11/2020   MCV 85.8 09/11/2020   PLT  71 (L) 09/11/2020      Chemistry      Component Value Date/Time   NA 140 09/11/2020 0915   NA 142 04/07/2017 0810   K 3.6 09/11/2020 0915   K 3.8 04/07/2017 0810   CL 104 09/11/2020 0915   CO2 26 09/11/2020 0915   CO2 28 04/07/2017 0810   BUN 17 09/11/2020 0915   BUN 16.8 04/07/2017 0810   CREATININE 0.85 09/11/2020 0915   CREATININE 0.9 04/07/2017 0810      Component Value Date/Time   CALCIUM 10.3 09/11/2020 0915   CALCIUM 9.9 04/07/2017 0810   ALKPHOS 89 09/11/2020 0915   ALKPHOS 79 04/07/2017 0810   AST 15 09/11/2020 0915   AST 18 04/07/2017 0810   ALT 15 09/11/2020 0915   ALT 23 04/07/2017 0810   BILITOT 0.6 09/11/2020 0915   BILITOT 0.70 04/07/2017 0810

## 2020-10-01 NOTE — Assessment & Plan Note (Signed)
Unfortunately, she is still experiencing multiple side effects from treatment She is not able to return back to work I will see her again at the end of the month for further follow-up to review imaging test results

## 2020-10-02 ENCOUNTER — Encounter: Payer: Self-pay | Admitting: Hematology and Oncology

## 2020-10-12 ENCOUNTER — Other Ambulatory Visit: Payer: Self-pay

## 2020-10-12 ENCOUNTER — Inpatient Hospital Stay: Payer: PRIVATE HEALTH INSURANCE

## 2020-10-12 ENCOUNTER — Ambulatory Visit (HOSPITAL_COMMUNITY)
Admission: RE | Admit: 2020-10-12 | Discharge: 2020-10-12 | Disposition: A | Discharge status: Home / Self Care | Payer: PRIVATE HEALTH INSURANCE | Source: Ambulatory Visit | Attending: Hematology and Oncology | Admitting: Hematology and Oncology

## 2020-10-12 DIAGNOSIS — Z7189 Other specified counseling: Secondary | ICD-10-CM

## 2020-10-12 DIAGNOSIS — C55 Malignant neoplasm of uterus, part unspecified: Secondary | ICD-10-CM | POA: Insufficient documentation

## 2020-10-12 DIAGNOSIS — C549 Malignant neoplasm of corpus uteri, unspecified: Secondary | ICD-10-CM

## 2020-10-12 DIAGNOSIS — K769 Liver disease, unspecified: Secondary | ICD-10-CM

## 2020-10-12 LAB — CMP (CANCER CENTER ONLY)
ALT: 22 U/L (ref 0–44)
AST: 16 U/L (ref 15–41)
Albumin: 4 g/dL (ref 3.5–5.0)
Alkaline Phosphatase: 82 U/L (ref 38–126)
Anion gap: 8 (ref 5–15)
BUN: 15 mg/dL (ref 6–20)
CO2: 28 mmol/L (ref 22–32)
Calcium: 9.6 mg/dL (ref 8.9–10.3)
Chloride: 101 mmol/L (ref 98–111)
Creatinine: 0.93 mg/dL (ref 0.44–1.00)
GFR, Estimated: >60 mL/min (ref >60)
Glucose, Bld: 138 mg/dL — ABNORMAL HIGH (ref 70–99)
Potassium: 3.5 mmol/L (ref 3.5–5.1)
Sodium: 137 mmol/L (ref 135–145)
Total Bilirubin: 0.5 mg/dL (ref 0.3–1.2)
Total Protein: 7.5 g/dL (ref 6.5–8.1)

## 2020-10-12 LAB — CBC WITH DIFFERENTIAL (CANCER CENTER ONLY)
Abs Immature Granulocytes: 0.01 10*3/uL (ref 0.00–0.07)
Basophils Absolute: 0 10*3/uL (ref 0.0–0.1)
Basophils Relative: 0 %
Eosinophils Absolute: 0.1 10*3/uL (ref 0.0–0.5)
Eosinophils Relative: 2 %
HCT: 30.5 % — ABNORMAL LOW (ref 36.0–46.0)
Hemoglobin: 9.9 g/dL — ABNORMAL LOW (ref 12.0–15.0)
Immature Granulocytes: 0 %
Lymphocytes Relative: 41 %
Lymphs Abs: 2.1 10*3/uL (ref 0.7–4.0)
MCH: 29.1 pg (ref 26.0–34.0)
MCHC: 32.5 g/dL (ref 30.0–36.0)
MCV: 89.7 fL (ref 80.0–100.0)
Monocytes Absolute: 0.6 10*3/uL (ref 0.1–1.0)
Monocytes Relative: 12 %
Neutro Abs: 2.3 10*3/uL (ref 1.7–7.7)
Neutrophils Relative %: 45 %
Platelet Count: 61 10*3/uL — ABNORMAL LOW (ref 150–400)
RBC: 3.4 MIL/uL — ABNORMAL LOW (ref 3.87–5.11)
RDW: 17.5 % — ABNORMAL HIGH (ref 11.5–15.5)
WBC Count: 5.1 10*3/uL (ref 4.0–10.5)
nRBC: 0 % (ref 0.0–0.2)

## 2020-10-12 MED ORDER — IOHEXOL 300 MG/ML  SOLN
100.0000 mL | Freq: Once | INTRAMUSCULAR | Status: AC | PRN
  Administered 2020-10-12: 100 mL via INTRAVENOUS

## 2020-10-12 MED ORDER — SODIUM CHLORIDE 0.9% FLUSH
10.0000 mL | Freq: Once | INTRAVENOUS | Status: AC
  Administered 2020-10-12: 10 mL
  Filled 2020-10-12: qty 10

## 2020-10-12 MED ORDER — HEPARIN SOD (PORK) LOCK FLUSH 100 UNIT/ML IV SOLN
INTRAVENOUS | Status: AC
  Administered 2020-10-12: 500 [IU] via INTRAVENOUS
  Filled 2020-10-12: qty 5

## 2020-10-12 MED ORDER — HEPARIN SOD (PORK) LOCK FLUSH 100 UNIT/ML IV SOLN: 500.0000 [IU] | Freq: Once | INTRAVENOUS | Status: AC

## 2020-10-12 NOTE — Patient Instructions (Signed)

## 2020-10-13 ENCOUNTER — Encounter: Payer: Self-pay | Admitting: Hematology and Oncology

## 2020-10-13 ENCOUNTER — Other Ambulatory Visit: Payer: Self-pay

## 2020-10-13 ENCOUNTER — Inpatient Hospital Stay (HOSPITAL_BASED_OUTPATIENT_CLINIC_OR_DEPARTMENT_OTHER): Payer: PRIVATE HEALTH INSURANCE | Admitting: Hematology and Oncology

## 2020-10-13 VITALS — BP 125/69 | HR 79 | Temp 97.8°F | Resp 17 | Ht 64.0 in | Wt 213.6 lb

## 2020-10-13 DIAGNOSIS — D61818 Other pancytopenia: Secondary | ICD-10-CM | POA: Diagnosis not present

## 2020-10-13 DIAGNOSIS — R5381 Other malaise: Secondary | ICD-10-CM | POA: Insufficient documentation

## 2020-10-13 DIAGNOSIS — T50905A Adverse effect of unspecified drugs, medicaments and biological substances, initial encounter: Secondary | ICD-10-CM | POA: Diagnosis not present

## 2020-10-13 DIAGNOSIS — R739 Hyperglycemia, unspecified: Secondary | ICD-10-CM

## 2020-10-13 DIAGNOSIS — C55 Malignant neoplasm of uterus, part unspecified: Secondary | ICD-10-CM | POA: Diagnosis not present

## 2020-10-13 DIAGNOSIS — C549 Malignant neoplasm of corpus uteri, unspecified: Secondary | ICD-10-CM

## 2020-10-13 DIAGNOSIS — E669 Obesity, unspecified: Secondary | ICD-10-CM

## 2020-10-13 NOTE — Assessment & Plan Note (Signed)
I reviewed multiple CT imaging with the patient She have some mild persistent soft tissue thickening near the top and of the vagina but could be healing/scar tissue She is not symptomatic from that standpoint She is still dealing with excessive fatigue and reduced stamina from side effects of chemotherapy She is still pancytopenic I do not feel strongly she is able to return back to work full-time I recommend referral to physical therapy and rehab to help her recover fully from side effects of treatment and she is in agreement We discussed future follow-up We discussed the risk and benefits of keeping her port patent with maintaining flushes She will see GYN surgeon in 3 months for pelvic exam I will see her back in 6 months for further follow-up and repeat CT imaging

## 2020-10-13 NOTE — Progress Notes (Signed)
[facility] OFFICE PROGRESS NOTE  Patient Care Team: [name], MD as PCP - General (Family Medicine) Buford Dresser, MD as PCP - Cardiology (Cardiology) Awanda Mink Craige Cotta, RN as Oncology Nurse Navigator (Oncology)  ASSESSMENT & PLAN:  Adenosarcoma of body of uterus [facility]) I reviewed multiple CT imaging with the patient She have some mild persistent soft tissue thickening near the top and of the vagina but could be healing/scar tissue She is not symptomatic from that standpoint She is still dealing with excessive fatigue and reduced stamina from side effects of chemotherapy She is still pancytopenic I do not feel strongly she is able to return back to work full-time I recommend referral to physical therapy and rehab to help her recover fully from side effects of treatment and she is in agreement We discussed future follow-up We discussed the risk and benefits of keeping her port patent with maintaining flushes She will see GYN surgeon in 3 months for pelvic exam I will see her back in 6 months for further follow-up and repeat CT imaging  Pancytopenia, acquired ([place]) This is due to residual side effects of treatment This likely contributed to her feeling of excessive fatigue  Drug-induced hyperglycemia She has persistent hypoglycemia despite stopping treatment I suspect she is at risk of developing full-blown diabetes We discussed the importance of weight management and dietary control She wants to get help to lose weight and recommendation of how to clean healthy living I recommend referral to medical weight management center and she is in agreement  Physical debility She is not able to return back to work due to generalized weakness and reduced stamina I spent some time completing disability paperwork and a form for her workplace She is not likely able to return back to work by the end of next month I certify her disabled until November 21, 2020   Orders  Placed This Encounter  Procedures  . CT ABDOMEN PELVIS W CONTRAST    Standing Status:   Future    Standing Expiration Date:   10/13/2021    Order Specific Question:   If indicated for the ordered procedure, I authorize the administration of contrast media per Radiology protocol    Answer:   Yes    Order Specific Question:   Preferred imaging location?    Answer:   [facility]    Order Specific Question:   Radiology Contrast Protocol - do NOT remove file path    Answer:   [email or web address]    Order Specific Question:   Is patient pregnant?    Answer:   No  . Amb Ref to Medical Weight Management    Referral Priority:   Routine    Referral Type:   Consultation    Number of Visits Requested:   1  . Ambulatory referral to Physical Therapy    Referral Priority:   Routine    Referral Type:   Physical Medicine    Referral Reason:   Specialty Services Required    Requested Specialty:   Physical Therapy    Number of Visits Requested:   1    All questions were answered. The patient knows to call the clinic with any problems, questions or concerns. The total time spent in the appointment was 55 minutes encounter with patients including review of chart and various tests results, discussions about plan of care and coordination of care plan   Heath Lark, MD 10/13/2020 10:31 AM  INTERVAL HISTORY: Please see below for  problem oriented charting. She returns to review test results Since her last time I saw her, she continues to have generalized weakness She is not able to focus well and she does not think she is able to return back to work She has some mild residual peripheral neuropathy No recent bleeding  SUMMARY OF ONCOLOGIC HISTORY: Oncology History Overview Note  MSI stable   Adenosarcoma of body of uterus ([place])  03/03/2020 Initial Diagnosis   She developed PMB in early April after going through menopause at approximately age 59. She saw  her PCP on 4/13 for bleeding and an ultrasound was obtained showing a thickened endometrial lining and 3-4cm mass within the endocervical canal. She was referred to [facility], started on Megace and ultimately underwent a TRH/BSO on 5/18 for abnormal uterine bleeding with resulting anemia, large prolapsing fibroid.    03/06/2020 Imaging   US pelvis Markedly thickened heterogeneous, and hypervascular endometrial complex up to 38 mm thick highly concerning for endometrial neoplasm.   Additional more focal 3.0 x 2.8 x 5.3 cm diameter mass within endocervical canal, may represent extension of above endometrial pathology versus separate endocervical tumor.   Tissue diagnosis recommended.   Nonvisualization of ovaries.   04/07/2020 Pathology Results   A. UTERUS, CERVIX AND BILATERAL FALLOPIAN TUBES AND OVARIES, HYSTERECTOMY AND BILATERAL SALPINGO-OOPHORECTOMY: - Mullerian adenosarcoma of endometrium. - Tumor limited to the uterus. - Tumor invades to less than half of the myometrium. - No involvement of adnexa. - Margins of resection are not involved. - See oncology table and comment. ONCOLOGY TABLE: UTERUS, SARCOMA: Procedure: Total hysterectomy and bilateral salpingo-oophorectomy Specimen Integrity: The uterus, cervix and bilateral fallopian tubes and ovaries were received intact. The nodule clinically identified as vaginally prolapsed uterine fibroid was received separate and disrupted. Tumor Size: Greatest dimension: 7.5 cm Histologic Type: Mullerian adenosarcoma with overgrowth of the epithelial and stromal components Histologic Grade: The epithelial component has proliferated to form endometrioid adenocarcinoma which ranges from low grade to high grade. The stromal component focally shows sarcomatous overgrowth. Myometrial Invasion: Present Depth of invasion: 4 mm Myometrial thickness: 30 mm Other Tissue/Organ Involvement: Not identified Margins: Uninvolved by sarcoma Lymphovascular  Invasion: Not identified Regional Lymph Nodes: No lymph nodes submitted or found Pathologic Stage Classification (pTNM, AJCC 8th Edition): pT1b, pNX Additional Pathologic Findings: Adenomyosis. Leiomyomata.   04/07/2020 Surgery   PRE-OPERATIVE DIAGNOSIS:  Large prolapsed fibroid vaginally, menometrorrhagia with anemia   POST-OPERATIVE DIAGNOSIS:  Large prolapsed fibroid vaginally, menometrorrhagia with anemia.  Omental adhesions with anterior abdominal wall.   PROCEDURE:  Procedure(s): XI ROBOTIC TOTAL LAPAROSCOPIC HYSTERECTOMY, BILATERAL SALPINGO OOPHORECTOMY/LYSIS OF ADHESIONS/VAGINAL MYOMECTOMY    FINDINGS: Large necrotic vaginally prolapsed uterine myoma.  Uterus with small subserosal/intramural myoma.  Bilateral tubes post tubal ligation.  Bilateral normal ovaries.  Omental adhesions with anterior abdominal wall.   05/01/2020 Imaging   Ct scan of chest, abdomen and pelvis 1. Multiple ill-defined low density liver masses, suspicious for metastases. These could be further characterized with pre and postcontrast magnetic resonance imaging of the liver. 2. Moderate diffuse vaginal wall thickening, possibly representing edema associated with the patient's recent surgery. 3. Small hiatal hernia. 4. Minimal coronary artery atheromatous calcifications. 5. Cholelithiasis. 6. Tiny rounded area of low density in the posterior spleen, too small to characterize.   05/11/2020 Cancer Staging   Staging form: Corpus Uteri - Adenosarcoma, AJCC 8th Edition - Pathologic: Stage IB (pT1b, pN0, cM0) - Signed by Heath Lark, MD on 05/15/2020   05/14/2020 Imaging   MRI liver 1.  The 2 dominant right hepatic lobe lesions have MR imaging features most consistent with benign cavernous hemangioma. The remaining tiny liver lesions are too small to characterize. Statistically, while these are likely benign, follow-up MRI in 3 months recommended to ensure stability. 2. No other findings to suggest metastatic  disease.   05/22/2020 Procedure   Placement of a subcutaneous port device. Catheter tip at the superior cavoatrial junction   05/29/2020 -  Chemotherapy   The patient had carboplatin and taxol for chemotherapy treatment.     07/22/2020 Imaging   1. Status post hysterectomy and bilateral oophorectomy. Irregular soft tissue density in the region of the vaginal cuff is relatively similar to on the prior exam. Although this could represent developing scar or chronic hematoma, residual disease cannot be excluded. This could either be re-evaluated at follow-up or if a more aggressive approach is desired, characterized with PET. 2. No other evidence of metastatic disease in the abdomen or pelvis. 3. Hepatic hemangiomas, as before. Smaller liver lesions are unchanged, favoring a benign etiology. 4. Cholelithiasis. 5. Tiny hiatal hernia. 6. Aortic Atherosclerosis (ICD10-I70.0).   10/12/2020 Imaging   1. Stable appearance of the vaginal cuff status post hysterectomy. Soft tissue fullness appears unchanged, likely postsurgical in etiology. Correlate with physical examination and tumor markers. 2. No evidence of metastatic disease. 3. Stable hepatic hemangiomas. 4. Cholelithiasis without evidence of cholecystitis or biliary dilatation. 5. Aortic Atherosclerosis (ICD10-I70.0).     REVIEW OF SYSTEMS:   Constitutional: Denies fevers, chills or abnormal weight loss Eyes: Denies blurriness of vision Ears, nose, mouth, throat, and face: Denies mucositis or sore throat Respiratory: Denies cough, dyspnea or wheezes Cardiovascular: Denies palpitation, chest discomfort or lower extremity swelling Gastrointestinal:  Denies nausea, heartburn or change in bowel habits Skin: Denies abnormal skin rashes Lymphatics: Denies new lymphadenopathy or easy bruising Behavioral/Psych: Mood is stable, no new changes  All other systems were reviewed with the patient and are negative.  I have reviewed the past medical  history, past surgical history, social history and family history with the patient and they are unchanged from previous note.  ALLERGIES:  is allergic to latex and sulfur.  MEDICATIONS:  Current Outpatient Medications  Medication Sig Dispense Refill  . albuterol (VENTOLIN HFA) 108 (90 Base) MCG/ACT inhaler Inhale 2 puffs into the lungs every 6 (six) hours as needed for wheezing or shortness of breath. 8 g 2  . Blood Pressure Monitoring (BLOOD PRESSURE MONITOR AUTOMAT) DEVI Use to check Blood pressures daily 1 each 0  . cyclobenzaprine (FLEXERIL) 10 MG tablet Take 1 tablet (10 mg total) by mouth at bedtime as needed for muscle spasms. 15 tablet 0  . famotidine (PEPCID) 20 MG tablet TAKE 1 TABLET BY MOUTH TWICE DAILY FOR HEART 180 tablet 1  . gabapentin (NEURONTIN) 300 MG capsule Take 1 capsule (300 mg total) by mouth 2 (two) times daily. 60 capsule 11  . hydrochlorothiazide (MICROZIDE) 12.5 MG capsule Take 1 capsule by mouth once daily 90 capsule 1  . HYDROmorphone (DILAUDID) 4 MG tablet Take 1 tablet (4 mg total) by mouth every 6 (six) hours as needed for severe pain. 30 tablet 0  . ibuprofen (ADVIL) 800 MG tablet Take 800 mg by mouth 3 (three) times daily.    Marland Kitchen lidocaine-prilocaine (EMLA) cream Apply to affected area once 30 g 3  . losartan (COZAAR) 50 MG tablet Take 1 tablet by mouth once daily 90 tablet 1  . ondansetron (ZOFRAN) 8 MG tablet Take 1 tablet (8 mg  total) by mouth every 8 (eight) hours as needed. 30 tablet 1  . predniSONE (DELTASONE) 20 MG tablet Take 1 tablet (20 mg total) by mouth daily with breakfast. 15 tablet 0  . prochlorperazine (COMPAZINE) 10 MG tablet Take 1 tablet (10 mg total) by mouth every 6 (six) hours as needed (Nausea or vomiting). 30 tablet 1   No current facility-administered medications for this visit.    PHYSICAL EXAMINATION: ECOG PERFORMANCE STATUS: 1 - Symptomatic but completely ambulatory  Vitals:   10/13/20 0806  BP: 125/69  Pulse: 79  Resp: 17   Temp: 97.8 F (36.6 C)  SpO2: 100%   Filed Weights   10/13/20 0806  Weight: 213 lb 9.6 oz (96.9 kg)    GENERAL:alert, no distress and comfortable NEURO: alert & oriented x 3 with fluent speech, no focal motor/sensory deficits  LABORATORY DATA:  I have reviewed the data as listed    Component Value Date/Time   NA 137 10/12/2020 0830   NA 142 04/07/2017 0810   K 3.5 10/12/2020 0830   K 3.8 04/07/2017 0810   CL 101 10/12/2020 0830   CO2 28 10/12/2020 0830   CO2 28 04/07/2017 0810   GLUCOSE 138 (H) 10/12/2020 0830   GLUCOSE 100 04/07/2017 0810   BUN 15 10/12/2020 0830   BUN 16.8 04/07/2017 0810   CREATININE 0.93 10/12/2020 0830   CREATININE 0.9 04/07/2017 0810   CALCIUM 9.6 10/12/2020 0830   CALCIUM 9.9 04/07/2017 0810   PROT 7.5 10/12/2020 0830   PROT 8.0 04/07/2017 0810   ALBUMIN 4.0 10/12/2020 0830   ALBUMIN 4.4 04/07/2017 0810   AST 16 10/12/2020 0830   AST 18 04/07/2017 0810   ALT 22 10/12/2020 0830   ALT 23 04/07/2017 0810   ALKPHOS 82 10/12/2020 0830   ALKPHOS 79 04/07/2017 0810   BILITOT 0.5 10/12/2020 0830   BILITOT 0.70 04/07/2017 0810   GFRNONAA >60 10/12/2020 0830   GFRAA >60 08/21/2020 0805    No results found for: SPEP, UPEP  Lab Results  Component Value Date   WBC 5.1 10/12/2020   NEUTROABS 2.3 10/12/2020   HGB 9.9 (L) 10/12/2020   HCT 30.5 (L) 10/12/2020   MCV 89.7 10/12/2020   PLT 61 (L) 10/12/2020      Chemistry      Component Value Date/Time   NA 137 10/12/2020 0830   NA 142 04/07/2017 0810   K 3.5 10/12/2020 0830   K 3.8 04/07/2017 0810   CL 101 10/12/2020 0830   CO2 28 10/12/2020 0830   CO2 28 04/07/2017 0810   BUN 15 10/12/2020 0830   BUN 16.8 04/07/2017 0810   CREATININE 0.93 10/12/2020 0830   CREATININE 0.9 04/07/2017 0810      Component Value Date/Time   CALCIUM 9.6 10/12/2020 0830   CALCIUM 9.9 04/07/2017 0810   ALKPHOS 82 10/12/2020 0830   ALKPHOS 79 04/07/2017 0810   AST 16 10/12/2020 0830   AST 18 04/07/2017  0810   ALT 22 10/12/2020 0830   ALT 23 04/07/2017 0810   BILITOT 0.5 10/12/2020 0830   BILITOT 0.70 04/07/2017 0810       RADIOGRAPHIC STUDIES: I reviewed multiple CT imaging with the patient I have personally reviewed the radiological images as listed and agreed with the findings in the report. CT ABDOMEN PELVIS W CONTRAST  Result Date: 10/12/2020 CLINICAL DATA:  Uterine/cervical cancer post chemotherapy completed 09/11/2020. EXAM: CT ABDOMEN AND PELVIS WITH CONTRAST TECHNIQUE: Multidetector CT imaging of the abdomen  and pelvis was performed using the standard protocol following bolus administration of intravenous contrast. CONTRAST:  174m OMNIPAQUE IOHEXOL 300 MG/ML  SOLN COMPARISON:  Abdominopelvic CT 07/22/2020 and 05/01/2020. Abdominal MRI 05/14/2020. FINDINGS: Lower chest: Clear lung bases. No significant pleural or pericardial effusion. Central venous catheter projects to the level of the superior right atrium. Hepatobiliary: Low-density hepatic lesions measuring up to 2.0 cm posteriorly in the right hepatic lobe on image 15/2 are stable, previously characterized as hemangiomas. No new or enlarging hepatic lesions. Peripherally calcified gallstone without gallbladder wall thickening or biliary dilatation. Pancreas: Unremarkable. No pancreatic ductal dilatation or surrounding inflammatory changes. Spleen: Normal in size without focal abnormality. Adrenals/Urinary Tract: Both adrenal glands appear normal. Stable tiny low-density lesion in the interpolar region of the right kidney, probably a cyst. No evidence of renal mass, urinary tract calculus or hydronephrosis. The bladder appears normal. Stomach/Bowel: No evidence of bowel wall thickening, distention or surrounding inflammatory change. The appendix appears normal. Stable small duodenal diverticula. Vascular/Lymphatic: There are no enlarged abdominal or pelvic lymph nodes. Minimal aortoiliac atherosclerosis. The portal, superior mesenteric  and splenic veins are patent. Reproductive: Status post hysterectomy with stable appearance of the vaginal cuff. Soft tissue fullness on the right measures up to 2.9 x 2.4 cm on image 77/2, and on the left up to 3.0 cm on image 78/2. No adnexal mass. Other: Stable small ventral hernia containing fat. No ascites or peritoneal nodularity. Musculoskeletal: No acute or significant osseous findings. Mild lumbar spondylosis. IMPRESSION: 1. Stable appearance of the vaginal cuff status post hysterectomy. Soft tissue fullness appears unchanged, likely postsurgical in etiology. Correlate with physical examination and tumor markers. 2. No evidence of metastatic disease. 3. Stable hepatic hemangiomas. 4. Cholelithiasis without evidence of cholecystitis or biliary dilatation. 5. Aortic Atherosclerosis (ICD10-I70.0). Electronically Signed   By: WRichardean SaleM.D.   On: 10/12/2020 13:34

## 2020-10-13 NOTE — Assessment & Plan Note (Signed)
She is not able to return back to work due to generalized weakness and reduced stamina I spent some time completing disability paperwork and a form for her workplace She is not likely able to return back to work by the end of next month I certify her disabled until November 21, 2020

## 2020-10-13 NOTE — Assessment & Plan Note (Signed)
This is due to residual side effects of treatment This likely contributed to her feeling of excessive fatigue

## 2020-10-13 NOTE — Assessment & Plan Note (Signed)
She has persistent hypoglycemia despite stopping treatment I suspect she is at risk of developing full-blown diabetes We discussed the importance of weight management and dietary control She wants to get help to lose weight and recommendation of how to clean healthy living I recommend referral to medical weight management center and she is in agreement

## 2020-10-18 ENCOUNTER — Other Ambulatory Visit: Payer: Self-pay | Admitting: Family Medicine

## 2020-10-19 ENCOUNTER — Telehealth: Payer: Self-pay | Admitting: Oncology

## 2020-10-19 NOTE — Telephone Encounter (Signed)
Left a message with appointment to see Dr. Denman George on 12/31/2020 at 1:00.

## 2020-10-21 ENCOUNTER — Encounter: Payer: Self-pay | Admitting: Hematology and Oncology

## 2020-10-23 ENCOUNTER — Ambulatory Visit (INDEPENDENT_AMBULATORY_CARE_PROVIDER_SITE_OTHER): Payer: PRIVATE HEALTH INSURANCE | Admitting: Family Medicine

## 2020-10-23 ENCOUNTER — Other Ambulatory Visit: Payer: Self-pay

## 2020-10-23 ENCOUNTER — Encounter: Payer: Self-pay | Admitting: Family Medicine

## 2020-10-23 VITALS — BP 100/64 | HR 73 | Temp 97.6°F | Ht 64.0 in | Wt 209.5 lb

## 2020-10-23 DIAGNOSIS — I10 Essential (primary) hypertension: Secondary | ICD-10-CM | POA: Diagnosis not present

## 2020-10-23 DIAGNOSIS — R7309 Other abnormal glucose: Secondary | ICD-10-CM | POA: Diagnosis not present

## 2020-10-23 DIAGNOSIS — H65112 Acute and subacute allergic otitis media (mucoid) (sanguinous) (serous), left ear: Secondary | ICD-10-CM

## 2020-10-23 LAB — POCT GLYCOSYLATED HEMOGLOBIN (HGB A1C): Hemoglobin A1C: 6.2 % — AB (ref 4.0–5.6)

## 2020-10-23 MED ORDER — BLOOD PRESSURE MONITOR AUTOMAT DEVI: 0 refills | Status: DC

## 2020-10-23 MED ORDER — AZITHROMYCIN 250 MG PO TABS: ORAL_TABLET | ORAL | 0 refills | Status: DC

## 2020-10-23 MED ORDER — LOSARTAN POTASSIUM-HCTZ 50-12.5 MG PO TABS: 1.0000 | ORAL_TABLET | Freq: Every day | ORAL | 3 refills | Status: DC

## 2020-10-23 MED ORDER — CYCLOBENZAPRINE HCL 10 MG PO TABS: 10.0000 mg | ORAL_TABLET | Freq: Every evening | ORAL | 0 refills | Status: DC | PRN

## 2020-10-23 NOTE — Assessment & Plan Note (Signed)
Likely progressed from prediabetes to diabetes with chemo, prednisone and weight gain. Eval with A1C. She will get back on track with healthy eating and regular exercsie.. has complete chemo now.  re-eval in 3 months.

## 2020-10-23 NOTE — Patient Instructions (Addendum)
Continue flonase 2 sprays per nostril daily.  Can start nasal saline spray or irrigation daily.  Complete antibiotics: azithromycin.  Call if not improving in 1 week... call  For ENT referral.  Work on low carb diet, increase activity as able.

## 2020-10-23 NOTE — Assessment & Plan Note (Signed)
Treat with flonase , antihistamine for ETD and cover for bacterial super-infeciton with azithromycin.  If not improving refer to ENT.

## 2020-10-23 NOTE — Progress Notes (Signed)
Chief Complaint  Patient presents with  . Follow-up    Left Ear    History of Present Illness: HPI  59 year old female last chemo 09/11/2020 for breast cancer presents with new onset ear congestion.  2 months ago.. had left ear pain.. treated with prednisone.   She reports runny nose, sneeze , cough and sinus congestion  Over Thanksgiving holiday... now resolved.  No fever, did not feel bad.   She still has some congestion and post nasal drip.Marland Kitchen  Left ear fullness, intermittent pain, decreased hearing.  hears echo in left ear, vibration.  She has noted her eyes a re red...  Told to take lubricating drops given chemo took eyelashes.  Left  side face pain, mild headache off and on.   No recent antibiotics.  Using Zyrtec, mucinex, flonase 2 sprays per nostril daily.    She has noted CBGs have been elevated off and on with labs.. hx of prediabetes.  Has had prednisone off and on through chemo.. has gained weight.  This visit occurred during the SARS-CoV-2 public health emergency.  Safety protocols were in place, including screening questions prior to the visit, additional usage of staff PPE, and extensive cleaning of exam room while observing appropriate contact time as indicated for disinfecting solutions.   COVID 19 screen:  No recent travel or known exposure to COVID19 The patient denies respiratory symptoms of COVID 19 at this time. The importance of social distancing was discussed today.     Review of Systems  Constitutional: Negative for chills and fever.  HENT: Positive for congestion and ear pain.   Eyes: Negative for pain and redness.  Respiratory: Negative for cough and shortness of breath.   Cardiovascular: Negative for chest pain, palpitations and leg swelling.  Gastrointestinal: Negative for abdominal pain, blood in stool, constipation, diarrhea, nausea and vomiting.  Genitourinary: Negative for dysuria.  Musculoskeletal: Negative for falls and myalgias.  Skin:  Negative for rash.  Neurological: Negative for dizziness.  Psychiatric/Behavioral: Negative for depression. The patient is not nervous/anxious.       Past Medical History:  Diagnosis Date  . Allergy    occasionally  . Anemia    yrs ago, low platelets  . Arthritis    RA  . Blood transfusion without reported diagnosis    1985 with c section  . Borderline diabetic   . Dyspnea    While taking Megace  . GERD (gastroesophageal reflux disease)   . Hypertension   . Obstructive sleep apnea syndrome 01/07/2020  . Thrombocytopenia ([place])     reports that she has quit smoking. Her smoking use included cigarettes. She has a 3.00 pack-year smoking history. She has never used smokeless tobacco. She reports current alcohol use. She reports that she does not use drugs.   Current Outpatient Medications:  .  albuterol (VENTOLIN HFA) 108 (90 Base) MCG/ACT inhaler, Inhale 2 puffs into the lungs every 6 (six) hours as needed for wheezing or shortness of breath., Disp: 8 g, Rfl: 2 .  Blood Pressure Monitoring (BLOOD PRESSURE MONITOR AUTOMAT) DEVI, Use to check Blood pressures daily, Disp: 1 each, Rfl: 0 .  cyclobenzaprine (FLEXERIL) 10 MG tablet, Take 1 tablet (10 mg total) by mouth at bedtime as needed for muscle spasms., Disp: 15 tablet, Rfl: 0 .  famotidine (PEPCID) 20 MG tablet, TAKE 1 TABLET BY MOUTH TWICE DAILY FOR HEART, Disp: 180 tablet, Rfl: 1 .  ibuprofen (ADVIL) 800 MG tablet, Take 800 mg by mouth 3 (three) times  daily., Disp: , Rfl:  .  lidocaine-prilocaine (EMLA) cream, Apply to affected area once, Disp: 30 g, Rfl: 3   Observations/Objective: Blood pressure 100/64, pulse 73, temperature 97.6 F (36.4 C), temperature source Temporal, height 5\' 4"  (1.626 m), weight 209 lb 8 oz (95 kg), last menstrual period 11/17/2012, SpO2 98 %.  Physical Exam Constitutional:      General: She is not in acute distress.    Appearance: Normal appearance. She is well-developed. She is obese. She is not  ill-appearing or toxic-appearing.  HENT:     Head: Normocephalic.     Right Ear: Hearing, ear canal and external ear normal. No middle ear effusion. Tympanic membrane is not erythematous, retracted or bulging.     Left Ear: Hearing, ear canal and external ear normal. Tenderness present. A middle ear effusion is present. Tympanic membrane is bulging. Tympanic membrane is not erythematous or retracted. Tympanic membrane has decreased mobility.     Nose: No mucosal edema or rhinorrhea.     Right Sinus: No maxillary sinus tenderness or frontal sinus tenderness.     Left Sinus: No maxillary sinus tenderness or frontal sinus tenderness.     Mouth/Throat:     Pharynx: Uvula midline.  Eyes:     General: Lids are normal. Lids are everted, no foreign bodies appreciated.     Conjunctiva/sclera: Conjunctivae normal.     Pupils: Pupils are equal, round, and reactive to light.  Neck:     Thyroid: No thyroid mass or thyromegaly.     Vascular: No carotid bruit.     Trachea: Trachea normal.  Cardiovascular:     Rate and Rhythm: Normal rate and regular rhythm.     Pulses: Normal pulses.     Heart sounds: Normal heart sounds, S1 normal and S2 normal. No murmur heard.  No friction rub. No gallop.   Pulmonary:     Effort: Pulmonary effort is normal. No tachypnea or respiratory distress.     Breath sounds: Normal breath sounds. No decreased breath sounds, wheezing, rhonchi or rales.  Abdominal:     General: Bowel sounds are normal.     Palpations: Abdomen is soft.     Tenderness: There is no abdominal tenderness.  Musculoskeletal:     Cervical back: Normal range of motion and neck supple.  Skin:    General: Skin is warm and dry.     Findings: No rash.  Neurological:     Mental Status: She is alert.  Psychiatric:        Mood and Affect: Mood is not anxious or depressed.        Speech: Speech normal.        Behavior: Behavior normal. Behavior is cooperative.        Thought Content: Thought content  normal.        Judgment: Judgment normal.      Assessment and Plan   Left otitis media Treat with flonase , antihistamine for ETD and cover for bacterial super-infeciton with azithromycin.  If not improving refer to ENT.  Elevated glucose Likely progressed from prediabetes to diabetes with chemo, prednisone and weight gain. Eval with A1C. She will get back on track with healthy eating and regular exercsie.. has complete chemo now.  re-eval in 3 months.     Eliezer Lofts, MD

## 2020-10-28 ENCOUNTER — Encounter: Payer: Self-pay | Admitting: Cardiology

## 2020-10-28 ENCOUNTER — Ambulatory Visit (INDEPENDENT_AMBULATORY_CARE_PROVIDER_SITE_OTHER): Payer: PRIVATE HEALTH INSURANCE | Admitting: Cardiology

## 2020-10-28 ENCOUNTER — Encounter: Payer: Self-pay | Admitting: Hematology and Oncology

## 2020-10-28 ENCOUNTER — Other Ambulatory Visit: Payer: Self-pay

## 2020-10-28 VITALS — BP 130/83 | HR 65 | Temp 96.1°F | Ht 66.0 in | Wt 212.4 lb

## 2020-10-28 DIAGNOSIS — I10 Essential (primary) hypertension: Secondary | ICD-10-CM

## 2020-10-28 DIAGNOSIS — R0602 Shortness of breath: Secondary | ICD-10-CM

## 2020-10-28 DIAGNOSIS — Z79899 Other long term (current) drug therapy: Secondary | ICD-10-CM | POA: Diagnosis not present

## 2020-10-28 DIAGNOSIS — I2510 Atherosclerotic heart disease of native coronary artery without angina pectoris: Secondary | ICD-10-CM

## 2020-10-28 DIAGNOSIS — Z7189 Other specified counseling: Secondary | ICD-10-CM

## 2020-10-28 MED ORDER — ROSUVASTATIN CALCIUM 5 MG PO TABS: 5.0000 mg | ORAL_TABLET | Freq: Every day | ORAL | 3 refills | Status: DC

## 2020-10-28 NOTE — Progress Notes (Signed)
Cardiology Office Note:    Date:  10/28/2020   ID:  Rachel Rivas, DOB [birth year], MRN [id number]  PCP:  [name], MD  Cardiologist:  Buford Dresser, MD  Referring MD: [name], MD   CC: follow up  History of Present Illness:    Rachel Rivas is a 59 y.o. female with a hx of hypertension, rheumatoid arthritis who is seen for follow up today. I initially met her 11/05/2019 as a new consult at the request of Bedsole, Amy E, MD for the evaluation and management of shortness of breath.  Today: Has been undergoing treatment for uterine cancer since our last visit. 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 Did six rounds of adjuvant chemo (carboplatin and taxol), has been exhausted since then. Has struggled with steroids raising her blood sugars. No formal diabetes diagnosis.  Still has shortness of breath. Noted coronary calcium on imaging. She is working on gradually increasing her activity level.   We discussed the results of her CT 05/01/20 at length today. Noted coronary artery calcifications. We discussed cholesterol and plaque, as well as recommendations for management. With anemia and thrombocytopenia, would not start aspirin. LDL 92 in 12/2019. Discussed statins, she is amenable.  Denies chest pain, shortness of breath at rest. No PND, orthopnea, LE edema or unexpected weight gain. No syncope or palpitations.  Past Medical History:  Diagnosis Date  . Allergy    occasionally  . Anemia    yrs ago, low platelets  . Arthritis    RA  . Blood transfusion without reported diagnosis    1985 with c section  . Borderline diabetic   . Dyspnea    While taking Megace  . GERD (gastroesophageal reflux disease)   . Hypertension   . Obstructive sleep apnea syndrome 01/07/2020  . Thrombocytopenia ([place])     Past Surgical History:  Procedure Laterality Date  . CESAREAN SECTION    . COLONOSCOPY     . IR IMAGING GUIDED PORT INSERTION  05/22/2020  . MYOMECTOMY N/A 04/07/2020   Procedure: VAGINAL MYOMECTOMY;  Surgeon: Princess Bruins, MD;  Location: [facility];  Service: Gynecology;  Laterality: N/A;  . POLYPECTOMY    . ROBOTIC ASSISTED TOTAL HYSTERECTOMY WITH BILATERAL SALPINGO OOPHERECTOMY Bilateral 04/07/2020   Procedure: XI ROBOTIC TOTAL LAPAROSCOPIC HYSTERECTOMY, BILATERAL SALPINGO OOPHORECTOMY;  Surgeon: Princess Bruins, MD;  Location: [facility];  Service: Gynecology;  Laterality: Bilateral;  . STERILIZATION    . TUBAL LIGATION      Current Medications: Current Outpatient Medications on File Prior to Visit  Medication Sig  . albuterol (VENTOLIN HFA) 108 (90 Base) MCG/ACT inhaler Inhale 2 puffs into the lungs every 6 (six) hours as needed for wheezing or shortness of breath.  Marland Kitchen azithromycin (ZITHROMAX) 250 MG tablet 2 tab po x 1 day then 1 tab po daily  . Blood Pressure Monitoring (BLOOD PRESSURE MONITOR AUTOMAT) DEVI Use to check Blood pressures daily  . cyclobenzaprine (FLEXERIL) 10 MG tablet Take 1 tablet (10 mg total) by mouth at bedtime as needed for muscle spasms.  . famotidine (PEPCID) 20 MG tablet TAKE 1 TABLET BY MOUTH TWICE DAILY FOR HEART  . ibuprofen (ADVIL) 800 MG tablet Take 800 mg by mouth 3 (three) times daily.  Marland Kitchen lidocaine-prilocaine (EMLA) cream Apply to affected area once  . losartan-hydrochlorothiazide (HYZAAR) 50-12.5 MG tablet Take 1 tablet by mouth daily.   No current facility-administered medications on file prior  to visit.     Allergies:   Latex and Sulfur   Social History   Tobacco Use  . Smoking status: Former Smoker    Packs/day: 0.25    Years: 12.00    Pack years: 3.00    Types: Cigarettes  . Smokeless tobacco: Never Used  Vaping Use  . Vaping Use: Never used  Substance Use Topics  . Alcohol use: Yes    Alcohol/week: 0.0 standard drinks    Comment: occasional  . Drug use: No    Comment: past;  marijuana    Family History: family history includes Cancer in her mother; Diabetes in her father and mother; Hypertension in her father and mother; Lung cancer in her mother; Stroke in her father; Uterine cancer in her mother. There is no history of Colon cancer, Colon polyps, Esophageal cancer, Rectal cancer, or Stomach cancer. no known MI or CAD. Mother has hypertension, diabetes, cancer. Father had high blood pressure, diabetes, stroke. Both died age 14. Most of her family has lived to very old age.  ROS:   Please see the history of present illness.  Additional pertinent ROS otherwise unremarkable.   EKGs/Labs/Other Studies Reviewed:    The following studies were reviewed today: CT chest 05/01/20 Cardiovascular: Minimal coronary artery calcifications. Normal sized heart.  Nuclear stress test 04/08/2013 Impression Exercise Capacity:  The patient had only fair exercise capacity. BP Response:  Normal blood pressure response. Clinical Symptoms:  Early in stress the patient had some chest pressure. This persisted into recovery. She was given a nitroglycerin and at that time had decreased blood pressure. She was put in the Trendelenburg position and then stabilized. At the end she had a mild residual headache. ECG Impression:  The resting EKG reveals mild nonspecific ST scooping. This becomes somewhat worse with stress but it is not diagnostic. It normalizes early in the recovery period. Comparison with Prior Nuclear Study: No images to compare  Overall Impression:  The patient had a lot of symptoms during the study. She received a nitroglycerin and then had some decreased blood pressure. The EKGs are nonspecific. The nuclear images are normal. There is no scar or ischemia. This is a low risk scan.  LV Ejection Fraction: 69%.  LV Wall Motion:  Normal Wall Motion.   EKG:  EKG is personally reviewed.  The ekg ordered today demonstrates NSR, 65 bpm  Recent Labs: 10/12/2020: ALT 22; BUN  15; Creatinine 0.93; Hemoglobin 9.9; Platelet Count 61; Potassium 3.5; Sodium 137  Recent Lipid Panel    Component Value Date/Time   CHOL 179 12/30/2019 0738   TRIG 66.0 12/30/2019 0738   HDL 73.50 12/30/2019 0738   CHOLHDL 2 12/30/2019 0738   VLDL 13.2 12/30/2019 0738   LDLCALC 92 12/30/2019 0738    Physical Exam:    VS:  BP 130/83   Pulse 65   Temp (!) 96.1 F (35.6 C)   Ht 5' 6"  (1.676 m)   Wt 212 lb 6.4 oz (96.3 kg)   LMP 11/17/2012   SpO2 98%   BMI 34.28 kg/m     Wt Readings from Last 3 Encounters:  10/28/20 212 lb 6.4 oz (96.3 kg)  10/23/20 209 lb 8 oz (95 kg)  10/13/20 213 lb 9.6 oz (96.9 kg)    GEN: Well nourished, well developed in no acute distress HEENT: Normal, moist mucous membranes NECK: No JVD CARDIAC: regular rhythm, normal S1 and S2, no rubs or gallops. No murmur. VASCULAR: Radial and DP pulses 2+ bilaterally.  No carotid bruits RESPIRATORY:  Clear to auscultation without rales, wheezing or rhonchi  ABDOMEN: Soft, non-tender, non-distended MUSCULOSKELETAL:  Ambulates independently SKIN: Warm and dry, no edema NEUROLOGIC:  Alert and oriented x 3. No focal neuro deficits noted. PSYCHIATRIC:  Normal affect   ASSESSMENT:    1. Coronary artery calcification seen on CT scan   2. Nonocclusive coronary atherosclerosis of native coronary artery   3. Medication management   4. Shortness of breath   5. Essential hypertension   6. Cardiac risk counseling   7. Counseling on health promotion and disease prevention    PLAN:    Shortness of breath:  -stable.  -did not complete prior echo -feels that her main issue now is regaining strength from her cancer treatments -she will contact me if symptoms significant worsen  Hypertension: just at goal today -continue losartan-HCTZ  Coronary artery calcification, consistent with nonobstructive CAD Hypercholesterolemia -We reviewed the calcium findings at length. We discussed the pathophysiology of cholesterol  plaque formation, the role of calcium and why it is a marker, how plaque is key to acute MI/CVA, and how known plaque is managed with medications.  -we discussed the data on statins, both in terms of their long term benefit as well as the risk of side effects. Reviewed common misconceptions about statins. Reviewed how we monitor treatment. After shared decision making, patient is agreeable to trialing statin. -we will start rosuvastatin 5 mg. LDL goal <70. Recheck lipids/LFTs in 2 mos -we did discuss association with diabetes diagnosis. She reports her A1c is being followed closely by her PCP  Cardiac risk counseling and prevention recommendations: -recommend heart healthy/Mediterranean diet, with whole grains, fruits, vegetable, fish, lean meats, nuts, and olive oil. Limit salt. -recommend moderate walking, 3-5 times/week for 30-50 minutes each session. Aim for at least 150 minutes.week. Goal should be pace of 3 miles/hours, or walking 1.5 miles in 30 minutes -recommend avoidance of tobacco products. Avoid excess alcohol. -Additional risk factor control:  -Diabetes risk: A1c is 6.2. Counseled on diet/lifestyle/ewight management today  -Weight: class 2 obesity, BMI 34. Would benefit from weight loss, but this was on hold given her recent cancer treatment  -tobacco: former tobacco use, none recent  Plan for follow up: 1 year or sooner as needed  Medication Adjustments/Labs and Tests Ordered: Current medicines are reviewed at length with the patient today.  Concerns regarding medicines are outlined above.  Orders Placed This Encounter  Procedures  . Lipid panel  . Hepatic function panel  . EKG 12-Lead   Meds ordered this encounter  Medications  . rosuvastatin (CRESTOR) 5 MG tablet    Sig: Take 1 tablet (5 mg total) by mouth daily.    Dispense:  90 tablet    Refill:  3    Patient Instructions  Medication Instructions:  Start Rosuvastatin 5 mg daily  *If you need a refill on your  cardiac medications before your next appointment, please call your pharmacy*   Lab Work: Your physician recommends that you return for lab work Feb. 2022 (fasting lipid, LFT).  If you have labs (blood work) drawn today and your tests are completely normal, you will receive your results only by: Marland Kitchen MyChart Message (if you have MyChart) OR . A paper copy in the mail If you have any lab test that is abnormal or we need to change your treatment, we will call you to review the results.   Testing/Procedures: None   Follow-Up: At [facility], you and your health  needs are our priority.  As part of our continuing mission to provide you with exceptional heart care, we have created designated Provider Care Teams.  These Care Teams include your primary Cardiologist (physician) and Advanced Practice Providers (APPs -  Physician Assistants and Nurse Practitioners) who all work together to provide you with the care you need, when you need it.  We recommend signing up for the patient portal called "MyChart".  Sign up information is provided on this After Visit Summary.  MyChart is used to connect with patients for Virtual Visits (Telemedicine).  Patients are able to view lab/test results, encounter notes, upcoming appointments, etc.  Non-urgent messages can be sent to your provider as well.   To learn more about what you can do with MyChart, go to [email or web address].    Your next appointment:   1 year(s)  The format for your next appointment:   In Person  Provider:   Buford Dresser, MD       Signed, Buford Dresser, MD PhD 10/28/2020 12:51 PM    [place]

## 2020-10-28 NOTE — Patient Instructions (Addendum)
Medication Instructions:  Start Rosuvastatin 5 mg daily  *If you need a refill on your cardiac medications before your next appointment, please call your pharmacy*   Lab Work: Your physician recommends that you return for lab work Feb. 2022 (fasting lipid, LFT).  If you have labs (blood work) drawn today and your tests are completely normal, you will receive your results only by: Marland Kitchen MyChart Message (if you have MyChart) OR . A paper copy in the mail If you have any lab test that is abnormal or we need to change your treatment, we will call you to review the results.   Testing/Procedures: None   Follow-Up: At [facility], you and your health needs are our priority.  As part of our continuing mission to provide you with exceptional heart care, we have created designated Provider Care Teams.  These Care Teams include your primary Cardiologist (physician) and Advanced Practice Providers (APPs -  Physician Assistants and Nurse Practitioners) who all work together to provide you with the care you need, when you need it.  We recommend signing up for the patient portal called "MyChart".  Sign up information is provided on this After Visit Summary.  MyChart is used to connect with patients for Virtual Visits (Telemedicine).  Patients are able to view lab/test results, encounter notes, upcoming appointments, etc.  Non-urgent messages can be sent to your provider as well.   To learn more about what you can do with MyChart, go to [email or web address].    Your next appointment:   1 year(s)  The format for your next appointment:   In Person  Provider:   Buford Dresser, MD

## 2020-10-30 ENCOUNTER — Encounter: Payer: Self-pay | Admitting: Hematology and Oncology

## 2020-11-04 ENCOUNTER — Encounter: Payer: Self-pay | Admitting: Hematology and Oncology

## 2020-11-05 ENCOUNTER — Encounter: Payer: Self-pay | Admitting: Hematology and Oncology

## 2020-11-05 ENCOUNTER — Other Ambulatory Visit: Payer: Self-pay | Admitting: Family Medicine

## 2020-11-05 DIAGNOSIS — K219 Gastro-esophageal reflux disease without esophagitis: Secondary | ICD-10-CM

## 2020-11-06 ENCOUNTER — Encounter: Payer: Self-pay | Admitting: Family Medicine

## 2020-11-06 ENCOUNTER — Ambulatory Visit (INDEPENDENT_AMBULATORY_CARE_PROVIDER_SITE_OTHER): Payer: PRIVATE HEALTH INSURANCE | Admitting: Family Medicine

## 2020-11-06 ENCOUNTER — Other Ambulatory Visit: Payer: Self-pay

## 2020-11-06 ENCOUNTER — Ambulatory Visit: Payer: PRIVATE HEALTH INSURANCE | Attending: Hematology and Oncology | Admitting: Physical Therapy

## 2020-11-06 VITALS — BP 122/60 | HR 97 | Temp 97.6°F | Ht 66.0 in | Wt 213.0 lb

## 2020-11-06 DIAGNOSIS — R29898 Other symptoms and signs involving the musculoskeletal system: Secondary | ICD-10-CM | POA: Insufficient documentation

## 2020-11-06 DIAGNOSIS — R209 Unspecified disturbances of skin sensation: Secondary | ICD-10-CM | POA: Diagnosis present

## 2020-11-06 DIAGNOSIS — R21 Rash and other nonspecific skin eruption: Secondary | ICD-10-CM | POA: Insufficient documentation

## 2020-11-06 DIAGNOSIS — M6281 Muscle weakness (generalized): Secondary | ICD-10-CM

## 2020-11-06 MED ORDER — TRIAMCINOLONE ACETONIDE 0.1 % EX CREA: 1.0000 "application " | TOPICAL_CREAM | Freq: Two times a day (BID) | CUTANEOUS | 0 refills | Status: DC

## 2020-11-06 NOTE — Progress Notes (Signed)
Patient ID: Rachel Rivas, female    DOB: 07-29-1961, 59 y.o.   MRN: [id number]  This visit was conducted in person.  BP 122/60 (BP Location: Left Arm, Patient Position: Sitting, Cuff Size: Normal)   Pulse 97   Temp 97.6 F (36.4 C) (Temporal)   Ht 5\' 6"  (1.676 m)   Wt 213 lb (96.6 kg)   LMP 11/17/2012   SpO2 99%   BMI 34.38 kg/m    CC: intermittant rash Subjective:   HPI: Rachel Rivas is a 59 y.o. female presenting on 11/06/2020 for Acute Visit (rash)     She reports that off and on since chemotherapy she has had an intermittent rash appear. Started in 05/2020 on scalp.  Onc felt it may have been secondary to ? steroids.  Rash appears like red bumps, itchy. No pustules , no blisters  She has tried benadryl spray, OTC cortisone cream,    She has completed chemo in  09/11/2020  Dr. Tennis Ship  Has occurred on legs, back , arms and scalp. Occurring several times a month, sometime daily.  No mouth or palmar lesions.  No blisters or pustules.   Most recent episode on right arm , last 5 days or so.      Relevant past medical, surgical, family and social history reviewed and updated as indicated. Interim medical history since our last visit reviewed. Allergies and medications reviewed and updated. Outpatient Medications Prior to Visit  Medication Sig Dispense Refill  . albuterol (VENTOLIN HFA) 108 (90 Base) MCG/ACT inhaler Inhale 2 puffs into the lungs every 6 (six) hours as needed for wheezing or shortness of breath. 8 g 2  . Blood Pressure Monitoring (BLOOD PRESSURE MONITOR AUTOMAT) DEVI Use to check Blood pressures daily 1 each 0  . cyclobenzaprine (FLEXERIL) 10 MG tablet Take 1 tablet (10 mg total) by mouth at bedtime as needed for muscle spasms. 15 tablet 0  . famotidine (PEPCID) 20 MG tablet TAKE 1 TABLET BY MOUTH TWICE DAILY FOR HEART 180 tablet 0  . ibuprofen (ADVIL) 800 MG tablet Take 800 mg by mouth 3 (three) times daily.    Marland Kitchen lidocaine-prilocaine (EMLA) cream  Apply to affected area once 30 g 3  . losartan-hydrochlorothiazide (HYZAAR) 50-12.5 MG tablet Take 1 tablet by mouth daily. 90 tablet 3  . rosuvastatin (CRESTOR) 5 MG tablet Take 1 tablet (5 mg total) by mouth daily. 90 tablet 3  . azithromycin (ZITHROMAX) 250 MG tablet 2 tab po x 1 day then 1 tab po daily (Patient not taking: Reported on 11/06/2020) 6 tablet 0   No facility-administered medications prior to visit.     Per HPI unless specifically indicated in ROS section below Review of Systems  Constitutional: Negative for fatigue and fever.  HENT: Negative for ear pain.   Eyes: Negative for pain.  Respiratory: Negative for chest tightness and shortness of breath.   Cardiovascular: Negative for chest pain, palpitations and leg swelling.  Gastrointestinal: Negative for abdominal pain.  Genitourinary: Negative for dysuria.   Objective:  BP 122/60 (BP Location: Left Arm, Patient Position: Sitting, Cuff Size: Normal)   Pulse 97   Temp 97.6 F (36.4 C) (Temporal)   Ht 5\' 6"  (1.676 m)   Wt 213 lb (96.6 kg)   LMP 11/17/2012   SpO2 99%   BMI 34.38 kg/m   Wt Readings from Last 3 Encounters:  11/06/20 213 lb (96.6 kg)  10/28/20 212 lb 6.4 oz (96.3 kg)  10/23/20 209 lb 8 oz (  95 kg)      Physical Exam Constitutional:      General: She is not in acute distress.Vital signs are normal.     Appearance: Normal appearance. She is well-developed and well-nourished. She is not ill-appearing or toxic-appearing.  HENT:     Head: Normocephalic.     Right Ear: Hearing, tympanic membrane, ear canal and external ear normal. Tympanic membrane is not erythematous, retracted or bulging.     Left Ear: Hearing, tympanic membrane, ear canal and external ear normal. Tympanic membrane is not erythematous, retracted or bulging.     Nose: No mucosal edema or rhinorrhea.     Right Sinus: No maxillary sinus tenderness or frontal sinus tenderness.     Left Sinus: No maxillary sinus tenderness or frontal sinus  tenderness.     Mouth/Throat:     Mouth: Oropharynx is clear and moist and mucous membranes are normal.     Pharynx: Uvula midline.  Eyes:     General: Lids are normal. Lids are everted, no foreign bodies appreciated.     Extraocular Movements: EOM normal.     Conjunctiva/sclera: Conjunctivae normal.     Pupils: Pupils are equal, round, and reactive to light.  Neck:     Thyroid: No thyroid mass or thyromegaly.     Vascular: No carotid bruit.     Trachea: Trachea normal.  Cardiovascular:     Rate and Rhythm: Normal rate and regular rhythm.     Pulses: Normal pulses and intact distal pulses.     Heart sounds: Normal heart sounds, S1 normal and S2 normal. No murmur heard. No friction rub. No gallop.   Pulmonary:     Effort: Pulmonary effort is normal. No tachypnea or respiratory distress.     Breath sounds: Normal breath sounds. No decreased breath sounds, wheezing, rhonchi or rales.  Abdominal:     General: Bowel sounds are normal.     Palpations: Abdomen is soft.     Tenderness: There is no abdominal tenderness.  Musculoskeletal:     Cervical back: Normal range of motion and neck supple.  Skin:    General: Skin is warm, dry and intact.     Findings: No rash.     Comments: No current rash but has new moles on face and under breasts.. SKs  Neurological:     Mental Status: She is alert.  Psychiatric:        Mood and Affect: Mood is not anxious or depressed.        Speech: Speech normal.        Behavior: Behavior normal. Behavior is cooperative.        Thought Content: Thought content normal.        Cognition and Memory: Cognition and memory normal.        Judgment: Judgment normal.       Results for orders placed or performed in visit on 10/23/20  HgB A1c  Result Value Ref Range   Hemoglobin A1C 6.2 (A) 4.0 - 5.6 %   HbA1c POC (<> result, manual entry)     HbA1c, POC (prediabetic range)     HbA1c, POC (controlled diabetic range)      This visit occurred during the  SARS-CoV-2 public health emergency.  Safety protocols were in place, including screening questions prior to the visit, additional usage of staff PPE, and extensive cleaning of exam room while observing appropriate contact time as indicated for disinfecting solutions.   COVID 19 screen:  No  recent travel or known exposure to [place] The patient denies respiratory symptoms of COVID 19 at this time. The importance of social distancing was discussed today.   Assessment and Plan    Problem List Items Addressed This Visit    Skin rash - Primary     Chemo or steroid related.   Will have her start antihistmine, daily moisturizing cream and can use triamcinolone for itching prn. referr a to derm for recommendations.      Relevant Orders   Ambulatory referral to Dermatology      Meds ordered this encounter  Medications  . triamcinolone (KENALOG) 0.1 %    Sig: Apply 1 application topically 2 (two) times daily.    Dispense:  30 g    Refill:  0     Eliezer Lofts, MD

## 2020-11-06 NOTE — Patient Instructions (Addendum)
Start bedtime Zyrtec antihistamine.  Can use topical steroid cream twice daily as needed.  Start cetaphil cream  for moisturizer.

## 2020-11-06 NOTE — Therapy (Signed)
[place], [place], [address] Phone: [phone]   Fax:  [phone]  Physical Therapy Evaluation  Patient Details  Name: Rachel Rivas MRN: [id number] Date of Birth: 03-18-1961 Referring Provider (PT): Dr. Alvy Bimler    Encounter Date: 11/06/2020   PT End of Session - 11/06/20 1340    Visit Number 1    Number of Visits 13    PT Start Time 0800    PT Stop Time 0845    PT Time Calculation (min) 45 min    Activity Tolerance Patient tolerated treatment well    Behavior During Therapy [facility] for tasks assessed/performed           Past Medical History:  Diagnosis Date  . Allergy    occasionally  . Anemia    yrs ago, low platelets  . Arthritis    RA  . Blood transfusion without reported diagnosis    1985 with c section  . Borderline diabetic   . Dyspnea    While taking Megace  . GERD (gastroesophageal reflux disease)   . Hypertension   . Obstructive sleep apnea syndrome 01/07/2020  . Thrombocytopenia ([place])     Past Surgical History:  Procedure Laterality Date  . CESAREAN SECTION    . COLONOSCOPY    . IR IMAGING GUIDED PORT INSERTION  05/22/2020  . MYOMECTOMY N/A 04/07/2020   Procedure: VAGINAL MYOMECTOMY;  Surgeon: Princess Bruins, MD;  Location: [facility];  Service: Gynecology;  Laterality: N/A;  . POLYPECTOMY    . ROBOTIC ASSISTED TOTAL HYSTERECTOMY WITH BILATERAL SALPINGO OOPHERECTOMY Bilateral 04/07/2020   Procedure: XI ROBOTIC TOTAL LAPAROSCOPIC HYSTERECTOMY, BILATERAL SALPINGO OOPHORECTOMY;  Surgeon: Princess Bruins, MD;  Location: [place];  Service: Gynecology;  Laterality: Bilateral;  . STERILIZATION    . TUBAL LIGATION      There were no vitals filed for this visit.    Subjective Assessment - 11/06/20 0804    Subjective Pt said she had her last treatment on 10/22.  She is still having side effects from chemo including eye redness, skin red  bumps that pop out just anywhere ( pt going to PCP today ) She feels that she had decreased general strength and fatigue with low energy. She thinks the chemo bothered the sciatic nerve at her back and she has pressure in her back. She does not have a big issue with numbness and tingling in her hands and feet. "my legs are weak" She has not been able to walk as much as she wants to    Pertinent History diagnosis of uterine cancer with laparoscopic hysterectomy 04/07/2020( all cancer was removed) she had chemotherapy until 09/11/2020. She did not have radiation. She had problem with weight gain ( 15 pounds) and blood sugar regulation with the chemo    Limitations Walking;Standing;Lifting;House hold activities    How long can you sit comfortably? back pain after 30 minutes, stiffness when she gets up with pain into hips    How long can you stand comfortably? about 15 minutes limited with back pain that sometimes shoots down her legs    How long can you walk comfortably? about 10-15 minutes limited by back pain and leg weakness    Patient Stated Goals to build strength back up so I can function    Currently in Pain? Yes    Pain Score 5     Pain Location Back    Pain Orientation Mid    Pain Descriptors /  Indicators Tightness    Pain Type Chronic pain    Pain Radiating Towards down both lateral hips to below knees, with tingling    Pain Onset More than a month ago    Pain Frequency Intermittent    Aggravating Factors  sitting, standing, walking    Pain Relieving Factors change position and rest    Effect of Pain on Daily Activities limits daily activites.              [facility] PT Assessment - 11/06/20 0001      Assessment   Medical Diagnosis uterine cancer     Referring Provider (PT) Dr. Alvy Bimler     Onset Date/Surgical Date 04/07/20    Hand Dominance Right      Precautions   Precautions Fall      Restrictions   Weight Bearing Restrictions No      Balance Screen   Has the patient fallen  in the past 6 months No    Has the patient had a decrease in activity level because of a fear of falling?  No    Is the patient reluctant to leave their home because of a fear of falling?  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    Leisure walks 10-15 minutes daily       Cognition   Overall Cognitive Status Within Functional Limits for tasks assessed      Observation/Other Assessments   Observations Pt appears to be deconditioned and walks with difficutly      Sensation   Additional Comments --      Coordination   Gross Motor Movements are Fluid and Coordinated No   appears to have difficulty standing erect     Functional Tests   Functional tests Sit to Stand      Sit to Stand   Comments 9 reps in 30 seconds   increased pain in back and knees and short of breath     Posture/Postural Control   Posture/Postural Control Postural limitations    Postural Limitations Rounded Shoulders;Forward head;Increased lumbar lordosis;Anterior pelvic tilt;Flexed trunk      ROM / Strength   AROM / PROM / Strength AROM;Strength      AROM   Overall AROM  Deficits    Overall AROM Comments tight tender  low back with forward flexion, after a few reps, legs feels really weak      Strength   Overall Strength Deficits    Overall Strength Comments pt has at least 4/5 in all groups to isometric testing but had decreased strength with movment due to pain    Right Hand Grip (lbs) 51/55/58    Left Hand Grip (lbs) 55/40/45      Flexibility   Soft Tissue Assessment /Muscle  Length yes   extremely tight lumbar paraspinals and hip flexors   Hamstrings --      Palpation   Palpation comment extremely tight paraspinals with no movment with lumbar spine with forward flexion, it is  all at hip      Ambulation/Gait   Ambulation/Gait Yes    Ambulation/Gait Assistance 7: Independent    Assistive device None      Standardized Balance Assessment   Standardized Balance Assessment Timed Up and Go Test      Timed Up and Go Test   Normal TUG (seconds) 9.37                      Objective measurements completed on examination: See above findings.       [facility] Adult PT Treatment/Exercise - 11/06/20 0001      Moist Heat Therapy   Number Minutes Moist Heat 10 Minutes    Moist Heat Location Lumbar Spine                    PT Short Term Goals - 11/06/20 1341      PT SHORT TERM GOAL #1   Title Pt will be indpendent in a HEP for low back and hip streching to help with back pain    Time 4    Period Weeks    Status New             PT Long Term Goals - 11/06/20 0841      PT LONG TERM GOAL #1   Title Pt will be independent in a home exercise program for general strength and endurance so that she will be able to continue post discharge    Time 6    Period Weeks    Status New      PT LONG TERM GOAL #2   Title Pt will maintain ablitiy to do 13  repetitions of sit to stand in 30 seconds after 4 rounds of chemotherapy    Baseline 9 reps on 11/06/2020    Time 6    Period Weeks    Status New      PT LONG TERM GOAL #3   Title Pt will perform TUG in < 7 seconds    Baseline 9.37 on 11/06/2020    Time 6    Period Weeks    Status New      PT LONG TERM GOAL #4   Title Pt would like to be able to get back pain to < 2 and will know how to manage it    Time 6    Period Weeks    Status New      PT LONG TERM GOAL #5   Title Pt wants to be able to walk 30 minutes without stopping or having increased back pain so she can return to daily  activities    Time 6    Period Weeks    Status New      Additional Long Term Goals   Additional Long Term Goals Yes      PT LONG TERM GOAL #6   Title Pt wil be able to go up and a flight of steps using one hand rail with only mild shortness of breath and no increase in back pain so she can get the second floor of her her building without difficutly    Time 6    Period Weeks    Status New                  Plan - 11/06/20 1336    Clinical Impression Statement Pt returns after completion of chemotherapy with deconditioning and back pain and tightness.  She has decreaed endurance with  increased back pain/weakness with minor activity .  She has decreaed in her functional outcome measures that were taken in July at the start of chemo and wants to regain her function to that level    Personal Factors and Comorbidities Comorbidity 3+    Comorbidities history of low back pain,  laparoscopic surgery,chemotherapy    Stability/Clinical Decision Making Stable/Uncomplicated    Clinical Decision Making Low    Rehab Potential Good    PT Frequency 2x / week    PT Duration 6 weeks   delayed start of therapy   PT Treatment/Interventions ADLs/Self Care Home Management;Moist Heat;Therapeutic activities;Functional mobility training;Therapeutic exercise;Patient/family education;Manual techniques;Passive range of motion    PT Next Visit Plan consider use of moist heat to low back to assist with flexibiity and stretching, progress strenghtening to LE, core UE, consider use of nustep or bike for endurance    Consulted and Agree with Plan of Care Patient           Patient will benefit from skilled therapeutic intervention in order to improve the following deficits and impairments:  Increased fascial restricitons,Impaired flexibility,Impaired perceived functional ability,Decreased range of motion,Increased muscle spasms,Decreased endurance,Difficulty walking,Decreased strength,Pain,Postural  dysfunction  Visit Diagnosis: Other symptoms and signs involving the musculoskeletal system - Plan: PT plan of care cert/re-cert, CANCELED: PT plan of care cert/re-cert  Unspecified disturbances of skin sensation - Plan: PT plan of care cert/re-cert, CANCELED: PT plan of care cert/re-cert  Muscle weakness (generalized) - Plan: PT plan of care cert/re-cert, CANCELED: PT plan of care cert/re-cert     Problem List Patient Active Problem List   Diagnosis Date Noted  . Skin rash 11/06/2020  . Left otitis media 10/23/2020  . Elevated glucose 10/23/2020  . Class II obesity 10/13/2020  . Physical debility 10/13/2020  . ETD (Eustachian tube dysfunction), left 09/25/2020  . Immunocompromised state due to drug therapy ([place]) 09/25/2020  . Anemia due to antineoplastic chemotherapy 09/11/2020  . Drug-induced hyperglycemia 08/21/2020  . Acne 08/21/2020  . Asthma 07/31/2020  . Other constipation 07/23/2020  . Preventive measure 07/23/2020  . Pancytopenia, acquired ([place]) 06/16/2020  . Bone pain 06/03/2020  . Generalized weakness 06/03/2020  . Peripheral neuropathy due to chemotherapy ([place]) 06/03/2020  . Malignant cachexia ([place]) 06/03/2020  . Dysuria 06/01/2020  . Liver lesion 05/13/2020  . Goals of care, counseling/discussion 05/13/2020  . Adenosarcoma of body of uterus ([place]) 04/24/2020  . Post-operative state 04/07/2020  . Postoperative state 04/07/2020  . Post-menopausal bleeding 03/26/2020  . Family history of cancer in mother 03/03/2020  . Grade 2 ankle sprain, left, initial encounter 01/07/2020  . Obstructive sleep apnea syndrome 01/07/2020  . Former tobacco use 11/05/2019  . Class 2 severe obesity due to excess calories with serious comorbidity and body mass index (BMI) of 36.0 to 36.9 in adult [facility]) 11/05/2019  . Shortness of breath 10/28/2019  . Gastroesophageal reflux disease 10/28/2019  . Lateral epicondylitis of right elbow 03/21/2019  . Abnormal CXR 02/21/2019  .  Persistent cough for 3 weeks or longer 02/13/2019  . BPPV (benign paroxysmal positional vertigo), right 05/31/2018  . ETD (Eustachian tube dysfunction), bilateral 03/23/2018  . Atopic dermatitis 02/28/2017  . Seborrheic keratoses, inflamed 02/28/2017  . Rheumatoid arthritis ([place]) 03/06/2015  . Thrombocytopenia ([place]) 02/26/2015  . Prediabetes 11/04/2014  . Acute sinus infection 05/26/2014  . Essential hypertension    Donato Heinz. Owens Shark, PT  [place] 11/06/2020, 1:51 PM  [place],  [place], [address] Phone: [phone]   Fax:  [phone]  Name: Rachel Rivas MRN: [id number] Date of Birth: Feb 24, 1961

## 2020-11-06 NOTE — Assessment & Plan Note (Addendum)
Chemo or steroid related.   Will have her start antihistmine, daily moisturizing cream and can use triamcinolone for itching prn. referr a to derm for recommendations.

## 2020-11-24 ENCOUNTER — Ambulatory Visit: Payer: 59

## 2020-11-25 ENCOUNTER — Ambulatory Visit: Payer: PRIVATE HEALTH INSURANCE | Admitting: Cardiology

## 2020-11-27 ENCOUNTER — Encounter: Payer: Self-pay | Admitting: Physical Therapy

## 2020-11-27 ENCOUNTER — Ambulatory Visit: Payer: 59 | Attending: Hematology and Oncology | Admitting: Physical Therapy

## 2020-11-27 ENCOUNTER — Other Ambulatory Visit: Payer: Self-pay

## 2020-11-27 DIAGNOSIS — R209 Unspecified disturbances of skin sensation: Secondary | ICD-10-CM | POA: Insufficient documentation

## 2020-11-27 DIAGNOSIS — R29898 Other symptoms and signs involving the musculoskeletal system: Secondary | ICD-10-CM | POA: Insufficient documentation

## 2020-11-27 DIAGNOSIS — Z483 Aftercare following surgery for neoplasm: Secondary | ICD-10-CM | POA: Insufficient documentation

## 2020-11-27 DIAGNOSIS — M6281 Muscle weakness (generalized): Secondary | ICD-10-CM | POA: Diagnosis present

## 2020-11-27 NOTE — Therapy (Signed)
[place], [place], [address] Phone: [phone]   Fax:  [phone]  Physical Therapy Treatment  Patient Details  Name: Rachel Rivas MRN: [id number] Date of Birth: [birth year] Referring Provider (PT): Dr. Alvy Bimler    Encounter Date: 11/27/2020   PT End of Session - 11/27/20 1107    Visit Number 2    Number of Visits 13    Date for PT Re-Evaluation 12/21/20    PT Start Time 1000    PT Stop Time 1045    PT Time Calculation (min) 45 min    Activity Tolerance Patient tolerated treatment well    Behavior During Therapy [facility] for tasks assessed/performed           Past Medical History:  Diagnosis Date  . Allergy    occasionally  . Anemia    yrs ago, low platelets  . Arthritis    RA  . Blood transfusion without reported diagnosis    1985 with c section  . Borderline diabetic   . Dyspnea    While taking Megace  . GERD (gastroesophageal reflux disease)   . Hypertension   . Obstructive sleep apnea syndrome 01/07/2020  . Thrombocytopenia ([place])     Past Surgical History:  Procedure Laterality Date  . CESAREAN SECTION    . COLONOSCOPY    . IR IMAGING GUIDED PORT INSERTION  05/22/2020  . MYOMECTOMY N/A 04/07/2020   Procedure: VAGINAL MYOMECTOMY;  Surgeon: Princess Bruins, MD;  Location: [facility];  Service: Gynecology;  Laterality: N/A;  . POLYPECTOMY    . ROBOTIC ASSISTED TOTAL HYSTERECTOMY WITH BILATERAL SALPINGO OOPHERECTOMY Bilateral 04/07/2020   Procedure: XI ROBOTIC TOTAL LAPAROSCOPIC HYSTERECTOMY, BILATERAL SALPINGO OOPHORECTOMY;  Surgeon: Princess Bruins, MD;  Location: [place];  Service: Gynecology;  Laterality: Bilateral;  . STERILIZATION    . TUBAL LIGATION      There were no vitals filed for this visit.   Subjective Assessment - 11/27/20 1013    Subjective Pt has been doing better, but still has days where she is more tired than others.  She  plans to go to work at the end of the month.  She has been going to shopping places and walking around inside the stores    Pertinent History diagnosis of uterine cancer with laparoscopic hysterectomy 04/07/2020( all cancer was removed) she had chemotherapy until 09/11/2020. She did not have radiation. She had problem with weight gain ( 15 pounds) and blood sugar regulation with the chemo    Limitations Walking;Standing;Lifting;House hold activities    How long can you sit comfortably? back pain after 30 minutes, stiffness when she gets up with pain into hips    Currently in Pain? Yes    Pain Score 5     Pain Location Back    Pain Orientation Mid    Pain Descriptors / Indicators --   stifness                            [facility] Adult PT Treatment/Exercise - 11/27/20 0001      Balance Poses: Yoga   Warrior I 2 reps;15 seconds   at counter top, slowly with no hands only lunge  then one arm up, then two arms up with slow deep breaths,     Exercises   Exercises Shoulder;Knee/Hip;Lumbar      Lumbar Exercises: Stretches   Active Hamstring Stretch Right;Left    Active  Hamstring Stretch Limitations in sitting, with dorsi/plantar flexion, did not do head bend due to history of "inner ear"    Lower Trunk Rotation 5 reps    Pelvic Tilt 5 reps    Other Lumbar Stretch Exercise modified downward dog on counter top alternating with return to upright cueing not to go into too much lumbar lordosis for spinal mobility      Knee/Hip Exercises: Seated   Sit to Sand 3 sets;without UE support   5 reps each with each leg back, and then both legs equal with elevated mat height     Knee/Hip Exercises: Sidelying   Hip ABduction AROM;Right;Left;10 reps    Hip ABduction Limitations more difficulty with cramping with leg leg    Clams 5 reps with difficutly with left leg    Other Sidelying Knee/Hip Exercises knee to chest in plane of hip, pt did 5 reps with right leg but not able to do with left  leg      Shoulder Exercises: Supine   Protraction 10 reps;Both;AROM      Shoulder Exercises: Seated   Other Seated Exercises neck, upper thoracic and scapular ROM with deep breathing      Shoulder Exercises: Sidelying   ABduction AROM;Right;Left;5 reps   with deep breaths for stretch into back and sidebody     Moist Heat Therapy   Number Minutes Moist Heat 10 Minutes    Moist Heat Location Lumbar Spine      Manual Therapy   Manual Therapy Myofascial release    Myofascial Release briefly, to low back, tissue is not as tight as last visit                    PT Short Term Goals - 11/06/20 1341      PT SHORT TERM GOAL #1   Title Pt will be indpendent in a HEP for low back and hip streching to help with back pain    Time 4    Period Weeks    Status New             PT Long Term Goals - 11/06/20 0841      PT LONG TERM GOAL #1   Title Pt will be independent in a home exercise program for general strength and endurance so that she will be able to continue post discharge    Time 6    Period Weeks    Status New      PT LONG TERM GOAL #2   Title Pt will maintain ablitiy to do 13  repetitions of sit to stand in 30 seconds after 4 rounds of chemotherapy    Baseline 9 reps on 11/06/2020    Time 6    Period Weeks    Status New      PT LONG TERM GOAL #3   Title Pt will perform TUG in < 7 seconds    Baseline 9.37 on 11/06/2020    Time 6    Period Weeks    Status New      PT LONG TERM GOAL #4   Title Pt would like to be able to get back pain to < 2 and will know how to manage it    Time 6    Period Weeks    Status New      PT LONG TERM GOAL #5   Title Pt wants to be able to walk 30 minutes without stopping or having increased back pain so she  can return to daily activities    Time 6    Period Weeks    Status New      Additional Long Term Goals   Additional Long Term Goals Yes      PT LONG TERM GOAL #6   Title Pt wil be able to go up and a flight of steps  using one hand rail with only mild shortness of breath and no increase in back pain so she can get the second floor of her her building without difficutly    Time 6    Period Weeks    Status New                 Plan - 11/27/20 1107    Clinical Impression Statement Pt reports she is slowly getting better, but still has days of fatigue and times when both her hips hurt while she is walking and she has to sit down.  She had more pain and diffiiculty with exercises with left leg today. She is unable to put head of mat all the way down due to "inner ear" problems and has difficulty with rolling on the mat for same reason, so will focus on standing strenghening and increasing endurance as well as stretching to low back    Personal Factors and Comorbidities Comorbidity 3+    Comorbidities history of low back pain,  laparoscopic surgery,chemotherapy    Stability/Clinical Decision Making Stable/Uncomplicated    Rehab Potential Good    PT Frequency 2x / week    PT Duration 6 weeks    PT Treatment/Interventions ADLs/Self Care Home Management;Moist Heat;Therapeutic activities;Functional mobility training;Therapeutic exercise;Patient/family education;Manual techniques;Passive range of motion    PT Next Visit Plan consider use of moist heat to low back to assist with flexibiity and stretching, progress strenghtening to LE, core UE, consider use of nustep or bike for endurance( at end of session when back mobility is "warmed up"   continue with standing warrior streches and modified down ward dog on the wall for spinal mobility and balance    Consulted and Agree with Plan of Care Patient           Patient will benefit from skilled therapeutic intervention in order to improve the following deficits and impairments:  Increased fascial restricitons,Impaired flexibility,Impaired perceived functional ability,Decreased range of motion,Increased muscle spasms,Decreased endurance,Difficulty walking,Decreased  strength,Pain,Postural dysfunction  Visit Diagnosis: Other symptoms and signs involving the musculoskeletal system  Unspecified disturbances of skin sensation  Muscle weakness (generalized)  Aftercare following surgery for neoplasm     Problem List Patient Active Problem List   Diagnosis Date Noted  . Skin rash 11/06/2020  . Left otitis media 10/23/2020  . Elevated glucose 10/23/2020  . Class II obesity 10/13/2020  . Physical debility 10/13/2020  . ETD (Eustachian tube dysfunction), left 09/25/2020  . Immunocompromised state due to drug therapy ([place]) 09/25/2020  . Anemia due to antineoplastic chemotherapy 09/11/2020  . Drug-induced hyperglycemia 08/21/2020  . Acne 08/21/2020  . Asthma 07/31/2020  . Other constipation 07/23/2020  . Preventive measure 07/23/2020  . Pancytopenia, acquired ([place]) 06/16/2020  . Bone pain 06/03/2020  . Generalized weakness 06/03/2020  . Peripheral neuropathy due to chemotherapy ([place]) 06/03/2020  . Malignant cachexia ([place]) 06/03/2020  . Dysuria 06/01/2020  . Liver lesion 05/13/2020  . Goals of care, counseling/discussion 05/13/2020  . Adenosarcoma of body of uterus ([place]) 04/24/2020  . Post-operative state 04/07/2020  . Postoperative state 04/07/2020  . Post-menopausal bleeding 03/26/2020  . Family history  of cancer in mother 03/03/2020  . Grade 2 ankle sprain, left, initial encounter 01/07/2020  . Obstructive sleep apnea syndrome 01/07/2020  . Former tobacco use 11/05/2019  . Class 2 severe obesity due to excess calories with serious comorbidity and body mass index (BMI) of 36.0 to 36.9 in adult [facility]) 11/05/2019  . Shortness of breath 10/28/2019  . Gastroesophageal reflux disease 10/28/2019  . Lateral epicondylitis of right elbow 03/21/2019  . Abnormal CXR 02/21/2019  . Persistent cough for 3 weeks or longer 02/13/2019  . BPPV (benign paroxysmal positional vertigo), right 05/31/2018  . ETD (Eustachian tube dysfunction), bilateral  03/23/2018  . Atopic dermatitis 02/28/2017  . Seborrheic keratoses, inflamed 02/28/2017  . Rheumatoid arthritis ([place]) 03/06/2015  . Thrombocytopenia ([place]) 02/26/2015  . Prediabetes 11/04/2014  . Acute sinus infection 05/26/2014  . Essential hypertension    Donato Heinz. Owens Shark PT  Norwood Levo 11/27/2020, 11:15 AM  [place], [place], [address] Phone: [phone]   Fax:  [phone]  Name: Rachel Rivas MRN: [id number] Date of Birth: 24-Apr-1961

## 2020-12-01 ENCOUNTER — Ambulatory Visit: Payer: 59

## 2020-12-02 ENCOUNTER — Other Ambulatory Visit: Payer: Self-pay

## 2020-12-02 ENCOUNTER — Ambulatory Visit: Payer: 59

## 2020-12-02 DIAGNOSIS — Z483 Aftercare following surgery for neoplasm: Secondary | ICD-10-CM

## 2020-12-02 DIAGNOSIS — R209 Unspecified disturbances of skin sensation: Secondary | ICD-10-CM

## 2020-12-02 DIAGNOSIS — R29898 Other symptoms and signs involving the musculoskeletal system: Secondary | ICD-10-CM | POA: Diagnosis not present

## 2020-12-02 DIAGNOSIS — M6281 Muscle weakness (generalized): Secondary | ICD-10-CM

## 2020-12-02 NOTE — Therapy (Signed)
[place], [place], [address] Phone: [phone]   Fax:  [phone]  Physical Therapy Treatment  Patient Details  Name: Rachel Rivas MRN: [id number] Date of Birth: 08-Sep-1961 Referring Provider (PT): Dr. Alvy Bimler    Encounter Date: 12/02/2020   PT End of Session - 12/02/20 1200    Visit Number 3    Number of Visits 13    Date for PT Re-Evaluation 12/21/20    PT Start Time 1107    PT Stop Time 1203    PT Time Calculation (min) 56 min    Activity Tolerance Patient tolerated treatment well    Behavior During Therapy [facility] for tasks assessed/performed           Past Medical History:  Diagnosis Date  . Allergy    occasionally  . Anemia    yrs ago, low platelets  . Arthritis    RA  . Blood transfusion without reported diagnosis    1985 with c section  . Borderline diabetic   . Dyspnea    While taking Megace  . GERD (gastroesophageal reflux disease)   . Hypertension   . Obstructive sleep apnea syndrome 01/07/2020  . Thrombocytopenia ([place])     Past Surgical History:  Procedure Laterality Date  . CESAREAN SECTION    . COLONOSCOPY    . IR IMAGING GUIDED PORT INSERTION  05/22/2020  . MYOMECTOMY N/A 04/07/2020   Procedure: VAGINAL MYOMECTOMY;  Surgeon: Princess Bruins, MD;  Location: [facility];  Service: Gynecology;  Laterality: N/A;  . POLYPECTOMY    . ROBOTIC ASSISTED TOTAL HYSTERECTOMY WITH BILATERAL SALPINGO OOPHERECTOMY Bilateral 04/07/2020   Procedure: XI ROBOTIC TOTAL LAPAROSCOPIC HYSTERECTOMY, BILATERAL SALPINGO OOPHORECTOMY;  Surgeon: Princess Bruins, MD;  Location: [place];  Service: Gynecology;  Laterality: Bilateral;  . STERILIZATION    . TUBAL LIGATION      There were no vitals filed for this visit.   Subjective Assessment - 12/02/20 1109    Subjective Been a little more active the past few days so my back is acting up more today. And when  I woke up I felt good and thought I had more energy but started to feel more tired throughout the morning.    Pertinent History diagnosis of uterine cancer with laparoscopic hysterectomy 04/07/2020( all cancer was removed) she had chemotherapy until 09/11/2020. She did not have radiation. She had problem with weight gain ( 15 pounds) and blood sugar regulation with the chemo    Patient Stated Goals to build strength back up so I can function    Currently in Pain? Yes    Pain Score 6     Pain Location Back    Pain Orientation Mid    Pain Descriptors / Indicators Aching;Dull    Pain Type Chronic pain    Pain Onset More than a month ago    Pain Frequency Intermittent    Aggravating Factors  being more active with standing and walking, but able to do this longer    Pain Relieving Factors stretch and rest                             [facility] Adult PT Treatment/Exercise - 12/02/20 0001      Exercises   Other Exercises  At counter for heel-toe walking with cuing for abdominal engagment at counter for fingertip support, 2x, 6 ft      Lumbar  Exercises: Stretches   Active Hamstring Stretch Right;Left;3 reps;20 seconds   supine with towel at ball of foot   Active Hamstring Stretch Limitations pt on moist heat for all of supine activities    Single Knee to Chest Stretch Right;Left;3 reps;20 seconds    Lower Trunk Rotation 5 reps    Figure 4 Stretch 3 reps;20 seconds   supine   Figure 4 Stretch Limitations then trial of seated 1x each side, pt reported some pain but due to tightness, not sharp pain      Lumbar Exercises: Supine   Pelvic Tilt 10 reps   posterior tilt   Clam 10 reps   2 sets of 5 with pelvic tilt   Bent Knee Raise 10 reps   2 sets of 5 with pelvic tilt     Knee/Hip Exercises: Aerobic   Nustep Level 6 x5 minutes with therapist present to monitor pts tolerance. She reports feeling challenged by this      Knee/Hip Exercises: Standing   Heel Raises Both;10 reps    then Rt, Lt 5x each at counter for fingertip support   Hip Flexion Stengthening;Right;Left;2 sets;5 sets    Hip Flexion Limitations Pt returning therapist demo for each    Hip Abduction Stengthening;Right;Left;2 sets    Abduction Limitations VCs and demo to decrease hip er    Hip Extension Stengthening;Right;Left;2 sets;5 reps    Extension Limitations VCs to decrease lumbar ext at end motion      Moist Heat Therapy   Number Minutes Moist Heat 10 Minutes   then cont to lay on MHP for supine exs   Moist Heat Location Lumbar Spine                    PT Short Term Goals - 11/06/20 1341      PT SHORT TERM GOAL #1   Title Pt will be indpendent in a HEP for low back and hip streching to help with back pain    Time 4    Period Weeks    Status New             PT Long Term Goals - 11/06/20 0841      PT LONG TERM GOAL #1   Title Pt will be independent in a home exercise program for general strength and endurance so that she will be able to continue post discharge    Time 6    Period Weeks    Status New      PT LONG TERM GOAL #2   Title Pt will maintain ablitiy to do 13  repetitions of sit to stand in 30 seconds after 4 rounds of chemotherapy    Baseline 9 reps on 11/06/2020    Time 6    Period Weeks    Status New      PT LONG TERM GOAL #3   Title Pt will perform TUG in < 7 seconds    Baseline 9.37 on 11/06/2020    Time 6    Period Weeks    Status New      PT LONG TERM GOAL #4   Title Pt would like to be able to get back pain to < 2 and will know how to manage it    Time 6    Period Weeks    Status New      PT LONG TERM GOAL #5   Title Pt wants to be able to walk 30 minutes without stopping or  having increased back pain so she can return to daily activities    Time 6    Period Weeks    Status New      Additional Long Term Goals   Additional Long Term Goals Yes      PT LONG TERM GOAL #6   Title Pt wil be able to go up and a flight of steps using one hand  rail with only mild shortness of breath and no increase in back pain so she can get the second floor of her her building without difficutly    Time 6    Period Weeks    Status New                 Plan - 12/02/20 1200    Clinical Impression Statement Pt came in reporting some increaesd LBP from being more active in past few days. Tolerated back and hip flexibilty well reporting good stretches felt. Some pain with hip stretches but felt like "good pain" mostly due to muscle tightness. Also included standing hip 3 way raises and brief balance exercises. Also progressed pt to NuStep today which she did well with tolerating 5 mins on level 6. Overall pt reports feeling good after session with LBP reducing to 3-4/10 from 6/10 at start of session.    Personal Factors and Comorbidities Comorbidity 3+    Comorbidities history of low back pain,  laparoscopic surgery,chemotherapy    Stability/Clinical Decision Making Stable/Uncomplicated    Rehab Potential Good    PT Frequency 2x / week    PT Duration 6 weeks    PT Treatment/Interventions ADLs/Self Care Home Management;Moist Heat;Therapeutic activities;Functional mobility training;Therapeutic exercise;Patient/family education;Manual techniques;Passive range of motion    PT Next Visit Plan Cont use of moist heat to low back to assist with flexibiity and stretching, progress strenghtening to LE, core UE, Cont nustep for endurance( at end of session when back mobility is "warmed up")  continue with standing warrior stretches and modified down ward dog on the wall for spinal mobility and balance    Consulted and Agree with Plan of Care Patient           Patient will benefit from skilled therapeutic intervention in order to improve the following deficits and impairments:  Increased fascial restricitons,Impaired flexibility,Impaired perceived functional ability,Decreased range of motion,Increased muscle spasms,Decreased endurance,Difficulty  walking,Decreased strength,Pain,Postural dysfunction  Visit Diagnosis: Other symptoms and signs involving the musculoskeletal system  Unspecified disturbances of skin sensation  Muscle weakness (generalized)  Aftercare following surgery for neoplasm     Problem List Patient Active Problem List   Diagnosis Date Noted  . Skin rash 11/06/2020  . Left otitis media 10/23/2020  . Elevated glucose 10/23/2020  . Class II obesity 10/13/2020  . Physical debility 10/13/2020  . ETD (Eustachian tube dysfunction), left 09/25/2020  . Immunocompromised state due to drug therapy ([place]) 09/25/2020  . Anemia due to antineoplastic chemotherapy 09/11/2020  . Drug-induced hyperglycemia 08/21/2020  . Acne 08/21/2020  . Asthma 07/31/2020  . Other constipation 07/23/2020  . Preventive measure 07/23/2020  . Pancytopenia, acquired ([place]) 06/16/2020  . Bone pain 06/03/2020  . Generalized weakness 06/03/2020  . Peripheral neuropathy due to chemotherapy ([place]) 06/03/2020  . Malignant cachexia ([place]) 06/03/2020  . Dysuria 06/01/2020  . Liver lesion 05/13/2020  . Goals of care, counseling/discussion 05/13/2020  . 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 Post-operative state 04/07/2020  . Postoperative state 04/07/2020  . Post-menopausal bleeding 03/26/2020  . Family history  of cancer in mother 03/03/2020  . Grade 2 ankle sprain, left, initial encounter 01/07/2020  . Obstructive sleep apnea syndrome 01/07/2020  . Former tobacco use 11/05/2019  . Class 2 severe obesity due to excess calories with serious comorbidity and body mass index (BMI) of 36.0 to 36.9 in adult [facility]) 11/05/2019  . Shortness of breath 10/28/2019  . Gastroesophageal reflux disease 10/28/2019  . Lateral epicondylitis of right elbow 03/21/2019  . Abnormal CXR 02/21/2019  . Persistent cough for 3 weeks or longer 02/13/2019  . BPPV (benign paroxysmal positional vertigo), right 05/31/2018  . ETD (Eustachian tube dysfunction),  bilateral 03/23/2018  . Atopic dermatitis 02/28/2017  . Seborrheic keratoses, inflamed 02/28/2017  . Rheumatoid arthritis ([place]) 03/06/2015  . Thrombocytopenia ([place]) 02/26/2015  . Prediabetes 11/04/2014  . Acute sinus infection 05/26/2014  . Essential hypertension     Otelia Limes, PTA 12/02/2020, 12:23 PM  [place], [place], [address] Phone: [phone]   Fax:  [phone]  Name: Rachel Rivas MRN: [id number] Date of Birth: December 06, 1960

## 2020-12-04 ENCOUNTER — Ambulatory Visit: Payer: 59 | Admitting: Rehabilitation

## 2020-12-07 ENCOUNTER — Ambulatory Visit: Payer: 59

## 2020-12-08 ENCOUNTER — Inpatient Hospital Stay: Payer: 59

## 2020-12-09 ENCOUNTER — Ambulatory Visit (INDEPENDENT_AMBULATORY_CARE_PROVIDER_SITE_OTHER): Payer: 59

## 2020-12-09 DIAGNOSIS — Z111 Encounter for screening for respiratory tuberculosis: Secondary | ICD-10-CM

## 2020-12-09 NOTE — Progress Notes (Signed)
Per orders of Allie Bossier, AGNP-C, injection of PPD given by Francella Solian in left arm.  Patient tolerated injection well. Patient will make appointment for PPD reading.

## 2020-12-10 ENCOUNTER — Ambulatory Visit: Payer: 59

## 2020-12-10 ENCOUNTER — Other Ambulatory Visit: Payer: Self-pay

## 2020-12-10 DIAGNOSIS — R209 Unspecified disturbances of skin sensation: Secondary | ICD-10-CM

## 2020-12-10 DIAGNOSIS — R29898 Other symptoms and signs involving the musculoskeletal system: Secondary | ICD-10-CM

## 2020-12-10 DIAGNOSIS — Z483 Aftercare following surgery for neoplasm: Secondary | ICD-10-CM

## 2020-12-10 DIAGNOSIS — M6281 Muscle weakness (generalized): Secondary | ICD-10-CM

## 2020-12-10 NOTE — Therapy (Signed)
[place], [place], [address] Phone: [phone]   Fax:  [phone]  Physical Therapy Treatment  Patient Details  Name: Rachel Rivas MRN: [id number] Date of Birth: 09-04-1961 Referring Provider (PT): Dr. Alvy Bimler    Encounter Date: 12/10/2020   PT End of Session - 12/10/20 1023    Visit Number 4    Number of Visits 13    Date for PT Re-Evaluation 12/21/20    PT Start Time 1008   pt arrived late   PT Stop Time 1102    PT Time Calculation (min) 54 min    Activity Tolerance Patient tolerated treatment well    Behavior During Therapy [facility] for tasks assessed/performed           Past Medical History:  Diagnosis Date  . Allergy    occasionally  . Anemia    yrs ago, low platelets  . Arthritis    RA  . Blood transfusion without reported diagnosis    1985 with c section  . Borderline diabetic   . Dyspnea    While taking Megace  . GERD (gastroesophageal reflux disease)   . Hypertension   . Obstructive sleep apnea syndrome 01/07/2020  . Thrombocytopenia ([place])     Past Surgical History:  Procedure Laterality Date  . CESAREAN SECTION    . COLONOSCOPY    . IR IMAGING GUIDED PORT INSERTION  05/22/2020  . MYOMECTOMY N/A 04/07/2020   Procedure: VAGINAL MYOMECTOMY;  Surgeon: Princess Bruins, MD;  Location: [facility];  Service: Gynecology;  Laterality: N/A;  . POLYPECTOMY    . ROBOTIC ASSISTED TOTAL HYSTERECTOMY WITH BILATERAL SALPINGO OOPHERECTOMY Bilateral 04/07/2020   Procedure: XI ROBOTIC TOTAL LAPAROSCOPIC HYSTERECTOMY, BILATERAL SALPINGO OOPHORECTOMY;  Surgeon: Princess Bruins, MD;  Location: [place];  Service: Gynecology;  Laterality: Bilateral;  . STERILIZATION    . TUBAL LIGATION      There were no vitals filed for this visit.   Subjective Assessment - 12/10/20 1010    Subjective Pt reports noticing some increased fatigue after last session but  nothing out of the ordinary. Her LBP has been intermittent but able to do ADLs. Hurting more today due to being more active around house the past few days.    Pertinent History diagnosis of uterine cancer with laparoscopic hysterectomy 04/07/2020( all cancer was removed) she had chemotherapy until 09/11/2020. She did not have radiation. She had problem with weight gain ( 15 pounds) and blood sugar regulation with the chemo    Patient Stated Goals to build strength back up so I can function    Currently in Pain? Yes    Pain Score 7     Pain Location Back    Pain Orientation Lower    Pain Descriptors / Indicators Aching;Dull    Pain Type Chronic pain    Pain Radiating Towards bil legs    Pain Onset More than a month ago    Pain Frequency Intermittent    Aggravating Factors  been doing more around the house    Pain Relieving Factors stretch, rest and heat                             [facility] Adult PT Treatment/Exercise - 12/10/20 0001      Self-Care   Self-Care Other Self-Care Comments    Other Self-Care Comments  Pt begins new job next week so educated her on importance  of incorporating bil hip flexibilty, and educated her in same today, during her long periods of going back to sitting at a desk . Also to take periodic standing stretch and walk breaks as able during the day, and at home as able to help decrease overall tightness she has been feeling in her back as of late. All this was done while supine on heat before beginnnig stretches.      Lumbar Exercises: Stretches   Active Hamstring Stretch Right;Left;3 reps;20 seconds   holding back of knee in sup, then seated   Single Knee to Chest Stretch Right;Left;3 reps;20 seconds    Figure 4 Stretch 2 reps;20 seconds   done in sitting today     Lumbar Exercises: Supine   Pelvic Tilt 10 reps;5 seconds   posterior tilt   Clam 10 reps   holding pelvic tilt   Heel Slides 10 reps   holding pelvic tilt   Heel Slides Limitations  Modified with leg raised about 10 inches off mat for each rep due to difficulty    Bent Knee Raise 10 reps   holding pelvic tilt     Knee/Hip Exercises: Aerobic   Nustep Level 6 x8 minutes monitoring pt throughout      Moist Heat Therapy   Number Minutes Moist Heat 15 Minutes   during self care   Moist Heat Location Lumbar Spine                  PT Education - 12/10/20 1059    Education Details lumbar and bil hip flexibility, core stabs    Person(s) Educated Patient    Methods Explanation;Demonstration;Handout    Comprehension Verbalized understanding;Returned demonstration;Need further instruction            PT Short Term Goals - 11/06/20 1341      PT SHORT TERM GOAL #1   Title Pt will be indpendent in a HEP for low back and hip streching to help with back pain    Time 4    Period Weeks    Status New             PT Long Term Goals - 11/06/20 0841      PT LONG TERM GOAL #1   Title Pt will be independent in a home exercise program for general strength and endurance so that she will be able to continue post discharge    Time 6    Period Weeks    Status New      PT LONG TERM GOAL #2   Title Pt will maintain ablitiy to do 13  repetitions of sit to stand in 30 seconds after 4 rounds of chemotherapy    Baseline 9 reps on 11/06/2020    Time 6    Period Weeks    Status New      PT LONG TERM GOAL #3   Title Pt will perform TUG in < 7 seconds    Baseline 9.37 on 11/06/2020    Time 6    Period Weeks    Status New      PT LONG TERM GOAL #4   Title Pt would like to be able to get back pain to < 2 and will know how to manage it    Time 6    Period Weeks    Status New      PT LONG TERM GOAL #5   Title Pt wants to be able to walk 30 minutes without stopping or  having increased back pain so she can return to daily activities    Time 6    Period Weeks    Status New      Additional Long Term Goals   Additional Long Term Goals Yes      PT LONG TERM GOAL #6    Title Pt wil be able to go up and a flight of steps using one hand rail with only mild shortness of breath and no increase in back pain so she can get the second floor of her her building without difficutly    Time 6    Period Weeks    Status New                 Plan - 12/10/20 1026    Clinical Impression Statement Pt reports still having intermittent LBP and this gets worse when she pushes herself. Spent time educating pt about the importance of rest breaks, while she was on heat. Timing herself when doing activities to make herself go sit down and rest before pain gets too bad. Also progressed HEP to include bil hip and lumbar flexibility along with core stabd. She continues to report feeling looser at end of sesion with less pain. She was able to tolerate longer reps before needinf rest with core stabd today, also able to increase time with NuStep. Rachel Rivas starts a new job next week but is hoping to be able to come for 2 more visits due to her orientation schedule allowing her to be here for 8:00 appts. She will let us know if this will not work, but if able to come plan on making next week last week.    Personal Factors and Comorbidities Comorbidity 3+    Comorbidities history of low back pain,  laparoscopic surgery,chemotherapy    Stability/Clinical Decision Making Stable/Uncomplicated    Rehab Potential Good    PT Frequency 2x / week    PT Duration 6 weeks    PT Treatment/Interventions ADLs/Self Care Home Management;Moist Heat;Therapeutic activities;Functional mobility training;Therapeutic exercise;Patient/family education;Manual techniques;Passive range of motion    PT Next Visit Plan Cont use of moist heat to low back to assist with flexibiity and stretching prn, working towards D/C to HEP after next week due to starting new jo,b progress strenghtening to LE adding standing  bil hip 3 way raises, core UE, Cont nustep for endurance( at end of session when back mobility is "warmed  up") , and try balance HEP as well?    Consulted and Agree with Plan of Care Patient           Patient will benefit from skilled therapeutic intervention in order to improve the following deficits and impairments:  Increased fascial restricitons,Impaired flexibility,Impaired perceived functional ability,Decreased range of motion,Increased muscle spasms,Decreased endurance,Difficulty walking,Decreased strength,Pain,Postural dysfunction  Visit Diagnosis: Other symptoms and signs involving the musculoskeletal system  Unspecified disturbances of skin sensation  Muscle weakness (generalized)  Aftercare following surgery for neoplasm     Problem List Patient Active Problem List   Diagnosis Date Noted  . Skin rash 11/06/2020  . Left otitis media 10/23/2020  . Elevated glucose 10/23/2020  . Class II obesity 10/13/2020  . Physical debility 10/13/2020  . ETD (Eustachian tube dysfunction), left 09/25/2020  . Immunocompromised state due to drug therapy ([place]) 09/25/2020  . Anemia due to antineoplastic chemotherapy 09/11/2020  . Drug-induced hyperglycemia 08/21/2020  . Acne 08/21/2020  . Asthma 07/31/2020  . Other constipation 07/23/2020  . Preventive measure 07/23/2020  .  Pancytopenia, acquired ([place]) 06/16/2020  . Bone pain 06/03/2020  . Generalized weakness 06/03/2020  . Peripheral neuropathy due to chemotherapy ([place]) 06/03/2020  . Malignant cachexia ([place]) 06/03/2020  . Dysuria 06/01/2020  . Liver lesion 05/13/2020  . Goals of care, counseling/discussion 05/13/2020  . 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 Post-operative state 04/07/2020  . Postoperative state 04/07/2020  . Post-menopausal bleeding 03/26/2020  . Family history of cancer in mother 03/03/2020  . Grade 2 ankle sprain, left, initial encounter 01/07/2020  . Obstructive sleep apnea syndrome 01/07/2020  . Former tobacco use 11/05/2019  . Class 2 severe obesity due to excess calories with serious  comorbidity and body mass index (BMI) of 36.0 to 36.9 in adult [facility]) 11/05/2019  . Shortness of breath 10/28/2019  . Gastroesophageal reflux disease 10/28/2019  . Lateral epicondylitis of right elbow 03/21/2019  . Abnormal CXR 02/21/2019  . Persistent cough for 3 weeks or longer 02/13/2019  . BPPV (benign paroxysmal positional vertigo), right 05/31/2018  . ETD (Eustachian tube dysfunction), bilateral 03/23/2018  . Atopic dermatitis 02/28/2017  . Seborrheic keratoses, inflamed 02/28/2017  . Rheumatoid arthritis ([place]) 03/06/2015  . Thrombocytopenia ([place]) 02/26/2015  . Prediabetes 11/04/2014  . Acute sinus infection 05/26/2014  . Essential hypertension     Otelia Limes, PTA 12/10/2020, 12:29 PM  [place], [place], [address] Phone: [phone]   Fax:  [phone]  Name: Rachel Rivas MRN: [id number] Date of Birth: Mar 02, 1961

## 2020-12-10 NOTE — Patient Instructions (Signed)
Knee to Chest    Lying supine, bend involved knee to chest _2-3__ times, __hold 20-30__ seconds. Repeat with other leg. Do _2__ times per day.  Hamstring Stretch    Inhale and straighten spine. Exhale and lean forward toward extended leg. Hold position for _20-30__ seconds. Inhale and come back to center. Repeat with other leg extended. Repeat _2-3__ times, alternating legs. Do _2-3__ times per day.  Lower Trunk Rotation    Bend both knees keeping feet on flat. Rotate from side to side holding stretch on each side for _20-30 sec__ each and tighten abdominals before bringing legs back to other side, keeping knees together and feet on floor. Repeat __2-3__ times per set. Do _2-3___ sessions per day.   Piriformis Stretch, Sitting    Sit, one ankle on opposite knee, same-side hand on crossed knee. Push down on knee, keeping spine straight. Lean torso forward, with flat back, until tension is felt in hamstrings and gluteals of crossed-leg side. Hold __20-30_ seconds.  Repeat _2-3__ times per session. Do _2-3__ sessions per day.  PELVIC TILT: Posterior    Tighten abdominals, flatten low back. _10__ reps per set, holding __5__ seconds.  Now holding pelvic tilt add the following:  1. Open close bent knees like a "clam" 2. Alternate marching 3. Heel slides by alternate straightening legs  Perform each 10 times working to hold pelvic tilt for duration of each set.

## 2020-12-11 ENCOUNTER — Inpatient Hospital Stay: Payer: 59 | Attending: Gynecologic Oncology

## 2020-12-11 ENCOUNTER — Inpatient Hospital Stay: Payer: 59

## 2020-12-11 ENCOUNTER — Other Ambulatory Visit: Payer: Self-pay

## 2020-12-11 ENCOUNTER — Ambulatory Visit: Payer: PRIVATE HEALTH INSURANCE

## 2020-12-11 DIAGNOSIS — Z23 Encounter for immunization: Secondary | ICD-10-CM | POA: Insufficient documentation

## 2020-12-11 DIAGNOSIS — C549 Malignant neoplasm of corpus uteri, unspecified: Secondary | ICD-10-CM

## 2020-12-11 DIAGNOSIS — Z111 Encounter for screening for respiratory tuberculosis: Secondary | ICD-10-CM

## 2020-12-11 LAB — TB SKIN TEST
Induration: 0 mm
TB Skin Test: NEGATIVE

## 2020-12-11 MED ORDER — SODIUM CHLORIDE 0.9% FLUSH
10.0000 mL | Freq: Once | INTRAVENOUS | Status: AC
  Administered 2020-12-11: 10 mL
  Filled 2020-12-11: qty 10

## 2020-12-11 MED ORDER — HEPARIN SOD (PORK) LOCK FLUSH 100 UNIT/ML IV SOLN
250.0000 [IU] | Freq: Once | INTRAVENOUS | Status: AC
  Administered 2020-12-11: 500 [IU]
  Filled 2020-12-11: qty 5

## 2020-12-11 NOTE — Progress Notes (Signed)
PPD Reading Note  PPD read and results entered in EpicCare.  Result: 0 mm induration.  Interpretation: negative  If test not read within 48-72 hours of initial placement, patient advised to repeat in other arm 1-3 weeks after this test.  Allergic reaction: no

## 2020-12-11 NOTE — Patient Instructions (Signed)

## 2020-12-15 ENCOUNTER — Ambulatory Visit: Payer: 59 | Admitting: Physical Therapy

## 2020-12-17 ENCOUNTER — Encounter: Payer: Self-pay | Admitting: Physical Therapy

## 2020-12-28 ENCOUNTER — Telehealth: Payer: Self-pay | Admitting: Family Medicine

## 2020-12-28 DIAGNOSIS — D696 Thrombocytopenia, unspecified: Secondary | ICD-10-CM

## 2020-12-28 DIAGNOSIS — T50905A Adverse effect of unspecified drugs, medicaments and biological substances, initial encounter: Secondary | ICD-10-CM

## 2020-12-28 DIAGNOSIS — R739 Hyperglycemia, unspecified: Secondary | ICD-10-CM

## 2020-12-28 DIAGNOSIS — R7303 Prediabetes: Secondary | ICD-10-CM

## 2020-12-28 NOTE — Telephone Encounter (Signed)
-----   Message from Cloyd Stagers, RT sent at 12/22/2020 11:29 AM EST ----- Regarding: Lab Orders for Wednesday 2.16.2022 Please place lab orders for Wednesday 2.16.2022, office visit for physical on Friday 2.18.2022 Thank you, Dyke Maes RT(R)

## 2020-12-30 ENCOUNTER — Encounter: Payer: Self-pay | Admitting: Gynecologic Oncology

## 2020-12-31 ENCOUNTER — Encounter: Payer: Self-pay | Admitting: Gynecologic Oncology

## 2020-12-31 ENCOUNTER — Other Ambulatory Visit: Payer: Self-pay

## 2020-12-31 ENCOUNTER — Inpatient Hospital Stay: Payer: 59 | Attending: Gynecologic Oncology | Admitting: Gynecologic Oncology

## 2020-12-31 VITALS — BP 125/79 | HR 79 | Temp 97.0°F | Resp 18 | Ht 63.0 in | Wt 211.6 lb

## 2020-12-31 DIAGNOSIS — K219 Gastro-esophageal reflux disease without esophagitis: Secondary | ICD-10-CM | POA: Insufficient documentation

## 2020-12-31 DIAGNOSIS — G4733 Obstructive sleep apnea (adult) (pediatric): Secondary | ICD-10-CM | POA: Diagnosis not present

## 2020-12-31 DIAGNOSIS — C549 Malignant neoplasm of corpus uteri, unspecified: Secondary | ICD-10-CM

## 2020-12-31 DIAGNOSIS — C55 Malignant neoplasm of uterus, part unspecified: Secondary | ICD-10-CM | POA: Insufficient documentation

## 2020-12-31 DIAGNOSIS — I10 Essential (primary) hypertension: Secondary | ICD-10-CM | POA: Diagnosis not present

## 2020-12-31 DIAGNOSIS — Z79899 Other long term (current) drug therapy: Secondary | ICD-10-CM | POA: Diagnosis not present

## 2020-12-31 DIAGNOSIS — Z90722 Acquired absence of ovaries, bilateral: Secondary | ICD-10-CM | POA: Diagnosis not present

## 2020-12-31 DIAGNOSIS — Z90710 Acquired absence of both cervix and uterus: Secondary | ICD-10-CM | POA: Insufficient documentation

## 2020-12-31 DIAGNOSIS — Z9221 Personal history of antineoplastic chemotherapy: Secondary | ICD-10-CM | POA: Diagnosis not present

## 2020-12-31 NOTE — Progress Notes (Signed)
GYNECOLOGIC ONCOLOGY FOLLOW-UP    Patient Name: Rachel Rivas  Patient Age: 60 y.o. Primary Care Provider: [name], MD   Chief Complaint: Chief Complaint  Patient presents with  . 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  S/p adjuvant chemotherapy with 6 cycles of carboplatin and paclitaxel completed October, 2021.  She has no evidence of recurrence on today's exam. I am recommending 3 monthly surveillance examinations with symptom review, physical exam (including pelvic exam).  She will follow-up with Dr Alvy Bimler in 3 months and myself in 6 months. It is reasonable for her to see me in the morning for a visit as she is travelling from [place].  History of Present Illness:  Rachel Rivas is a 60 y.o. y.o. female who is seen in consultation at the request of Dr. Dellis Filbert for an evaluation of incidentally diagnosed adenosarcoma of the uterus.  She developed PMB in early April after going through menopause at approximately age 16. She saw her PCP on 4/13 for bleeding and an ultrasound was obtained showing a thickened endometrial lining and 3-4cm mass within the endocervical canal. She was referred to [facility], started on Megace and ultimately underwent a TRH/BSO on 5/18 for abnormal uterine bleeding with resulting anemia, large prolapsing fibroid.   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   Interval Hx:  The patient saw Dr. Jeral Pinch who ordered a CT chest abdomen and pelvis on May 01, 2020 and this revealed multiple ill-defined low-density liver masses suspicious for metastases, moderate diffuse vaginal wall thickening possibly  postoperative change.  Multidisciplinary tumor board conference convened and she was recommended to undergo MRI evaluation of the liver followed by adjuvant chemotherapy.  MRI of the liver was performed on May 14, 2020 and showed that the 2 dominant right hepatic lobe lesions were most consistent with benign cavernous hemangiomas.  The remaining tiny liver lesions were too small to characterize.  Recommendation was made for follow-up in 3 months.  Between May 29, 2020 and September 11, 2020 patient received 6 cycles of adjuvant carboplatin paclitaxel chemotherapy.  She tolerated therapy well with toxicities mostly of neuropathy and fatigue.  CT abdomen and pelvis on 07/22/2020 showed some irregular soft tissue density at the vaginal cuff that was similar to the prior study.  The hepatic hemangiomas are unchanged favoring a benign etiology.  A post treatment CT scan on 10/12/2020 showed stable appearance of the vaginal cuff, status post hysterectomy likely postsurgical change.  Stable hepatic hemangiomas and no evidence of metastatic disease.  The patient returned for routine surveillance with no symptoms concerning for recurrent disease.  PAST MEDICAL HISTORY:  Past Medical History:  Diagnosis Date  . Allergy    occasionally  . Anemia    yrs ago, low platelets  . Arthritis    RA  . Blood transfusion without reported diagnosis    1985 with c section  . Borderline diabetic   . Dyspnea    While taking Megace  . GERD (gastroesophageal reflux disease)   . Hypertension   . Obstructive sleep apnea syndrome 01/07/2020  . Thrombocytopenia ([place])      PAST SURGICAL HISTORY:  Past Surgical History:  Procedure Laterality Date  . CESAREAN SECTION    .  COLONOSCOPY    . IR IMAGING GUIDED PORT INSERTION  05/22/2020  . MYOMECTOMY N/A 04/07/2020   Procedure: VAGINAL MYOMECTOMY;  Surgeon: Princess Bruins, MD;  Location: [facility];  Service: Gynecology;  Laterality: N/A;  .  POLYPECTOMY    . ROBOTIC ASSISTED TOTAL HYSTERECTOMY WITH BILATERAL SALPINGO OOPHERECTOMY Bilateral 04/07/2020   Procedure: XI ROBOTIC TOTAL LAPAROSCOPIC HYSTERECTOMY, BILATERAL SALPINGO OOPHORECTOMY;  Surgeon: Princess Bruins, MD;  Location: [place];  Service: Gynecology;  Laterality: Bilateral;  . STERILIZATION    . TUBAL LIGATION      OB/GYN HISTORY:  OB History  Gravida Para Term Preterm AB Living  3 2     1 2   SAB IAB Ectopic Multiple Live Births    1          # Outcome Date GA Lbr Len/2nd Weight Sex Delivery Anes PTL Lv  3 IAB           2 Para           1 Para             Patient's last menstrual period was 11/17/2012.  Age at menarche: 56  Age at menopause: 27 Hx of HRT: denies Hx of STDs: no Last pap: 11/2016 History of abnormal pap smears: no  SCREENING STUDIES:  Last mammogram: 01/2020  Last colonoscopy: 2017 Last bone mineral density: 2016  MEDICATIONS: Outpatient Encounter Medications as of 12/31/2020  Medication Sig  . albuterol (VENTOLIN HFA) 108 (90 Base) MCG/ACT inhaler Inhale 2 puffs into the lungs every 6 (six) hours as needed for wheezing or shortness of breath.  . Blood Pressure Monitoring (BLOOD PRESSURE MONITOR AUTOMAT) DEVI Use to check Blood pressures daily  . cyclobenzaprine (FLEXERIL) 10 MG tablet Take 1 tablet (10 mg total) by mouth at bedtime as needed for muscle spasms.  . famotidine (PEPCID) 20 MG tablet TAKE 1 TABLET BY MOUTH TWICE DAILY FOR HEART  . ibuprofen (ADVIL) 800 MG tablet Take 800 mg by mouth 3 (three) times daily.  Marland Kitchen lidocaine-prilocaine (EMLA) cream Apply to affected area once  . losartan-hydrochlorothiazide (HYZAAR) 50-12.5 MG tablet Take 1 tablet by mouth daily.  . predniSONE (DELTASONE) 5 MG tablet Take 5 mg by mouth daily as needed.  . rosuvastatin (CRESTOR) 5 MG tablet Take 1 tablet (5 mg total) by mouth daily.  Marland Kitchen triamcinolone (KENALOG) 0.1 % Apply 1 application topically 2 (two) times daily.   No  facility-administered encounter medications on file as of 12/31/2020.    ALLERGIES:  Allergies  Allergen Reactions  . Latex Itching, Dermatitis and Rash  . Elemental Sulfur Nausea Only     FAMILY HISTORY:  Family History  Problem Relation Age of Onset  . Lung cancer Mother   . Hypertension Mother   . Diabetes Mother   . Uterine cancer Mother   . Cancer Mother        endometrial  . Hypertension Father   . Diabetes Father   . Stroke Father   . Colon cancer Neg Hx   . Colon polyps Neg Hx   . Esophageal cancer Neg Hx   . Rectal cancer Neg Hx   . Stomach cancer Neg Hx      SOCIAL HISTORY:  Social Connections: Not on file    REVIEW OF SYSTEMS:  +Anxiety Denies appetite changes, fevers, chills, fatigue, unexplained weight changes. Denies hearing loss, neck lumps or masses, mouth sores, ringing in ears or voice changes. Denies cough or wheezing.  Denies  shortness of breath. Denies chest pain or palpitations. Denies leg swelling. Denies abdominal distention, blood in stools, constipation, diarrhea, nausea, vomiting, or early satiety. Denies pain with intercourse, dysuria, frequency, hematuria or incontinence. Denies hot flashes, pelvic pain, vaginal bleeding or vaginal discharge.   Denies joint pain, back pain or muscle pain/cramps. Denies itching, rash, or wounds. Denies dizziness, headaches, numbness or seizures. Denies swollen lymph nodes or glands, denies easy bruising or bleeding. Denies depression, confusion, or decreased concentration.  Physical Exam:  Vital Signs for this encounter:  Blood pressure 125/79, pulse 79, temperature (!) 97 F (36.1 C), temperature source Tympanic, resp. rate 18, height 5\' 3"  (1.6 m), weight 211 lb 9.6 oz (96 kg), last menstrual period 11/17/2012, SpO2 100 %. Body mass index is 37.48 kg/m. General: Alert, oriented, no acute distress.  HEENT: Normocephalic, atraumatic. Sclera anicteric.  Chest: no increased WOB Cardiovascular: well  perfused peripheries Abdomen: Obese. Normoactive bowel sounds. Soft, nondistended, nontender to palpation. No masses or hepatosplenomegaly appreciated. No palpable fluid wave.  Incisions soft.  Extremities: Grossly normal range of motion. Warm, well perfused. No edema bilaterally.  Skin: No rashes or lesions.  Lymphatics: No cervical, supraclavicular, or inguinal adenopathy.  GU:  Normal external female genitalia. No lesions. No discharge or bleeding.             Bladder/urethra:  No lesions or masses, well supported bladder             Vagina: smooth, no palpable mass at cuff or beyond.             Cervix/uterus: surgically absent.  Rectal: no nodularity.  Thereasa Solo, MD

## 2020-12-31 NOTE — Patient Instructions (Signed)
Please notify Dr Denman George at phone number [phone] if you notice vaginal bleeding, new pelvic or abdominal pains, bloating, feeling full easy, or a change in bladder or bowel function.   Please have Dr Calton Dach nurse contact Dr Serita Grit office (at [phone]) in May after your visit with her to request an appointment with Dr Denman George for August, 2022. Please ask for an 8:45 visit.

## 2021-01-06 ENCOUNTER — Other Ambulatory Visit (INDEPENDENT_AMBULATORY_CARE_PROVIDER_SITE_OTHER): Payer: 59

## 2021-01-06 ENCOUNTER — Other Ambulatory Visit: Payer: Self-pay

## 2021-01-06 DIAGNOSIS — R7303 Prediabetes: Secondary | ICD-10-CM | POA: Diagnosis not present

## 2021-01-06 LAB — LIPID PANEL
Cholesterol: 163 mg/dL (ref 0–200)
HDL: 76 mg/dL (ref >39.00)
LDL Cholesterol: 73 mg/dL (ref 0–99)
NonHDL: 86.64
Total CHOL/HDL Ratio: 2
Triglycerides: 66 mg/dL (ref 0.0–149.0)
VLDL: 13.2 mg/dL (ref 0.0–40.0)

## 2021-01-06 LAB — COMPREHENSIVE METABOLIC PANEL
ALT: 14 U/L (ref 0–35)
AST: 14 U/L (ref 0–37)
Albumin: 4.2 g/dL (ref 3.5–5.2)
Alkaline Phosphatase: 60 U/L (ref 39–117)
BUN: 14 mg/dL (ref 6–23)
CO2: 30 mEq/L (ref 19–32)
Calcium: 9.4 mg/dL (ref 8.4–10.5)
Chloride: 102 mEq/L (ref 96–112)
Creatinine, Ser: 0.8 mg/dL (ref 0.40–1.20)
GFR: 79.64 mL/min (ref >60.00)
Glucose, Bld: 139 mg/dL — ABNORMAL HIGH (ref 70–99)
Potassium: 3.6 mEq/L (ref 3.5–5.1)
Sodium: 140 mEq/L (ref 135–145)
Total Bilirubin: 0.5 mg/dL (ref 0.2–1.2)
Total Protein: 7.4 g/dL (ref 6.0–8.3)

## 2021-01-06 LAB — HEMOGLOBIN A1C: Hgb A1c MFr Bld: 7.1 % — ABNORMAL HIGH (ref 4.6–6.5)

## 2021-01-06 NOTE — Progress Notes (Signed)
No critical labs need to be addressed urgently. We will discuss labs in detail at upcoming office visit.   

## 2021-01-08 ENCOUNTER — Encounter: Payer: Self-pay | Admitting: Family Medicine

## 2021-01-08 ENCOUNTER — Other Ambulatory Visit: Payer: Self-pay

## 2021-01-08 ENCOUNTER — Ambulatory Visit (INDEPENDENT_AMBULATORY_CARE_PROVIDER_SITE_OTHER): Payer: 59 | Admitting: Family Medicine

## 2021-01-08 VITALS — BP 124/70 | HR 81 | Temp 97.5°F | Ht 63.5 in | Wt 211.5 lb

## 2021-01-08 DIAGNOSIS — D696 Thrombocytopenia, unspecified: Secondary | ICD-10-CM

## 2021-01-08 DIAGNOSIS — G620 Drug-induced polyneuropathy: Secondary | ICD-10-CM

## 2021-01-08 DIAGNOSIS — M069 Rheumatoid arthritis, unspecified: Secondary | ICD-10-CM | POA: Diagnosis not present

## 2021-01-08 DIAGNOSIS — I10 Essential (primary) hypertension: Secondary | ICD-10-CM | POA: Diagnosis not present

## 2021-01-08 DIAGNOSIS — T451X5A Adverse effect of antineoplastic and immunosuppressive drugs, initial encounter: Secondary | ICD-10-CM

## 2021-01-08 DIAGNOSIS — Z0000 Encounter for general adult medical examination without abnormal findings: Secondary | ICD-10-CM

## 2021-01-08 DIAGNOSIS — Z6836 Body mass index (BMI) 36.0-36.9, adult: Secondary | ICD-10-CM

## 2021-01-08 DIAGNOSIS — R739 Hyperglycemia, unspecified: Secondary | ICD-10-CM

## 2021-01-08 DIAGNOSIS — L239 Allergic contact dermatitis, unspecified cause: Secondary | ICD-10-CM

## 2021-01-08 DIAGNOSIS — T50905A Adverse effect of unspecified drugs, medicaments and biological substances, initial encounter: Secondary | ICD-10-CM

## 2021-01-08 DIAGNOSIS — E782 Mixed hyperlipidemia: Secondary | ICD-10-CM

## 2021-01-08 DIAGNOSIS — E2839 Other primary ovarian failure: Secondary | ICD-10-CM

## 2021-01-08 NOTE — Assessment & Plan Note (Signed)
Worsen control in last 3 months.. likely due to prednisone, also poor diet and weight gain.   Encouraged lifestyle change, prednisone avoidance.. will re-eval in 3 months.

## 2021-01-08 NOTE — Assessment & Plan Note (Signed)
Tolerable control. 

## 2021-01-08 NOTE — Progress Notes (Signed)
Patient ID: Rachel Rivas, female    DOB: 04/27/1961, 60 y.o.   MRN: [id number]  This visit was conducted in person.  BP 124/70   Pulse 81   Temp (!) 97.5 F (36.4 C) (Temporal)   Ht 5' 3.5" (1.613 m)   Wt 211 lb 8 oz (95.9 kg)   LMP 11/17/2012   SpO2 96%   BMI 36.88 kg/m    CC:  Chief Complaint  Patient presents with  . Annual Exam    Subjective:   HPI: [name] is a 60 y.o. female presenting on 01/08/2021 for Annual Exam  Currently treated and followed by Dr. Alvy Bimler for adenosarcoma of uterus, s/p TAH and completed  Peripheral neuropathy occurred secondary to chemo She has now gone back to work.  Strength is improving but still fatigued.    Hypertension:   Good control on losartan HCTZ BP Readings from Last 3 Encounters:  01/08/21 124/70  12/31/20 125/79  11/06/20 122/60  Using medication without problems or lightheadedness: none Chest pain with exertion: none Edema: none Short of breath: stable Average home BPs: Other issues:  Skin rash, recurrent... she requests referral to allergist for allergy testing.  Has seen Derm.  RA:  Dr. Amil Amen.. Using prednisone as needed .  Prediabetes ... now in diabetes range but liekly secondary to meds ( recent steroids).  She has taken prednisone 2  times in last month and again in  Lab Results  Component Value Date   HGBA1C 7.1 (H) 01/06/2021   Wt Readings from Last 3 Encounters:  01/08/21 211 lb 8 oz (95.9 kg)  12/31/20 211 lb 9.6 oz (96 kg)  11/06/20 213 lb (96.6 kg)    Thrombocytopenia related to autoimmune etiologiesdiagnosed April 2014.: Eval  by Dr. Clement Husbands ONC.   Elevated Cholesterol:  LDL at goal < 100 on crestor 5 mg daily.. but had achiness in legs.. so stopped. Lab Results  Component Value Date   CHOL 163 01/06/2021   HDL 76.00 01/06/2021   LDLCALC 73 01/06/2021   TRIG 66.0 01/06/2021   CHOLHDL 2 01/06/2021  Using medications without problems: Muscle aches:  Diet compliance:  good Exercise: minimal Other complaints:   Relevant past medical, surgical, family and social history reviewed and updated as indicated. Interim medical history since our last visit reviewed. Allergies and medications reviewed and updated. Outpatient Medications Prior to Visit  Medication Sig Dispense Refill  . albuterol (VENTOLIN HFA) 108 (90 Base) MCG/ACT inhaler Inhale 2 puffs into the lungs every 6 (six) hours as needed for wheezing or shortness of breath. 8 g 2  . Blood Pressure Monitoring (BLOOD PRESSURE MONITOR AUTOMAT) DEVI Use to check Blood pressures daily 1 each 0  . cyclobenzaprine (FLEXERIL) 10 MG tablet Take 1 tablet (10 mg total) by mouth at bedtime as needed for muscle spasms. 15 tablet 0  . famotidine (PEPCID) 20 MG tablet TAKE 1 TABLET BY MOUTH TWICE DAILY FOR HEART 180 tablet 0  . ibuprofen (ADVIL) 800 MG tablet Take 800 mg by mouth 3 (three) times daily.    Marland Kitchen lidocaine-prilocaine (EMLA) cream Apply to affected area once 30 g 3  . losartan-hydrochlorothiazide (HYZAAR) 50-12.5 MG tablet Take 1 tablet by mouth daily. 90 tablet 3  . predniSONE (DELTASONE) 5 MG tablet Take 5 mg by mouth daily as needed.    . rosuvastatin (CRESTOR) 5 MG tablet Take 1 tablet (5 mg total) by mouth daily. 90 tablet 3  . triamcinolone (KENALOG) 0.1 % Apply 1 application  topically 2 (two) times daily. 30 g 0   No facility-administered medications prior to visit.      Per HPI unless specifically indicated in ROS section below Review of Systems  Constitutional: Positive for fatigue. Negative for fever.  HENT: Negative for ear pain.   Eyes: Negative for pain.  Respiratory: Negative for chest tightness and shortness of breath.   Cardiovascular: Negative for chest pain, palpitations and leg swelling.  Gastrointestinal: Negative for abdominal pain.  Genitourinary: Negative for dysuria.   Objective:  BP 124/70   Pulse 81   Temp (!) 97.5 F (36.4 C) (Temporal)   Ht 5' 3.5" (1.613 m)   Wt 211  lb 8 oz (95.9 kg)   LMP 11/17/2012   SpO2 96%   BMI 36.88 kg/m   Wt Readings from Last 3 Encounters:  01/08/21 211 lb 8 oz (95.9 kg)  12/31/20 211 lb 9.6 oz (96 kg)  11/06/20 213 lb (96.6 kg)      Physical Exam Constitutional:      General: She is not in acute distress.Vital signs are normal.     Appearance: Normal appearance. She is well-developed and well-nourished. She is obese. She is not ill-appearing or toxic-appearing.  HENT:     Head: Normocephalic.     Right Ear: Hearing, tympanic membrane, ear canal and external ear normal.     Left Ear: Hearing, tympanic membrane, ear canal and external ear normal.     Nose: Nose normal.  Eyes:     General: Lids are normal. Lids are everted, no foreign bodies appreciated.     Extraocular Movements: EOM normal.     Conjunctiva/sclera: Conjunctivae normal.     Pupils: Pupils are equal, round, and reactive to light.  Neck:     Thyroid: No thyroid mass or thyromegaly.     Vascular: No carotid bruit.     Trachea: Trachea normal.  Cardiovascular:     Rate and Rhythm: Normal rate and regular rhythm.     Pulses: Intact distal pulses.     Heart sounds: Normal heart sounds, S1 normal and S2 normal. No murmur heard. No gallop.   Pulmonary:     Effort: Pulmonary effort is normal. No respiratory distress.     Breath sounds: Normal breath sounds. No wheezing, rhonchi or rales.  Abdominal:     General: Bowel sounds are normal. There is no distension or abdominal bruit.     Palpations: Abdomen is soft. There is no fluid wave, hepatosplenomegaly or mass.     Tenderness: There is no abdominal tenderness. There is no CVA tenderness, guarding or rebound.     Hernia: No hernia is present.  Musculoskeletal:     Cervical back: Normal range of motion and neck supple.  Lymphadenopathy:     Cervical: No cervical adenopathy.     Upper Body:  No axillary adenopathy present. Skin:    General: Skin is warm, dry and intact.     Findings: No rash.   Neurological:     Mental Status: She is alert.     Cranial Nerves: No cranial nerve deficit.     Sensory: No sensory deficit.     Deep Tendon Reflexes: Strength normal.  Psychiatric:        Mood and Affect: Mood is not anxious or depressed.        Speech: Speech normal.        Behavior: Behavior normal. Behavior is cooperative.        Cognition and Memory:  Cognition and memory normal.        Judgment: Judgment normal.       Results for orders placed or performed in visit on 01/06/21  Comprehensive metabolic panel  Result Value Ref Range   Sodium 140 135 - 145 mEq/L   Potassium 3.6 3.5 - 5.1 mEq/L   Chloride 102 96 - 112 mEq/L   CO2 30 19 - 32 mEq/L   Glucose, Bld 139 (H) 70 - 99 mg/dL   BUN 14 6 - 23 mg/dL   Creatinine, Ser 0.80 0.40 - 1.20 mg/dL   Total Bilirubin 0.5 0.2 - 1.2 mg/dL   Alkaline Phosphatase 60 39 - 117 U/L   AST 14 0 - 37 U/L   ALT 14 0 - 35 U/L   Total Protein 7.4 6.0 - 8.3 g/dL   Albumin 4.2 3.5 - 5.2 g/dL   GFR 79.64 >60.00 mL/min   Calcium 9.4 8.4 - 10.5 mg/dL  Lipid panel  Result Value Ref Range   Cholesterol 163 0 - 200 mg/dL   Triglycerides 66.0 0.0 - 149.0 mg/dL   HDL 76.00 >39.00 mg/dL   VLDL 13.2 0.0 - 40.0 mg/dL   LDL Cholesterol 73 0 - 99 mg/dL   Total CHOL/HDL Ratio 2    NonHDL 86.64   Hemoglobin A1c  Result Value Ref Range   Hgb A1c MFr Bld 7.1 (H) 4.6 - 6.5 %    This visit occurred during the SARS-CoV-2 public health emergency.  Safety protocols were in place, including screening questions prior to the visit, additional usage of staff PPE, and extensive cleaning of exam room while observing appropriate contact time as indicated for disinfecting solutions.   COVID 19 screen:  No recent travel or known exposure to COVID19 The patient denies respiratory symptoms of COVID 19 at this time. The importance of social distancing was discussed today.   Assessment and Plan The patient's preventative maintenance and recommended screening tests  for an annual wellness exam were reviewed in full today. Brought up to date unless services declined.  Counselled on the importance of diet, exercise, and its role in overall health and mortality. The patient's FH and SH was reviewed, including their home life, tobacco status, and drug and alcohol status.   Mammo:01/2020 normal Colon: 02/2016 adenoma, Dr. Havery Moros, repeat in 5 years... this year due. Former smoker minimal use, 3 pack years, remote Vaccines: s/p COVID x 3, flu and Tdap.. can consider shingrix  TAH s/p uterine cancer  Hep C neg HIV: refused Bone density:  due   Problem List Items Addressed This Visit    Class 2 severe obesity due to excess calories with serious comorbidity and body mass index (BMI) of 36.0 to 36.9 in adult [facility]) (Chronic)    Encouraged exercise, weight loss, healthy eating habits.       Drug-induced hyperglycemia    Worsen control in last 3 months.. likely due to prednisone, also poor diet and weight gain.   Encouraged lifestyle change, prednisone avoidance.. will re-eval in 3 months.      Essential hypertension (Chronic)    Stable, chronic.  Continue current medication.   Losartan HCTZ 50/12.5 mg daily      Mixed hyperlipidemia (Chronic)    Improved control on crestor but cannot tolerate daily... decrease to 2-3 days a week. The 10-year ASCVD risk score Mikey Bussing DC Jr., et al., 2013) is: 4.7%   Values used to calculate the score:     Age: 6 years  Sex: Female     Is Non-Hispanic African American: Yes     Diabetic: No     Tobacco smoker: No     Systolic Blood Pressure: 161 mmHg     Is BP treated: Yes     HDL Cholesterol: 76 mg/dL     Total Cholesterol: 163 mg/dL       Peripheral neuropathy due to chemotherapy (HCC) (Chronic)    Tolerable control.      Rheumatoid arthritis (HCC) (Chronic)    Followed by Dr. Amil Amen.Stable control, using prednisone prn.      Thrombocytopenia ([place])    Other Visit Diagnoses    Routine  general medical examination at a health care facility    -  Primary   Estrogen deficiency       Relevant Orders   DG Bone Density   Allergic dermatitis       Relevant Orders   Ambulatory referral to Allergy       Eliezer Lofts, MD

## 2021-01-08 NOTE — Assessment & Plan Note (Signed)
Followed by Dr. Amil Amen.Stable control, using prednisone prn.

## 2021-01-08 NOTE — Assessment & Plan Note (Signed)
Improved control on crestor but cannot tolerate daily... decrease to 2-3 days a week. The 10-year ASCVD risk score Mikey Bussing DC Brooke Bonito., et al., 2013) is: 4.7%   Values used to calculate the score:     Age: 60 years     Sex: Female     Is Non-Hispanic African American: Yes     Diabetic: No     Tobacco smoker: No     Systolic Blood Pressure: 517 mmHg     Is BP treated: Yes     HDL Cholesterol: 76 mg/dL     Total Cholesterol: 163 mg/dL

## 2021-01-08 NOTE — Assessment & Plan Note (Signed)
Stable, chronic.  Continue current medication.   Losartan HCTZ 50/12.5 mg daily

## 2021-01-08 NOTE — Patient Instructions (Addendum)
Decrease chocolate and Dr. Malachi Bonds in diet.  Increase exercise... walk 3 times week.  Work on  weight loss.  Limit prednisone as much as you can.  Try to take the crestor 2-3 times a week.  Call to add bone density to your mammogram on 01/27/2021

## 2021-01-08 NOTE — Assessment & Plan Note (Signed)
Encouraged exercise, weight loss, healthy eating habits. ? ?

## 2021-01-27 LAB — HM MAMMOGRAPHY

## 2021-01-28 ENCOUNTER — Encounter (INDEPENDENT_AMBULATORY_CARE_PROVIDER_SITE_OTHER): Payer: Self-pay | Admitting: Family Medicine

## 2021-01-28 ENCOUNTER — Ambulatory Visit (INDEPENDENT_AMBULATORY_CARE_PROVIDER_SITE_OTHER): Payer: 59 | Admitting: Family Medicine

## 2021-01-28 ENCOUNTER — Other Ambulatory Visit: Payer: Self-pay

## 2021-01-28 VITALS — BP 111/69 | HR 58 | Temp 97.7°F | Ht 64.0 in | Wt 207.0 lb

## 2021-01-28 DIAGNOSIS — I10 Essential (primary) hypertension: Secondary | ICD-10-CM | POA: Diagnosis not present

## 2021-01-28 DIAGNOSIS — R0602 Shortness of breath: Secondary | ICD-10-CM | POA: Diagnosis not present

## 2021-01-28 DIAGNOSIS — I2510 Atherosclerotic heart disease of native coronary artery without angina pectoris: Secondary | ICD-10-CM

## 2021-01-28 DIAGNOSIS — Z9989 Dependence on other enabling machines and devices: Secondary | ICD-10-CM

## 2021-01-28 DIAGNOSIS — E1169 Type 2 diabetes mellitus with other specified complication: Secondary | ICD-10-CM

## 2021-01-28 DIAGNOSIS — R5383 Other fatigue: Secondary | ICD-10-CM | POA: Diagnosis not present

## 2021-01-28 DIAGNOSIS — M069 Rheumatoid arthritis, unspecified: Secondary | ICD-10-CM

## 2021-01-28 DIAGNOSIS — D649 Anemia, unspecified: Secondary | ICD-10-CM

## 2021-01-28 DIAGNOSIS — Z1331 Encounter for screening for depression: Secondary | ICD-10-CM

## 2021-01-28 DIAGNOSIS — E7849 Other hyperlipidemia: Secondary | ICD-10-CM

## 2021-01-28 DIAGNOSIS — Z9189 Other specified personal risk factors, not elsewhere classified: Secondary | ICD-10-CM | POA: Diagnosis not present

## 2021-01-28 DIAGNOSIS — G4733 Obstructive sleep apnea (adult) (pediatric): Secondary | ICD-10-CM

## 2021-01-28 DIAGNOSIS — Z8542 Personal history of malignant neoplasm of other parts of uterus: Secondary | ICD-10-CM

## 2021-01-28 DIAGNOSIS — M5441 Lumbago with sciatica, right side: Secondary | ICD-10-CM

## 2021-01-28 DIAGNOSIS — E559 Vitamin D deficiency, unspecified: Secondary | ICD-10-CM

## 2021-01-28 DIAGNOSIS — Z6835 Body mass index (BMI) 35.0-35.9, adult: Secondary | ICD-10-CM

## 2021-01-28 DIAGNOSIS — Z0289 Encounter for other administrative examinations: Secondary | ICD-10-CM

## 2021-01-28 DIAGNOSIS — G8929 Other chronic pain: Secondary | ICD-10-CM

## 2021-01-28 DIAGNOSIS — K219 Gastro-esophageal reflux disease without esophagitis: Secondary | ICD-10-CM

## 2021-01-29 ENCOUNTER — Encounter: Payer: Self-pay | Admitting: Family Medicine

## 2021-01-29 LAB — CBC WITH DIFFERENTIAL/PLATELET
Basophils Absolute: 0 10*3/uL (ref 0.0–0.2)
Basos: 0 %
EOS (ABSOLUTE): 0.1 10*3/uL (ref 0.0–0.4)
Eos: 3 %
Hemoglobin: 11.5 g/dL (ref 11.1–15.9)
Immature Grans (Abs): 0 10*3/uL (ref 0.0–0.1)
Immature Granulocytes: 0 %
Lymphocytes Absolute: 1.9 10*3/uL (ref 0.7–3.1)
Lymphs: 35 %
MCH: 28 pg (ref 26.6–33.0)
MCHC: 32.8 g/dL (ref 31.5–35.7)
MCV: 86 fL (ref 79–97)
Monocytes Absolute: 0.4 10*3/uL (ref 0.1–0.9)
Monocytes: 8 %
Neutrophils Absolute: 2.8 10*3/uL (ref 1.4–7.0)
Neutrophils: 54 %
RBC: 4.1 x10E6/uL (ref 3.77–5.28)
RDW: 13.7 % (ref 11.7–15.4)
WBC: 5.2 10*3/uL (ref 3.4–10.8)

## 2021-01-29 LAB — TSH: TSH: 0.585 u[IU]/mL (ref 0.450–4.500)

## 2021-01-29 LAB — COMPREHENSIVE METABOLIC PANEL
ALT: 15 IU/L (ref 0–32)
AST: 17 IU/L (ref 0–40)
Albumin/Globulin Ratio: 1.7 (ref 1.2–2.2)
Albumin: 4.8 g/dL (ref 3.8–4.8)
Alkaline Phosphatase: 72 IU/L (ref 44–121)
BUN/Creatinine Ratio: 20 (ref 12–28)
BUN: 17 mg/dL (ref 8–27)
Bilirubin Total: 0.5 mg/dL (ref 0.0–1.2)
CO2: 24 mmol/L (ref 20–29)
Calcium: 9.8 mg/dL (ref 8.7–10.3)
Chloride: 100 mmol/L (ref 96–106)
Creatinine, Ser: 0.86 mg/dL (ref 0.57–1.00)
Globulin, Total: 2.8 g/dL (ref 1.5–4.5)
Glucose: 102 mg/dL — ABNORMAL HIGH (ref 65–99)
Potassium: 3.7 mmol/L (ref 3.5–5.2)
Sodium: 142 mmol/L (ref 134–144)
Total Protein: 7.6 g/dL (ref 6.0–8.5)
eGFR: 77 mL/min/{1.73_m2} (ref >59)

## 2021-01-29 LAB — ANEMIA PANEL
Ferritin: 63 ng/mL (ref 15–150)
Folate, Hemolysate: 404 ng/mL
Folate, RBC: 1151 ng/mL (ref >498)
Hematocrit: 35.1 % (ref 34.0–46.6)
Iron Saturation: 12 % — ABNORMAL LOW (ref 15–55)
Iron: 50 ug/dL (ref 27–139)
Retic Ct Pct: 1.5 % (ref 0.6–2.6)
Total Iron Binding Capacity: 416 ug/dL (ref 250–450)
UIBC: 366 ug/dL (ref 118–369)
Vitamin B-12: 581 pg/mL (ref 232–1245)

## 2021-01-29 LAB — T4, FREE: Free T4: 1.37 ng/dL (ref 0.82–1.77)

## 2021-01-29 LAB — VITAMIN D 25 HYDROXY (VIT D DEFICIENCY, FRACTURES): Vit D, 25-Hydroxy: 34.5 ng/mL (ref 30.0–100.0)

## 2021-02-02 ENCOUNTER — Other Ambulatory Visit: Payer: Self-pay

## 2021-02-02 ENCOUNTER — Inpatient Hospital Stay: Payer: 59 | Attending: Gynecologic Oncology

## 2021-02-02 DIAGNOSIS — C55 Malignant neoplasm of uterus, part unspecified: Secondary | ICD-10-CM | POA: Insufficient documentation

## 2021-02-02 DIAGNOSIS — Z452 Encounter for adjustment and management of vascular access device: Secondary | ICD-10-CM | POA: Diagnosis present

## 2021-02-02 DIAGNOSIS — C549 Malignant neoplasm of corpus uteri, unspecified: Secondary | ICD-10-CM

## 2021-02-02 MED ORDER — HEPARIN SOD (PORK) LOCK FLUSH 100 UNIT/ML IV SOLN
500.0000 [IU] | Freq: Once | INTRAVENOUS | Status: AC
  Administered 2021-02-02: 500 [IU]
  Filled 2021-02-02: qty 5

## 2021-02-02 MED ORDER — SODIUM CHLORIDE 0.9% FLUSH
10.0000 mL | Freq: Once | INTRAVENOUS | Status: AC
  Administered 2021-02-02: 10 mL
  Filled 2021-02-02: qty 10

## 2021-02-02 NOTE — Progress Notes (Signed)
Dear Dr. Alvy Bimler,   Thank you for referring Rachel Rivas to our clinic. The following note includes my evaluation and treatment recommendations.  Chief Complaint:   OBESITY Rachel Rivas (MR# [id number]) is a 60 y.o. female who presents for evaluation and treatment of obesity and related comorbidities. Current BMI is Body mass index is 35.53 kg/m. Rachel Rivas has been struggling with her weight for many years and has been unsuccessful in either losing weight, maintaining weight loss, or reaching her healthy weight goal.  Rachel Rivas is currently in the action stage of change and ready to dedicate time achieving and maintaining a healthier weight. Rachel Rivas is interested in becoming our patient and working on intensive lifestyle modifications including (but not limited to) diet and exercise for weight loss.  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  She works 40-60 hours per week.  She lives with her husband and daughter.  She has newly diagnosed diabetes.  A1c 7.1.  Commutes 1 hour each way to [place] for clinicals.  Rachel Rivas's habits were reviewed today and are as follows: Her family eats meals together, she thinks her family will eat healthier with her, her desired weight loss is 28 lbs, she started gaining weight in 1993, her heaviest weight ever was 213 pounds, she craves sweets, she snacks frequently in the evenings, she skips breakfast frequently and she is frequently drinking liquids with calories.  Depression Screen Rachel Rivas's Food and Mood (modified PHQ-9) score was 3.  Depression screen PHQ 2/9 01/28/2021  Decreased Interest 1  Down, Depressed, Hopeless 0  PHQ - 2 Score 1  Altered sleeping 0  Tired, decreased energy 2  Change in appetite 0  Feeling bad or failure about yourself  0  Trouble concentrating 0  Moving slowly or fidgety/restless 0  Suicidal thoughts 0  PHQ-9 Score 3  Difficult doing work/chores Not difficult at all  Some recent data might be hidden    Assessment/Plan:   1. Other fatigue Rachel Rivas reports daytime somnolence and denies waking up still tired.  Rachel Rivas generally gets 6 or 7 hours of sleep per night, and states that she has generally restful sleep. Snoring is not present. Apneic episodes are not present. Epworth Sleepiness Score is 10.   Rachel Rivas does feel that her weight is causing her energy to be lower than it should be. Fatigue may be related to obesity, depression or many other causes. Labs will be ordered, and in the meanwhile, Rachel Rivas will focus on self care including making healthy food choices, increasing physical activity and focusing on stress reduction.  EKG and labs will be checked today.  - EKG 12-Lead - Anemia panel - CBC with Differential/Platelet - Comprehensive metabolic panel - TSH - T4, free  2. SOB (shortness of breath) on exertion Rachel Rivas notes increasing shortness of breath with exercising and seems to be worsening over time with weight gain. She notes getting out of breath sooner with activity than she used to. This has gotten worse recently. Rachel Rivas denies shortness of breath at rest or orthopnea.  Rachel Rivas's shortness of breath appears to be obesity related and exercise induced. She has agreed to work on weight loss and gradually increase exercise to treat her exercise induced shortness of breath. Will continue to monitor closely.  Will check IC and labs today.  - Anemia panel - CBC with Differential/Platelet - Comprehensive metabolic panel - TSH - T4, free  3. Essential hypertension At goal. Medications: Hyzaar 50-12.5 mg daily.   Plan: Avoid buying foods  that are: processed, frozen, or prepackaged to avoid excess salt. We will watch for signs of hypotension as she continues lifestyle modifications. We will continue to monitor closely alongside her PCP and/or Specialist.   BP Readings from Last 3 Encounters:  01/28/21 111/69  01/08/21 124/70  12/31/20 125/79   Lab Results  Component Value Date    CREATININE 0.86 01/28/2021   4. Type 2 diabetes mellitus with other specified complication, without long-term current use of insulin (HCC) Diabetes Mellitus: Not at goal. Medication: None. Issues reviewed: blood sugar goals, complications of diabetes mellitus, hypoglycemia prevention and treatment, exercise, and nutrition.   Plan: She will  focus on protein-rich, low simple carbohydrate foods. We reviewed the importance of hydration, regular exercise for stress reduction, and restorative sleep.  We discussed Ozempic so that she may read about it prior to the next visit.  We will check labs today.  Lab Results  Component Value Date   HGBA1C 7.1 (H) 01/06/2021   HGBA1C 6.2 (A) 10/23/2020   HGBA1C 6.2 12/30/2019   Lab Results  Component Value Date   LDLCALC 73 01/06/2021   CREATININE 0.86 01/28/2021   5. History of uterine cancer Status post TAH/BSH.  Status post chemo with carboplatin, taxol.  Followed by Dr. Alvy Bimler, with follow up in May.  Implications of weight gain after cancer diagnosis and treatment: . Many cancer survivors gain weight after being diagnosed with cancer, particularly those treated with chemotherapy and women who transition to post-menopausal status as a result of cancer treatment. . Sarcopenic obesity is also particularly common in cancer patients, consisting in loss of muscle mass and concomitant gain of adipose tissue.  . Many factors are associated with a higher likelihood of gaining weight by two years post-diagnosis, including treatment with chemotherapy, younger age at diagnosis, lower physical activity levels and gaining weight within one year following diagnosis. . There is now increased awareness of the availability and benefit of strategies to promote weight loss or prevent weight gain in cancer survivors. Studies with these strategies, and particularly those that include calorie restriction, increased physical activity and behavioural counselling, have  consistently demonstrated that weight loss of 5-7% of body weight may reduce the incidence of other diseases, particularly diabetes and cardiovascular disease, and improve several general and cancer-specific outcomes. Rachel Rivas, [place] CM, [place], et al. American Society of Clinical Oncology position statement on obesity and cancer. J Clin Oncol. 2014;32(31):3568-3574. doi:10.1200/JCO.2014.58.4680.  6. Rheumatoid arthritis She is followed by Dr. Amil Amen in Rheumatology. We will continue to monitor symptoms as they relate to her weight loss journey.  7. Other hyperlipidemia Course: Controlled. Lipid-lowering medications: Crestor 5 mg daily.   Plan: Dietary changes: Increase soluble fiber, decrease simple carbohydrates, decrease saturated fat. Exercise changes: Moderate to vigorous-intensity aerobic activity 150 minutes per week or as tolerated. We will continue to monitor along with PCP/specialists as it pertains to her weight loss journey.  Lab Results  Component Value Date   CHOL 163 01/06/2021   HDL 76.00 01/06/2021   LDLCALC 73 01/06/2021   TRIG 66.0 01/06/2021   CHOLHDL 2 01/06/2021   Lab Results  Component Value Date   ALT 15 01/28/2021   AST 17 01/28/2021   ALKPHOS 72 01/28/2021   BILITOT 0.5 01/28/2021   The 10-year ASCVD risk score Mikey Bussing DC Jr., et al., 2013) is: 8%   Values used to calculate the score:     Age: 13 years     Sex: Female  Is Non-Hispanic African American: Yes     Diabetic: Yes     Tobacco smoker: No     Systolic Blood Pressure: 960 mmHg     Is BP treated: Yes     HDL Cholesterol: 76 mg/dL     Total Cholesterol: 163 mg/dL  8. Coronary artery disease This was seen on CT scan. She will continue to focus on protein-rich, low simple carbohydrate foods. We reviewed the importance of hydration, regular exercise for stress reduction, and restorative sleep.   9. Vitamin D deficiency Optimal goal > 50 ng/dL.  Sausha is taking OTC vitamin D and a  daily multivitamin.  Plan: Continue current OTC vitamin D supplementation.  Will check vitamin D level today.  - VITAMIN D 25 Hydroxy (Vit-D Deficiency, Fractures)  10. Chronic low back pain with right-sided sciatica Uncontrolled. Mahiya uses a cane.  Followed by Dr. Amil Amen. This issue directly impacts care plan for optimization of BMI and metabolic health as it impacts the patient's ability to make lifestyle changes. We will continue to monitor symptoms as they relate to her weight loss journey.  11. OSA on CPAP Tsion is compliant with CPAP.  OSA is a cause of systemic hypertension and is associated with an increased incidence of stroke, heart failure, atrial fibrillation, and coronary heart disease. Severe OSA increases all-cause mortality and  cardiovascular mortality.   Goal: Treatment of OSA via CPAP compliance and weight loss. . Plasma ghrelin levels (appetite or "hunger hormone") are significantly higher in OSA patients than in BMI-matched controls, but decrease to levels similar to those of obese patients without OSA after CPAP treatment.  . Weight loss improves OSA by several mechanisms, including reduction in fatty tissue in the throat (i.e. parapharyngeal fat) and the tongue. Loss of abdominal fat increases mediastinal traction on the upper airway making it less likely to collapse during sleep. . Studies have also shown that compliance with CPAP treatment improves leptin (hunger inhibitory hormone) imbalance.  12. Gastroesophageal reflux disease Angella is taking Pepcid 20 mg twice daily for GERD.  Plan:  Continue Pepcid.  We reviewed the diagnosis of GERD and importance of treatment. We discussed "red flag" symptoms and the importance of follow up if symptoms persisted despite treatment. We reviewed non-pharmacologic management of GERD symptoms: including: caffeine reduction, dietary changes, elevate HOB, NPO after supper, reduction of alcohol intake, tobacco cessation, and weight  loss.  13. Anemia, Nutrition: Iron-rich foods include dark leafy greens, red and white meats, eggs, seafood, and beans.  Certain foods and drinks prevent your body from absorbing iron properly. Avoid eating these foods in the same meal as iron-rich foods or with iron supplements. These foods include: coffee, black tea, and red wine; milk, dairy products, and foods that are high in calcium; beans and soybeans; whole grains. Constipation can be a side effect of iron supplementation. Increased water and fiber intake are helpful. Water goal: > 2 liters/day. Fiber goal: > 25 grams/day.  Plan:  Will check anemia panel today.  - Anemia panel  14. Depression screening Kasy was screened for depression as part of her new patient workup today.  PHQ-9 is 3.  Depression screening is negative.  15. At risk for heart disease Due to Emmabelle's current state of health and medical condition(s), she is at a higher risk for heart disease.  This puts the patient at much greater risk to subsequently develop cardiopulmonary conditions that can significantly affect patient's quality of life in a negative manner.    At  least 11 minutes were spent on counseling Mileena about these concerns today. The initial goal is to lose at least 5-10% of starting weight to help reduce these risk factors.  Counseling:  Intensive lifestyle modifications were discussed with Reita as the most appropriate first line of treatment. We will continue to reassess these conditions on a fairly regular basis in an attempt to decrease the patient's overall morbidity and mortality.  Evidence-based interventions for health behavior change were utilized today including the discussion of self monitoring techniques, problem-solving barriers, and SMART goal setting techniques.  Specifically, regarding patient's less desirable eating habits and patterns, we employed the technique of small changes when Saffron has not been able to fully commit to her prudent  nutritional plan.  16. Class 2 severe obesity with serious comorbidity and body mass index (BMI) of 35.0 to 35.9 in adult, unspecified obesity type (HCC)  Maanya is currently in the action stage of change and her goal is to continue with weight loss efforts. I recommend Anabelen begin the structured treatment plan as follows:  She has agreed to the Category 2 Plan.  Exercise goals: No exercise has been prescribed at this time.   Behavioral modification strategies: increasing lean protein intake, decreasing simple carbohydrates, increasing vegetables, increasing water intake, decreasing liquid calories, decreasing sodium intake and increasing high fiber foods.  She was informed of the importance of frequent follow-up visits to maximize her success with intensive lifestyle modifications for her multiple health conditions. She was informed we would discuss her lab results at her next visit unless there is a critical issue that needs to be addressed sooner. Pattijo agreed to keep her next visit at the agreed upon time to discuss these results.  Objective:   Blood pressure 111/69, pulse (!) 58, temperature 97.7 F (36.5 C), temperature source Oral, height 5\' 4"  (1.626 m), weight 207 lb (93.9 kg), last menstrual period 11/17/2012, SpO2 95 %. Body mass index is 35.53 kg/m.  EKG: Normal sinus rhythm, rate 61 bpm.  Indirect Calorimeter completed today shows a VO2 of 232 and a REE of 1613.  Her calculated basal metabolic rate is 5409 thus her basal metabolic rate is better than expected.  General: Cooperative, alert, well developed, in no acute distress. HEENT: Conjunctivae and lids unremarkable. Cardiovascular: Regular rhythm.  Lungs: Normal work of breathing. Neurologic: No focal deficits.   Lab Results  Component Value Date   CREATININE 0.86 01/28/2021   BUN 17 01/28/2021   NA 142 01/28/2021   K 3.7 01/28/2021   CL 100 01/28/2021   CO2 24 01/28/2021   Lab Results  Component Value Date    ALT 15 01/28/2021   AST 17 01/28/2021   ALKPHOS 72 01/28/2021   BILITOT 0.5 01/28/2021   Lab Results  Component Value Date   HGBA1C 7.1 (H) 01/06/2021   HGBA1C 6.2 (A) 10/23/2020   HGBA1C 6.2 12/30/2019   HGBA1C 6.1 10/28/2019   HGBA1C 6.0 12/21/2018   Lab Results  Component Value Date   TSH 0.585 01/28/2021   Lab Results  Component Value Date   CHOL 163 01/06/2021   HDL 76.00 01/06/2021   LDLCALC 73 01/06/2021   TRIG 66.0 01/06/2021   CHOLHDL 2 01/06/2021   Lab Results  Component Value Date   WBC 5.2 01/28/2021   HGB 11.5 01/28/2021   HCT 35.1 01/28/2021   MCV 86 01/28/2021   PLT CANCELED 01/28/2021   Lab Results  Component Value Date   IRON 50 01/28/2021   TIBC  416 01/28/2021   FERRITIN 63 01/28/2021   Attestation Statements:   This is the patient's first visit at [facility]. The patient's NEW PATIENT PACKET was reviewed at length. Included in the packet: current and past health history, medications, allergies, ROS, gynecologic history (women only), surgical history, family history, social history, weight history, weight loss surgery history (for those that have had weight loss surgery), nutritional evaluation, mood and food questionnaire, PHQ9, Epworth questionnaire, sleep habits questionnaire, patient life and health improvement goals questionnaire. These will all be scanned into the patient's chart under media.   During the visit, I independently reviewed the patient's EKG, bioimpedance scale results, and indirect calorimeter results. I used this information to tailor a meal plan for the patient that will help her to lose weight and will improve her obesity-related conditions going forward. I performed a medically necessary appropriate examination and/or evaluation. I discussed the assessment and treatment plan with the patient. The patient was provided an opportunity to ask questions and all were answered. The patient agreed with the plan and  demonstrated an understanding of the instructions. Labs were ordered at this visit and will be reviewed at the next visit unless more critical results need to be addressed immediately. Clinical information was updated and documented in the EMR.   I, Water quality scientist, CMA, am acting as transcriptionist for Briscoe Deutscher, DO  I have reviewed the above documentation for accuracy and completeness, and I agree with the above. Briscoe Deutscher, DO

## 2021-02-11 ENCOUNTER — Other Ambulatory Visit: Payer: Self-pay

## 2021-02-11 ENCOUNTER — Ambulatory Visit (INDEPENDENT_AMBULATORY_CARE_PROVIDER_SITE_OTHER): Payer: 59 | Admitting: Family Medicine

## 2021-02-11 ENCOUNTER — Encounter (INDEPENDENT_AMBULATORY_CARE_PROVIDER_SITE_OTHER): Payer: Self-pay | Admitting: Family Medicine

## 2021-02-11 VITALS — BP 113/71 | HR 65 | Temp 97.9°F | Ht 64.0 in | Wt 203.0 lb

## 2021-02-11 DIAGNOSIS — M5431 Sciatica, right side: Secondary | ICD-10-CM

## 2021-02-11 DIAGNOSIS — E1159 Type 2 diabetes mellitus with other circulatory complications: Secondary | ICD-10-CM

## 2021-02-11 DIAGNOSIS — Z9189 Other specified personal risk factors, not elsewhere classified: Secondary | ICD-10-CM | POA: Diagnosis not present

## 2021-02-11 DIAGNOSIS — E1169 Type 2 diabetes mellitus with other specified complication: Secondary | ICD-10-CM | POA: Diagnosis not present

## 2021-02-11 DIAGNOSIS — Z6835 Body mass index (BMI) 35.0-35.9, adult: Secondary | ICD-10-CM

## 2021-02-11 DIAGNOSIS — I152 Hypertension secondary to endocrine disorders: Secondary | ICD-10-CM

## 2021-02-11 DIAGNOSIS — E785 Hyperlipidemia, unspecified: Secondary | ICD-10-CM

## 2021-02-17 NOTE — Progress Notes (Signed)
Chief Complaint:   OBESITY Rachel Rivas is here to discuss her progress with her obesity treatment plan along with follow-up of her obesity related diagnoses.   Today's visit was #: 2 Starting weight: 207 lbs Starting date: 01/28/2021 Today's weight: 203 lbs Today's date: 02/11/2021 Total lbs lost to date: 4 lbs Body mass index is 34.84 kg/m.  Total weight loss percentage to date: -1.93%  Interim History:  Lacoya says she is not sure if she ate all 1200 calories.  She cut out breads/sweets.  She has been craving sweets, she says.  Denies polyphagia.    Debie provided the following food recall today:  Breakfast:  2 packs of grits with 4 ounces of cheese (hard to remember) strawberry-vanilla Oikos yogurt, and toast with jelly. Lunch:  Sandwich. She has increased her water intake and uses Crush flavor packs.      Current Meal Plan: the Category 2 Plan for 80% of the time.  Current Exercise Plan: Increased stretching and walking.  Assessment/Plan:   1. Type 2 diabetes mellitus with other specified complication, without long-term current use of insulin ([place]) Diabetes Mellitus: Not at goal.  A1c 7.1 on 01/06/2021.  Medication: None. Issues reviewed: blood sugar goals, complications of diabetes mellitus, hypoglycemia prevention and treatment, exercise, and nutrition.   Plan: The importance of regular follow up with PCP and all other specialists as scheduled was stressed to patient today.  Lab Results  Component Value Date   HGBA1C 7.1 (H) 01/06/2021   HGBA1C 6.2 (A) 10/23/2020   HGBA1C 6.2 12/30/2019   Lab Results  Component Value Date   LDLCALC 73 01/06/2021   CREATININE 0.86 01/28/2021   2. Hypertension associated with type 2 diabetes mellitus ([place]) Not at goal. Medications: Hyzaar 50-12.5 mg daily.   Plan: Avoid buying foods that are: processed, frozen, or prepackaged to avoid excess salt. We will watch for signs of hypotension as she continues lifestyle modifications. We  will continue to monitor closely alongside her PCP and/or Specialist.  Regular follow up with PCP and specialists was also encouraged.   BP Readings from Last 3 Encounters:  02/11/21 113/71  01/28/21 111/69  01/08/21 124/70   Lab Results  Component Value Date   CREATININE 0.86 01/28/2021   3. Hyperlipidemia associated with type 2 diabetes mellitus ([place]) Course: Controlled. Lipid-lowering medications: Crestor 5 mg daily.   Plan: Dietary changes: Increase soluble fiber, decrease simple carbohydrates, decrease saturated fat. Exercise changes: Moderate to vigorous-intensity aerobic activity 150 minutes per week or as tolerated. We will continue to monitor along with PCP/specialists as it pertains to her weight loss journey.  Lab Results  Component Value Date   CHOL 163 01/06/2021   HDL 76.00 01/06/2021   LDLCALC 73 01/06/2021   TRIG 66.0 01/06/2021   CHOLHDL 2 01/06/2021   Lab Results  Component Value Date   ALT 15 01/28/2021   AST 17 01/28/2021   ALKPHOS 72 01/28/2021   BILITOT 0.5 01/28/2021   The 10-year ASCVD risk score Mikey Bussing DC Jr., et al., 2013) is: 8.4%   Values used to calculate the score:     Age: 60 years     Sex: Female     Is Non-Hispanic African American: Yes     Diabetic: Yes     Tobacco smoker: No     Systolic Blood Pressure: 765 mmHg     Is BP treated: Yes     HDL Cholesterol: 76 mg/dL     Total Cholesterol: 163 mg/dL  4. Sciatica of right side Reviewed ED visit from 02/07/2021.  5. At risk for heart disease Due to Jazira's current state of health and medical condition(s), she is at a higher risk for heart disease.  This puts the patient at much greater risk to subsequently develop cardiopulmonary conditions that can significantly affect patient's quality of life in a negative manner.    At least 8 minutes were spent on counseling Chelci about these concerns today. Evidence-based interventions for health behavior change were utilized today including the  discussion of self monitoring techniques, problem-solving barriers, and SMART goal setting techniques.  Specifically, regarding patient's less desirable eating habits and patterns, we employed the technique of small changes when Schylar has not been able to fully commit to her prudent nutritional plan.  6. Obesity, with current BMI 34.9  Course: Renika is currently in the action stage of change. As such, her goal is to continue with weight loss efforts.   Nutrition goals: She has agreed to the Category 2 Plan.   Exercise goals: As is.  Behavioral modification strategies: increasing lean protein intake, decreasing simple carbohydrates, increasing vegetables and increasing water intake.  Anhelica has agreed to follow-up with our clinic in 2-3 weeks. She was informed of the importance of frequent follow-up visits to maximize her success with intensive lifestyle modifications for her multiple health conditions.   Objective:   Blood pressure 113/71, pulse 65, temperature 97.9 F (36.6 C), temperature source Oral, height 5\' 4"  (1.626 m), weight 203 lb (92.1 kg), last menstrual period 11/17/2012, SpO2 96 %. Body mass index is 34.84 kg/m.  General: Cooperative, alert, well developed, in no acute distress. HEENT: Conjunctivae and lids unremarkable. Cardiovascular: Regular rhythm.  Lungs: Normal work of breathing. Neurologic: No focal deficits.   Lab Results  Component Value Date   CREATININE 0.86 01/28/2021   BUN 17 01/28/2021   NA 142 01/28/2021   K 3.7 01/28/2021   CL 100 01/28/2021   CO2 24 01/28/2021   Lab Results  Component Value Date   ALT 15 01/28/2021   AST 17 01/28/2021   ALKPHOS 72 01/28/2021   BILITOT 0.5 01/28/2021   Lab Results  Component Value Date   HGBA1C 7.1 (H) 01/06/2021   HGBA1C 6.2 (A) 10/23/2020   HGBA1C 6.2 12/30/2019   HGBA1C 6.1 10/28/2019   HGBA1C 6.0 12/21/2018   Lab Results  Component Value Date   TSH 0.585 01/28/2021   Lab Results  Component  Value Date   CHOL 163 01/06/2021   HDL 76.00 01/06/2021   LDLCALC 73 01/06/2021   TRIG 66.0 01/06/2021   CHOLHDL 2 01/06/2021   Lab Results  Component Value Date   WBC 5.2 01/28/2021   HGB 11.5 01/28/2021   HCT 35.1 01/28/2021   MCV 86 01/28/2021   PLT CANCELED 01/28/2021   Lab Results  Component Value Date   IRON 50 01/28/2021   TIBC 416 01/28/2021   FERRITIN 63 01/28/2021   Attestation Statements:   Reviewed by clinician on day of visit: allergies, medications, problem list, medical history, surgical history, family history, social history, and previous encounter notes.  I, Water quality scientist, CMA, am acting as transcriptionist for Briscoe Deutscher, DO  I have reviewed the above documentation for accuracy and completeness, and I agree with the above. Briscoe Deutscher, DO

## 2021-03-01 ENCOUNTER — Ambulatory Visit (INDEPENDENT_AMBULATORY_CARE_PROVIDER_SITE_OTHER): Payer: 59 | Admitting: Adult Health

## 2021-03-01 ENCOUNTER — Encounter (INDEPENDENT_AMBULATORY_CARE_PROVIDER_SITE_OTHER): Payer: Self-pay | Admitting: Adult Health

## 2021-03-01 ENCOUNTER — Other Ambulatory Visit: Payer: Self-pay

## 2021-03-01 VITALS — BP 114/72 | HR 63 | Temp 98.1°F | Ht 64.0 in | Wt 201.0 lb

## 2021-03-01 DIAGNOSIS — Z6836 Body mass index (BMI) 36.0-36.9, adult: Secondary | ICD-10-CM

## 2021-03-01 DIAGNOSIS — I152 Hypertension secondary to endocrine disorders: Secondary | ICD-10-CM

## 2021-03-01 DIAGNOSIS — R739 Hyperglycemia, unspecified: Secondary | ICD-10-CM

## 2021-03-01 DIAGNOSIS — E1159 Type 2 diabetes mellitus with other circulatory complications: Secondary | ICD-10-CM

## 2021-03-02 NOTE — Progress Notes (Signed)
Chief Complaint:   OBESITY Rachel Rivas is here to discuss her progress with her obesity treatment plan along with follow-up of her obesity related diagnoses. Rachel Rivas is on the Category 2 Plan and states she is following her eating plan approximately 75% of the time. Rachel Rivas states she is not currently exercising.  Today's visit was #: 3 Starting weight: 207 lbs Starting date: 01/28/2021 Today's weight: 201 lbs Today's date: 03/01/2021 Total lbs lost to date: 6 Total lbs lost since last in-office visit: 2  Interim History: Rachel Rivas continues to enjoy the foods and structure of category 2 plan. She is down another 2 lbs for a total weight loss of 6 lbs! She feels that her clothes are fitting better.  Subjective:   1. Drug-induced hyperglycemia Rachel Rivas's 01/06/2021 A1c 7.1. She was taking prednisone at the time of lab draw. She has been pre-diabetic prior to her 1 time A1c of >6.5.  2. Hypertension associated with type 2 diabetes mellitus ([place]) Rachel Rivas's BP/HR are excellent at OV. She is on Hyzaar 50-12.5 mg QD. Pt denies cardiac symptoms.   BP Readings from Last 3 Encounters:  03/01/21 114/72  02/11/21 113/71  01/28/21 111/69    Assessment/Plan:   1. Drug-induced hyperglycemia Fasting labs will be obtained and results with be discussed with Rachel Rivas in 2 weeks at her follow up visit. In the meanwhile Rachel Rivas was started on a lower simple carbohydrate diet and will work on weight loss efforts. Continue Category 2 meal plan. PCP to recheck A1c in May 2022.  2. Hypertension associated with type 2 diabetes mellitus ([place]) Rachel Rivas is working on healthy weight loss and exercise to improve blood pressure control. We will watch for signs of hypotension as she continues her lifestyle modifications. Continue category 2 meal plan and Hyzaar.  3. Obesity with current BMI 34.6 Rachel Rivas is currently in the action stage of change. As such, her goal is to continue with weight loss efforts. She has agreed to  the Category 2 Plan.   Exercise goals: As is  Behavioral modification strategies: increasing lean protein intake, meal planning and cooking strategies and planning for success.  Rachel Rivas has agreed to follow-up with our clinic in 2 weeks. She was informed of the importance of frequent follow-up visits to maximize her success with intensive lifestyle modifications for her multiple health conditions.   Objective:   Blood pressure 114/72, pulse 63, temperature 98.1 F (36.7 C), height 5\' 4"  (1.626 m), weight 201 lb (91.2 kg), last menstrual period 11/17/2012, SpO2 100 %. Body mass index is 34.5 kg/m.  General: Cooperative, alert, well developed, in no acute distress. HEENT: Conjunctivae and lids unremarkable. Cardiovascular: Regular rhythm.  Lungs: Normal work of breathing. Neurologic: No focal deficits.   Lab Results  Component Value Date   CREATININE 0.86 01/28/2021   BUN 17 01/28/2021   NA 142 01/28/2021   K 3.7 01/28/2021   CL 100 01/28/2021   CO2 24 01/28/2021   Lab Results  Component Value Date   ALT 15 01/28/2021   AST 17 01/28/2021   ALKPHOS 72 01/28/2021   BILITOT 0.5 01/28/2021   Lab Results  Component Value Date   HGBA1C 7.1 (H) 01/06/2021   HGBA1C 6.2 (A) 10/23/2020   HGBA1C 6.2 12/30/2019   HGBA1C 6.1 10/28/2019   HGBA1C 6.0 12/21/2018   No results found for: INSULIN Lab Results  Component Value Date   TSH 0.585 01/28/2021   Lab Results  Component Value Date   CHOL 163 01/06/2021  HDL 76.00 01/06/2021   LDLCALC 73 01/06/2021   TRIG 66.0 01/06/2021   CHOLHDL 2 01/06/2021   Lab Results  Component Value Date   WBC 5.2 01/28/2021   HGB 11.5 01/28/2021   HCT 35.1 01/28/2021   MCV 86 01/28/2021   PLT CANCELED 01/28/2021   Lab Results  Component Value Date   IRON 50 01/28/2021   TIBC 416 01/28/2021   FERRITIN 63 01/28/2021    Attestation Statements:   Reviewed by clinician on day of visit: allergies, medications, problem list, medical  history, surgical history, family history, social history, and previous encounter notes.  Time spent on visit including pre-visit chart review and post-visit care and charting was 32 minutes.   Coral Ceo, am acting as Location manager for Mina Marble, NP.  I have reviewed the above documentation for accuracy and completeness, and I agree with the above. -  Elysse Polidore d. Para Cossey, NP-C

## 2021-03-03 ENCOUNTER — Ambulatory Visit: Payer: 59 | Admitting: Allergy

## 2021-03-17 MED ORDER — IBUPROFEN 800 MG PO TABS: 800.0000 mg | ORAL_TABLET | Freq: Three times a day (TID) | ORAL | 2 refills | Status: DC

## 2021-03-17 NOTE — Telephone Encounter (Signed)
Last office visit 01/08/2021 for CPE.  Last refilled ?04/07/2020 for #30 with no refills.  Next Appt: 04/16/2021 for follow up elevated glucose.

## 2021-03-18 ENCOUNTER — Encounter: Payer: Self-pay | Admitting: Gastroenterology

## 2021-03-24 ENCOUNTER — Ambulatory Visit (INDEPENDENT_AMBULATORY_CARE_PROVIDER_SITE_OTHER): Payer: 59 | Admitting: Family Medicine

## 2021-03-29 ENCOUNTER — Ambulatory Visit (INDEPENDENT_AMBULATORY_CARE_PROVIDER_SITE_OTHER): Payer: 59 | Admitting: Family Medicine

## 2021-03-29 ENCOUNTER — Encounter (INDEPENDENT_AMBULATORY_CARE_PROVIDER_SITE_OTHER): Payer: Self-pay | Admitting: Family Medicine

## 2021-03-29 ENCOUNTER — Other Ambulatory Visit: Payer: Self-pay

## 2021-03-29 VITALS — BP 117/72 | HR 62 | Temp 97.6°F | Ht 64.0 in | Wt 201.0 lb

## 2021-03-29 DIAGNOSIS — E1159 Type 2 diabetes mellitus with other circulatory complications: Secondary | ICD-10-CM

## 2021-03-29 DIAGNOSIS — Z6835 Body mass index (BMI) 35.0-35.9, adult: Secondary | ICD-10-CM

## 2021-03-29 DIAGNOSIS — E785 Hyperlipidemia, unspecified: Secondary | ICD-10-CM

## 2021-03-29 DIAGNOSIS — I152 Hypertension secondary to endocrine disorders: Secondary | ICD-10-CM

## 2021-03-29 DIAGNOSIS — E1169 Type 2 diabetes mellitus with other specified complication: Secondary | ICD-10-CM

## 2021-03-29 DIAGNOSIS — E119 Type 2 diabetes mellitus without complications: Secondary | ICD-10-CM | POA: Insufficient documentation

## 2021-04-05 ENCOUNTER — Other Ambulatory Visit: Payer: Self-pay | Admitting: Hematology and Oncology

## 2021-04-05 DIAGNOSIS — C55 Malignant neoplasm of uterus, part unspecified: Secondary | ICD-10-CM

## 2021-04-05 DIAGNOSIS — C549 Malignant neoplasm of corpus uteri, unspecified: Secondary | ICD-10-CM

## 2021-04-06 NOTE — Progress Notes (Signed)
Chief Complaint:   OBESITY Rachel Rivas is here to discuss her progress with her obesity treatment plan along with follow-up of her obesity related diagnoses.   Today's visit was #: 4 Starting weight: 207 lbs Starting date: 01/28/2021 Today's weight: 201 lbs Today's date: 03/29/2021 Weight change since last visit: 0 Total lbs lost to date: 6 lbs Body mass index is 34.5 kg/m.  Total weight loss percentage to date: -2.90%  Interim History:  Rachel Rivas is a patient of Dr. Juleen China.  This is her first office visit with me.  For breakfast, she has grits (cheesy) or yogurt and toast.  For lunch, a sandwich with 4+ ounces of protein.  For dinner, she eats off plan 50% of the time and eats out just 3-5 days per week.  She occasionally skips meals because she gets too busy.  Plan:  Try to increase water and avoid 2 simple carbohydrates in a day ( for snacks).  Current Meal Plan: the Category 2 Plan for 75-80% of the time.   Current Exercise Plan: Walking for 15-20 minutes 2 times per week.  Assessment/Plan:     1. Type 2 diabetes mellitus with other specified complication, without long-term current use of insulin (HCC) Diabetes Mellitus: Not at goal. Medication: None. Issues reviewed: blood sugar goals, complications of diabetes mellitus, hypoglycemia prevention and treatment, exercise, and nutrition.  She does not check her blood sugars.  Thought to be secondary to prednisone use and PCP will recheck her A1c at the end of May.  Plan:   We will consider adding GLP-1 or metformin in the future.  A1c almost at goal goal at 7.1.  Continue prudent nutritional plan and weight loss to improve glucose control.  Continue to follow-up for chronic care per recommendations of PCP. The patient will continue to focus on protein-rich, low simple carbohydrate foods. We reviewed the importance of hydration, regular exercise for stress reduction, and restorative sleep.   Lab Results  Component Value Date   HGBA1C  7.1 (H) 01/06/2021   HGBA1C 6.2 (A) 10/23/2020   HGBA1C 6.2 12/30/2019   Lab Results  Component Value Date   LDLCALC 73 01/06/2021   CREATININE 0.86 01/28/2021   2. Hypertension associated with type 2 diabetes mellitus ([place]) - Denies symptoms or concerns.  Medications: Hyzaar 50-12.5 mg daily.   Plan:  Blood pressure at goal.  Serum creatinine at goal as well.   Avoid buying foods that are: processed, frozen, or prepackaged to avoid excess salt.  - We will watch for signs of hypotension as she continues lifestyle modifications.  Advised patient to look for signs and symptoms as well and follow-up sooner than planned if she develops any.  BP Readings from Last 3 Encounters:  03/29/21 117/72  03/01/21 114/72  02/11/21 113/71   Lab Results  Component Value Date   CREATININE 0.86 01/28/2021   3. Hyperlipidemia associated with type 2 diabetes mellitus ([place]) Course: LDL at goal; HDL exceptional at 76.   lipid-lowering medications: Crestor 5 mg daily.  Denies side effects or concerns with medications.  Plan: Dietary changes: Increase soluble fiber, decrease simple carbohydrates, decrease saturated fat. Exercise changes: Moderate to vigorous-intensity aerobic activity 150 minutes per week or as tolerated. We will continue to monitor along with PCP/specialists as it pertains to her weight loss journey.  Lab Results  Component Value Date   CHOL 163 01/06/2021   HDL 76.00 01/06/2021   LDLCALC 73 01/06/2021   TRIG 66.0 01/06/2021   CHOLHDL  2 01/06/2021   Lab Results  Component Value Date   ALT 15 01/28/2021   AST 17 01/28/2021   ALKPHOS 72 01/28/2021   BILITOT 0.5 01/28/2021   The 10-year ASCVD risk score Mikey Bussing DC Jr., et al., 2013) is: 9.3%   Values used to calculate the score:     Age: 60 years     Sex: Female     Is Non-Hispanic African American: Yes     Diabetic: Yes     Tobacco smoker: No     Systolic Blood Pressure: 194 mmHg     Is BP treated: Yes     HDL Cholesterol:  76 mg/dL     Total Cholesterol: 163 mg/dL  4. Obesity, current BMI 34.5  Course: Rachel Rivas is currently in the action stage of change. As such, her goal is to continue with weight loss efforts.   Nutrition goals: She has agreed to the Category 2 Plan with breakfast options.   Exercise goals: As is.  Behavioral modification strategies: increasing lean protein intake, decreasing simple carbohydrates, no skipping meals, meal planning and cooking strategies and planning for success.  Loan has agreed to follow-up with our clinic in 2 weeks. She was informed of the importance of frequent follow-up visits to maximize her success with intensive lifestyle modifications for her multiple health conditions.   Objective:   Blood pressure 117/72, pulse 62, temperature 97.6 F (36.4 C), height 5\' 4"  (1.626 m), weight 201 lb (91.2 kg), last menstrual period 11/17/2012, SpO2 96 %. Body mass index is 34.5 kg/m.  General: Cooperative, alert, well developed, in no acute distress. HEENT: Conjunctivae and lids unremarkable. Cardiovascular: Regular rhythm.  Lungs: Normal work of breathing. Neurologic: No focal deficits.   Lab Results  Component Value Date   CREATININE 0.86 01/28/2021   BUN 17 01/28/2021   NA 142 01/28/2021   K 3.7 01/28/2021   CL 100 01/28/2021   CO2 24 01/28/2021   Lab Results  Component Value Date   ALT 15 01/28/2021   AST 17 01/28/2021   ALKPHOS 72 01/28/2021   BILITOT 0.5 01/28/2021   Lab Results  Component Value Date   HGBA1C 7.1 (H) 01/06/2021   HGBA1C 6.2 (A) 10/23/2020   HGBA1C 6.2 12/30/2019   HGBA1C 6.1 10/28/2019   HGBA1C 6.0 12/21/2018   Lab Results  Component Value Date   TSH 0.585 01/28/2021   Lab Results  Component Value Date   CHOL 163 01/06/2021   HDL 76.00 01/06/2021   LDLCALC 73 01/06/2021   TRIG 66.0 01/06/2021   CHOLHDL 2 01/06/2021   Lab Results  Component Value Date   WBC 5.2 01/28/2021   HGB 11.5 01/28/2021   HCT 35.1 01/28/2021    MCV 86 01/28/2021   PLT CANCELED 01/28/2021   Lab Results  Component Value Date   IRON 50 01/28/2021   TIBC 416 01/28/2021   FERRITIN 63 01/28/2021   Attestation Statements:   Reviewed by clinician on day of visit: allergies, medications, problem list, medical history, surgical history, family history, social history, and previous encounter notes.  Time spent on visit including pre-visit chart review and post-visit care and charting was 30 minutes.   I, Water quality scientist, CMA, am acting as Location manager for [employer], DO.  I have reviewed the above documentation for accuracy and completeness, and I agree with the above. Marjory Sneddon, D.O.  The [place] was signed into law in 2016 which includes the topic of electronic  health records.  This provides immediate access to information in MyChart.  This includes consultation notes, operative notes, office notes, lab results and pathology reports.  If you have any questions about what you read please let us know at your next visit so we can discuss your concerns and take corrective action if need be.  We are right here with you.

## 2021-04-07 ENCOUNTER — Ambulatory Visit (INDEPENDENT_AMBULATORY_CARE_PROVIDER_SITE_OTHER): Payer: 59 | Admitting: Adult Health

## 2021-04-12 ENCOUNTER — Other Ambulatory Visit: Payer: PRIVATE HEALTH INSURANCE

## 2021-04-12 ENCOUNTER — Ambulatory Visit (HOSPITAL_COMMUNITY)
Admission: RE | Admit: 2021-04-12 | Discharge: 2021-04-12 | Disposition: A | Discharge status: Home / Self Care | Payer: 59 | Source: Ambulatory Visit | Attending: Hematology and Oncology | Admitting: Hematology and Oncology

## 2021-04-12 ENCOUNTER — Other Ambulatory Visit: Payer: Self-pay

## 2021-04-12 ENCOUNTER — Inpatient Hospital Stay: Payer: 59 | Attending: Gynecologic Oncology

## 2021-04-12 DIAGNOSIS — M069 Rheumatoid arthritis, unspecified: Secondary | ICD-10-CM | POA: Insufficient documentation

## 2021-04-12 DIAGNOSIS — Z79899 Other long term (current) drug therapy: Secondary | ICD-10-CM | POA: Diagnosis not present

## 2021-04-12 DIAGNOSIS — D6959 Other secondary thrombocytopenia: Secondary | ICD-10-CM | POA: Diagnosis not present

## 2021-04-12 DIAGNOSIS — C549 Malignant neoplasm of corpus uteri, unspecified: Secondary | ICD-10-CM

## 2021-04-12 DIAGNOSIS — C55 Malignant neoplasm of uterus, part unspecified: Secondary | ICD-10-CM | POA: Insufficient documentation

## 2021-04-12 DIAGNOSIS — Z7189 Other specified counseling: Secondary | ICD-10-CM

## 2021-04-12 DIAGNOSIS — G620 Drug-induced polyneuropathy: Secondary | ICD-10-CM | POA: Diagnosis not present

## 2021-04-12 DIAGNOSIS — T451X5D Adverse effect of antineoplastic and immunosuppressive drugs, subsequent encounter: Secondary | ICD-10-CM | POA: Insufficient documentation

## 2021-04-12 DIAGNOSIS — K769 Liver disease, unspecified: Secondary | ICD-10-CM

## 2021-04-12 DIAGNOSIS — D693 Immune thrombocytopenic purpura: Secondary | ICD-10-CM | POA: Insufficient documentation

## 2021-04-12 DIAGNOSIS — Z452 Encounter for adjustment and management of vascular access device: Secondary | ICD-10-CM | POA: Insufficient documentation

## 2021-04-12 LAB — CMP (CANCER CENTER ONLY)
ALT: 16 U/L (ref 0–44)
AST: 15 U/L (ref 15–41)
Albumin: 4.2 g/dL (ref 3.5–5.0)
Alkaline Phosphatase: 72 U/L (ref 38–126)
Anion gap: 8 (ref 5–15)
BUN: 10 mg/dL (ref 8–23)
CO2: 30 mmol/L (ref 22–32)
Calcium: 10.3 mg/dL (ref 8.9–10.3)
Chloride: 102 mmol/L (ref 98–111)
Creatinine: 0.78 mg/dL (ref 0.44–1.00)
GFR, Estimated: >60 mL/min (ref >60)
Glucose, Bld: 97 mg/dL (ref 70–99)
Potassium: 3.7 mmol/L (ref 3.5–5.1)
Sodium: 140 mmol/L (ref 135–145)
Total Bilirubin: 0.5 mg/dL (ref 0.3–1.2)
Total Protein: 8 g/dL (ref 6.5–8.1)

## 2021-04-12 LAB — CBC WITH DIFFERENTIAL (CANCER CENTER ONLY)
Abs Immature Granulocytes: 0.03 10*3/uL (ref 0.00–0.07)
Basophils Absolute: 0 10*3/uL (ref 0.0–0.1)
Basophils Relative: 0 %
Eosinophils Absolute: 0.1 10*3/uL (ref 0.0–0.5)
Eosinophils Relative: 2 %
HCT: 32.6 % — ABNORMAL LOW (ref 36.0–46.0)
Hemoglobin: 10.5 g/dL — ABNORMAL LOW (ref 12.0–15.0)
Immature Granulocytes: 1 %
Lymphocytes Relative: 41 %
Lymphs Abs: 2.7 10*3/uL (ref 0.7–4.0)
MCH: 27.4 pg (ref 26.0–34.0)
MCHC: 32.2 g/dL (ref 30.0–36.0)
MCV: 85.1 fL (ref 80.0–100.0)
Monocytes Absolute: 0.5 10*3/uL (ref 0.1–1.0)
Monocytes Relative: 8 %
Neutro Abs: 3.2 10*3/uL (ref 1.7–7.7)
Neutrophils Relative %: 48 %
Platelet Count: 75 10*3/uL — ABNORMAL LOW (ref 150–400)
RBC: 3.83 MIL/uL — ABNORMAL LOW (ref 3.87–5.11)
RDW: 15.7 % — ABNORMAL HIGH (ref 11.5–15.5)
WBC Count: 6.6 10*3/uL (ref 4.0–10.5)
nRBC: 0 % (ref 0.0–0.2)

## 2021-04-12 MED ORDER — HEPARIN SOD (PORK) LOCK FLUSH 100 UNIT/ML IV SOLN
INTRAVENOUS | Status: AC
  Filled 2021-04-12: qty 5

## 2021-04-12 MED ORDER — SODIUM CHLORIDE 0.9% FLUSH
10.0000 mL | Freq: Once | INTRAVENOUS | Status: AC
  Administered 2021-04-12: 10 mL
  Filled 2021-04-12: qty 10

## 2021-04-12 MED ORDER — HEPARIN SOD (PORK) LOCK FLUSH 100 UNIT/ML IV SOLN
500.0000 [IU] | Freq: Once | INTRAVENOUS | Status: AC
  Administered 2021-04-12: 500 [IU] via INTRAVENOUS

## 2021-04-12 NOTE — Patient Instructions (Signed)
Implanted Port Insertion, Care After This sheet gives you information about how to care for yourself after your procedure. Your health care provider may also give you more specific instructions. If you have problems or questions, contact your health care provider. What can I expect after the procedure? After the procedure, it is common to have:  Discomfort at the port insertion site.  Bruising on the skin over the port. This should improve over 3-4 days. Follow these instructions at home: Port care  After your port is placed, you will get a manufacturer's information card. The card has information about your port. Keep this card with you at all times.  Take care of the port as told by your health care provider. Ask your health care provider if you or a family member can get training for taking care of the port at home. A home health care nurse may also take care of the port.  Make sure to remember what type of port you have. Incision care  Follow instructions from your health care provider about how to take care of your port insertion site. Make sure you: ? Wash your hands with soap and water before and after you change your bandage (dressing). If soap and water are not available, use hand sanitizer. ? Change your dressing as told by your health care provider. ? Leave stitches (sutures), skin glue, or adhesive strips in place. These skin closures may need to stay in place for 2 weeks or longer. If adhesive strip edges start to loosen and curl up, you may trim the loose edges. Do not remove adhesive strips completely unless your health care provider tells you to do that.  Check your port insertion site every day for signs of infection. Check for: ? Redness, swelling, or pain. ? Fluid or blood. ? Warmth. ? Pus or a bad smell.      Activity  Return to your normal activities as told by your health care provider. Ask your health care provider what activities are safe for you.  Do not  lift anything that is heavier than 10 lb (4.5 kg), or the limit that you are told, until your health care provider says that it is safe. General instructions  Take over-the-counter and prescription medicines only as told by your health care provider.  Do not take baths, swim, or use a hot tub until your health care provider approves. Ask your health care provider if you may take showers. You may only be allowed to take sponge baths.  Do not drive for 24 hours if you were given a sedative during your procedure.  Wear a medical alert bracelet in case of an emergency. This will tell any health care providers that you have a port.  Keep all follow-up visits as told by your health care provider. This is important. Contact a health care provider if:  You cannot flush your port with saline as directed, or you cannot draw blood from the port.  You have a fever or chills.  You have redness, swelling, or pain around your port insertion site.  You have fluid or blood coming from your port insertion site.  Your port insertion site feels warm to the touch.  You have pus or a bad smell coming from the port insertion site. Get help right away if:  You have chest pain or shortness of breath.  You have bleeding from your port that you cannot control. Summary  Take care of the port as told by your   health care provider. Keep the manufacturer's information card with you at all times.  Change your dressing as told by your health care provider.  Contact a health care provider if you have a fever or chills or if you have redness, swelling, or pain around your port insertion site.  Keep all follow-up visits as told by your health care provider. This information is not intended to replace advice given to you by your health care provider. Make sure you discuss any questions you have with your health care provider. Document Revised: 06/05/2018 Document Reviewed: 06/05/2018 Elsevier Patient Education   2021 [employer]  

## 2021-04-13 ENCOUNTER — Ambulatory Visit: Payer: PRIVATE HEALTH INSURANCE | Admitting: Hematology and Oncology

## 2021-04-13 ENCOUNTER — Inpatient Hospital Stay (HOSPITAL_BASED_OUTPATIENT_CLINIC_OR_DEPARTMENT_OTHER): Payer: 59 | Admitting: Hematology and Oncology

## 2021-04-13 VITALS — BP 140/61 | HR 73 | Temp 97.5°F | Resp 20 | Ht 64.0 in | Wt 205.6 lb

## 2021-04-13 DIAGNOSIS — G620 Drug-induced polyneuropathy: Secondary | ICD-10-CM | POA: Diagnosis not present

## 2021-04-13 DIAGNOSIS — D61818 Other pancytopenia: Secondary | ICD-10-CM

## 2021-04-13 DIAGNOSIS — T451X5D Adverse effect of antineoplastic and immunosuppressive drugs, subsequent encounter: Secondary | ICD-10-CM

## 2021-04-13 DIAGNOSIS — M069 Rheumatoid arthritis, unspecified: Secondary | ICD-10-CM | POA: Diagnosis not present

## 2021-04-13 DIAGNOSIS — C55 Malignant neoplasm of uterus, part unspecified: Secondary | ICD-10-CM | POA: Diagnosis not present

## 2021-04-13 DIAGNOSIS — C549 Malignant neoplasm of corpus uteri, unspecified: Secondary | ICD-10-CM

## 2021-04-13 DIAGNOSIS — T451X5A Adverse effect of antineoplastic and immunosuppressive drugs, initial encounter: Secondary | ICD-10-CM

## 2021-04-13 DIAGNOSIS — Z452 Encounter for adjustment and management of vascular access device: Secondary | ICD-10-CM | POA: Diagnosis not present

## 2021-04-14 ENCOUNTER — Encounter: Payer: Self-pay | Admitting: Hematology and Oncology

## 2021-04-14 NOTE — Assessment & Plan Note (Signed)
She has chronic thrombocytopenia from ITP Her anemia is likely caused by methotrexate She will continue folic acid supplementation

## 2021-04-14 NOTE — Assessment & Plan Note (Signed)
She has minimal residual neuropathy Observed only

## 2021-04-14 NOTE — Progress Notes (Signed)
[facility] OFFICE PROGRESS NOTE  Patient Care Team: [name], MD as PCP - General (Family Medicine) Buford Dresser, MD as PCP - Cardiology (Cardiology) Awanda Mink Craige Cotta, RN as Oncology Nurse Navigator (Oncology)  ASSESSMENT & PLAN:  Adenosarcoma of body of uterus [facility]) I have reviewed her blood work and imaging studies She has no signs of residual disease The abnormalities noted in the pelvic floor is likely chronic changes related to postoperative seroma I recommend follow-up with gynecologist in 3 months and I plan to see her in 6 months We discussed the risk and benefits of port removal   Rheumatoid arthritis ([place]) She was restarted back on methotrexate for rheumatoid arthritis I would defer to her rheumatologist for management  Pancytopenia, acquired [facility]) She has chronic thrombocytopenia from ITP Her anemia is likely caused by methotrexate She will continue folic acid supplementation  Peripheral neuropathy due to chemotherapy [facility]) She has minimal residual neuropathy Observed only   Orders Placed This Encounter  Procedures  . IR REMOVAL TUN ACCESS W/ PORT W/O FL MOD SED    Standing Status:   Future    Standing Expiration Date:   04/13/2022    Order Specific Question:   Reason for exam:    Answer:   chemo is completed    Order Specific Question:   Preferred Imaging Location?    Answer:   [facility]  . CBC with Differential/Platelet    Standing Status:   Standing    Number of Occurrences:   22    Standing Expiration Date:   04/13/2022  . Comprehensive metabolic panel    Standing Status:   Standing    Number of Occurrences:   33    Standing Expiration Date:   04/13/2022    All questions were answered. The patient knows to call the clinic with any problems, questions or concerns. The total time spent in the appointment was 20 minutes encounter with patients including review of chart and various tests results, discussions about plan  of care and coordination of care plan   Heath Lark, MD 04/14/2021 11:08 AM  INTERVAL HISTORY: Please see below for problem oriented charting. She returns for further follow-up She is back on methotrexate for arthritis which she tolerated well She has minimum residual neuropathy from prior treatment She has lost a lot of weight through dietary modification and exercise  SUMMARY OF ONCOLOGIC HISTORY: Oncology History Overview Note  MSI stable   Adenosarcoma of body of uterus ([place])  03/03/2020 Initial Diagnosis   She developed PMB in early April after going through menopause at approximately age 38. She saw her PCP on 4/13 for bleeding and an ultrasound was obtained showing a thickened endometrial lining and 3-4cm mass within the endocervical canal. She was referred to [facility], started on Megace and ultimately underwent a TRH/BSO on 5/18 for abnormal uterine bleeding with resulting anemia, large prolapsing fibroid.    03/06/2020 Imaging   US pelvis Markedly thickened heterogeneous, and hypervascular endometrial complex up to 38 mm thick highly concerning for endometrial neoplasm.   Additional more focal 3.0 x 2.8 x 5.3 cm diameter mass within endocervical canal, may represent extension of above endometrial pathology versus separate endocervical tumor.   Tissue diagnosis recommended.   Nonvisualization of ovaries.   04/07/2020 Pathology Results   A. UTERUS, CERVIX AND BILATERAL FALLOPIAN TUBES AND OVARIES, HYSTERECTOMY AND BILATERAL SALPINGO-OOPHORECTOMY: - Mullerian adenosarcoma of endometrium. - Tumor limited to the uterus. - Tumor invades to less than  half of the myometrium. - No involvement of adnexa. - Margins of resection are not involved. - See oncology table and comment. ONCOLOGY TABLE: UTERUS, SARCOMA: Procedure: Total hysterectomy and bilateral salpingo-oophorectomy Specimen Integrity: The uterus, cervix and bilateral fallopian tubes and ovaries were received intact. The  nodule clinically identified as vaginally prolapsed uterine fibroid was received separate and disrupted. Tumor Size: Greatest dimension: 7.5 cm Histologic Type: Mullerian adenosarcoma with overgrowth of the epithelial and stromal components Histologic Grade: The epithelial component has proliferated to form endometrioid adenocarcinoma which ranges from low grade to high grade. The stromal component focally shows sarcomatous overgrowth. Myometrial Invasion: Present Depth of invasion: 4 mm Myometrial thickness: 30 mm Other Tissue/Organ Involvement: Not identified Margins: Uninvolved by sarcoma Lymphovascular Invasion: Not identified Regional Lymph Nodes: No lymph nodes submitted or found Pathologic Stage Classification (pTNM, AJCC 8th Edition): pT1b, pNX Additional Pathologic Findings: Adenomyosis. Leiomyomata.   04/07/2020 Surgery   PRE-OPERATIVE DIAGNOSIS:  Large prolapsed fibroid vaginally, menometrorrhagia with anemia   POST-OPERATIVE DIAGNOSIS:  Large prolapsed fibroid vaginally, menometrorrhagia with anemia.  Omental adhesions with anterior abdominal wall.   PROCEDURE:  Procedure(s): XI ROBOTIC TOTAL LAPAROSCOPIC HYSTERECTOMY, BILATERAL SALPINGO OOPHORECTOMY/LYSIS OF ADHESIONS/VAGINAL MYOMECTOMY    FINDINGS: Large necrotic vaginally prolapsed uterine myoma.  Uterus with small subserosal/intramural myoma.  Bilateral tubes post tubal ligation.  Bilateral normal ovaries.  Omental adhesions with anterior abdominal wall.   05/01/2020 Imaging   Ct scan of chest, abdomen and pelvis 1. Multiple ill-defined low density liver masses, suspicious for metastases. These could be further characterized with pre and postcontrast magnetic resonance imaging of the liver. 2. Moderate diffuse vaginal wall thickening, possibly representing edema associated with the patient's recent surgery. 3. Small hiatal hernia. 4. Minimal coronary artery atheromatous calcifications. 5. Cholelithiasis. 6. Tiny rounded  area of low density in the posterior spleen, too small to characterize.   05/11/2020 Cancer Staging   Staging form: Corpus Uteri - Adenosarcoma, AJCC 8th Edition - Pathologic: Stage IB (pT1b, pN0, cM0) - Signed by Heath Lark, MD on 05/15/2020   05/14/2020 Imaging   MRI liver 1. The 2 dominant right hepatic lobe lesions have MR imaging features most consistent with benign cavernous hemangioma. The remaining tiny liver lesions are too small to characterize. Statistically, while these are likely benign, follow-up MRI in 3 months recommended to ensure stability. 2. No other findings to suggest metastatic disease.   05/22/2020 Procedure   Placement of a subcutaneous port device. Catheter tip at the superior cavoatrial junction   05/29/2020 - 09/11/2020 Chemotherapy   The patient had carboplatin and taxol for chemotherapy treatment.     07/22/2020 Imaging   1. Status post hysterectomy and bilateral oophorectomy. Irregular soft tissue density in the region of the vaginal cuff is relatively similar to on the prior exam. Although this could represent developing scar or chronic hematoma, residual disease cannot be excluded. This could either be re-evaluated at follow-up or if a more aggressive approach is desired, characterized with PET. 2. No other evidence of metastatic disease in the abdomen or pelvis. 3. Hepatic hemangiomas, as before. Smaller liver lesions are unchanged, favoring a benign etiology. 4. Cholelithiasis. 5. Tiny hiatal hernia. 6. Aortic Atherosclerosis (ICD10-I70.0).   10/12/2020 Imaging   1. Stable appearance of the vaginal cuff status post hysterectomy. Soft tissue fullness appears unchanged, likely postsurgical in etiology. Correlate with physical examination and tumor markers. 2. No evidence of metastatic disease. 3. Stable hepatic hemangiomas. 4. Cholelithiasis without evidence of cholecystitis or biliary dilatation. 5. Aortic Atherosclerosis (  ICD10-I70.0).   04/12/2021 Imaging    1. Status post hysterectomy. Unchanged appearance of somewhat nodular soft tissue at the left and right aspects of the vaginal cuff, again favored to represent postoperative change without specific findings or evidence of progression to suggest local malignant recurrence. 2. No noncontrast evidence of metastatic disease in the abdomen or pelvis. 3. Cholelithiasis.   Aortic Atherosclerosis (ICD10-I70.0).     REVIEW OF SYSTEMS:   Constitutional: Denies fevers, chills or abnormal weight loss Eyes: Denies blurriness of vision Ears, nose, mouth, throat, and face: Denies mucositis or sore throat Respiratory: Denies cough, dyspnea or wheezes Cardiovascular: Denies palpitation, chest discomfort or lower extremity swelling Gastrointestinal:  Denies nausea, heartburn or change in bowel habits Skin: Denies abnormal skin rashes Lymphatics: Denies new lymphadenopathy or easy bruising Neurological:Denies numbness, tingling or new weaknesses Behavioral/Psych: Mood is stable, no new changes  All other systems were reviewed with the patient and are negative.  I have reviewed the past medical history, past surgical history, social history and family history with the patient and they are unchanged from previous note.  ALLERGIES:  is allergic to latex, sulfa antibiotics, and elemental sulfur.  MEDICATIONS:  Current Outpatient Medications  Medication Sig Dispense Refill  . methotrexate (RHEUMATREX) 10 MG tablet Take 20 mg by mouth once a week. Caution: Chemotherapy. Protect from light.    Marland Kitchen albuterol (VENTOLIN HFA) 108 (90 Base) MCG/ACT inhaler Inhale 2 puffs into the lungs every 6 (six) hours as needed for wheezing or shortness of breath. 8 g 2  . Blood Pressure Monitoring (BLOOD PRESSURE MONITOR AUTOMAT) DEVI Use to check Blood pressures daily 1 each 0  . cyclobenzaprine (FLEXERIL) 10 MG tablet Take 1 tablet (10 mg total) by mouth at bedtime as needed for muscle spasms. 15 tablet 0  . famotidine  (PEPCID) 20 MG tablet TAKE 1 TABLET BY MOUTH TWICE DAILY FOR HEART 939 tablet 0  . folic acid (FOLVITE) 1 MG tablet Take 2 mg by mouth daily.    Marland Kitchen ibuprofen (ADVIL) 800 MG tablet Take 1 tablet (800 mg total) by mouth 3 (three) times daily. 30 tablet 2  . losartan-hydrochlorothiazide (HYZAAR) 50-12.5 MG tablet Take 1 tablet by mouth daily. 90 tablet 3  . Multiple Vitamin (MULTIVITAMIN ADULT PO) Take by mouth.    . rosuvastatin (CRESTOR) 5 MG tablet Take 1 tablet (5 mg total) by mouth daily. 90 tablet 3  . triamcinolone (KENALOG) 0.1 % Apply 1 application topically 2 (two) times daily. 30 g 0  . VITAMIN D PO Take by mouth.     No current facility-administered medications for this visit.    PHYSICAL EXAMINATION: ECOG PERFORMANCE STATUS: 0 - Asymptomatic  Vitals:   04/13/21 1339  BP: 140/61  Pulse: 73  Resp: 20  Temp: (!) 97.5 F (36.4 C)  SpO2: 100%   Filed Weights   04/13/21 1339  Weight: 205 lb 9.6 oz (93.3 kg)    GENERAL:alert, no distress and comfortable NEURO: alert & oriented x 3 with fluent speech, no focal motor/sensory deficits  LABORATORY DATA:  I have reviewed the data as listed    Component Value Date/Time   NA 140 04/12/2021 1301   NA 142 01/28/2021 0922   NA 142 04/07/2017 0810   K 3.7 04/12/2021 1301   K 3.8 04/07/2017 0810   CL 102 04/12/2021 1301   CO2 30 04/12/2021 1301   CO2 28 04/07/2017 0810   GLUCOSE 97 04/12/2021 1301   GLUCOSE 100 04/07/2017 0810  BUN 10 04/12/2021 1301   BUN 17 01/28/2021 0922   BUN 16.8 04/07/2017 0810   CREATININE 0.78 04/12/2021 1301   CREATININE 0.9 04/07/2017 0810   CALCIUM 10.3 04/12/2021 1301   CALCIUM 9.9 04/07/2017 0810   PROT 8.0 04/12/2021 1301   PROT 7.6 01/28/2021 0922   PROT 8.0 04/07/2017 0810   ALBUMIN 4.2 04/12/2021 1301   ALBUMIN 4.8 01/28/2021 0922   ALBUMIN 4.4 04/07/2017 0810   AST 15 04/12/2021 1301   AST 18 04/07/2017 0810   ALT 16 04/12/2021 1301   ALT 23 04/07/2017 0810   ALKPHOS 72  04/12/2021 1301   ALKPHOS 79 04/07/2017 0810   BILITOT 0.5 04/12/2021 1301   BILITOT 0.70 04/07/2017 0810   GFRNONAA >60 04/12/2021 1301   GFRAA >60 08/21/2020 0805    No results found for: SPEP, UPEP  Lab Results  Component Value Date   WBC 6.6 04/12/2021   NEUTROABS 3.2 04/12/2021   HGB 10.5 (L) 04/12/2021   HCT 32.6 (L) 04/12/2021   MCV 85.1 04/12/2021   PLT 75 (L) 04/12/2021      Chemistry      Component Value Date/Time   NA 140 04/12/2021 1301   NA 142 01/28/2021 0922   NA 142 04/07/2017 0810   K 3.7 04/12/2021 1301   K 3.8 04/07/2017 0810   CL 102 04/12/2021 1301   CO2 30 04/12/2021 1301   CO2 28 04/07/2017 0810   BUN 10 04/12/2021 1301   BUN 17 01/28/2021 0922   BUN 16.8 04/07/2017 0810   CREATININE 0.78 04/12/2021 1301   CREATININE 0.9 04/07/2017 0810      Component Value Date/Time   CALCIUM 10.3 04/12/2021 1301   CALCIUM 9.9 04/07/2017 0810   ALKPHOS 72 04/12/2021 1301   ALKPHOS 79 04/07/2017 0810   AST 15 04/12/2021 1301   AST 18 04/07/2017 0810   ALT 16 04/12/2021 1301   ALT 23 04/07/2017 0810   BILITOT 0.5 04/12/2021 1301   BILITOT 0.70 04/07/2017 0810       RADIOGRAPHIC STUDIES: I have reviewed multiple imaging studies with the patient I have personally reviewed the radiological images as listed and agreed with the findings in the report. CT Abdomen Pelvis Wo Contrast  Result Date: 04/13/2021 CLINICAL DATA:  Follow-up uterine cancer EXAM: CT ABDOMEN AND PELVIS WITHOUT CONTRAST TECHNIQUE: Multidetector CT imaging of the abdomen and pelvis was performed following the standard protocol without IV contrast. Oral enteric contrast was administered. COMPARISON:  10/12/2020 FINDINGS: Lower chest: No acute abnormality. Hepatobiliary: No noncontrast liver abnormality is seen. Previously described hepatic hemangiomata are not well demonstrated. Rim calcified gallstone in the gallbladder fundus. No gallbladder wall thickening, or biliary dilatation. Pancreas:  Unremarkable. No pancreatic ductal dilatation or surrounding inflammatory changes. Spleen: Normal in size without significant abnormality. Adrenals/Urinary Tract: Adrenal glands are unremarkable. Kidneys are normal, without renal calculi, solid lesion, or hydronephrosis. Thickening of the decompressed urinary bladder. Stomach/Bowel: Stomach is within normal limits. Appendix appears normal. No evidence of bowel wall thickening, distention, or inflammatory changes. Vascular/Lymphatic: Scattered aortic atherosclerosis. No enlarged abdominal or pelvic lymph nodes. Reproductive: Status post hysterectomy. Unchanged appearance of somewhat nodular soft tissue at the left and right aspects of the vaginal cuff (series 2, image 72, 70). Other: No abdominal wall hernia or abnormality. No abdominopelvic ascites. Musculoskeletal: No acute or significant osseous findings. IMPRESSION: 1. Status post hysterectomy. Unchanged appearance of somewhat nodular soft tissue at the left and right aspects of the vaginal cuff, again favored  to represent postoperative change without specific findings or evidence of progression to suggest local malignant recurrence. 2. No noncontrast evidence of metastatic disease in the abdomen or pelvis. 3. Cholelithiasis. Aortic Atherosclerosis (ICD10-I70.0). Electronically Signed   By: Eddie Candle M.D.   On: 04/13/2021 13:03

## 2021-04-14 NOTE — Assessment & Plan Note (Signed)
I have reviewed her blood work and imaging studies She has no signs of residual disease The abnormalities noted in the pelvic floor is likely chronic changes related to postoperative seroma I recommend follow-up with gynecologist in 3 months and I plan to see her in 6 months We discussed the risk and benefits of port removal

## 2021-04-14 NOTE — Assessment & Plan Note (Signed)
She was restarted back on methotrexate for rheumatoid arthritis I would defer to her rheumatologist for management

## 2021-04-16 ENCOUNTER — Ambulatory Visit: Payer: 59 | Admitting: Family Medicine

## 2021-04-20 ENCOUNTER — Encounter (INDEPENDENT_AMBULATORY_CARE_PROVIDER_SITE_OTHER): Payer: Self-pay | Admitting: Family Medicine

## 2021-04-20 ENCOUNTER — Other Ambulatory Visit: Payer: Self-pay

## 2021-04-20 ENCOUNTER — Ambulatory Visit (INDEPENDENT_AMBULATORY_CARE_PROVIDER_SITE_OTHER): Payer: 59 | Admitting: Family Medicine

## 2021-04-20 VITALS — BP 121/74 | HR 64 | Temp 97.9°F | Ht 64.0 in | Wt 201.0 lb

## 2021-04-20 DIAGNOSIS — Z9189 Other specified personal risk factors, not elsewhere classified: Secondary | ICD-10-CM | POA: Diagnosis not present

## 2021-04-20 DIAGNOSIS — R1013 Epigastric pain: Secondary | ICD-10-CM | POA: Diagnosis not present

## 2021-04-20 DIAGNOSIS — K5909 Other constipation: Secondary | ICD-10-CM | POA: Diagnosis not present

## 2021-04-20 DIAGNOSIS — M069 Rheumatoid arthritis, unspecified: Secondary | ICD-10-CM | POA: Diagnosis not present

## 2021-04-20 DIAGNOSIS — E1169 Type 2 diabetes mellitus with other specified complication: Secondary | ICD-10-CM

## 2021-04-20 DIAGNOSIS — Z6835 Body mass index (BMI) 35.0-35.9, adult: Secondary | ICD-10-CM

## 2021-04-21 ENCOUNTER — Encounter (INDEPENDENT_AMBULATORY_CARE_PROVIDER_SITE_OTHER): Payer: Self-pay

## 2021-04-21 NOTE — Progress Notes (Signed)
Chief Complaint:   OBESITY Rachel Rivas is here to discuss her progress with her obesity treatment plan along with follow-up of her obesity related diagnoses. See Medical Weight Management Flowsheet for bioelectrical impedance results.  Today's visit was #: 5 Starting weight: 207 lbs Starting date: 01/28/2021 Today's weight: 201 lbs Today's date: 04/20/2021 Weight change since last visit: 0 Total lbs lost to date: 6 lbs Body mass index is 34.5 kg/m.  Total weight loss percentage to date: -2.90%  Interim History: Rachel Rivas says she has been having more reflux.  She had a CT with contrast last week that showed a large stool burden.  She has increased her water intake. Nutrition Plan: the Category 2 Plan for 70% of the time. Activity: None.  Assessment/Plan:   1. Type 2 diabetes mellitus with other specified complication, without long-term current use of insulin (HCC) Diabetes Mellitus: Not at goal. Medication: None.  Issues reviewed: blood sugar goals, complications of diabetes mellitus, hypoglycemia prevention and treatment, exercise, and nutrition.   Plan: The patient will continue to focus on protein-rich, low simple carbohydrate foods. We reviewed the importance of hydration, regular exercise for stress reduction, and restorative sleep.   Lab Results  Component Value Date   HGBA1C 7.1 (H) 01/06/2021   HGBA1C 6.2 (A) 10/23/2020   HGBA1C 6.2 12/30/2019   Lab Results  Component Value Date   LDLCALC 73 01/06/2021   CREATININE 0.78 04/12/2021   2. Rheumatoid arthritis involving multiple sites [facility]) She is followed by Dr. Amil Amen in Rheumatology. We will continue to monitor symptoms as they relate to her weight loss journey.  3. Other constipation This problem is uncontrolled. Eurika was informed that a decrease in bowel movement frequency is normal while losing weight, but stools should not be hard or painful. A bowel regimen was reviewed today.  4. Dyspepsia We reviewed the  diagnosis of dyspepsia and importance of treatment. We discussed "red flag" symptoms and the importance of follow up if symptoms persisted despite treatment. We reviewed non-pharmacologic management of dyspepsia symptoms: including: caffeine reduction, dietary changes, elevate HOB, NPO after supper, reduction of alcohol intake, tobacco cessation, and weight loss.  5. At risk for heart disease Due to [name]'s current state of health and medical condition(s), she is at a higher risk for heart disease.  This puts the patient at much greater risk to subsequently develop cardiopulmonary conditions that can significantly affect patient's quality of life in a negative manner.    At least 9 minutes were spent on counseling Rachel Rivas about these concerns today. Evidence-based interventions for health behavior change were utilized today including the discussion of self monitoring techniques, problem-solving barriers, and SMART goal setting techniques.  Specifically, regarding patient's less desirable eating habits and patterns, we employed the technique of small changes when [name] has not been able to fully commit to her prudent nutritional plan.  6. Obesity, current BMI 34.6  Course: Rachel Rivas is currently in the action stage of change. As such, her goal is to continue with weight loss efforts.   Nutrition goals: She has agreed to keeping a food journal and adhering to recommended goals of 1000-1200 calories and 65 grams of protein.   Exercise goals: All adults should avoid inactivity. Some physical activity is better than none, and adults who participate in any amount of physical activity gain some health benefits.  Behavioral modification strategies: increasing lean protein intake, decreasing simple carbohydrates and increasing vegetables.  Keigan has agreed to follow-up with our clinic in 3-4  weeks. She was informed of the importance of frequent follow-up visits to maximize her success with intensive lifestyle  modifications for her multiple health conditions.   Objective:   Blood pressure 121/74, pulse 64, temperature 97.9 F (36.6 C), temperature source Oral, height 5\' 4"  (1.626 m), weight 201 lb (91.2 kg), last menstrual period 11/17/2012, SpO2 99 %. Body mass index is 34.5 kg/m.  General: Cooperative, alert, well developed, in no acute distress. HEENT: Conjunctivae and lids unremarkable. Cardiovascular: Regular rhythm.  Lungs: Normal work of breathing. Neurologic: No focal deficits.   Lab Results  Component Value Date   CREATININE 0.78 04/12/2021   BUN 10 04/12/2021   NA 140 04/12/2021   K 3.7 04/12/2021   CL 102 04/12/2021   CO2 30 04/12/2021   Lab Results  Component Value Date   ALT 16 04/12/2021   AST 15 04/12/2021   ALKPHOS 72 04/12/2021   BILITOT 0.5 04/12/2021   Lab Results  Component Value Date   HGBA1C 7.1 (H) 01/06/2021   HGBA1C 6.2 (A) 10/23/2020   HGBA1C 6.2 12/30/2019   HGBA1C 6.1 10/28/2019   HGBA1C 6.0 12/21/2018   Lab Results  Component Value Date   TSH 0.585 01/28/2021   Lab Results  Component Value Date   CHOL 163 01/06/2021   HDL 76.00 01/06/2021   LDLCALC 73 01/06/2021   TRIG 66.0 01/06/2021   CHOLHDL 2 01/06/2021   Lab Results  Component Value Date   WBC 6.6 04/12/2021   HGB 10.5 (L) 04/12/2021   HCT 32.6 (L) 04/12/2021   MCV 85.1 04/12/2021   PLT 75 (L) 04/12/2021   Lab Results  Component Value Date   IRON 50 01/28/2021   TIBC 416 01/28/2021   FERRITIN 63 01/28/2021   Attestation Statements:   Reviewed by clinician on day of visit: allergies, medications, problem list, medical history, surgical history, family history, social history, and previous encounter notes.  I, Water quality scientist, CMA, am acting as transcriptionist for Briscoe Deutscher, DO  I have reviewed the above documentation for accuracy and completeness, and I agree with the above. Briscoe Deutscher, DO

## 2021-04-28 ENCOUNTER — Other Ambulatory Visit: Payer: Self-pay | Admitting: Radiology

## 2021-04-30 ENCOUNTER — Ambulatory Visit (HOSPITAL_COMMUNITY)
Admission: RE | Admit: 2021-04-30 | Discharge: 2021-04-30 | Disposition: A | Discharge status: Home / Self Care | Payer: 59 | Source: Ambulatory Visit | Attending: Hematology and Oncology | Admitting: Hematology and Oncology

## 2021-04-30 ENCOUNTER — Ambulatory Visit: Payer: 59 | Admitting: Family Medicine

## 2021-04-30 ENCOUNTER — Other Ambulatory Visit: Payer: Self-pay

## 2021-04-30 ENCOUNTER — Encounter (HOSPITAL_COMMUNITY): Payer: Self-pay

## 2021-04-30 DIAGNOSIS — C55 Malignant neoplasm of uterus, part unspecified: Secondary | ICD-10-CM | POA: Diagnosis not present

## 2021-04-30 DIAGNOSIS — Z87891 Personal history of nicotine dependence: Secondary | ICD-10-CM | POA: Insufficient documentation

## 2021-04-30 DIAGNOSIS — Z79899 Other long term (current) drug therapy: Secondary | ICD-10-CM | POA: Diagnosis not present

## 2021-04-30 DIAGNOSIS — M069 Rheumatoid arthritis, unspecified: Secondary | ICD-10-CM | POA: Insufficient documentation

## 2021-04-30 DIAGNOSIS — Z91040 Latex allergy status: Secondary | ICD-10-CM | POA: Insufficient documentation

## 2021-04-30 DIAGNOSIS — Z452 Encounter for adjustment and management of vascular access device: Secondary | ICD-10-CM | POA: Insufficient documentation

## 2021-04-30 DIAGNOSIS — Z882 Allergy status to sulfonamides status: Secondary | ICD-10-CM | POA: Diagnosis not present

## 2021-04-30 DIAGNOSIS — C549 Malignant neoplasm of corpus uteri, unspecified: Secondary | ICD-10-CM

## 2021-04-30 DIAGNOSIS — Z9221 Personal history of antineoplastic chemotherapy: Secondary | ICD-10-CM | POA: Diagnosis not present

## 2021-04-30 HISTORY — PX: IR REMOVAL TUN ACCESS W/ PORT W/O FL MOD SED: IMG2290

## 2021-04-30 LAB — CBC WITH DIFFERENTIAL/PLATELET
Abs Immature Granulocytes: 0.02 10*3/uL (ref 0.00–0.07)
Basophils Absolute: 0 10*3/uL (ref 0.0–0.1)
Basophils Relative: 0 %
Eosinophils Absolute: 0.1 10*3/uL (ref 0.0–0.5)
Eosinophils Relative: 2 %
HCT: 33.4 % — ABNORMAL LOW (ref 36.0–46.0)
Hemoglobin: 10.6 g/dL — ABNORMAL LOW (ref 12.0–15.0)
Immature Granulocytes: 0 %
Lymphocytes Relative: 34 %
Lymphs Abs: 2.1 10*3/uL (ref 0.7–4.0)
MCH: 27.6 pg (ref 26.0–34.0)
MCHC: 31.7 g/dL (ref 30.0–36.0)
MCV: 87 fL (ref 80.0–100.0)
Monocytes Absolute: 0.6 10*3/uL (ref 0.1–1.0)
Monocytes Relative: 11 %
Neutro Abs: 3.2 10*3/uL (ref 1.7–7.7)
Neutrophils Relative %: 53 %
Platelets: 62 10*3/uL — ABNORMAL LOW (ref 150–400)
RBC: 3.84 MIL/uL — ABNORMAL LOW (ref 3.87–5.11)
RDW: 15.9 % — ABNORMAL HIGH (ref 11.5–15.5)
WBC: 6.1 10*3/uL (ref 4.0–10.5)
nRBC: 0 % (ref 0.0–0.2)

## 2021-04-30 LAB — PROTIME-INR
INR: 0.9 (ref 0.8–1.2)
Prothrombin Time: 12.5 seconds (ref 11.4–15.2)

## 2021-04-30 LAB — GLUCOSE, CAPILLARY: Glucose-Capillary: 78 mg/dL (ref 70–99)

## 2021-04-30 MED ORDER — LIDOCAINE HCL 1 % IJ SOLN
INTRAMUSCULAR | Status: AC | PRN
  Administered 2021-04-30: 10 mL

## 2021-04-30 MED ORDER — FENTANYL CITRATE (PF) 100 MCG/2ML IJ SOLN
INTRAMUSCULAR | Status: AC
  Filled 2021-04-30: qty 2

## 2021-04-30 MED ORDER — SODIUM CHLORIDE 0.9 % IV SOLN
INTRAVENOUS | Status: DC
Start: 2021-04-30 — End: 2021-05-01

## 2021-04-30 MED ORDER — MIDAZOLAM HCL 2 MG/2ML IJ SOLN
INTRAMUSCULAR | Status: AC | PRN
  Administered 2021-04-30 (×2): 1 mg via INTRAVENOUS

## 2021-04-30 MED ORDER — FENTANYL CITRATE (PF) 100 MCG/2ML IJ SOLN
INTRAMUSCULAR | Status: AC | PRN
  Administered 2021-04-30 (×2): 50 ug via INTRAVENOUS

## 2021-04-30 MED ORDER — MIDAZOLAM HCL 2 MG/2ML IJ SOLN
INTRAMUSCULAR | Status: AC
  Filled 2021-04-30: qty 2

## 2021-04-30 MED ORDER — LIDOCAINE HCL 1 % IJ SOLN
INTRAMUSCULAR | Status: AC
  Filled 2021-04-30: qty 20

## 2021-04-30 NOTE — H&P (Signed)
Chief Complaint: Patient was seen in consultation today for port-a-catheter removal  Referring Physician(s): Banks  Supervising Physician: Aletta Edouard  Patient Status: [facility] - Out-pt  History of Present Illness: Rachel Rivas is a 60 y.o. female with a medical history significant for rheumatoid arthritis and uterine cancer. She is familiar to IR for port-a-catheter placement 05/22/20. She has since completed chemotherapy and recent imaging shows no evidence of disease. Interventional Radiology has been asked to evaluate this patient for port-a-catheter removal.    Past Medical History:  Diagnosis Date   Allergy    occasionally   Anemia    yrs ago, low platelets   Arthritis    RA   Back pain    Blood transfusion without reported diagnosis    1985 with c section   Borderline diabetic    Cancer (HCC)    Dyspnea    While taking Megace   GERD (gastroesophageal reflux disease)    Hypertension    Joint pain    Obstructive sleep apnea syndrome 01/07/2020   Prediabetes    Rheumatoid arthritis ([place])    Sciatica    SOB (shortness of breath)    Thrombocytopenia ([place])     Past Surgical History:  Procedure Laterality Date   CESAREAN SECTION     COLONOSCOPY     IR IMAGING GUIDED PORT INSERTION  05/22/2020   MYOMECTOMY N/A 04/07/2020   Procedure: VAGINAL MYOMECTOMY;  Surgeon: Princess Bruins, MD;  Location: [facility];  Service: Gynecology;  Laterality: N/A;   POLYPECTOMY     ROBOTIC ASSISTED TOTAL HYSTERECTOMY WITH BILATERAL SALPINGO OOPHERECTOMY Bilateral 04/07/2020   Procedure: XI ROBOTIC TOTAL LAPAROSCOPIC HYSTERECTOMY, BILATERAL SALPINGO OOPHORECTOMY;  Surgeon: Princess Bruins, MD;  Location: [facility];  Service: Gynecology;  Laterality: Bilateral;   STERILIZATION     TUBAL LIGATION      Allergies: Latex, Sulfa antibiotics, and Elemental sulfur  Medications: Prior to Admission medications   Medication Sig Start Date End Date  Taking? Authorizing Provider  albuterol (VENTOLIN HFA) 108 (90 Base) MCG/ACT inhaler Inhale 2 puffs into the lungs every 6 (six) hours as needed for wheezing or shortness of breath. 07/31/20   Heath Lark, MD  Blood Pressure Monitoring (BLOOD PRESSURE MONITOR AUTOMAT) DEVI Use to check Blood pressures daily 10/23/20   Bedsole, Amy E, MD  cyclobenzaprine (FLEXERIL) 10 MG tablet Take 1 tablet (10 mg total) by mouth at bedtime as needed for muscle spasms. 10/23/20   Bedsole, Amy E, MD  famotidine (PEPCID) 20 MG tablet TAKE 1 TABLET BY MOUTH TWICE DAILY FOR HEART 11/05/20   Diona Browner, Amy E, MD  folic acid (FOLVITE) 1 MG tablet Take 2 mg by mouth daily. 03/09/21   [provider]  ibuprofen (ADVIL) 800 MG tablet Take 1 tablet (800 mg total) by mouth 3 (three) times daily. 03/17/21   Bedsole, Amy E, MD  losartan-hydrochlorothiazide (HYZAAR) 50-12.5 MG tablet Take 1 tablet by mouth daily. 10/23/20   Bedsole, Amy E, MD  methotrexate (RHEUMATREX) 10 MG tablet Take 20 mg by mouth once a week. Caution: Chemotherapy. Protect from light.    [provider]  Multiple Vitamin (MULTIVITAMIN ADULT PO) Take by mouth.    [provider]  rosuvastatin (CRESTOR) 5 MG tablet Take 1 tablet (5 mg total) by mouth daily. 10/28/20 01/26/21  Buford Dresser, MD  triamcinolone (KENALOG) 0.1 % Apply 1 application topically 2 (two) times daily. 11/06/20   [name], MD  VITAMIN D PO Take by  mouth.    [provider]     Family History  Problem Relation Age of Onset   Lung cancer Mother    Hypertension Mother    Diabetes Mother    Uterine cancer Mother    Cancer Mother        endometrial   Obesity Mother    Hypertension Father    Diabetes Father    Stroke Father    Hyperlipidemia Father    Obesity Father    Colon cancer Neg Hx    Colon polyps Neg Hx    Esophageal cancer Neg Hx    Rectal cancer Neg Hx    Stomach cancer Neg Hx     Social History   Socioeconomic History    Marital status: Married    Spouse name: Danny   Number of children: 2   Years of education: Not on file   Highest education level: Not on file  Occupational History   Occupation: licensed mental health professional resident    Employer: [employer]  Tobacco Use   Smoking status: Former    Packs/day: 0.25    Years: 12.00    Pack years: 3.00    Types: Cigarettes   Smokeless tobacco: Never  Vaping Use   Vaping Use: Never used  Substance and Sexual Activity   Alcohol use: Yes    Alcohol/week: 0.0 standard drinks    Comment: occasional   Drug use: No    Comment: past; marijuana   Sexual activity: Yes    Birth control/protection: Post-menopausal    Comment: 1st intercourse 60 yo-Fewer than 5 partners  Other Topics Concern   Not on file  Social History Narrative   Moved here from [place] two years ago.      Works for [employer].            Social Determinants of Health   Financial Resource Strain: Not on file  Food Insecurity: Not on file  Transportation Needs: Not on file  Physical Activity: Not on file  Stress: Not on file  Social Connections: Not on file    Review of Systems: A 12 point ROS discussed and pertinent positives are indicated in the HPI above.  All other systems are negative.  Review of Systems  Constitutional:  Negative for fatigue and fever.  Respiratory:  Negative for cough and shortness of breath.   Cardiovascular:  Negative for chest pain and leg swelling.  Gastrointestinal:  Negative for abdominal pain, diarrhea, nausea and vomiting.  Neurological:  Negative for dizziness and headaches.   Vital Signs: LMP 11/17/2012   Physical Exam Constitutional:      General: She is not in acute distress. HENT:     Mouth/Throat:     Mouth: Mucous membranes are moist.     Pharynx: Oropharynx is clear.  Cardiovascular:     Rate and Rhythm: Normal rate and regular rhythm.     Pulses: Normal pulses.     Heart sounds: Normal heart sounds.      Comments: Right upper chest port-a-catheter; not accessed. Site is unremarkable.  Pulmonary:     Effort: Pulmonary effort is normal.     Breath sounds: Normal breath sounds.  Abdominal:     General: Bowel sounds are normal.     Palpations: Abdomen is soft.  Skin:    General: Skin is warm and dry.  Neurological:     Mental Status: She is alert and oriented to person, place, and time.    Imaging:  CT Abdomen Pelvis Wo Contrast  Result Date: 04/13/2021 CLINICAL DATA:  Follow-up uterine cancer EXAM: CT ABDOMEN AND PELVIS WITHOUT CONTRAST TECHNIQUE: Multidetector CT imaging of the abdomen and pelvis was performed following the standard protocol without IV contrast. Oral enteric contrast was administered. COMPARISON:  10/12/2020 FINDINGS: Lower chest: No acute abnormality. Hepatobiliary: No noncontrast liver abnormality is seen. Previously described hepatic hemangiomata are not well demonstrated. Rim calcified gallstone in the gallbladder fundus. No gallbladder wall thickening, or biliary dilatation. Pancreas: Unremarkable. No pancreatic ductal dilatation or surrounding inflammatory changes. Spleen: Normal in size without significant abnormality. Adrenals/Urinary Tract: Adrenal glands are unremarkable. Kidneys are normal, without renal calculi, solid lesion, or hydronephrosis. Thickening of the decompressed urinary bladder. Stomach/Bowel: Stomach is within normal limits. Appendix appears normal. No evidence of bowel wall thickening, distention, or inflammatory changes. Vascular/Lymphatic: Scattered aortic atherosclerosis. No enlarged abdominal or pelvic lymph nodes. Reproductive: Status post hysterectomy. Unchanged appearance of somewhat nodular soft tissue at the left and right aspects of the vaginal cuff (series 2, image 72, 70). Other: No abdominal wall hernia or abnormality. No abdominopelvic ascites. Musculoskeletal: No acute or significant osseous findings. IMPRESSION: 1. Status post hysterectomy.  Unchanged appearance of somewhat nodular soft tissue at the left and right aspects of the vaginal cuff, again favored to represent postoperative change without specific findings or evidence of progression to suggest local malignant recurrence. 2. No noncontrast evidence of metastatic disease in the abdomen or pelvis. 3. Cholelithiasis. Aortic Atherosclerosis (ICD10-I70.0). Electronically Signed   By: Eddie Candle M.D.   On: 04/13/2021 13:03    Labs:  CBC: Recent Labs    10/12/20 0830 01/28/21 0922 04/12/21 1301 04/30/21 1045  WBC 5.1 5.2 6.6 6.1  HGB 9.9* 11.5 10.5* 10.6*  HCT 30.5* 35.1 32.6* 33.4*  PLT 61* CANCELED 75* 62*    COAGS: Recent Labs    05/22/20 0819 04/30/21 1045  INR 1.0 0.9    BMP: Recent Labs    06/16/20 1125 07/10/20 0822 07/31/20 0801 08/21/20 0805 09/11/20 0915 10/12/20 0830 01/06/21 0801 01/28/21 0922 04/12/21 1301  NA 142 140 139 136 140 137 140 142 140  K 3.6 3.7 3.8 3.6 3.6 3.5 3.6 3.7 3.7  CL 106 102 104 101 104 101 102 100 102  CO2 27 26 24 26 26 28 30 24 30   GLUCOSE 113* 165* 217* 272* 187* 138* 139* 102* 97  BUN 15 16 16 20 17 15 14 17 10   CALCIUM 10.2 10.5* 9.9 10.1 10.3 9.6 9.4 9.8 10.3  CREATININE 0.88 0.87 1.03* 0.99 0.85 0.93 0.80 0.86 0.78  GFRNONAA >60 >60 59* >60 >60 >60  --   --  >60  GFRAA >60 >60 >60 >60  --   --   --   --   --     LIVER FUNCTION TESTS: Recent Labs    10/12/20 0830 01/06/21 0801 01/28/21 0922 04/12/21 1301  BILITOT 0.5 0.5 0.5 0.5  AST 16 14 17 15   ALT 22 14 15 16   ALKPHOS 82 60 72 72  PROT 7.5 7.4 7.6 8.0  ALBUMIN 4.0 4.2 4.8 4.2    TUMOR MARKERS: No results for input(s): AFPTM, CEA, CA199, CHROMGRNA in the last 8760 hours.  Assessment and Plan:  History of uterine cancer; chemotherapy complete: Rachel Rivas, 60 year old female, presents today to the [facility] department for port-a-catheter removal.   Risks and benefits of port-a-catheter removal were discussed  with the patient including, but not limited to bleeding and infection.  All of the patient's questions were answered, patient is agreeable to proceed.  Consent signed and in chart.  Thank you for this interesting consult.  I greatly enjoyed meeting Rachel Rivas and look forward to participating in their care.  A copy of this report was sent to the requesting provider on this date.  Electronically Signed: [name], AGACNP-BC [phone] 04/30/2021, 11:43 AM   I spent a total of  30 Minutes   in face to face in clinical consultation, greater than 50% of which was counseling/coordinating care for port-a-catheter removal.

## 2021-04-30 NOTE — Procedures (Signed)
Interventional Radiology Procedure Note  Procedure: Port removal  Complications: None  Estimated Blood Loss: 10 mL  Findings: Right chest port removed in entirety. Port pocket flushed and wound closed.  Venetia Night. Kathlene Cote, M.D Pager:  [phone]

## 2021-04-30 NOTE — Progress Notes (Signed)
Patient presented to [facility] IR for port-a-catheter removal. Patient states she is scheduled to see her PCP later today for evaluation of diabetes and that she was to have hemoglobin A1C drawn. Patient had labs drawn today for IR procedure. Patient requested to have A1C added on to the lab work if possible. The lab was contacted and they stated this lab could be added on to the current blood in the lab. Hemoglobin A1C ordered, patient notified.  [name], [place] [phone] 04/30/2021, 12:08 PM

## 2021-04-30 NOTE — Discharge Instructions (Signed)
Please call Interventional Radiology clinic [phone] with any questions or concerns.  You may remove your dressing and shower tomorrow.   Implanted Port Removal, Care After This sheet gives you information about how to care for yourself after your procedure. Your health care provider may also give you more specific instructions. If you have problems or questions, contact your health care provider. What can I expect after the procedure? After the procedure, it is common to have: Soreness or pain near your incision. Some swelling or bruising near your incision. Follow these instructions at home: Medicines Take over-the-counter and prescription medicines only as told by your health care provider. If you were prescribed an antibiotic medicine, take it as told by your health care provider. Do not stop taking the antibiotic even if you start to feel better. Bathing Do not take baths, swim, or use a hot tub until your health care provider approves. Ask your health care provider if you can take showers. You may only be allowed to take sponge baths. Incision care Follow instructions from your health care provider about how to take care of your incision. Make sure you: Wash your hands with soap and water before you change your bandage (dressing). If soap and water are not available, use hand sanitizer. Change your dressing as told by your health care provider. Keep your dressing dry. Leave stitches (sutures), skin glue, or adhesive strips in place. These skin closures may need to stay in place for 2 weeks or longer. If adhesive strip edges start to loosen and curl up, you may trim the loose edges. Do not remove adhesive strips completely unless your health care provider tells you to do that. Check your incision area every day for signs of infection. Check for: More redness, swelling, or pain. More fluid or blood. Warmth. Pus or a bad smell.    Driving Do not drive for 24 hours if you were  given a medicine to help you relax (sedative) during your procedure. If you did not receive a sedative, ask your health care provider when it is safe to drive.    Activity Return to your normal activities as told by your health care provider. Ask your health care provider what activities are safe for you. Do not lift anything that is heavier than 10 lb (4.5 kg), or the limit that you are told, until your health care provider says that it is safe. Do not do activities that involve lifting your arms over your head. General instructions Do not use any products that contain nicotine or tobacco, such as cigarettes and e-cigarettes. These can delay healing. If you need help quitting, ask your health care provider. Keep all follow-up visits as told by your health care provider. This is important. Contact a health care provider if: You have more redness, swelling, or pain around your incision. You have more fluid or blood coming from your incision. Your incision feels warm to the touch. You have pus or a bad smell coming from your incision. You have pain that is not relieved by your pain medicine. Get help right away if you have: A fever or chills. Chest pain. Difficulty breathing. Summary After the procedure, it is common to have pain, soreness, swelling, or bruising near your incision. If you were prescribed an antibiotic medicine, take it as told by your health care provider. Do not stop taking the antibiotic even if you start to feel better. Do not drive for 24 hours if you were given a sedative during   your procedure. Return to your normal activities as told by your health care provider. Ask your health care provider what activities are safe for you. This information is not intended to replace advice given to you by your health care provider. Make sure you discuss any questions you have with your health care provider. Document Revised: 12/21/2017 Document Reviewed: 12/21/2017 Elsevier Patient  Education  2021 Elsevier Inc.   Moderate Conscious Sedation, Adult, Care After This sheet gives you information about how to care for yourself after your procedure. Your health care provider may also give you more specific instructions. If you have problems or questions, contact your health care provider. What can I expect after the procedure? After the procedure, it is common to have: Sleepiness for several hours. Impaired judgment for several hours. Difficulty with balance. Vomiting if you eat too soon. Follow these instructions at home: For the time period you were told by your health care provider: Rest. Do not participate in activities where you could fall or become injured. Do not drive or use machinery. Do not drink alcohol. Do not take sleeping pills or medicines that cause drowsiness. Do not make important decisions or sign legal documents. Do not take care of children on your own.        Eating and drinking Follow the diet recommended by your health care provider. Drink enough fluid to keep your urine pale yellow. If you vomit: Drink water, juice, or soup when you can drink without vomiting. Make sure you have little or no nausea before eating solid foods.    General instructions Take over-the-counter and prescription medicines only as told by your health care provider. Have a responsible adult stay with you for the time you are told. It is important to have someone help care for you until you are awake and alert. Do not smoke. Keep all follow-up visits as told by your health care provider. This is important. Contact a health care provider if: You are still sleepy or having trouble with balance after 24 hours. You feel light-headed. You keep feeling nauseous or you keep vomiting. You develop a rash. You have a fever. You have redness or swelling around the IV site. Get help right away if: You have trouble breathing. You have new-onset confusion at  home. Summary After the procedure, it is common to feel sleepy, have impaired judgment, or feel nauseous if you eat too soon. Rest after you get home. Know the things you should not do after the procedure. Follow the diet recommended by your health care provider and drink enough fluid to keep your urine pale yellow. Get help right away if you have trouble breathing or new-onset confusion at home. This information is not intended to replace advice given to you by your health care provider. Make sure you discuss any questions you have with your health care provider. Document Revised: 03/06/2020 Document Reviewed: 10/03/2019 Elsevier Patient Education  2021 Elsevier Inc.   

## 2021-05-01 LAB — HEMOGLOBIN A1C
Hgb A1c MFr Bld: 6 % — ABNORMAL HIGH (ref 4.8–5.6)
Mean Plasma Glucose: 126 mg/dL

## 2021-05-04 ENCOUNTER — Telehealth: Payer: Self-pay

## 2021-05-04 NOTE — Telephone Encounter (Signed)
Called and given below message. She verbalized understanding. 

## 2021-05-04 NOTE — Telephone Encounter (Signed)
She called and left a message. Concerned about platelet count of 62 on 6/10 with port removal.  She is asking what she should do? She used to take oral iron prior to treatment, she is asking if she should resume?

## 2021-05-04 NOTE — Telephone Encounter (Signed)
She has chronic ITP Her platelet count will not get better with iron For removal of port, 60 is ok

## 2021-05-05 ENCOUNTER — Telehealth: Payer: Self-pay | Admitting: Oncology

## 2021-05-05 ENCOUNTER — Ambulatory Visit (INDEPENDENT_AMBULATORY_CARE_PROVIDER_SITE_OTHER): Payer: 59 | Admitting: Family Medicine

## 2021-05-05 NOTE — Telephone Encounter (Signed)
Rachel Rivas and scheduled follow up with Dr. Denman George on 07/16/21 per Dr. Alvy Bimler.

## 2021-05-10 ENCOUNTER — Encounter (INDEPENDENT_AMBULATORY_CARE_PROVIDER_SITE_OTHER): Payer: Self-pay | Admitting: Family Medicine

## 2021-05-10 ENCOUNTER — Encounter: Payer: Self-pay | Admitting: Hematology and Oncology

## 2021-05-10 ENCOUNTER — Ambulatory Visit (INDEPENDENT_AMBULATORY_CARE_PROVIDER_SITE_OTHER): Payer: 59 | Admitting: Family Medicine

## 2021-05-10 ENCOUNTER — Other Ambulatory Visit: Payer: Self-pay

## 2021-05-10 VITALS — BP 115/68 | HR 63 | Temp 98.1°F | Ht 64.0 in | Wt 199.0 lb

## 2021-05-10 DIAGNOSIS — E1169 Type 2 diabetes mellitus with other specified complication: Secondary | ICD-10-CM

## 2021-05-10 DIAGNOSIS — Z9189 Other specified personal risk factors, not elsewhere classified: Secondary | ICD-10-CM

## 2021-05-10 DIAGNOSIS — E785 Hyperlipidemia, unspecified: Secondary | ICD-10-CM

## 2021-05-10 DIAGNOSIS — M5136 Other intervertebral disc degeneration, lumbar region: Secondary | ICD-10-CM | POA: Insufficient documentation

## 2021-05-10 DIAGNOSIS — K5904 Chronic idiopathic constipation: Secondary | ICD-10-CM | POA: Diagnosis not present

## 2021-05-10 DIAGNOSIS — Z6835 Body mass index (BMI) 35.0-35.9, adult: Secondary | ICD-10-CM

## 2021-05-10 NOTE — Progress Notes (Signed)
Chief Complaint:   OBESITY Rachel Rivas is here to discuss her progress with her obesity treatment plan along with follow-up of her obesity related diagnoses. See Medical Weight Management Flowsheet for bioelectrical impedance results.  Today's visit was #: 6 Starting weight: 207 lbs Starting date: 01/28/2021 Today's weight: 199 lbs Today's date: 05/10/2021 Weight change since last visit: 2 lbs Total lbs lost to date: 8 lbs Body mass index is 34.16 kg/m.  Total weight loss percentage to date: -3.86%  Interim History: Rachel Rivas has increased her walking before leaving for work.  She is still commuting 1 hour each way.  She has testing coming up, so more stressed.  She admits to needing to drink more water.  Nutrition Plan: keeping a food journal and adhering to recommended goals of 1000-1200 calories and 65 grams of protein for 75-80% of the time. Activity:  Walking for 30 minutes 4 times per week.  Assessment/Plan:   1. Type 2 diabetes mellitus with other specified complication, without long-term current use of insulin (HCC) Diabetes Mellitus:  Improving.  Issues reviewed: blood sugar goals, complications of diabetes mellitus, hypoglycemia prevention and treatment, exercise, and nutrition.   Plan: The patient will continue to focus on protein-rich, low simple carbohydrate foods. We reviewed the importance of hydration, regular exercise for stress reduction, and restorative sleep.   Lab Results  Component Value Date   HGBA1C 6.0 (H) 04/30/2021   HGBA1C 7.1 (H) 01/06/2021   HGBA1C 6.2 (A) 10/23/2020   Lab Results  Component Value Date   LDLCALC 73 01/06/2021   CREATININE 0.78 04/12/2021   2. Hyperlipidemia associated with type 2 diabetes mellitus (Rachel Rivas) Course: At goal. Lipid-lowering medications: Crestor 5 mg daily.   Plan: Dietary changes: Increase soluble fiber, decrease simple carbohydrates, decrease saturated fat. Exercise changes: Moderate to vigorous-intensity aerobic  activity 150 minutes per week or as tolerated. We will continue to monitor along with PCP/specialists as it pertains to her weight loss journey.  Lab Results  Component Value Date   CHOL 163 01/06/2021   HDL 76.00 01/06/2021   LDLCALC 73 01/06/2021   TRIG 66.0 01/06/2021   CHOLHDL 2 01/06/2021   Lab Results  Component Value Date   ALT 16 04/12/2021   AST 15 04/12/2021   ALKPHOS 72 04/12/2021   BILITOT 0.5 04/12/2021   The 10-year ASCVD risk score Rachel Rivas Jr., et al., 2013) is: 8.8%   Values used to calculate the score:     Age: 59 years     Sex: Female     Is Non-Hispanic African American: Yes     Diabetic: Yes     Tobacco smoker: No     Systolic Blood Pressure: 884 mmHg     Is BP treated: Yes     HDL Cholesterol: 76 mg/dL     Total Cholesterol: 163 mg/dL  3. Chronic idiopathic constipation This problem is improving with increased fiber drink. Rachel Rivas was informed that a decrease in bowel movement frequency is normal while losing weight, but stools should not be hard or painful.  Counseling: Getting to Good Bowel Health: Your goal is to have one soft bowel movement each day. Drink at least 8 glasses of water each day. Eat plenty of fiber (goal is over 30 grams each day). It is best to get most of your fiber from dietary sources which includes leafy green vegetables, fresh fruit, and whole grains. You may need to add fiber with the help of OTC fiber supplements. These include  Metamucil, Citrucel, and Benefiber. If you are still having trouble, try adding an osmotic laxative such as Miralax. If all of these changes do not work, Rachel Rivas.   4. At risk for heart disease Due to Rachel Rivas's current state of health and medical condition(s), she is at a higher risk for heart disease.  This puts the patient at much greater risk to subsequently develop cardiopulmonary conditions that can significantly affect patient's quality of life in a negative manner.    At least 8 minutes  were spent on counseling Rachel Rivas today. Evidence-based interventions for health behavior change were utilized today including the discussion of self monitoring techniques, problem-solving barriers, and SMART goal setting techniques.  Specifically, regarding patient's less desirable eating habits and patterns, we employed the technique of small changes when Rachel Rivas has not been able to fully commit to her prudent nutritional plan.  5. Obesity, current BMI 35.4  Course: Rachel Rivas is currently in the action stage of change. As such, her goal is to continue with weight loss efforts.   Nutrition goals: She has agreed to keeping a food journal and adhering to recommended goals of 1000-1200 calories and 65 protein.   Exercise goals:  As is.  Behavioral modification strategies: increasing lean protein intake, decreasing simple carbohydrates, increasing vegetables, increasing water intake, and decreasing liquid calories.  Rachel Rivas has agreed to follow-up with our clinic in 3-4 weeks.  Objective:   Blood pressure 115/68, pulse 63, temperature 98.1 F (36.7 C), temperature source Oral, height 5\' 4"  (1.626 m), weight 199 lb (90.3 kg), last menstrual period 11/17/2012, SpO2 97 %. Body mass index is 34.16 kg/m.  General: Cooperative, alert, well developed, in no acute distress. HEENT: Conjunctivae and lids unremarkable. Cardiovascular: Regular rhythm.  Lungs: Normal work of breathing. Neurologic: No focal deficits.   Lab Results  Component Value Date   CREATININE 0.78 04/12/2021   BUN 10 04/12/2021   NA 140 04/12/2021   K 3.7 04/12/2021   CL 102 04/12/2021   CO2 30 04/12/2021   Lab Results  Component Value Date   ALT 16 04/12/2021   AST 15 04/12/2021   ALKPHOS 72 04/12/2021   BILITOT 0.5 04/12/2021   Lab Results  Component Value Date   HGBA1C 6.0 (H) 04/30/2021   HGBA1C 7.1 (H) 01/06/2021   HGBA1C 6.2 (A) 10/23/2020   HGBA1C 6.2 12/30/2019   HGBA1C 6.1 10/28/2019    Lab Results  Component Value Date   TSH 0.585 01/28/2021   Lab Results  Component Value Date   CHOL 163 01/06/2021   HDL 76.00 01/06/2021   LDLCALC 73 01/06/2021   TRIG 66.0 01/06/2021   CHOLHDL 2 01/06/2021   Lab Results  Component Value Date   WBC 6.1 04/30/2021   HGB 10.6 (L) 04/30/2021   HCT 33.4 (L) 04/30/2021   MCV 87.0 04/30/2021   PLT 62 (L) 04/30/2021   Lab Results  Component Value Date   IRON 50 01/28/2021   TIBC 416 01/28/2021   FERRITIN 63 01/28/2021   Attestation Statements:   Reviewed by clinician on day of visit: allergies, medications, problem list, medical history, surgical history, family history, social history, and previous encounter notes.  I, Water quality scientist, CMA, am acting as transcriptionist for Briscoe Deutscher, DO  I have reviewed the above documentation for accuracy and completeness, and I agree with the above. Briscoe Deutscher, DO

## 2021-05-21 ENCOUNTER — Encounter: Payer: Self-pay | Admitting: Hematology and Oncology

## 2021-05-28 ENCOUNTER — Telehealth (INDEPENDENT_AMBULATORY_CARE_PROVIDER_SITE_OTHER): Payer: 59 | Admitting: Family Medicine

## 2021-05-28 ENCOUNTER — Encounter: Payer: Self-pay | Admitting: Family Medicine

## 2021-05-28 VITALS — Ht 64.0 in | Wt 198.0 lb

## 2021-05-28 DIAGNOSIS — J4521 Mild intermittent asthma with (acute) exacerbation: Secondary | ICD-10-CM | POA: Diagnosis not present

## 2021-05-28 MED ORDER — AZITHROMYCIN 250 MG PO TABS
ORAL_TABLET | ORAL | 0 refills | Status: DC
Start: 2021-05-28 — End: 2021-06-21

## 2021-05-28 MED ORDER — PREDNISONE 10 MG PO TABS: 10.0000 mg | ORAL_TABLET | Freq: Every day | ORAL | 0 refills | Status: DC

## 2021-05-28 NOTE — Progress Notes (Signed)
VIRTUAL VISIT Due to national recommendations of social distancing due to [place] 19, a virtual visit is felt to be most appropriate for this patient at this time.   I connected with the patient on 05/28/21 at 12:00 PM EDT by virtual telehealth platform and verified that I am speaking with the correct person using two identifiers.   I discussed the limitations, risks, security and privacy concerns of performing an evaluation and management service by  virtual telehealth platform and the availability of in person appointments. I also discussed with the patient that there may be a patient responsible charge related to this service. The patient expressed understanding and agreed to proceed.  Patient location: Home Provider Location: [place] Participants: Eliezer Lofts and [name]   Chief Complaint  Patient presents with   Sore Throat   Nasal Congestion   Cough    History of Present Illness:  60 year old female patient with HTN, Obesity, DM, asthma presents with 1 week of nasal congestion.  Progressed to fatigue, to scratchy sore throat and coughing.  Occ wheeze... using albuterol prn. No SOB unless moving around a lot. Frontal sinus pain and left ear pain.  No fever, no chills.  Has been treated with Nyquil , Tylenol.    COVID 19 screen COVID testing: home test 7/3 COVID vaccine:12/11/2020 , 07/31/2020 , 07/10/2020 COVID exposure: No recent travel or known exposure to [place]  The importance of social distancing was discussed today.   Review of Systems  All other systems reviewed and are negative.    Past Medical History:  Diagnosis Date   Allergy    occasionally   Anemia    yrs ago, low platelets   Arthritis    RA   Back pain    Blood transfusion without reported diagnosis    1985 with c section   Borderline diabetic    Cancer (HCC)    Dyspnea    While taking Megace   GERD (gastroesophageal reflux disease)    Hypertension    Joint pain    Obstructive  sleep apnea syndrome 01/07/2020   Prediabetes    Rheumatoid arthritis ([place])    Sciatica    SOB (shortness of breath)    Thrombocytopenia ([place])     reports that she has quit smoking. Her smoking use included cigarettes. She has a 3.00 pack-year smoking history. She has never used smokeless tobacco. She reports current alcohol use. She reports that she does not use drugs.   Current Outpatient Medications:    albuterol (VENTOLIN HFA) 108 (90 Base) MCG/ACT inhaler, Inhale 2 puffs into the lungs every 6 (six) hours as needed for wheezing or shortness of breath., Disp: 8 g, Rfl: 2   cyclobenzaprine (FLEXERIL) 10 MG tablet, Take 1 tablet (10 mg total) by mouth at bedtime as needed for muscle spasms., Disp: 15 tablet, Rfl: 0   famotidine (PEPCID) 20 MG tablet, TAKE 1 TABLET BY MOUTH TWICE DAILY FOR HEART, Disp: 180 tablet, Rfl: 0   folic acid (FOLVITE) 1 MG tablet, Take 2 mg by mouth daily., Disp: , Rfl:    ibuprofen (ADVIL) 800 MG tablet, Take 1 tablet (800 mg total) by mouth 3 (three) times daily., Disp: 30 tablet, Rfl: 2   losartan-hydrochlorothiazide (HYZAAR) 50-12.5 MG tablet, Take 1 tablet by mouth daily., Disp: 90 tablet, Rfl: 3   methotrexate (RHEUMATREX) 10 MG tablet, Take 20 mg by mouth once a week. Caution: Chemotherapy. Protect from light., Disp: , Rfl:    Multiple  Vitamin (MULTIVITAMIN ADULT PO), Take by mouth., Disp: , Rfl:    triamcinolone (KENALOG) 0.1 %, Apply 1 application topically 2 (two) times daily., Disp: 30 g, Rfl: 0   VITAMIN D PO, Take by mouth., Disp: , Rfl:    rosuvastatin (CRESTOR) 5 MG tablet, Take 1 tablet (5 mg total) by mouth daily., Disp: 90 tablet, Rfl: 3   Observations/Objective: Height 5\' 4"  (1.626 m), weight 198 lb (89.8 kg), last menstrual period 11/17/2012.  Physical Exam  Physical Exam Constitutional:      General: The patient is not in acute distress. Pulmonary:     Effort: Pulmonary effort is normal. No respiratory distress.  Neurological:     Mental  Status: The patient is alert and oriented to person, place, and time.  Psychiatric:        Mood and Affect: Mood normal.        Behavior: Behavior normal.   Assessment and Plan    Problem List Items Addressed This Visit     Mild intermittent asthma with exacerbation - Primary    Triple vaccinated.  Negative COVID testing. No known exposure.  Bacteria vs allergic vs viral cause of asthma exacerbation. Treat with prednisone taper, start antibiotic course but repeat COVID testing if not improving as expected.  Pt instructed to call  With positive test as she would be a good candidate for  COVID antivirals.       I discussed the assessment and treatment plan with the patient. The patient was provided an opportunity to ask questions and all were answered. The patient agreed with the plan and demonstrated an understanding of the instructions.   The patient was advised to call back or seek an in-person evaluation if the symptoms worsen or if the condition fails to improve as anticipated.     Eliezer Lofts, MD

## 2021-05-31 ENCOUNTER — Ambulatory Visit (INDEPENDENT_AMBULATORY_CARE_PROVIDER_SITE_OTHER): Payer: 59 | Admitting: Family Medicine

## 2021-06-01 ENCOUNTER — Ambulatory Visit (INDEPENDENT_AMBULATORY_CARE_PROVIDER_SITE_OTHER): Payer: 59 | Admitting: Family Medicine

## 2021-06-10 ENCOUNTER — Ambulatory Visit: Payer: Self-pay | Admitting: Obstetrics & Gynecology

## 2021-06-21 ENCOUNTER — Ambulatory Visit (INDEPENDENT_AMBULATORY_CARE_PROVIDER_SITE_OTHER): Payer: 59 | Admitting: Family Medicine

## 2021-06-21 ENCOUNTER — Encounter: Payer: Self-pay | Admitting: Hematology and Oncology

## 2021-06-21 ENCOUNTER — Other Ambulatory Visit: Payer: Self-pay

## 2021-06-21 ENCOUNTER — Encounter (INDEPENDENT_AMBULATORY_CARE_PROVIDER_SITE_OTHER): Payer: Self-pay | Admitting: Family Medicine

## 2021-06-21 VITALS — BP 112/69 | HR 66 | Temp 98.1°F | Ht 64.0 in | Wt 201.0 lb

## 2021-06-21 DIAGNOSIS — E66812 Obesity, class 2: Secondary | ICD-10-CM

## 2021-06-21 DIAGNOSIS — Z9989 Dependence on other enabling machines and devices: Secondary | ICD-10-CM

## 2021-06-21 DIAGNOSIS — Z9189 Other specified personal risk factors, not elsewhere classified: Secondary | ICD-10-CM | POA: Diagnosis not present

## 2021-06-21 DIAGNOSIS — G4733 Obstructive sleep apnea (adult) (pediatric): Secondary | ICD-10-CM

## 2021-06-21 DIAGNOSIS — J4521 Mild intermittent asthma with (acute) exacerbation: Secondary | ICD-10-CM

## 2021-06-21 DIAGNOSIS — E1169 Type 2 diabetes mellitus with other specified complication: Secondary | ICD-10-CM

## 2021-06-21 DIAGNOSIS — Z6835 Body mass index (BMI) 35.0-35.9, adult: Secondary | ICD-10-CM

## 2021-06-21 MED ORDER — FLUTICASONE PROPIONATE HFA 110 MCG/ACT IN AERO: 1.0000 | INHALATION_SPRAY | Freq: Two times a day (BID) | RESPIRATORY_TRACT | 12 refills | Status: DC

## 2021-06-21 NOTE — Progress Notes (Signed)
Chief Complaint:   OBESITY Rachel Rivas is here to discuss her progress with her obesity treatment plan along with follow-up of her obesity related diagnoses. See Medical Weight Management Flowsheet for complete bioelectrical impedance results.  Today's visit was #: 7 Starting weight: 207 lbs Starting date: 01/28/2021 Today's weight: 201 lbs Today's date: 06/21/2021 Weight change since last visit: +2 lbs Total lbs lost to date: 6 lbs Body mass index is 34.5 kg/m.  Total weight loss percentage to date: -2.90%  Interim History:  Arnela was on a recent prednisone burst x 2 and antibiotics.  She says she is still feeling some shortness of breath, exercise intolerance.  Nutrition Plan: keeping a food journal and adhering to recommended goals of 1000-1200 calories and 65 grams of protein for 60% of the time. Activity:  None.  Assessment/Plan:   1. Mild intermittent asthma with exacerbation Rachel Rivas is has an albuterol inhaler that she is requiring daily at this time.  Plan:  Will start Flovent 1 puff twice daily.  - Start fluticasone (FLOVENT HFA) 110 MCG/ACT inhaler; Inhale 1 puff into the lungs in the morning and at bedtime.  Dispense: 1 each; Refill: 12  2. Type 2 diabetes mellitus with other specified complication, without long-term current use of insulin (HCC) Diabetes Mellitus: Not at goal. Medication: None.   Plan: The patient will continue to focus on protein-rich, low simple carbohydrate foods. We reviewed the importance of hydration, regular exercise for stress reduction, and restorative sleep.   Lab Results  Component Value Date   HGBA1C 6.0 (H) 04/30/2021   HGBA1C 7.1 (H) 01/06/2021   HGBA1C 6.2 (A) 10/23/2020   Lab Results  Component Value Date   LDLCALC 73 01/06/2021   CREATININE 0.78 04/12/2021   3. OSA on CPAP OSA is a cause of systemic hypertension and is associated with an increased incidence of stroke, heart failure, atrial fibrillation, and coronary heart  disease. Severe OSA increases all-cause mortality and cardiovascular mortality.   Goal: Treatment of OSA via CPAP compliance and weight loss. Plasma ghrelin levels (appetite or "hunger hormone") are significantly higher in OSA patients than in BMI-matched controls, but decrease to levels similar to those of obese patients without OSA after CPAP treatment.  Weight loss improves OSA by several mechanisms, including reduction in fatty tissue in the throat (i.e. parapharyngeal fat) and the tongue. Loss of abdominal fat increases mediastinal traction on the upper airway making it less likely to collapse during sleep. Studies have also shown that compliance with CPAP treatment improves leptin (hunger inhibitory hormone) imbalance.  4. At risk for activity intolerance Rachel Rivas was given approximately 8 minutes of counseling today regarding her increased risk for exercise intolerance.  We discussed patient's specific personal and medical issues that raise our concern.  She was advised of strategies to prevent injury and ways to improve her cardiopulmonary fitness levels slowly over time.    5. Obesity with current BMI 34.6  Course: Rachel Rivas is currently in the action stage of change. As such, her goal is to continue with weight loss efforts.   Nutrition goals: She has agreed to keeping a food journal and adhering to recommended goals of 1000-1200 calories and 65 grams of protein.   Exercise goals:  Increase activity as tolerated.  Behavioral modification strategies: increasing lean protein intake, decreasing simple carbohydrates, increasing vegetables, and increasing water intake.  [name] has agreed to follow-up with our clinic in 3 weeks. She was informed of the importance of frequent follow-up visits to  maximize her success with intensive lifestyle modifications for her multiple health conditions.   Objective:   Blood pressure 112/69, pulse 66, temperature 98.1 F (36.7 C), temperature source Oral,  height '5\' 4"'$  (1.626 m), weight 201 lb (91.2 kg), last menstrual period 11/17/2012, SpO2 97 %. Body mass index is 34.5 kg/m.  General: Cooperative, alert, well developed, in no acute distress. HEENT: Conjunctivae and lids unremarkable. Cardiovascular: Regular rhythm.  Lungs: Normal work of breathing. Neurologic: No focal deficits.   Lab Results  Component Value Date   CREATININE 0.78 04/12/2021   BUN 10 04/12/2021   NA 140 04/12/2021   K 3.7 04/12/2021   CL 102 04/12/2021   CO2 30 04/12/2021   Lab Results  Component Value Date   ALT 16 04/12/2021   AST 15 04/12/2021   ALKPHOS 72 04/12/2021   BILITOT 0.5 04/12/2021   Lab Results  Component Value Date   HGBA1C 6.0 (H) 04/30/2021   HGBA1C 7.1 (H) 01/06/2021   HGBA1C 6.2 (A) 10/23/2020   HGBA1C 6.2 12/30/2019   HGBA1C 6.1 10/28/2019   Lab Results  Component Value Date   TSH 0.585 01/28/2021   Lab Results  Component Value Date   CHOL 163 01/06/2021   HDL 76.00 01/06/2021   LDLCALC 73 01/06/2021   TRIG 66.0 01/06/2021   CHOLHDL 2 01/06/2021   Lab Results  Component Value Date   VD25OH 34.5 01/28/2021   Lab Results  Component Value Date   WBC 6.1 04/30/2021   HGB 10.6 (L) 04/30/2021   HCT 33.4 (L) 04/30/2021   MCV 87.0 04/30/2021   PLT 62 (L) 04/30/2021   Lab Results  Component Value Date   IRON 50 01/28/2021   TIBC 416 01/28/2021   FERRITIN 63 01/28/2021   Attestation Statements:   Reviewed by clinician on day of visit: allergies, medications, problem list, medical history, surgical history, family history, social history, and previous encounter notes.  I, Water quality scientist, CMA, am acting as transcriptionist for Briscoe Deutscher, DO  I have reviewed the above documentation for accuracy and completeness, and I agree with the above. Briscoe Deutscher, DO

## 2021-07-09 ENCOUNTER — Encounter (INDEPENDENT_AMBULATORY_CARE_PROVIDER_SITE_OTHER): Payer: Self-pay | Admitting: Family Medicine

## 2021-07-12 ENCOUNTER — Ambulatory Visit (INDEPENDENT_AMBULATORY_CARE_PROVIDER_SITE_OTHER): Payer: 59 | Admitting: Family Medicine

## 2021-07-15 ENCOUNTER — Encounter: Payer: Self-pay | Admitting: Hematology and Oncology

## 2021-07-16 ENCOUNTER — Encounter: Payer: Self-pay | Admitting: Gynecologic Oncology

## 2021-07-16 ENCOUNTER — Other Ambulatory Visit: Payer: Self-pay

## 2021-07-16 ENCOUNTER — Inpatient Hospital Stay: Payer: 59 | Attending: Gynecologic Oncology | Admitting: Gynecologic Oncology

## 2021-07-16 VITALS — BP 142/75 | HR 69 | Temp 98.3°F | Resp 18 | Ht 64.0 in | Wt 206.8 lb

## 2021-07-16 DIAGNOSIS — Z90722 Acquired absence of ovaries, bilateral: Secondary | ICD-10-CM | POA: Diagnosis not present

## 2021-07-16 DIAGNOSIS — G4733 Obstructive sleep apnea (adult) (pediatric): Secondary | ICD-10-CM | POA: Diagnosis not present

## 2021-07-16 DIAGNOSIS — Z79899 Other long term (current) drug therapy: Secondary | ICD-10-CM | POA: Diagnosis not present

## 2021-07-16 DIAGNOSIS — K219 Gastro-esophageal reflux disease without esophagitis: Secondary | ICD-10-CM | POA: Insufficient documentation

## 2021-07-16 DIAGNOSIS — I10 Essential (primary) hypertension: Secondary | ICD-10-CM | POA: Insufficient documentation

## 2021-07-16 DIAGNOSIS — Z8542 Personal history of malignant neoplasm of other parts of uterus: Secondary | ICD-10-CM | POA: Diagnosis present

## 2021-07-16 DIAGNOSIS — M069 Rheumatoid arthritis, unspecified: Secondary | ICD-10-CM | POA: Diagnosis not present

## 2021-07-16 DIAGNOSIS — Z90710 Acquired absence of both cervix and uterus: Secondary | ICD-10-CM | POA: Insufficient documentation

## 2021-07-16 DIAGNOSIS — Z9221 Personal history of antineoplastic chemotherapy: Secondary | ICD-10-CM | POA: Diagnosis not present

## 2021-07-16 DIAGNOSIS — R221 Localized swelling, mass and lump, neck: Secondary | ICD-10-CM | POA: Diagnosis not present

## 2021-07-16 DIAGNOSIS — C549 Malignant neoplasm of corpus uteri, unspecified: Secondary | ICD-10-CM

## 2021-07-16 NOTE — Patient Instructions (Signed)
Dr Denman George has ordered an ultrasound to better evaluate the mass in your neck. She suspects it is a submental lymph node that is a little enlarged. She does not think this is your cancer returned.  Please return to see Dr Alvy Bimler in November as scheduled. After your visit with Dr. Simeon Craft such, please request her staff assist in scheduling need to see either Dr. Delsa Sale or, Joylene John NP for follow-up in February as Dr. Denman George is leaving the practice.

## 2021-07-16 NOTE — Progress Notes (Signed)
GYNECOLOGIC ONCOLOGY FOLLOW-UP    Patient Name: Rachel Rivas  Patient Age: 60 y.o. 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  S/p adjuvant chemotherapy with 6 cycles of carboplatin and paclitaxel completed October, 2021.  She has no evidence of recurrence on today's exam.  She has a submental mass on exam. It is most consistent with a lymph node, though I do not expect this would be a site of metastasis from her uterine sarcoma. We will obtain a neck US to better evaluate.   I am recommending 3 monthly surveillance examinations with symptom review, physical exam (including pelvic exam).  She will follow-up with Dr Alvy Bimler in 3 months and Dr Delsa Sale or Joylene John 6 months.  History of Present Illness:  Rachel Rivas is a 60 y.o. y.o. female who is seen in consultation at the request of Dr. Dellis Filbert for an evaluation of incidentally diagnosed adenosarcoma of the uterus.  She developed PMB in early April after going through menopause at approximately age 72. She saw her PCP on 4/13 for bleeding and an ultrasound was obtained showing a thickened endometrial lining and 3-4cm mass within the endocervical canal. She was referred to [facility], started on Megace and ultimately underwent a TRH/BSO on 5/18 for abnormal uterine bleeding with resulting anemia, large prolapsing fibroid.   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   The patient saw Dr. Jeral Pinch who ordered a CT chest abdomen and pelvis on May 01, 2020 and this revealed multiple  ill-defined low-density liver masses suspicious for metastases, moderate diffuse vaginal wall thickening possibly postoperative change.  Multidisciplinary tumor board conference convened and she was recommended to undergo MRI evaluation of the liver followed by adjuvant chemotherapy.  MRI of the liver was performed on May 14, 2020 and showed that the 2 dominant right hepatic lobe lesions were most consistent with benign cavernous hemangiomas.  The remaining tiny liver lesions were too small to characterize.  Recommendation was made for follow-up in 3 months.  Between May 29, 2020 and September 11, 2020 patient received 6 cycles of adjuvant carboplatin paclitaxel chemotherapy.  She tolerated therapy well with toxicities mostly of neuropathy and fatigue.  CT abdomen and pelvis on 07/22/2020 showed some irregular soft tissue density at the vaginal cuff that was similar to the prior study.  The hepatic hemangiomas are unchanged favoring a benign etiology.  A post treatment CT scan on 10/12/2020 showed stable appearance of the vaginal cuff, status post hysterectomy likely postsurgical change.  Stable hepatic hemangiomas and no evidence of metastatic disease.  Interval Hx:  Subsequent surveillance imaging on 04/12/2021 showed unchanged appearance of somewhat nodular soft tissue in the left and right aspects of the vaginal cuff favored to represent postoperative change without evidence of progression to suggest malignant disease.  The patient returned for routine surveillance with no symptoms concerning for recurrent disease. She reported a painless lump in the submental region.   PAST MEDICAL HISTORY:  Past Medical History:  Diagnosis Date   Allergy    occasionally   Anemia    yrs ago, low platelets   Arthritis  RA   Back pain    Blood transfusion without reported diagnosis    1985 with c section   Borderline diabetic    Cancer (HCC)    Dyspnea    While taking Megace   GERD (gastroesophageal  reflux disease)    Hypertension    Joint pain    Obstructive sleep apnea syndrome 01/07/2020   Prediabetes    Rheumatoid arthritis (HCC)    Sciatica    SOB (shortness of breath)    Thrombocytopenia ([place])      PAST SURGICAL HISTORY:  Past Surgical History:  Procedure Laterality Date   CESAREAN SECTION     COLONOSCOPY     IR IMAGING GUIDED PORT INSERTION  05/22/2020   IR REMOVAL TUN ACCESS W/ PORT W/O FL MOD SED  04/30/2021   MYOMECTOMY N/A 04/07/2020   Procedure: VAGINAL MYOMECTOMY;  Surgeon: Princess Bruins, MD;  Location: [place];  Service: Gynecology;  Laterality: N/A;   POLYPECTOMY     ROBOTIC ASSISTED TOTAL HYSTERECTOMY WITH BILATERAL SALPINGO OOPHERECTOMY Bilateral 04/07/2020   Procedure: XI ROBOTIC TOTAL LAPAROSCOPIC HYSTERECTOMY, BILATERAL SALPINGO OOPHORECTOMY;  Surgeon: Princess Bruins, MD;  Location: [place];  Service: Gynecology;  Laterality: Bilateral;   STERILIZATION     TUBAL LIGATION      OB/GYN HISTORY:  OB History  Gravida Para Term Preterm AB Living  '3 2     1 2  '$ SAB IAB Ectopic Multiple Live Births    1          # Outcome Date GA Lbr Len/2nd Weight Sex Delivery Anes PTL Lv  3 IAB           2 Para           1 Para             Patient's last menstrual period was 11/17/2012.  Age at menarche: 47  Age at menopause: 35 Hx of HRT: denies Hx of STDs: no Last pap: 11/2016 History of abnormal pap smears: no  SCREENING STUDIES:  Last mammogram: 01/2020  Last colonoscopy: 2017 Last bone mineral density: 2016  MEDICATIONS: Outpatient Encounter Medications as of 07/16/2021  Medication Sig   albuterol (VENTOLIN HFA) 108 (90 Base) MCG/ACT inhaler Inhale 2 puffs into the lungs every 6 (six) hours as needed for wheezing or shortness of breath.   cyclobenzaprine (FLEXERIL) 10 MG tablet Take 1 tablet (10 mg total) by mouth at bedtime as needed for muscle spasms.   famotidine (PEPCID) 20 MG tablet TAKE 1 TABLET BY MOUTH  TWICE DAILY FOR HEART   folic acid (FOLVITE) 1 MG tablet Take 2 mg by mouth daily.   ibuprofen (ADVIL) 800 MG tablet Take 1 tablet (800 mg total) by mouth 3 (three) times daily.   losartan-hydrochlorothiazide (HYZAAR) 50-12.5 MG tablet Take 1 tablet by mouth daily.   methotrexate (RHEUMATREX) 10 MG tablet Take 20 mg by mouth once a week. Caution: Chemotherapy. Protect from light.   Multiple Vitamin (MULTIVITAMIN ADULT PO) Take by mouth.   triamcinolone (KENALOG) 0.1 % Apply 1 application topically 2 (two) times daily.   VITAMIN D PO Take by mouth.   fluticasone (FLOVENT HFA) 110 MCG/ACT inhaler Inhale 1 puff into the lungs in the morning and at bedtime. (Patient not taking: Reported on 07/16/2021)   rosuvastatin (CRESTOR) 5 MG tablet Take 1 tablet (5 mg total) by mouth daily.   No facility-administered encounter medications on file as of 07/16/2021.    ALLERGIES:  Allergies  Allergen Reactions   Latex Itching, Dermatitis and Rash   Sulfa Antibiotics Nausea And Vomiting   Elemental Sulfur Nausea Only     FAMILY HISTORY:  Family History  Problem Relation Age of Onset   Lung cancer Mother    Hypertension Mother    Diabetes Mother    Uterine cancer Mother    Cancer Mother        endometrial   Obesity Mother    Hypertension Father    Diabetes Father    Stroke Father    Hyperlipidemia Father    Obesity Father    Colon cancer Neg Hx    Colon polyps Neg Hx    Esophageal cancer Neg Hx    Rectal cancer Neg Hx    Stomach cancer Neg Hx      SOCIAL HISTORY:  Social Connections: Not on file    REVIEW OF SYSTEMS:  +Anxiety Denies appetite changes, fevers, chills, fatigue, unexplained weight changes. Denies hearing loss, neck lumps or masses, mouth sores, ringing in ears or voice changes. Denies cough or wheezing.  Denies shortness of breath. Denies chest pain or palpitations. Denies leg swelling. Denies abdominal distention, blood in stools, constipation, diarrhea, nausea,  vomiting, or early satiety. Denies pain with intercourse, dysuria, frequency, hematuria or incontinence. Denies hot flashes, pelvic pain, vaginal bleeding or vaginal discharge.   Denies joint pain, back pain or muscle pain/cramps. Denies itching, rash, or wounds. Denies dizziness, headaches, numbness or seizures. Denies swollen lymph nodes or glands, denies easy bruising or bleeding. Denies depression, confusion, or decreased concentration.  Physical Exam:  Vital Signs for this encounter:  Blood pressure (!) 142/75, pulse 69, temperature 98.3 F (36.8 C), temperature source Oral, resp. rate 18, height '5\' 4"'$  (1.626 m), weight 206 lb 12.8 oz (93.8 kg), last menstrual period 11/17/2012, SpO2 100 %. Body mass index is 35.5 kg/m. General: Alert, oriented, no acute distress.  HEENT: + 2cm submental nodule (painless, no erythema) Chest: no increased WOB Cardiovascular: well perfused peripheries Abdomen: Obese. Normoactive bowel sounds. Soft, nondistended, nontender to palpation. No masses or hepatosplenomegaly appreciated. No palpable fluid wave.  Incisions soft.  Extremities: Grossly normal range of motion. Warm, well perfused. No edema bilaterally.  Skin: No rashes or lesions.  Lymphatics: No cervical, supraclavicular, or inguinal adenopathy.  GU:  Normal external female genitalia. No lesions. No discharge or bleeding.             Bladder/urethra:  No lesions or masses, well supported bladder             Vagina: smooth, no palpable mass at cuff or beyond.             Cervix/uterus: surgically absent.  Rectal: no nodularity.  Thereasa Solo, MD

## 2021-07-21 ENCOUNTER — Ambulatory Visit (HOSPITAL_COMMUNITY): Payer: 59

## 2021-07-30 ENCOUNTER — Ambulatory Visit (HOSPITAL_COMMUNITY)
Admission: RE | Admit: 2021-07-30 | Discharge: 2021-07-30 | Disposition: A | Discharge status: Home / Self Care | Payer: 59 | Source: Ambulatory Visit | Attending: Gynecologic Oncology | Admitting: Gynecologic Oncology

## 2021-07-30 ENCOUNTER — Other Ambulatory Visit: Payer: Self-pay

## 2021-07-30 DIAGNOSIS — R221 Localized swelling, mass and lump, neck: Secondary | ICD-10-CM | POA: Diagnosis present

## 2021-08-03 DIAGNOSIS — J4521 Mild intermittent asthma with (acute) exacerbation: Secondary | ICD-10-CM | POA: Insufficient documentation

## 2021-08-03 NOTE — Assessment & Plan Note (Signed)
Triple vaccinated.  Negative COVID testing. No known exposure.  Bacteria vs allergic vs viral cause of asthma exacerbation. Treat with prednisone taper, start antibiotic course but repeat COVID testing if not improving as expected.  Pt instructed to call  With positive test as she would be a good candidate for  COVID antivirals.

## 2021-08-04 ENCOUNTER — Telehealth: Payer: Self-pay

## 2021-08-04 DIAGNOSIS — R221 Localized swelling, mass and lump, neck: Secondary | ICD-10-CM

## 2021-08-04 NOTE — Telephone Encounter (Signed)
Reviewed ultrasound results from 07/30/21 with Rachel Rivas.  Instructed that she needs to follow up with ENT, referral has been put in. Patient verbalized understanding and will call with questions or concerns.

## 2021-08-05 ENCOUNTER — Ambulatory Visit: Payer: 59

## 2021-08-10 ENCOUNTER — Telehealth: Payer: Self-pay | Admitting: *Deleted

## 2021-08-10 NOTE — Telephone Encounter (Signed)
Received a message from [facility], they don't accept [employer] health insurance. Patient referred to Dr Benjamine Mola office

## 2021-08-12 ENCOUNTER — Telehealth: Payer: Self-pay

## 2021-08-12 NOTE — Telephone Encounter (Signed)
Told Ms Rothery that Dr. Lucia Gaskins is not taking patients as he will be retiring in a few months.  [facility] does not accept her insurance. A referral was sent to Dr. Deeann Saint office in [place]. Office number is [phone]. She can call to set up appointment .  The office does not have an opening until November.  She could also schedule an appointment and then see if they have a cancellation list. Joylene John, NP stated that the information from the scans was not specific for a cause of enlarged lymph node.  Appointment is fine for November. Pt verbalized understanding.

## 2021-08-26 ENCOUNTER — Telehealth: Payer: Self-pay | Admitting: *Deleted

## 2021-08-26 NOTE — Telephone Encounter (Signed)
Called and spoke with the patient regarding her referral to Dr Benjamine Mola ENT; patient stated "I saw him last week and I follow up in two months. He said it was very unlikely for the cancer to move from my uterus to my throat. That the lump is probably virus or bacteria infection and once it goes away the lump will go down. The lump is moveable and that is good."

## 2021-08-31 ENCOUNTER — Other Ambulatory Visit: Payer: Self-pay

## 2021-08-31 MED ORDER — LOSARTAN POTASSIUM-HCTZ 50-12.5 MG PO TABS: 1.0000 | ORAL_TABLET | Freq: Every day | ORAL | 3 refills | Status: DC

## 2021-10-07 ENCOUNTER — Ambulatory Visit: Payer: 59 | Admitting: Hematology and Oncology

## 2021-10-07 ENCOUNTER — Other Ambulatory Visit: Payer: 59

## 2021-10-08 ENCOUNTER — Ambulatory Visit (INDEPENDENT_AMBULATORY_CARE_PROVIDER_SITE_OTHER): Payer: 59 | Admitting: Pulmonary Disease

## 2021-10-08 ENCOUNTER — Encounter: Payer: Self-pay | Admitting: Pulmonary Disease

## 2021-10-08 ENCOUNTER — Other Ambulatory Visit: Payer: Self-pay

## 2021-10-08 DIAGNOSIS — J4521 Mild intermittent asthma with (acute) exacerbation: Secondary | ICD-10-CM

## 2021-10-08 DIAGNOSIS — G4733 Obstructive sleep apnea (adult) (pediatric): Secondary | ICD-10-CM | POA: Diagnosis not present

## 2021-10-08 NOTE — Assessment & Plan Note (Signed)
Provided her alternatives to Flovent would include Pulmicort, Qvar but all of these would be expensive for her up to $200.  She can continue to use albuterol on an as-needed basis

## 2021-10-08 NOTE — Assessment & Plan Note (Signed)
CPAP download was reviewed and this showed excellent control of events on auto settings 5 to 15 cm with average pressure of 14 cm and maximum pressure of 15 cm.  Leak is minimal.  She is very compliant more than 7 hours every night and CPAP is only helped improve her daytime somnolence and fatigue  Weight loss encouraged, compliance with goal of at least 4-6 hrs every night is the expectation. Advised against medications with sedative side effects Cautioned against driving when sleepy - understanding that sleepiness will vary on a day to day basis

## 2021-10-08 NOTE — Progress Notes (Signed)
   Subjective:    Patient ID: Rachel Rivas, female    DOB: 03-30-1961, 60 y.o.   MRN: [id number]  HPI  60 yo for FU of OSA  PMH -hypertension, rheumatoid arthritis on methotrexate , uterine adenosarcoma status post TAH and chemotherapy   She was set up with CPAP in May 2021 after sleep study confirmed moderate OSA.  She has done well with this and is adjusted to a nasal mask.  This is improved her daytime somnolence and fatigue, she has more energy in the daytime. Unfortunately she was diagnosed with uterine cancer/adenosarcoma and underwent chemotherapy, Port-A-Cath has now been removed.  Weight is unchanged  She developed episodes of bronchitis and was given a prednisone course and then prescribed Flovent this is very expensive for her and she requests alternative.  She is a never smoker  Significant tests/ events reviewed   HST 03/2020  mod OSA with AHI 24/ hr  HST 01/2015 - wt 197 pounds -AHI 20/hour  Review of Systems neg for any significant sore throat, dysphagia, itching, sneezing, nasal congestion or excess/ purulent secretions, fever, chills, sweats, unintended wt loss, pleuritic or exertional cp, hempoptysis, orthopnea pnd or change in chronic leg swelling. Also denies presyncope, palpitations, heartburn, abdominal pain, nausea, vomiting, diarrhea or change in bowel or urinary habits, dysuria,hematuria, rash, arthralgias, visual complaints, headache, numbness weakness or ataxia.     Objective:   Physical Exam  Gen. Pleasant, obese, in no distress ENT - no lesions, no post nasal drip Neck: No JVD, no thyromegaly, no carotid bruits Lungs: no use of accessory muscles, no dullness to percussion, decreased without rales or rhonchi  Cardiovascular: Rhythm regular, heart sounds  normal, no murmurs or gallops, no peripheral edema Musculoskeletal: No deformities, no cyanosis or clubbing , no tremors       Assessment & Plan:

## 2021-10-08 NOTE — Patient Instructions (Addendum)
CPAP is working well on current settings Alternatives to Flovent include  Qvar or budesonide

## 2021-10-15 ENCOUNTER — Inpatient Hospital Stay: Payer: 59 | Attending: Hematology and Oncology

## 2021-10-15 ENCOUNTER — Inpatient Hospital Stay (HOSPITAL_BASED_OUTPATIENT_CLINIC_OR_DEPARTMENT_OTHER): Payer: 59 | Admitting: Hematology and Oncology

## 2021-10-15 ENCOUNTER — Encounter: Payer: Self-pay | Admitting: Hematology and Oncology

## 2021-10-15 ENCOUNTER — Other Ambulatory Visit: Payer: Self-pay

## 2021-10-15 DIAGNOSIS — Z79899 Other long term (current) drug therapy: Secondary | ICD-10-CM | POA: Diagnosis not present

## 2021-10-15 DIAGNOSIS — R590 Localized enlarged lymph nodes: Secondary | ICD-10-CM | POA: Diagnosis not present

## 2021-10-15 DIAGNOSIS — D693 Immune thrombocytopenic purpura: Secondary | ICD-10-CM | POA: Diagnosis not present

## 2021-10-15 DIAGNOSIS — Z9221 Personal history of antineoplastic chemotherapy: Secondary | ICD-10-CM | POA: Diagnosis not present

## 2021-10-15 DIAGNOSIS — C549 Malignant neoplasm of corpus uteri, unspecified: Secondary | ICD-10-CM

## 2021-10-15 DIAGNOSIS — Z90722 Acquired absence of ovaries, bilateral: Secondary | ICD-10-CM | POA: Diagnosis not present

## 2021-10-15 DIAGNOSIS — Z8542 Personal history of malignant neoplasm of other parts of uterus: Secondary | ICD-10-CM | POA: Diagnosis not present

## 2021-10-15 DIAGNOSIS — Z90710 Acquired absence of both cervix and uterus: Secondary | ICD-10-CM | POA: Diagnosis not present

## 2021-10-15 DIAGNOSIS — Z9079 Acquired absence of other genital organ(s): Secondary | ICD-10-CM | POA: Diagnosis not present

## 2021-10-15 DIAGNOSIS — D696 Thrombocytopenia, unspecified: Secondary | ICD-10-CM

## 2021-10-15 LAB — CBC WITH DIFFERENTIAL/PLATELET
Abs Immature Granulocytes: 0.01 10*3/uL (ref 0.00–0.07)
Basophils Absolute: 0 10*3/uL (ref 0.0–0.1)
Basophils Relative: 0 %
Eosinophils Absolute: 0.2 10*3/uL (ref 0.0–0.5)
Eosinophils Relative: 2 %
HCT: 34.7 % — ABNORMAL LOW (ref 36.0–46.0)
Hemoglobin: 10.9 g/dL — ABNORMAL LOW (ref 12.0–15.0)
Immature Granulocytes: 0 %
Lymphocytes Relative: 35 %
Lymphs Abs: 2.8 10*3/uL (ref 0.7–4.0)
MCH: 26.5 pg (ref 26.0–34.0)
MCHC: 31.4 g/dL (ref 30.0–36.0)
MCV: 84.4 fL (ref 80.0–100.0)
Monocytes Absolute: 0.7 10*3/uL (ref 0.1–1.0)
Monocytes Relative: 8 %
Neutro Abs: 4.4 10*3/uL (ref 1.7–7.7)
Neutrophils Relative %: 55 %
Platelets: 63 10*3/uL — ABNORMAL LOW (ref 150–400)
RBC: 4.11 MIL/uL (ref 3.87–5.11)
RDW: 15 % (ref 11.5–15.5)
WBC: 8 10*3/uL (ref 4.0–10.5)
nRBC: 0 % (ref 0.0–0.2)

## 2021-10-15 LAB — COMPREHENSIVE METABOLIC PANEL
ALT: 15 U/L (ref 0–44)
AST: 13 U/L — ABNORMAL LOW (ref 15–41)
Albumin: 4.1 g/dL (ref 3.5–5.0)
Alkaline Phosphatase: 74 U/L (ref 38–126)
Anion gap: 10 (ref 5–15)
BUN: 21 mg/dL (ref 8–23)
CO2: 27 mmol/L (ref 22–32)
Calcium: 9.7 mg/dL (ref 8.9–10.3)
Chloride: 104 mmol/L (ref 98–111)
Creatinine, Ser: 0.86 mg/dL (ref 0.44–1.00)
GFR, Estimated: >60 mL/min (ref >60)
Glucose, Bld: 122 mg/dL — ABNORMAL HIGH (ref 70–99)
Potassium: 4.2 mmol/L (ref 3.5–5.1)
Sodium: 141 mmol/L (ref 135–145)
Total Bilirubin: 0.5 mg/dL (ref 0.3–1.2)
Total Protein: 7.7 g/dL (ref 6.5–8.1)

## 2021-10-15 NOTE — Assessment & Plan Note (Signed)
Clinically, she has no signs of residual disease The submental lymph node is unrelated I recommend follow-up with gynecologist in 3 months and I plan to see her in 6 months She is educated to watch out for signs and symptoms of cancer recurrence

## 2021-10-15 NOTE — Progress Notes (Signed)
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[facility]) Clinically, she has no signs of residual disease The submental lymph node is unrelated I recommend follow-up with gynecologist in 3 months and I plan to see her in 6 months She is educated to watch out for signs and symptoms of cancer recurrence  Thrombocytopenia ([place]) Her low platelet count is likely due to her chronic ITP She is not symptomatic Observe for now  Lymphadenopathy, cervical She has palpable lymphadenopathy that is persistent in the submental region Previous ultrasound review 1.1 cm lymph node likely reactive Examination today is completely benign I recommend dental evaluation to rule out oropharyngeal causes of reactive lymphadenopathy I will reassess in her next visit  No orders of the defined types were placed in this encounter.   All questions were answered. The patient knows to call the clinic with any problems, questions or concerns. The total time spent in the appointment was 20 minutes encounter with patients including review of chart and various tests results, discussions about plan of care and coordination of care plan   Heath Lark, MD 10/15/2021 4:19 PM  INTERVAL HISTORY: Please see below for problem oriented charting. she returns for treatment follow-up on history of adjuvant treatment for uterine sarcoma and chronic thrombocytopenia secondary to ITP She denies changes to the palpable lymphadenopathy in the submental region No abdominal pain, changes in bowel habits or bloating No recent bleeding  REVIEW OF SYSTEMS:   Constitutional: Denies fevers, chills or abnormal weight loss Eyes: Denies blurriness of vision Ears, nose, mouth, throat, and face: Denies  mucositis or sore throat Respiratory: Denies cough, dyspnea or wheezes Cardiovascular: Denies palpitation, chest discomfort or lower extremity swelling Gastrointestinal:  Denies nausea, heartburn or change in bowel habits Skin: Denies abnormal skin rashes Lymphatics: Denies new lymphadenopathy or easy bruising Neurological:Denies numbness, tingling or new weaknesses Behavioral/Psych: Mood is stable, no new changes  All other systems were reviewed with the patient and are negative.  I have reviewed the past medical history, past surgical history, social history and family history with the patient and they are unchanged from previous note.  ALLERGIES:  is allergic to latex, sulfa antibiotics, and elemental sulfur.  MEDICATIONS:  Current Outpatient Medications  Medication Sig Dispense Refill   albuterol (VENTOLIN HFA) 108 (90 Base) MCG/ACT inhaler Inhale 2 puffs into the lungs every 6 (six) hours as needed for wheezing or shortness of breath. 8 g 2   cyclobenzaprine (FLEXERIL) 10 MG tablet Take 1 tablet (10 mg total) by mouth at bedtime as needed for muscle spasms. 15 tablet 0   famotidine (PEPCID) 20 MG tablet TAKE 1 TABLET BY MOUTH TWICE DAILY FOR HEART 675 tablet 0   folic acid (FOLVITE) 1 MG tablet Take 2 mg by mouth daily.     ibuprofen (ADVIL) 800 MG tablet Take 1 tablet (800 mg total) by mouth 3 (three) times daily. 30 tablet 2   losartan-hydrochlorothiazide (HYZAAR) 50-12.5 MG tablet Take 1 tablet by mouth daily. 90 tablet 3   Multiple Vitamin (MULTIVITAMIN ADULT PO) Take by mouth.     rosuvastatin (CRESTOR) 5 MG tablet Take 1 tablet (5 mg total) by mouth daily. 90 tablet 3   triamcinolone (KENALOG) 0.1 % Apply 1 application topically 2 (two) times daily. [place]  g 0   VITAMIN D PO Take by mouth.     No current facility-administered medications for this visit.    SUMMARY OF ONCOLOGIC HISTORY: Oncology History Overview Note  MSI stable   Adenosarcoma of body of uterus ([place])   03/03/2020 Initial Diagnosis   She developed PMB in early April after going through menopause at approximately age 6. She saw her PCP on 4/13 for bleeding and an ultrasound was obtained showing a thickened endometrial lining and 3-4cm mass within the endocervical canal. She was referred to [facility], started on Megace and ultimately underwent a TRH/BSO on 5/18 for abnormal uterine bleeding with resulting anemia, large prolapsing fibroid.    03/06/2020 Imaging   US pelvis Markedly thickened heterogeneous, and hypervascular endometrial complex up to 38 mm thick highly concerning for endometrial neoplasm.   Additional more focal 3.0 x 2.8 x 5.3 cm diameter mass within endocervical canal, may represent extension of above endometrial pathology versus separate endocervical tumor.   Tissue diagnosis recommended.   Nonvisualization of ovaries.   04/07/2020 Pathology Results   A. UTERUS, CERVIX AND BILATERAL FALLOPIAN TUBES AND OVARIES, HYSTERECTOMY AND BILATERAL SALPINGO-OOPHORECTOMY: - Mullerian adenosarcoma of endometrium. - Tumor limited to the uterus. - Tumor invades to less than half of the myometrium. - No involvement of adnexa. - Margins of resection are not involved. - See oncology table and comment. ONCOLOGY TABLE: UTERUS, SARCOMA: Procedure: Total hysterectomy and bilateral salpingo-oophorectomy Specimen Integrity: The uterus, cervix and bilateral fallopian tubes and ovaries were received intact. The nodule clinically identified as vaginally prolapsed uterine fibroid was received separate and disrupted. Tumor Size: Greatest dimension: 7.5 cm Histologic Type: Mullerian adenosarcoma with overgrowth of the epithelial and stromal components Histologic Grade: The epithelial component has proliferated to form endometrioid adenocarcinoma which ranges from low grade to high grade. The stromal component focally shows sarcomatous overgrowth. Myometrial Invasion: Present Depth of invasion: 4  mm Myometrial thickness: 30 mm Other Tissue/Organ Involvement: Not identified Margins: Uninvolved by sarcoma Lymphovascular Invasion: Not identified Regional Lymph Nodes: No lymph nodes submitted or found Pathologic Stage Classification (pTNM, AJCC 8th Edition): pT1b, pNX Additional Pathologic Findings: Adenomyosis. Leiomyomata.   04/07/2020 Surgery   PRE-OPERATIVE DIAGNOSIS:  Large prolapsed fibroid vaginally, menometrorrhagia with anemia   POST-OPERATIVE DIAGNOSIS:  Large prolapsed fibroid vaginally, menometrorrhagia with anemia.  Omental adhesions with anterior abdominal wall.   PROCEDURE:  Procedure(s): XI ROBOTIC TOTAL LAPAROSCOPIC HYSTERECTOMY, BILATERAL SALPINGO OOPHORECTOMY/LYSIS OF ADHESIONS/VAGINAL MYOMECTOMY    FINDINGS: Large necrotic vaginally prolapsed uterine myoma.  Uterus with small subserosal/intramural myoma.  Bilateral tubes post tubal ligation.  Bilateral normal ovaries.  Omental adhesions with anterior abdominal wall.   05/01/2020 Imaging   Ct scan of chest, abdomen and pelvis 1. Multiple ill-defined low density liver masses, suspicious for metastases. These could be further characterized with pre and postcontrast magnetic resonance imaging of the liver. 2. Moderate diffuse vaginal wall thickening, possibly representing edema associated with the patient's recent surgery. 3. Small hiatal hernia. 4. Minimal coronary artery atheromatous calcifications. 5. Cholelithiasis. 6. Tiny rounded area of low density in the posterior spleen, too small to characterize.   05/11/2020 Cancer Staging   Staging form: Corpus Uteri - Adenosarcoma, AJCC 8th Edition - Pathologic: Stage IB (pT1b, pN0, cM0) - Signed by Heath Lark, MD on 05/15/2020    05/14/2020 Imaging   MRI liver 1. The 2 dominant right hepatic lobe lesions have MR imaging features most consistent with benign cavernous hemangioma. The remaining tiny liver lesions are too small to characterize. Statistically,  while these  are likely benign, follow-up MRI in 3 months recommended to ensure stability. 2. No other findings to suggest metastatic disease.   05/22/2020 Procedure   Placement of a subcutaneous port device. Catheter tip at the superior cavoatrial junction   05/29/2020 - 09/11/2020 Chemotherapy   The patient had carboplatin and taxol for chemotherapy treatment.     07/22/2020 Imaging   1. Status post hysterectomy and bilateral oophorectomy. Irregular soft tissue density in the region of the vaginal cuff is relatively similar to on the prior exam. Although this could represent developing scar or chronic hematoma, residual disease cannot be excluded. This could either be re-evaluated at follow-up or if a more aggressive approach is desired, characterized with PET. 2. No other evidence of metastatic disease in the abdomen or pelvis. 3. Hepatic hemangiomas, as before. Smaller liver lesions are unchanged, favoring a benign etiology. 4. Cholelithiasis. 5. Tiny hiatal hernia. 6. Aortic Atherosclerosis (ICD10-I70.0).   10/12/2020 Imaging   1. Stable appearance of the vaginal cuff status post hysterectomy. Soft tissue fullness appears unchanged, likely postsurgical in etiology. Correlate with physical examination and tumor markers. 2. No evidence of metastatic disease. 3. Stable hepatic hemangiomas. 4. Cholelithiasis without evidence of cholecystitis or biliary dilatation. 5. Aortic Atherosclerosis (ICD10-I70.0).   04/12/2021 Imaging   1. Status post hysterectomy. Unchanged appearance of somewhat nodular soft tissue at the left and right aspects of the vaginal cuff, again favored to represent postoperative change without specific findings or evidence of progression to suggest local malignant recurrence. 2. No noncontrast evidence of metastatic disease in the abdomen or pelvis. 3. Cholelithiasis.   Aortic Atherosclerosis (ICD10-I70.0).   04/30/2021 Procedure   Removal of implanted Port-A-Cath utilizing sharp and  blunt dissection. The procedure was uncomplicated.     PHYSICAL EXAMINATION: ECOG PERFORMANCE STATUS: 1 - Symptomatic but completely ambulatory  Vitals:   10/15/21 0802  BP: 129/69  Pulse: 75  Resp: 16  Temp: (!) 97 F (36.1 C)  SpO2: 100%   Filed Weights   10/15/21 0802  Weight: 210 lb (95.3 kg)    GENERAL:alert, no distress and comfortable SKIN: skin color, texture, turgor are normal, no rashes or significant lesions EYES: normal, Conjunctiva are pink and non-injected, sclera clear OROPHARYNX:no exudate, no erythema and lips, buccal mucosa, and tongue normal  NECK: supple, thyroid normal size, non-tender, without nodularity LYMPH: She has palpable lymphadenopathy in the submental region LUNGS: clear to auscultation and percussion with normal breathing effort HEART: regular rate & rhythm and no murmurs and no lower extremity edema ABDOMEN:abdomen soft, non-tender and normal bowel sounds Musculoskeletal:no cyanosis of digits and no clubbing  NEURO: alert & oriented x 3 with fluent speech, no focal motor/sensory deficits  LABORATORY DATA:  I have reviewed the data as listed    Component Value Date/Time   NA 141 10/15/2021 0744   NA 142 01/28/2021 0922   NA 142 04/07/2017 0810   K 4.2 10/15/2021 0744   K 3.8 04/07/2017 0810   CL 104 10/15/2021 0744   CO2 27 10/15/2021 0744   CO2 28 04/07/2017 0810   GLUCOSE 122 (H) 10/15/2021 0744   GLUCOSE 100 04/07/2017 0810   BUN 21 10/15/2021 0744   BUN 17 01/28/2021 0922   BUN 16.8 04/07/2017 0810   CREATININE 0.86 10/15/2021 0744   CREATININE 0.78 04/12/2021 1301   CREATININE 0.9 04/07/2017 0810   CALCIUM 9.7 10/15/2021 0744   CALCIUM 9.9 04/07/2017 0810   PROT 7.7 10/15/2021 0744   PROT 7.6 01/28/2021  0922   PROT 8.0 04/07/2017 0810   ALBUMIN 4.1 10/15/2021 0744   ALBUMIN 4.8 01/28/2021 0922   ALBUMIN 4.4 04/07/2017 0810   AST 13 (L) 10/15/2021 0744   AST 15 04/12/2021 1301   AST 18 04/07/2017 0810   ALT 15  10/15/2021 0744   ALT 16 04/12/2021 1301   ALT 23 04/07/2017 0810   ALKPHOS 74 10/15/2021 0744   ALKPHOS 79 04/07/2017 0810   BILITOT 0.5 10/15/2021 0744   BILITOT 0.5 04/12/2021 1301   BILITOT 0.70 04/07/2017 0810   GFRNONAA >60 10/15/2021 0744   GFRNONAA >60 04/12/2021 1301   GFRAA >60 08/21/2020 0805    No results found for: SPEP, UPEP  Lab Results  Component Value Date   WBC 8.0 10/15/2021   NEUTROABS 4.4 10/15/2021   HGB 10.9 (L) 10/15/2021   HCT 34.7 (L) 10/15/2021   MCV 84.4 10/15/2021   PLT 63 (L) 10/15/2021      Chemistry      Component Value Date/Time   NA 141 10/15/2021 0744   NA 142 01/28/2021 0922   NA 142 04/07/2017 0810   K 4.2 10/15/2021 0744   K 3.8 04/07/2017 0810   CL 104 10/15/2021 0744   CO2 27 10/15/2021 0744   CO2 28 04/07/2017 0810   BUN 21 10/15/2021 0744   BUN 17 01/28/2021 0922   BUN 16.8 04/07/2017 0810   CREATININE 0.86 10/15/2021 0744   CREATININE 0.78 04/12/2021 1301   CREATININE 0.9 04/07/2017 0810      Component Value Date/Time   CALCIUM 9.7 10/15/2021 0744   CALCIUM 9.9 04/07/2017 0810   ALKPHOS 74 10/15/2021 0744   ALKPHOS 79 04/07/2017 0810   AST 13 (L) 10/15/2021 0744   AST 15 04/12/2021 1301   AST 18 04/07/2017 0810   ALT 15 10/15/2021 0744   ALT 16 04/12/2021 1301   ALT 23 04/07/2017 0810   BILITOT 0.5 10/15/2021 0744   BILITOT 0.5 04/12/2021 1301   BILITOT 0.70 04/07/2017 0810

## 2021-10-15 NOTE — Assessment & Plan Note (Signed)
She has palpable lymphadenopathy that is persistent in the submental region Previous ultrasound review 1.1 cm lymph node likely reactive Examination today is completely benign I recommend dental evaluation to rule out oropharyngeal causes of reactive lymphadenopathy I will reassess in her next visit

## 2021-10-15 NOTE — Assessment & Plan Note (Signed)
Her low platelet count is likely due to her chronic ITP She is not symptomatic Observe for now

## 2021-11-18 ENCOUNTER — Encounter: Payer: Self-pay | Admitting: Hematology and Oncology

## 2021-11-24 ENCOUNTER — Telehealth: Payer: Self-pay

## 2021-11-24 ENCOUNTER — Other Ambulatory Visit: Payer: Self-pay | Admitting: Hematology and Oncology

## 2021-11-24 DIAGNOSIS — D696 Thrombocytopenia, unspecified: Secondary | ICD-10-CM

## 2021-11-24 NOTE — Telephone Encounter (Signed)
Appt added and she is aware of appt time/date.

## 2021-11-24 NOTE — Telephone Encounter (Signed)
I can see her tomorrow morning at 820 am with labs

## 2021-11-24 NOTE — Telephone Encounter (Signed)
She called and left a message. She had a nose bleed today and 2 weeks ago. She has been feeling tired. She thinks her platelets may be low and she needs appt.

## 2021-11-25 ENCOUNTER — Inpatient Hospital Stay: Payer: BC Managed Care – PPO | Attending: Hematology and Oncology

## 2021-11-25 ENCOUNTER — Inpatient Hospital Stay (HOSPITAL_BASED_OUTPATIENT_CLINIC_OR_DEPARTMENT_OTHER): Payer: BC Managed Care – PPO | Admitting: Hematology and Oncology

## 2021-11-25 ENCOUNTER — Other Ambulatory Visit: Payer: Self-pay

## 2021-11-25 ENCOUNTER — Encounter: Payer: Self-pay | Admitting: Hematology and Oncology

## 2021-11-25 ENCOUNTER — Telehealth: Payer: Self-pay | Admitting: Oncology

## 2021-11-25 DIAGNOSIS — C549 Malignant neoplasm of corpus uteri, unspecified: Secondary | ICD-10-CM | POA: Diagnosis not present

## 2021-11-25 DIAGNOSIS — Z79899 Other long term (current) drug therapy: Secondary | ICD-10-CM | POA: Insufficient documentation

## 2021-11-25 DIAGNOSIS — Z9221 Personal history of antineoplastic chemotherapy: Secondary | ICD-10-CM | POA: Insufficient documentation

## 2021-11-25 DIAGNOSIS — D693 Immune thrombocytopenic purpura: Secondary | ICD-10-CM | POA: Insufficient documentation

## 2021-11-25 DIAGNOSIS — Z9079 Acquired absence of other genital organ(s): Secondary | ICD-10-CM | POA: Insufficient documentation

## 2021-11-25 DIAGNOSIS — Z90710 Acquired absence of both cervix and uterus: Secondary | ICD-10-CM | POA: Insufficient documentation

## 2021-11-25 DIAGNOSIS — Z90722 Acquired absence of ovaries, bilateral: Secondary | ICD-10-CM | POA: Diagnosis not present

## 2021-11-25 DIAGNOSIS — D696 Thrombocytopenia, unspecified: Secondary | ICD-10-CM

## 2021-11-25 DIAGNOSIS — Z8542 Personal history of malignant neoplasm of other parts of uterus: Secondary | ICD-10-CM | POA: Diagnosis not present

## 2021-11-25 LAB — CBC WITH DIFFERENTIAL/PLATELET
Abs Immature Granulocytes: 0.02 10*3/uL (ref 0.00–0.07)
Basophils Absolute: 0 10*3/uL (ref 0.0–0.1)
Basophils Relative: 0 %
Eosinophils Absolute: 0.1 10*3/uL (ref 0.0–0.5)
Eosinophils Relative: 2 %
HCT: 35.4 % — ABNORMAL LOW (ref 36.0–46.0)
Hemoglobin: 11.2 g/dL — ABNORMAL LOW (ref 12.0–15.0)
Immature Granulocytes: 0 %
Lymphocytes Relative: 40 %
Lymphs Abs: 2.6 10*3/uL (ref 0.7–4.0)
MCH: 26.2 pg (ref 26.0–34.0)
MCHC: 31.6 g/dL (ref 30.0–36.0)
MCV: 82.9 fL (ref 80.0–100.0)
Monocytes Absolute: 0.5 10*3/uL (ref 0.1–1.0)
Monocytes Relative: 7 %
Neutro Abs: 3.2 10*3/uL (ref 1.7–7.7)
Neutrophils Relative %: 51 %
Platelets: 66 10*3/uL — ABNORMAL LOW (ref 150–400)
RBC: 4.27 MIL/uL (ref 3.87–5.11)
RDW: 15.1 % (ref 11.5–15.5)
WBC: 6.4 10*3/uL (ref 4.0–10.5)
nRBC: 0 % (ref 0.0–0.2)

## 2021-11-25 LAB — COMPREHENSIVE METABOLIC PANEL
ALT: 15 U/L (ref 0–44)
AST: 14 U/L — ABNORMAL LOW (ref 15–41)
Albumin: 4.2 g/dL (ref 3.5–5.0)
Alkaline Phosphatase: 68 U/L (ref 38–126)
Anion gap: 6 (ref 5–15)
BUN: 19 mg/dL (ref 8–23)
CO2: 30 mmol/L (ref 22–32)
Calcium: 9.2 mg/dL (ref 8.9–10.3)
Chloride: 104 mmol/L (ref 98–111)
Creatinine, Ser: 0.88 mg/dL (ref 0.44–1.00)
GFR, Estimated: >60 mL/min (ref >60)
Glucose, Bld: 115 mg/dL — ABNORMAL HIGH (ref 70–99)
Potassium: 3.9 mmol/L (ref 3.5–5.1)
Sodium: 140 mmol/L (ref 135–145)
Total Bilirubin: 0.4 mg/dL (ref 0.3–1.2)
Total Protein: 7.6 g/dL (ref 6.5–8.1)

## 2021-11-25 LAB — SAMPLE TO BLOOD BANK

## 2021-11-25 NOTE — Assessment & Plan Note (Signed)
Clinically, she has no signs of residual disease I recommend follow-up with gynecologist  She is educated to watch out for signs and symptoms of cancer recurrence

## 2021-11-25 NOTE — Assessment & Plan Note (Signed)
Her chronic ITP is stable We discussed several factors that could cause nosebleed in the wintertime Icsuggest potential use of humidifier, reduce the use of NSAID and to check her blood pressure regularly in case of blood pressure is contributing to nosebleeds She does not need transfusion support or additional treatment for chronic ITP

## 2021-11-25 NOTE — Telephone Encounter (Signed)
Rachel Rivas and scheduled follow up appointment with Dr. Delsa Sale on 12/30/21.  She verbalized understanding of appointment details.

## 2021-11-25 NOTE — Progress Notes (Signed)
[place] OFFICE PROGRESS NOTE  Patient Care Team: [name], MD as PCP - General (Family Medicine) Buford Dresser, MD as PCP - Cardiology (Cardiology) Awanda Mink Craige Cotta, RN as Oncology Nurse Navigator (Oncology)  ASSESSMENT & PLAN:  Thrombocytopenia [facility]) Her chronic ITP is stable We discussed several factors that could cause nosebleed in the wintertime Icsuggest potential use of humidifier, reduce the use of NSAID and to check her blood pressure regularly in case of blood pressure is contributing to nosebleeds She does not need transfusion support or additional treatment for chronic ITP  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   All questions were answered. The patient knows to call the clinic with any problems, questions or concerns. The total time spent in the appointment was 20 minutes encounter with patients including review of chart and various tests results, discussions about plan of care and coordination of care plan   Heath Lark, MD 11/25/2021 12:38 PM  INTERVAL HISTORY: Please see below for problem oriented charting. she returns for urgent evaluation due to recurrent nosebleed 2 weeks ago, she had passage of clot on the left nostril and yesterday, she noted a tinge of blood also on the same nostril She denies spontaneous nosebleeds No other forms of bleeding such as hematuria or hematochezia She has chronic thrombocytopenia on observation She has not seen gynecologist since last year for her uterine cancer.  Denies abdominal bloating or changes in bowel habits  REVIEW OF SYSTEMS:   Constitutional: Denies fevers, chills or abnormal weight loss Eyes: Denies blurriness of vision Ears, nose, mouth, throat, and face: Denies  mucositis or sore throat Respiratory: Denies cough, dyspnea or wheezes Cardiovascular: Denies palpitation, chest discomfort or lower extremity swelling Gastrointestinal:  Denies nausea, heartburn or change in bowel habits Skin: Denies abnormal skin rashes Lymphatics: Denies new lymphadenopathy or easy bruising Neurological:Denies numbness, tingling or new weaknesses Behavioral/Psych: Mood is stable, no new changes  All other systems were reviewed with the patient and are negative.  I have reviewed the past medical history, past surgical history, social history and family history with the patient and they are unchanged from previous note.  ALLERGIES:  is allergic to latex, sulfa antibiotics, and elemental sulfur.  MEDICATIONS:  Current Outpatient Medications  Medication Sig Dispense Refill   albuterol (VENTOLIN HFA) 108 (90 Base) MCG/ACT inhaler Inhale 2 puffs into the lungs every 6 (six) hours as needed for wheezing or shortness of breath. 8 g 2   cyclobenzaprine (FLEXERIL) 10 MG tablet Take 1 tablet (10 mg total) by mouth at bedtime as needed for muscle spasms. 15 tablet 0   famotidine (PEPCID) 20 MG tablet TAKE 1 TABLET BY MOUTH TWICE DAILY FOR HEART 458 tablet 0   folic acid (FOLVITE) 1 MG tablet Take 2 mg by mouth daily.     ibuprofen (ADVIL) 800 MG tablet Take 1 tablet (800 mg total) by mouth 3 (three) times daily. 30 tablet 2   losartan-hydrochlorothiazide (HYZAAR) 50-12.5 MG tablet Take 1 tablet by mouth daily. 90 tablet 3   Multiple Vitamin (MULTIVITAMIN ADULT PO) Take by mouth.     rosuvastatin (CRESTOR) 5 MG tablet Take 1 tablet (5 mg total) by mouth daily. 90 tablet 3   triamcinolone (KENALOG) 0.1 % Apply 1 application topically  2 (two) times daily. 30 g 0   VITAMIN D PO Take by mouth.     No current facility-administered medications for this visit.    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 She saw her PCP on 4/13 for bleeding and an ultrasound was obtained showing a thickened endometrial lining and 3-4cm mass within the endocervical canal. She was [facility], started on Megace and ultimately underwent a TRH/BSO on 5/18 for abnormal uterine bleeding with resulting anemia, large prolapsing fibroid.    03/06/2020 Imaging   US pelvis Markedly thickened heterogeneous, and hypervascular endometrial complex up to 38 mm thick highly concerning for endometrial neoplasm.   Additional more focal 3.0 x 2.8 x 5.3 cm diameter mass within endocervical canal, may represent extension of above endometrial pathology versus separate endocervical tumor.   Tissue diagnosis recommended.   Nonvisualization of ovaries.   04/07/2020 Pathology Results   A. UTERUS, CERVIX AND BILATERAL FALLOPIAN TUBES AND OVARIES, HYSTERECTOMY AND BILATERAL SALPINGO-OOPHORECTOMY: - Mullerian adenosarcoma of endometrium. - Tumor limited to the uterus. - Tumor invades to less than half of the myometrium. - No involvement of adnexa. - Margins of resection are not involved. - See oncology table and comment. ONCOLOGY TABLE: UTERUS, SARCOMA: Procedure: Total hysterectomy and bilateral salpingo-oophorectomy Specimen Integrity: The uterus, cervix and bilateral fallopian tubes and ovaries were received intact. The nodule clinically identified as vaginally prolapsed uterine fibroid was received separate and disrupted. Tumor Size: Greatest dimension: 7.5 cm Histologic Type: Mullerian adenosarcoma with overgrowth of the epithelial and stromal components Histologic Grade: The epithelial component has proliferated to form endometrioid adenocarcinoma which ranges from low grade to high grade. The stromal component focally shows sarcomatous overgrowth. Myometrial Invasion: Present Depth of invasion: 4  mm Myometrial thickness: 30 mm Other Tissue/Organ Involvement: Not identified Margins: Uninvolved by sarcoma Lymphovascular Invasion: Not identified Regional Lymph Nodes: No lymph nodes submitted or found Pathologic Stage Classification (pTNM, AJCC 8th Edition): pT1b, pNX Additional Pathologic Findings: Adenomyosis. Leiomyomata.   04/07/2020 Surgery   PRE-OPERATIVE DIAGNOSIS:  Large prolapsed fibroid vaginally, menometrorrhagia with anemia   POST-OPERATIVE DIAGNOSIS:  Large prolapsed fibroid vaginally, menometrorrhagia with anemia.  Omental adhesions with anterior abdominal wall.   PROCEDURE:  Procedure(s): XI ROBOTIC TOTAL LAPAROSCOPIC HYSTERECTOMY, BILATERAL SALPINGO OOPHORECTOMY/LYSIS OF ADHESIONS/VAGINAL MYOMECTOMY    FINDINGS: Large necrotic vaginally prolapsed uterine myoma.  Uterus with small subserosal/intramural myoma.  Bilateral tubes post tubal ligation.  Bilateral normal ovaries.  Omental adhesions with anterior abdominal wall.   05/01/2020 Imaging   Ct scan of chest, abdomen and pelvis 1. Multiple ill-defined low density liver masses, suspicious for metastases. These could be further characterized with pre and postcontrast magnetic resonance imaging of the liver. 2. Moderate diffuse vaginal wall thickening, possibly representing edema associated with the patient's recent surgery. 3. Small hiatal hernia. 4. Minimal coronary artery atheromatous calcifications. 5. Cholelithiasis. 6. Tiny rounded area of low density in the posterior spleen, too small to characterize.   05/11/2020 Cancer Staging   Staging form: Corpus Uteri - Adenosarcoma, AJCC 8th Edition - Pathologic: Stage IB (pT1b, pN0, cM0) - Signed by Heath Lark, MD on 05/15/2020    05/14/2020 Imaging   MRI liver 1. The 2 dominant right hepatic lobe lesions have MR imaging features most consistent with benign cavernous hemangioma. The remaining tiny liver lesions are  too small to characterize. Statistically, while these  are likely benign, follow-up MRI in 3 months recommended to ensure stability. 2. No other findings to suggest metastatic disease.   05/22/2020 Procedure   Placement of a subcutaneous port device. Catheter tip at the superior cavoatrial junction   05/29/2020 - 09/11/2020 Chemotherapy   The patient had carboplatin and taxol for chemotherapy treatment.     07/22/2020 Imaging   1. Status post hysterectomy and bilateral oophorectomy. Irregular soft tissue density in the region of the vaginal cuff is relatively similar to on the prior exam. Although this could represent developing scar or chronic hematoma, residual disease cannot be excluded. This could either be re-evaluated at follow-up or if a more aggressive approach is desired, characterized with PET. 2. No other evidence of metastatic disease in the abdomen or pelvis. 3. Hepatic hemangiomas, as before. Smaller liver lesions are unchanged, favoring a benign etiology. 4. Cholelithiasis. 5. Tiny hiatal hernia. 6. Aortic Atherosclerosis (ICD10-I70.0).   10/12/2020 Imaging   1. Stable appearance of the vaginal cuff status post hysterectomy. Soft tissue fullness appears unchanged, likely postsurgical in etiology. Correlate with physical examination and tumor markers. 2. No evidence of metastatic disease. 3. Stable hepatic hemangiomas. 4. Cholelithiasis without evidence of cholecystitis or biliary dilatation. 5. Aortic Atherosclerosis (ICD10-I70.0).   04/12/2021 Imaging   1. Status post hysterectomy. Unchanged appearance of somewhat nodular soft tissue at the left and right aspects of the vaginal cuff, again favored to represent postoperative change without specific findings or evidence of progression to suggest local malignant recurrence. 2. No noncontrast evidence of metastatic disease in the abdomen or pelvis. 3. Cholelithiasis.   Aortic Atherosclerosis (ICD10-I70.0).   04/30/2021 Procedure   Removal of implanted Port-A-Cath utilizing sharp and  blunt dissection. The procedure was uncomplicated.     PHYSICAL EXAMINATION: ECOG PERFORMANCE STATUS: 0 - Asymptomatic  Vitals:   11/25/21 0825  BP: (!) 143/85  Pulse: 76  Resp: 17  Temp: 97.9 F (36.6 C)  SpO2: 99%   Filed Weights   11/25/21 0825  Weight: 213 lb 6.4 oz (96.8 kg)    GENERAL:alert, no distress and comfortable NEURO: alert & oriented x 3 with fluent speech, no focal motor/sensory deficits  LABORATORY DATA:  I have reviewed the data as listed    Component Value Date/Time   NA 140 11/25/2021 0803   NA 142 01/28/2021 0922   NA 142 04/07/2017 0810   K 3.9 11/25/2021 0803   K 3.8 04/07/2017 0810   CL 104 11/25/2021 0803   CO2 30 11/25/2021 0803   CO2 28 04/07/2017 0810   GLUCOSE 115 (H) 11/25/2021 0803   GLUCOSE 100 04/07/2017 0810   BUN 19 11/25/2021 0803   BUN 17 01/28/2021 0922   BUN 16.8 04/07/2017 0810   CREATININE 0.88 11/25/2021 0803   CREATININE 0.78 04/12/2021 1301   CREATININE 0.9 04/07/2017 0810   CALCIUM 9.2 11/25/2021 0803   CALCIUM 9.9 04/07/2017 0810   PROT 7.6 11/25/2021 0803   PROT 7.6 01/28/2021 0922   PROT 8.0 04/07/2017 0810   ALBUMIN 4.2 11/25/2021 0803   ALBUMIN 4.8 01/28/2021 0922   ALBUMIN 4.4 04/07/2017 0810   AST 14 (L) 11/25/2021 0803   AST 15 04/12/2021 1301   AST 18 04/07/2017 0810   ALT 15 11/25/2021 0803   ALT 16 04/12/2021 1301   ALT 23 04/07/2017 0810   ALKPHOS 68 11/25/2021 0803   ALKPHOS 79 04/07/2017 0810   BILITOT 0.4 11/25/2021 0803   BILITOT 0.5  04/12/2021 1301   BILITOT 0.70 04/07/2017 0810   GFRNONAA >60 11/25/2021 0803   GFRNONAA >60 04/12/2021 1301   GFRAA >60 08/21/2020 0805    No results found for: SPEP, UPEP  Lab Results  Component Value Date   WBC 6.4 11/25/2021   NEUTROABS 3.2 11/25/2021   HGB 11.2 (L) 11/25/2021   HCT 35.4 (L) 11/25/2021   MCV 82.9 11/25/2021   PLT 66 (L) 11/25/2021      Chemistry      Component Value Date/Time   NA 140 11/25/2021 0803   NA 142 01/28/2021  0922   NA 142 04/07/2017 0810   K 3.9 11/25/2021 0803   K 3.8 04/07/2017 0810   CL 104 11/25/2021 0803   CO2 30 11/25/2021 0803   CO2 28 04/07/2017 0810   BUN 19 11/25/2021 0803   BUN 17 01/28/2021 0922   BUN 16.8 04/07/2017 0810   CREATININE 0.88 11/25/2021 0803   CREATININE 0.78 04/12/2021 1301   CREATININE 0.9 04/07/2017 0810      Component Value Date/Time   CALCIUM 9.2 11/25/2021 0803   CALCIUM 9.9 04/07/2017 0810   ALKPHOS 68 11/25/2021 0803   ALKPHOS 79 04/07/2017 0810   AST 14 (L) 11/25/2021 0803   AST 15 04/12/2021 1301   AST 18 04/07/2017 0810   ALT 15 11/25/2021 0803   ALT 16 04/12/2021 1301   ALT 23 04/07/2017 0810   BILITOT 0.4 11/25/2021 0803   BILITOT 0.5 04/12/2021 1301   BILITOT 0.70 04/07/2017 0810

## 2021-11-30 ENCOUNTER — Ambulatory Visit: Payer: Self-pay

## 2021-11-30 NOTE — Telephone Encounter (Signed)
Pt called, she was asking if she can go around friend that was diagnosed with shingles on Sunday. Advised pt since she is still immunosuppressed I prefer her to call and ask her Oncology  provider. Pt understood and agreed she would call in the morning.    Reason for Disposition  Health Information question, no triage required and triager able to answer question  Answer Assessment - Initial Assessment Questions 1. REASON FOR CALL or QUESTION: "What is your reason for calling today?" or "How can I best help you?" or "What question do you have that I can help answer?"     Pt asking if can go around friend that has shingles  Protocols used: Information Only Call - No Triage-A-AH

## 2021-12-07 LAB — HM DIABETES EYE EXAM

## 2021-12-21 ENCOUNTER — Encounter: Payer: Self-pay | Admitting: Obstetrics & Gynecology

## 2021-12-23 ENCOUNTER — Telehealth: Payer: Self-pay | Admitting: Family Medicine

## 2021-12-23 DIAGNOSIS — T451X5A Adverse effect of antineoplastic and immunosuppressive drugs, initial encounter: Secondary | ICD-10-CM

## 2021-12-23 DIAGNOSIS — E1169 Type 2 diabetes mellitus with other specified complication: Secondary | ICD-10-CM

## 2021-12-23 NOTE — Telephone Encounter (Signed)
-----   Message from Ellamae Sia sent at 12/23/2021  3:40 PM EST ----- Regarding: Lab orders for Friday, 2.17.23 Patient is scheduled for CPX labs, please order future labs, Thanks , Karna Christmas

## 2021-12-29 NOTE — Progress Notes (Signed)
Follow Up Note: Gyn-Onc  Rachel Rivas 61 y.o. female  CC: She presents for a f/u visi   HPI: The oncology history was reviewed.  Interval History: She denies any vaginal bleeding, abdominal/pelvic pain, cough, lethargy or increasing abdominal girth. A submental mass was noted on the last exam.  On a f/u U/S the mass was felt to represent a mildly enlarged lymph node.    Review of Systems  Review of Systems  Constitutional:  Negative for malaise/fatigue and weight loss.  Respiratory:  Negative for shortness of breath and wheezing.   Cardiovascular:  Negative for chest pain and leg swelling.  Gastrointestinal:  Negative for abdominal pain, blood in stool, constipation, nausea and vomiting.  Genitourinary:  Negative for dysuria, frequency, hematuria and urgency.  Musculoskeletal:  Negative for joint pain and myalgias.  Neurological:  Negative for weakness.  Psychiatric/Behavioral:  Negative for depression. The patient does not have insomnia.    Current medications, allergy, social history, past surgical history, past medical history, family history were all reviewed.    Vitals:  BP 117/64 (BP Location: Left Arm, Patient Position: Sitting)    Pulse 63    Temp 97.9 F (36.6 C) (Oral)    Resp 18    Ht 5' 4.57" (1.64 m)    Wt 212 lb 6.4 oz (96.3 kg)    LMP 11/17/2012    SpO2 100%    BMI 35.82 kg/m    Physical Exam:  Physical Exam Exam conducted with a chaperone present.  Constitutional:      General: She is not in acute distress. Cardiovascular:     Rate and Rhythm: Normal rate and regular rhythm.  Pulmonary:     Effort: Pulmonary effort is normal.     Breath sounds: Normal breath sounds. No wheezing or rhonchi.  Abdominal:     Palpations: Abdomen is soft.     Tenderness: There is no abdominal tenderness. There is no right CVA tenderness or left CVA tenderness.     Hernia: No hernia is present.  Genitourinary:    General: Normal vulva.     Urethra: No urethral lesion.      Vagina: No lesions. No bleeding Musculoskeletal:     Cervical back: Neck supple.     Right lower leg: No edema.     Left lower leg: No edema.  Lymphadenopathy:     Upper Body:     Right upper body: No supraclavicular adenopathy.     Left upper body: No supraclavicular adenopathy.     Lower Body: No right inguinal adenopathy. No left inguinal adenopathy.  Skin:    Findings: No rash.  Neurological:     Mental Status: She is oriented to person, place, and time.   Assessment/Plan: Adenosarcoma of body of uterus ([place]) 61 yo w/incidental diagnosis of stage IB adenosarcoma of the uterus removed with hysterectomy by Dr Dellis Filbert on 04/07/20.  S/p adjuvant chemotherapy with 6 cycles of carboplatin and paclitaxel completed October, 2021. Negative symptom review, normal exam.  No evidence of recurrence  >continue q 3 monthly surveillance examinations with symptom review, physical exam alternating w/MED-ONC    Lahoma Crocker, MD

## 2021-12-29 NOTE — Assessment & Plan Note (Addendum)
61 yo w/incidental diagnosis of stage IB adenosarcoma of the uterus removed with hysterectomy by Dr Dellis Filbert on 04/07/20.  S/p adjuvant chemotherapy with 6 cycles of carboplatin and paclitaxel completed October, 2021. Negative symptom review, normal exam.  No evidence of recurrence  >continue q 3 monthly surveillance examinations with symptom review, physical exam alternating w/MED-ONC

## 2021-12-30 ENCOUNTER — Encounter: Payer: Self-pay | Admitting: Hematology and Oncology

## 2021-12-30 ENCOUNTER — Inpatient Hospital Stay: Payer: BC Managed Care – PPO | Attending: Hematology and Oncology | Admitting: Obstetrics & Gynecology

## 2021-12-30 ENCOUNTER — Encounter: Payer: Self-pay | Admitting: Obstetrics & Gynecology

## 2021-12-30 ENCOUNTER — Other Ambulatory Visit: Payer: Self-pay

## 2021-12-30 VITALS — BP 117/64 | HR 63 | Temp 97.9°F | Resp 18 | Ht 64.57 in | Wt 212.4 lb

## 2021-12-30 DIAGNOSIS — Z8542 Personal history of malignant neoplasm of other parts of uterus: Secondary | ICD-10-CM | POA: Diagnosis not present

## 2021-12-30 DIAGNOSIS — Z90710 Acquired absence of both cervix and uterus: Secondary | ICD-10-CM | POA: Diagnosis not present

## 2021-12-30 DIAGNOSIS — Z9221 Personal history of antineoplastic chemotherapy: Secondary | ICD-10-CM | POA: Insufficient documentation

## 2021-12-30 DIAGNOSIS — C549 Malignant neoplasm of corpus uteri, unspecified: Secondary | ICD-10-CM

## 2021-12-30 NOTE — Patient Instructions (Signed)
Return in 6 mos

## 2022-01-06 ENCOUNTER — Encounter: Payer: Self-pay | Admitting: Family Medicine

## 2022-01-06 IMAGING — US US PELVIS COMPLETE WITH TRANSVAGINAL
1 series · 13 of 25 positions shown · non-contrast
Comparison: None

CLINICAL DATA: Postmenopausal bleeding, pelvic pain

EXAM:
TRANSABDOMINAL AND TRANSVAGINAL ULTRASOUND OF PELVIS
TECHNIQUE: Both transabdominal and transvaginal ultrasound examinations of the
pelvis were performed. Transabdominal technique was performed for
global imaging of the pelvis including uterus, ovaries, adnexal
regions, and pelvic cul-de-sac. It was necessary to proceed with
endovaginal exam following the transabdominal exam to visualize the
endometrium and ovaries.

[Series 1: us pelvis complete with transvaginal · 0.26mm/px · 67 acquisitions, 13 frames shown]
[im 1/67]
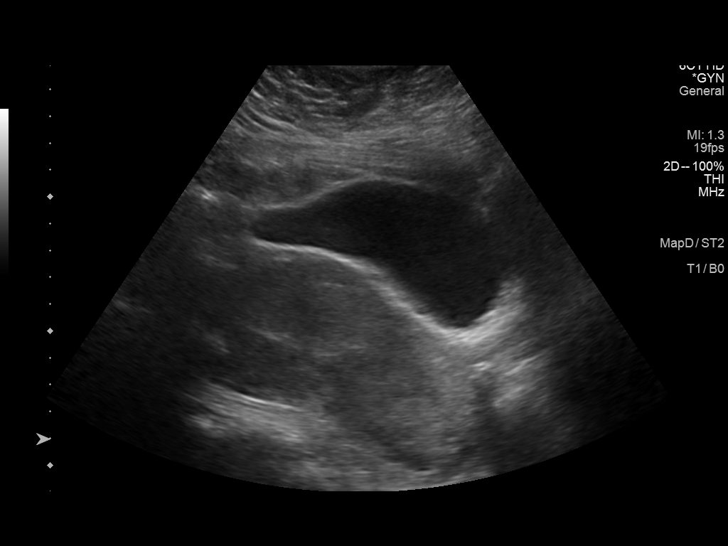
[im 6/67]
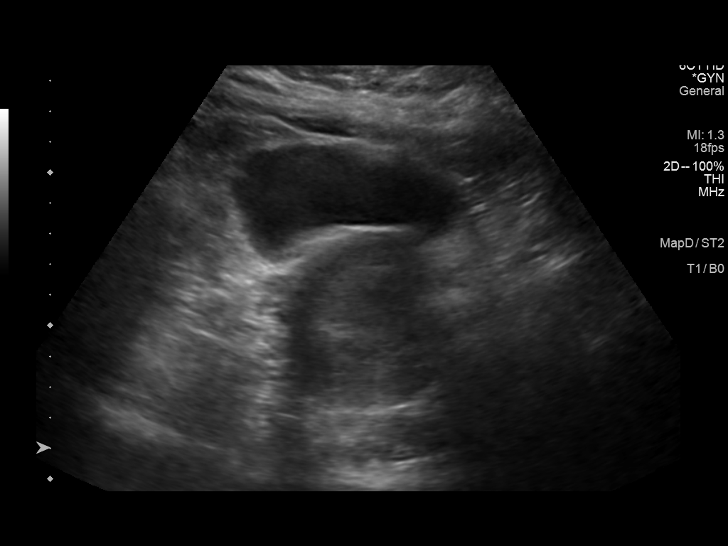
[im 12/67]
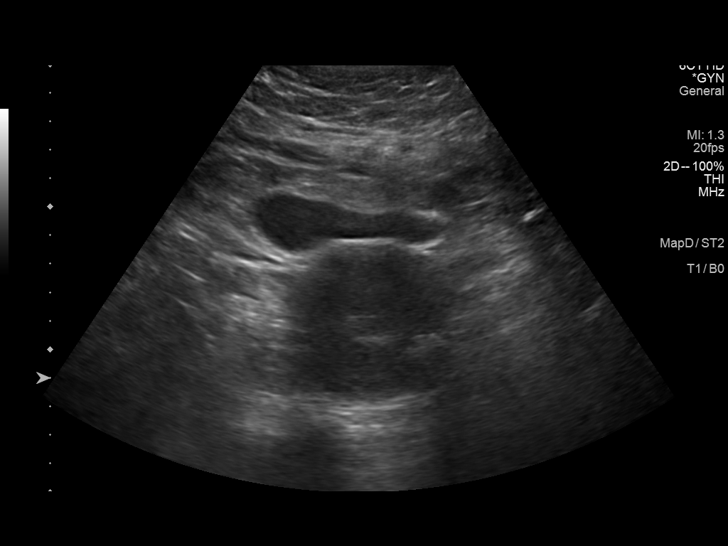
[im 17/67]
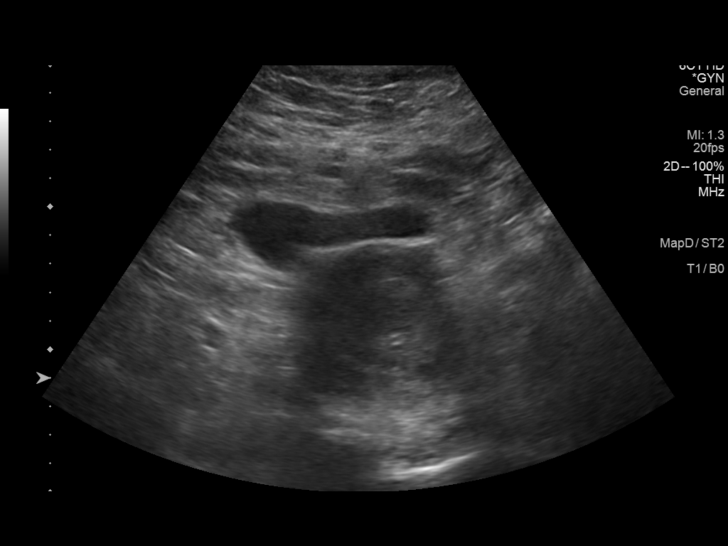
[im 23/67]
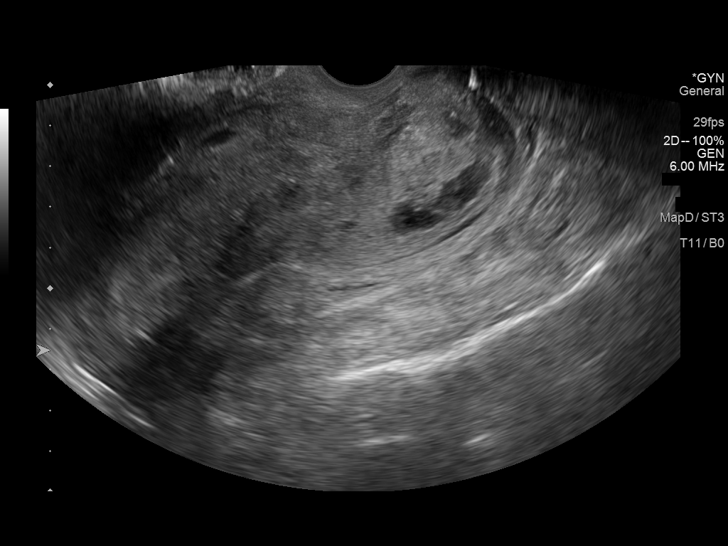
[im 28/67]
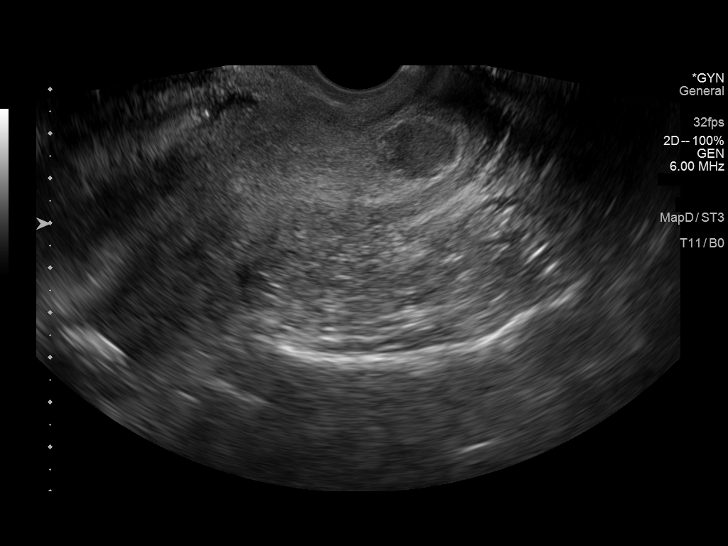
[im 34/67]
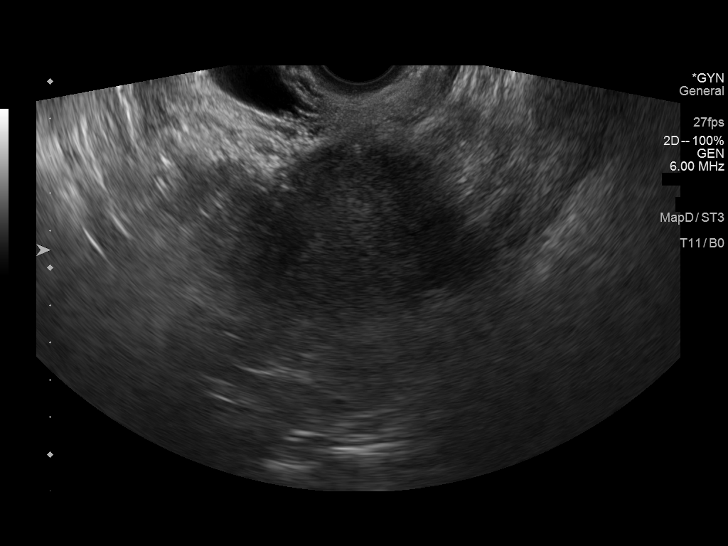
[im 39/67]
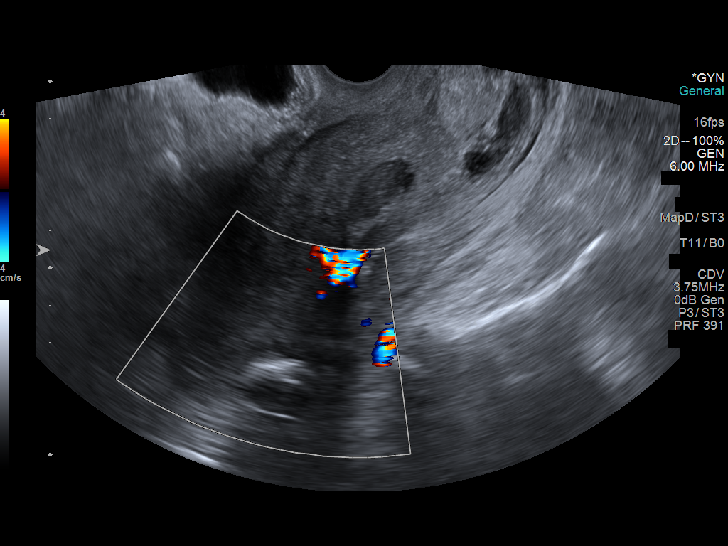
[im 45/67]
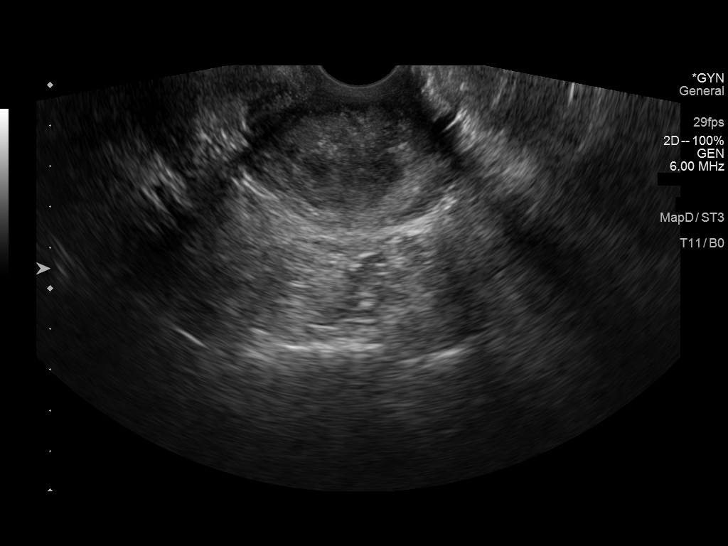
[im 50/67]
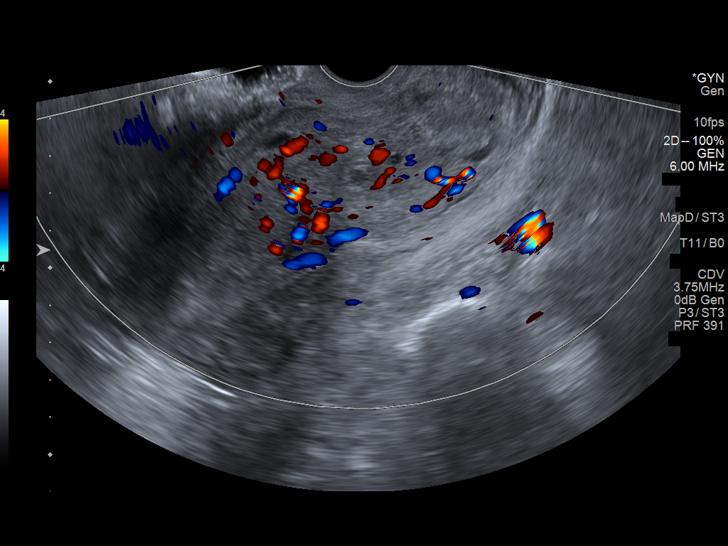
[im 56/67]
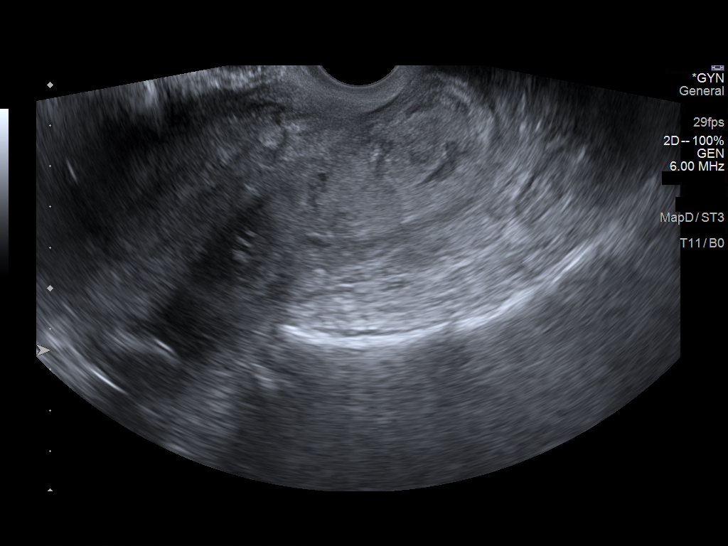
[im 61/67]
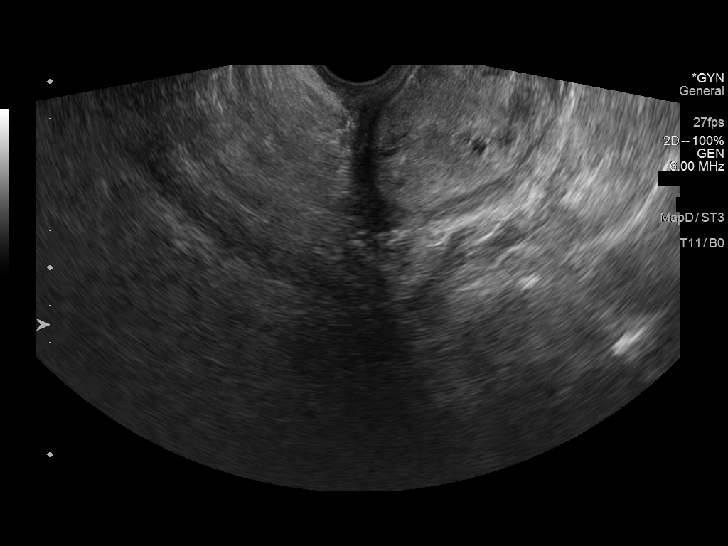
[im 67/67]
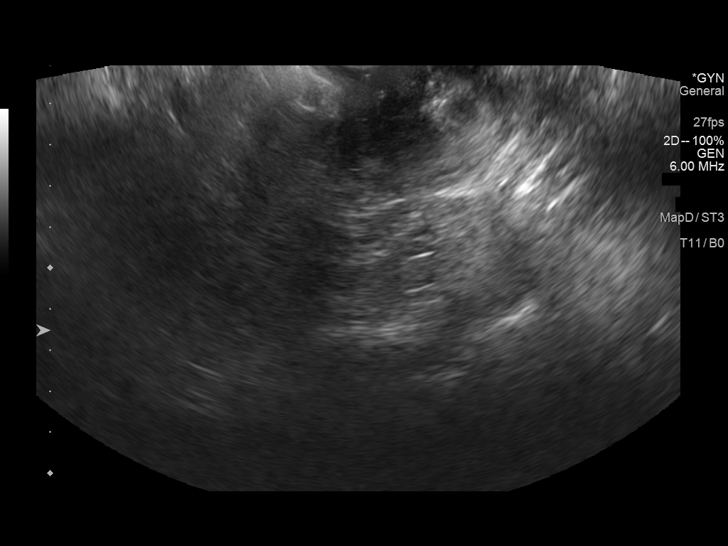

[13 of 25 positions shown; findings below may reference images not displayed]

FINDINGS: Uterus

Measurements: 12.5 x 5.0 x 5.3 cm = volume: 172 mL. Anteverted.
Heterogeneous myometrial echogenicity. Calcifications at the
posterior upper uterus measuring 2.0 cm and 1.8 cm likely small
calcified subserosal leiomyomata. No additional definite myometrial
mass

Endometrium

Thickness: 38 mm. Markedly heterogeneous. Several small cysts.
Markedly hypervascular internally. Neoplasm not excluded. Additional
mixed echogenicity mass at cervix/endocervical callus 3.0 x 2.8 x
5.3 cm, may be related to extension of the endometrial mass into the
cervix, a prolapsed endometrial polyp, or separate endocervical
mass.

Right ovary

Not visualized, likely obscured by bowel

Left ovary

Not visualized, likely obscured by bowel

Other findings

No free pelvic fluid.  No adnexal masses.
IMPRESSION: Markedly thickened heterogeneous, and hypervascular endometrial
complex up to 38 mm thick highly concerning for endometrial
neoplasm.

Additional more focal 3.0 x 2.8 x 5.3 cm diameter mass within
endocervical canal, may represent extension of above endometrial
pathology versus separate endocervical tumor.

Tissue diagnosis recommended.

Nonvisualization of ovaries.

These results will be called to the ordering clinician or
representative by the Radiologist Assistant, and communication
documented in the PACS or [REDACTED].

## 2022-01-07 ENCOUNTER — Other Ambulatory Visit: Payer: Self-pay

## 2022-01-07 ENCOUNTER — Other Ambulatory Visit (INDEPENDENT_AMBULATORY_CARE_PROVIDER_SITE_OTHER): Payer: BC Managed Care – PPO

## 2022-01-07 DIAGNOSIS — E1169 Type 2 diabetes mellitus with other specified complication: Secondary | ICD-10-CM

## 2022-01-07 LAB — COMPREHENSIVE METABOLIC PANEL
ALT: 11 U/L (ref 0–35)
AST: 12 U/L (ref 0–37)
Albumin: 4.4 g/dL (ref 3.5–5.2)
Alkaline Phosphatase: 63 U/L (ref 39–117)
BUN: 21 mg/dL (ref 6–23)
CO2: 34 mEq/L — ABNORMAL HIGH (ref 19–32)
Calcium: 9.8 mg/dL (ref 8.4–10.5)
Chloride: 102 mEq/L (ref 96–112)
Creatinine, Ser: 0.8 mg/dL (ref 0.40–1.20)
GFR: 79.08 mL/min (ref >60.00)
Glucose, Bld: 110 mg/dL — ABNORMAL HIGH (ref 70–99)
Potassium: 3.6 mEq/L (ref 3.5–5.1)
Sodium: 140 mEq/L (ref 135–145)
Total Bilirubin: 0.6 mg/dL (ref 0.2–1.2)
Total Protein: 7.7 g/dL (ref 6.0–8.3)

## 2022-01-07 LAB — LIPID PANEL
Cholesterol: 171 mg/dL (ref 0–200)
HDL: 73 mg/dL (ref >39.00)
LDL Cholesterol: 83 mg/dL (ref 0–99)
NonHDL: 98.12
Total CHOL/HDL Ratio: 2
Triglycerides: 76 mg/dL (ref 0.0–149.0)
VLDL: 15.2 mg/dL (ref 0.0–40.0)

## 2022-01-07 LAB — HEMOGLOBIN A1C: Hgb A1c MFr Bld: 6.5 % (ref 4.6–6.5)

## 2022-01-08 NOTE — Progress Notes (Signed)
No critical labs need to be addressed urgently. We will discuss labs in detail at upcoming office visit.   

## 2022-01-14 ENCOUNTER — Encounter: Payer: Self-pay | Admitting: Family Medicine

## 2022-01-14 ENCOUNTER — Other Ambulatory Visit: Payer: Self-pay

## 2022-01-14 ENCOUNTER — Ambulatory Visit (INDEPENDENT_AMBULATORY_CARE_PROVIDER_SITE_OTHER): Payer: BC Managed Care – PPO | Admitting: Family Medicine

## 2022-01-14 VITALS — BP 110/70 | HR 59 | Temp 97.8°F | Ht 63.5 in | Wt 212.1 lb

## 2022-01-14 DIAGNOSIS — I152 Hypertension secondary to endocrine disorders: Secondary | ICD-10-CM

## 2022-01-14 DIAGNOSIS — M069 Rheumatoid arthritis, unspecified: Secondary | ICD-10-CM

## 2022-01-14 DIAGNOSIS — Z8542 Personal history of malignant neoplasm of other parts of uterus: Secondary | ICD-10-CM | POA: Diagnosis not present

## 2022-01-14 DIAGNOSIS — E1159 Type 2 diabetes mellitus with other circulatory complications: Secondary | ICD-10-CM | POA: Diagnosis not present

## 2022-01-14 DIAGNOSIS — T451X5A Adverse effect of antineoplastic and immunosuppressive drugs, initial encounter: Secondary | ICD-10-CM

## 2022-01-14 DIAGNOSIS — Z0000 Encounter for general adult medical examination without abnormal findings: Secondary | ICD-10-CM

## 2022-01-14 DIAGNOSIS — H8110 Benign paroxysmal vertigo, unspecified ear: Secondary | ICD-10-CM

## 2022-01-14 DIAGNOSIS — E1169 Type 2 diabetes mellitus with other specified complication: Secondary | ICD-10-CM | POA: Diagnosis not present

## 2022-01-14 DIAGNOSIS — J4521 Mild intermittent asthma with (acute) exacerbation: Secondary | ICD-10-CM

## 2022-01-14 DIAGNOSIS — Z6836 Body mass index (BMI) 36.0-36.9, adult: Secondary | ICD-10-CM

## 2022-01-14 DIAGNOSIS — D696 Thrombocytopenia, unspecified: Secondary | ICD-10-CM

## 2022-01-14 DIAGNOSIS — G620 Drug-induced polyneuropathy: Secondary | ICD-10-CM

## 2022-01-14 DIAGNOSIS — E2839 Other primary ovarian failure: Secondary | ICD-10-CM

## 2022-01-14 DIAGNOSIS — E785 Hyperlipidemia, unspecified: Secondary | ICD-10-CM

## 2022-01-14 DIAGNOSIS — K219 Gastro-esophageal reflux disease without esophagitis: Secondary | ICD-10-CM

## 2022-01-14 DIAGNOSIS — E66812 Obesity, class 2: Secondary | ICD-10-CM

## 2022-01-14 LAB — HM DIABETES FOOT EXAM

## 2022-01-14 MED ORDER — FAMOTIDINE 20 MG PO TABS: ORAL_TABLET | ORAL | 1 refills | Status: DC

## 2022-01-14 NOTE — Patient Instructions (Addendum)
Start home desensitization exercises as discussed.  Work on healthy eating and low carb diet, increase exercise.  Can try 1/2 tab of Crestor 2-3 times a week or try Coenzyme Q10 daily for muscle ache side effect.  Call Dr. Havery Moros to set up colonoscopy.  Call  to add bone density to mammogram appointment.

## 2022-01-14 NOTE — Assessment & Plan Note (Signed)
Plans to return to see RHeum, for consdieration of restarting the methotrexate.

## 2022-01-14 NOTE — Assessment & Plan Note (Signed)
Significantly improved since being off chemo.

## 2022-01-14 NOTE — Progress Notes (Signed)
Patient ID: Tisheena Maguire, female    DOB: 08/31/1961, 61 y.o.   MRN: [id number]  This visit was conducted in person.  LMP 11/17/2012    CC: Chief Complaint  Patient presents with   Annual Exam     Subjective:   HPI: Kerri-Anne Haeberle is a 61 y.o. female presenting on 01/14/2022 for annual exam.  Vertigo, recurrent: started with a spell this morning when woke up. Room spinning with leaning forward moving head., mild nausea.  Meclizine has helped in the past.  HX of Endometrial cancer: Followed by oncology Dr. Alvy Bimler. S/P hysterectomy and adjuvant chemotherapy.  Hypertension:   Well controlled on losartan HCTZ 50/12.5 mg daily BP Readings from Last 3 Encounters:  01/14/22 110/70  12/30/21 117/64  11/25/21 (!) 143/85  Using medication without problems or lightheadedness:  none Chest pain with exertion: none Edema:none Short of breath: none Average home BPs: Other issues:  Diabetes: Diet controlled. Lab Results  Component Value Date   HGBA1C 6.5 01/07/2022  Using medications without difficulties: Hypoglycemic episodes: Hyperglycemic episodes: Feet problems: Blood Sugars averaging: eye exam within last year: 11/2021 [facility]  Elevated Cholesterol: LDL at goal < 100 on crestor 5 mg occ.because causes leg cramping. Lab Results  Component Value Date   CHOL 171 01/07/2022   HDL 73.00 01/07/2022   LDLCALC 83 01/07/2022   TRIG 76.0 01/07/2022   CHOLHDL 2 01/07/2022  Using medications without problems: Muscle aches:  Diet compliance: moderate Exercise: none Other complaints:    Mild intermittent asthma:  rarely using albuterol inhaler.   Rheumatoid arthritis: she has not restarted methotrexate, but pain is fairly well controlled. No hand pain.  She plans to return to she rheumatologist as she has not seen her in a while.   Anemia and thrombocytopenia  ( due to autoimmune disease): Plt 66, Hg improved at 11.2 on 11/25/21  Body mass index is 36.99 kg/m. Wt  Readings from Last 3 Encounters:  01/14/22 212 lb 2 oz (96.2 kg)  12/30/21 212 lb 6.4 oz (96.3 kg)  11/25/21 213 lb 6.4 oz (96.8 kg)    Relevant past medical, surgical, family and social history reviewed and updated as indicated. Interim medical history since our last visit reviewed. Allergies and medications reviewed and updated. Outpatient Medications Prior to Visit  Medication Sig Dispense Refill   albuterol (VENTOLIN HFA) 108 (90 Base) MCG/ACT inhaler Inhale 2 puffs into the lungs every 6 (six) hours as needed for wheezing or shortness of breath. 8 g 2   cyclobenzaprine (FLEXERIL) 10 MG tablet Take 1 tablet (10 mg total) by mouth at bedtime as needed for muscle spasms. 15 tablet 0   famotidine (PEPCID) 20 MG tablet TAKE 1 TABLET BY MOUTH TWICE DAILY FOR HEART 169 tablet 0   folic acid (FOLVITE) 1 MG tablet Take 2 mg by mouth daily.     ibuprofen (ADVIL) 800 MG tablet Take 1 tablet (800 mg total) by mouth 3 (three) times daily. (Patient not taking: Reported on 12/21/2021) 30 tablet 2   losartan-hydrochlorothiazide (HYZAAR) 50-12.5 MG tablet Take 1 tablet by mouth daily. 90 tablet 3   Multiple Vitamin (MULTIVITAMIN ADULT PO) Take by mouth.     rosuvastatin (CRESTOR) 5 MG tablet Take 1 tablet (5 mg total) by mouth daily. 90 tablet 3   triamcinolone (KENALOG) 0.1 % Apply 1 application topically 2 (two) times daily. (Patient not taking: Reported on 12/21/2021) 30 g 0   VITAMIN D PO Take by mouth.  No facility-administered medications prior to visit.     Per HPI unless specifically indicated in ROS section below Review of Systems Objective:  LMP 11/17/2012   Wt Readings from Last 3 Encounters:  12/30/21 212 lb 6.4 oz (96.3 kg)  11/25/21 213 lb 6.4 oz (96.8 kg)  10/15/21 210 lb (95.3 kg)      Physical Exam Vitals and nursing note reviewed.  Constitutional:      General: She is not in acute distress.    Appearance: Normal appearance. She is well-developed. She is not ill-appearing  or toxic-appearing.  HENT:     Head: Normocephalic.     Right Ear: Hearing, tympanic membrane, ear canal and external ear normal.     Left Ear: Hearing, tympanic membrane, ear canal and external ear normal.     Nose: Nose normal.  Eyes:     General: Lids are normal. Lids are everted, no foreign bodies appreciated.     Conjunctiva/sclera: Conjunctivae normal.     Pupils: Pupils are equal, round, and reactive to light.  Neck:     Thyroid: No thyroid mass or thyromegaly.     Vascular: No carotid bruit.     Trachea: Trachea normal.  Cardiovascular:     Rate and Rhythm: Normal rate and regular rhythm.     Heart sounds: Normal heart sounds, S1 normal and S2 normal. No murmur heard.   No gallop.  Pulmonary:     Effort: Pulmonary effort is normal. No respiratory distress.     Breath sounds: Normal breath sounds. No wheezing, rhonchi or rales.  Abdominal:     General: Bowel sounds are normal. There is no distension or abdominal bruit.     Palpations: Abdomen is soft. There is no fluid wave or mass.     Tenderness: There is no abdominal tenderness. There is no guarding or rebound.     Hernia: No hernia is present.  Musculoskeletal:     Cervical back: Normal range of motion and neck supple.  Lymphadenopathy:     Cervical: No cervical adenopathy.  Skin:    General: Skin is warm and dry.     Findings: No rash.  Neurological:     Mental Status: She is alert and oriented to person, place, and time.     Cranial Nerves: No cranial nerve deficit.     Sensory: No sensory deficit.     Comments:  Vertigo with leaning back on table, nystagmus.  Psychiatric:        Mood and Affect: Mood is not anxious or depressed.        Speech: Speech normal.        Behavior: Behavior normal. Behavior is cooperative.        Judgment: Judgment normal.    Diabetic foot exam: Normal inspection No skin breakdown No calluses  Normal DP pulses Normal sensation to light touch and monofilament Nails normal      Results for orders placed or performed in visit on 01/07/22  Comprehensive metabolic panel  Result Value Ref Range   Sodium 140 135 - 145 mEq/L   Potassium 3.6 3.5 - 5.1 mEq/L   Chloride 102 96 - 112 mEq/L   CO2 34 (H) 19 - 32 mEq/L   Glucose, Bld 110 (H) 70 - 99 mg/dL   BUN 21 6 - 23 mg/dL   Creatinine, Ser 0.80 0.40 - 1.20 mg/dL   Total Bilirubin 0.6 0.2 - 1.2 mg/dL   Alkaline Phosphatase 63 39 - 117 U/L  AST 12 0 - 37 U/L   ALT 11 0 - 35 U/L   Total Protein 7.7 6.0 - 8.3 g/dL   Albumin 4.4 3.5 - 5.2 g/dL   GFR 79.08 >60.00 mL/min   Calcium 9.8 8.4 - 10.5 mg/dL  Lipid panel  Result Value Ref Range   Cholesterol 171 0 - 200 mg/dL   Triglycerides 76.0 0.0 - 149.0 mg/dL   HDL 73.00 >39.00 mg/dL   VLDL 15.2 0.0 - 40.0 mg/dL   LDL Cholesterol 83 0 - 99 mg/dL   Total CHOL/HDL Ratio 2    NonHDL 98.12   Hemoglobin A1c  Result Value Ref Range   Hgb A1c MFr Bld 6.5 4.6 - 6.5 %    This visit occurred during the SARS-CoV-2 public health emergency.  Safety protocols were in place, including screening questions prior to the visit, additional usage of staff PPE, and extensive cleaning of exam room while observing appropriate contact time as indicated for disinfecting solutions.   COVID 19 screen:  No recent travel or known exposure to COVID19 The patient denies respiratory symptoms of COVID 19 at this time. The importance of social distancing was discussed today.   Assessment and Plan The patient's preventative maintenance and recommended screening tests for an annual wellness exam were reviewed in full today. Brought up to date unless services declined.  Counselled on the importance of diet, exercise, and its role in overall health and mortality. The patient's FH and SH was reviewed, including their home life, tobacco status, and drug and alcohol status.   Mammo: 01/2021 normal  Colon:  02/2016 adenoma, Dr. Havery Moros, repeat in 5 years... DUE Former smoker minimal use, 3 pack  years, remote Vaccines: s/p COVID x 3, offered td and flu (refused).. can consider shingrix ( refused)   TAH s/p uterine cancer    Hep C neg  HIV: refused Bone density:  due   Problem List Items Addressed This Visit     Class 2 severe obesity due to excess calories with serious comorbidity and body mass index (BMI) of 36.0 to 36.9 in adult [facility]) (Chronic)   Peripheral neuropathy due to chemotherapy ([place]) (Chronic)    Significantly improved since being off chemo.      Rheumatoid arthritis ([place]) (Chronic)    Plans to return to see [facility], for consdieration of restarting the methotrexate.      BPPV (benign paroxysmal positional vertigo)     Treat with meclizine and desensitization.      Gastroesophageal reflux disease   Relevant Medications   famotidine (PEPCID) 20 MG tablet   History of endometrial cancer    Followed by oncology Dr. Alvy Bimler. S/P hysterectomy and adjuvant chemotherapy.      Hyperlipidemia associated with type 2 diabetes mellitus ([place])   Hypertension associated with type 2 diabetes mellitus (HCC)   Mild intermittent asthma with exacerbation    Stable, chronic.  Continue current medication.         Thrombocytopenia ([place])     followed by Dr. Alvy Bimler now. Likely due to autoimmune disease.       Type 2 diabetes mellitus with other circulatory complications ( HTN) (HCC)     Diet controlled... work on reducing carbs in diet and increase exercise ( start walking again).      Other Visit Diagnoses     Routine general medical examination at a health care facility    -  Primary   Estrogen deficiency       Relevant Orders  DG Bone Density        Meds ordered this encounter  Medications   famotidine (PEPCID) 20 MG tablet    Sig: TAKE 1 TABLET BY MOUTH TWICE DAILY FOR HEART    Dispense:  180 tablet    Refill:  1     Eliezer Lofts, MD

## 2022-01-14 NOTE — Assessment & Plan Note (Signed)
Stable, chronic.  Continue current medication.    

## 2022-01-14 NOTE — Assessment & Plan Note (Signed)
Treat with meclizine and desensitization.

## 2022-01-14 NOTE — Assessment & Plan Note (Signed)
followed by Dr. Alvy Bimler now. Likely due to autoimmune disease.

## 2022-01-14 NOTE — Assessment & Plan Note (Signed)
Followed by oncology Dr. Alvy Bimler. S/P hysterectomy and adjuvant chemotherapy.

## 2022-01-14 NOTE — Assessment & Plan Note (Signed)
Diet controlled... work on reducing carbs in diet and increase exercise ( start walking again).

## 2022-01-17 ENCOUNTER — Other Ambulatory Visit: Payer: Self-pay | Admitting: Cardiology

## 2022-01-17 DIAGNOSIS — I2510 Atherosclerotic heart disease of native coronary artery without angina pectoris: Secondary | ICD-10-CM

## 2022-01-17 NOTE — Telephone Encounter (Signed)
Rx(s) sent to pharmacy electronically.  

## 2022-01-28 ENCOUNTER — Encounter: Payer: Self-pay | Admitting: Family Medicine

## 2022-01-28 ENCOUNTER — Telehealth: Payer: Self-pay | Admitting: *Deleted

## 2022-01-28 DIAGNOSIS — Z6836 Body mass index (BMI) 36.0-36.9, adult: Secondary | ICD-10-CM

## 2022-01-28 MED ORDER — TRULICITY 0.75 MG/0.5ML ~~LOC~~ SOAJ: 0.7500 mg | SUBCUTANEOUS | 11 refills | Status: DC

## 2022-01-28 NOTE — Telephone Encounter (Signed)
Received fax from [facility] requesting PA for Trulicity.  Patient must have tried and failed Metformin. Have an intolerance/contraindication to Metformin or did a trial period that didn't yield desired results.  I do not see that patient has ever taken Metformin.  Please advise.  ?

## 2022-01-30 NOTE — Telephone Encounter (Addendum)
I sent a MyChart message to the patient regarding this. ? Given Trulicity was intended for weight loss and diabetes well controlled... metformin trial is not needed. If she is agreeable.. I can send in  [facility] for weight loss management instead,  Or do you have knowledge that this would not be covered without a metformin trial either? ?

## 2022-01-30 NOTE — Telephone Encounter (Signed)
If we do [facility] for weight loss and patient has done a 6 month program working on weight loss, we can probably get that approved hopefully. ?

## 2022-01-31 ENCOUNTER — Encounter (INDEPENDENT_AMBULATORY_CARE_PROVIDER_SITE_OTHER): Payer: Self-pay | Admitting: Family Medicine

## 2022-01-31 MED ORDER — SEMAGLUTIDE-WEIGHT MANAGEMENT 0.25 MG/0.5ML ~~LOC~~ SOAJ: 0.2500 mg | SUBCUTANEOUS | 11 refills | Status: DC

## 2022-01-31 NOTE — Addendum Note (Signed)
Addended by: Carter Kitten on: 01/31/2022 03:36 PM ? ? Modules accepted: Orders ? ?

## 2022-01-31 NOTE — Telephone Encounter (Signed)
She has verified multiple weight loss programs via MyChart > 6 months. ? I will send in [facility] ?

## 2022-01-31 NOTE — Telephone Encounter (Signed)
Spoke to patient by telephone and was advised that she would like the try the medication. ?

## 2022-01-31 NOTE — Addendum Note (Signed)
Addended byEliezer Lofts E on: 01/31/2022 11:18 AM ? ? Modules accepted: Orders ? ?

## 2022-01-31 NOTE — Telephone Encounter (Signed)
PA completed on [place].  Medication is not covered by insurance plan.  Patient notified of this via [place].  I did recommend that she look for online coupons.  FYI to Dr. Diona Browner.  ?

## 2022-02-01 ENCOUNTER — Telehealth: Payer: Self-pay | Admitting: Family Medicine

## 2022-02-01 NOTE — Telephone Encounter (Signed)
See MyChart message patient also sent today. ?

## 2022-02-01 NOTE — Telephone Encounter (Signed)
Pt called asking for a return call to give you the information that you need for the medication that she needs. Please advise. ?

## 2022-02-01 NOTE — Telephone Encounter (Signed)
Dr.Wallace °

## 2022-02-01 NOTE — Telephone Encounter (Signed)
I will attempt to call for the PA.  What medication would you like me to try and get approved?  Trulicity or Mancel Parsons?   ?

## 2022-02-02 DIAGNOSIS — Z1231 Encounter for screening mammogram for malignant neoplasm of breast: Secondary | ICD-10-CM | POA: Diagnosis not present

## 2022-02-02 LAB — HM MAMMOGRAPHY

## 2022-02-02 LAB — HM DIABETES EYE EXAM

## 2022-02-04 ENCOUNTER — Encounter: Payer: Self-pay | Admitting: Family Medicine

## 2022-02-08 NOTE — Telephone Encounter (Signed)
Received a letter from [employer] denying coverage for [employer]. ?Letter is in your in box. ?

## 2022-02-08 NOTE — Telephone Encounter (Signed)
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  I do not have documentation that she has either of these issues either personal or family history.  Please verify with the patient. ? ?These may have been entered incorrectly on the prior authorization if she does not have either of these we should retry the prior authorization correctly. ? ? ?

## 2022-02-09 MED ORDER — OZEMPIC (0.25 OR 0.5 MG/DOSE) 2 MG/1.5ML ~~LOC~~ SOPN: 0.2500 mg | PEN_INJECTOR | SUBCUTANEOUS | 5 refills | Status: DC

## 2022-02-09 NOTE — Addendum Note (Signed)
Addended by: Carter Kitten on: 02/09/2022 04:09 PM ? ? Modules accepted: Orders ? ?

## 2022-02-09 NOTE — Telephone Encounter (Signed)
PA for Ozempic Approved.  PA Case: [id number], Status: Approved, Coverage Starts on: 02/09/2022 12:00:00 AM, Coverage Ends on: 02/09/2023 12:00:00 AM.  Rx for Ozempic Rx sent to [facility].  Patient notified of approval via telephone.  ?

## 2022-02-09 NOTE — Telephone Encounter (Deleted)
Can you send in an Rx for Ozempic.  We have sent in Trulicity and [place].  I don't see where Ozempic has been sent in. Ozempic would be for diabetes treatment.   ?

## 2022-02-11 ENCOUNTER — Ambulatory Visit: Payer: BC Managed Care – PPO | Admitting: Family Medicine

## 2022-02-21 ENCOUNTER — Telehealth: Payer: Self-pay | Admitting: Pulmonary Disease

## 2022-02-21 ENCOUNTER — Encounter (HOSPITAL_BASED_OUTPATIENT_CLINIC_OR_DEPARTMENT_OTHER): Payer: Self-pay | Admitting: Cardiology

## 2022-02-21 ENCOUNTER — Ambulatory Visit (HOSPITAL_BASED_OUTPATIENT_CLINIC_OR_DEPARTMENT_OTHER): Payer: BC Managed Care – PPO | Admitting: Cardiology

## 2022-02-21 ENCOUNTER — Encounter: Payer: Self-pay | Admitting: Gastroenterology

## 2022-02-21 VITALS — BP 125/68 | HR 58 | Ht 63.0 in | Wt 214.2 lb

## 2022-02-21 DIAGNOSIS — I10 Essential (primary) hypertension: Secondary | ICD-10-CM

## 2022-02-21 DIAGNOSIS — E119 Type 2 diabetes mellitus without complications: Secondary | ICD-10-CM

## 2022-02-21 DIAGNOSIS — I2510 Atherosclerotic heart disease of native coronary artery without angina pectoris: Secondary | ICD-10-CM | POA: Diagnosis not present

## 2022-02-21 DIAGNOSIS — Z7189 Other specified counseling: Secondary | ICD-10-CM

## 2022-02-21 NOTE — Telephone Encounter (Signed)
Insurance is [employer] . Insurance ID number is [id number]. Phone number is [phone]. Patient phone number is [phone]. ?

## 2022-02-21 NOTE — Progress Notes (Signed)
?Cardiology Office Note:   ? ?Date:  02/21/2022  ? ?ID:  [name], DOB [birth year], MRN [id number] ? ?PCP:  [name], MD  ?Cardiologist:  Buford Dresser, MD ? ?Referring MD: [name], MD  ? ?CC: follow up ? ?History of Present Illness:   ? ?Rachel Rivas is a 61 y.o. female with a hx of hypertension, rheumatoid arthritis who is seen for follow up today. I initially met her 11/05/2019 as a new consult at the request of Bedsole, Amy E, MD for the evaluation and management of shortness of breath. ? ?History: Had been undergoing treatment for uterine cancer since our last visit. 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 Did six rounds of adjuvant chemo (carboplatin and taxol), has been exhausted since then. Has struggled with steroids raising her blood sugars. No formal diabetes diagnosis. ? ?Today: ?Overall, she is feeling pretty good with no new complaints. At home her blood pressure has been controlled, she denies any very low blood pressures. ? ?At this time she has completed all of her chemotherapy treatments, and follows up every 6 months. Of note, she reports her sciatic pain was exacerbated by the chemo treatments. This frequently limits her walking and formal exercise. She still walks for exercise, but has also returned to the gym recently. She uses the stationary bike to help with her pain. She endorses some shortness of breath with faster paces of walking. She attributes her lingering SOB to a previous COVID infection. ? ?Yesterday was her second dose of Ozempic. She is not able to tolerate aspirin due to Low platelets (66 most recent labs). Initially she was suffering from muscle cramps on rosuvastatin. Since starting COQ10 she no longer has these muscle cramps. ? ?She denies any palpitations, chest pain, or peripheral edema. No lightheadedness, headaches, syncope, orthopnea, or PND. ? ?For work she commutes to  [place] daily.  ? ? ?Past Medical History:  ?Diagnosis Date  ? Allergy   ? occasionally  ? Anemia   ? yrs ago, low platelets  ? Arthritis   ? RA  ? Back pain   ? Blood transfusion without reported diagnosis   ? 1985 with c section  ? Borderline diabetic   ? Cancer [facility])   ? Dyspnea   ? While taking Megace  ? GERD (gastroesophageal reflux disease)   ? Hypertension   ? Joint pain   ? Obstructive sleep apnea syndrome 01/07/2020  ? Prediabetes   ? Rheumatoid arthritis ([place])   ? Sciatica   ? SOB (shortness of breath)   ? Thrombocytopenia ([place])   ? ? ?Past Surgical History:  ?Procedure Laterality Date  ? CESAREAN SECTION    ? COLONOSCOPY    ? IR IMAGING GUIDED PORT INSERTION  05/22/2020  ? IR REMOVAL TUN ACCESS W/ PORT W/O FL MOD SED  04/30/2021  ? MYOMECTOMY N/A 04/07/2020  ? Procedure: VAGINAL MYOMECTOMY;  Surgeon: Princess Bruins, MD;  Location: [facility];  Service: Gynecology;  Laterality: N/A;  ? POLYPECTOMY    ? ROBOTIC ASSISTED TOTAL HYSTERECTOMY WITH BILATERAL SALPINGO OOPHERECTOMY Bilateral 04/07/2020  ? Procedure: XI ROBOTIC TOTAL LAPAROSCOPIC HYSTERECTOMY, BILATERAL SALPINGO OOPHORECTOMY;  Surgeon: Princess Bruins, MD;  Location: [facility];  Service: Gynecology;  Laterality: Bilateral;  ? STERILIZATION    ? TUBAL LIGATION    ? ? ?Current Medications: ?Current Outpatient Medications on File Prior to Visit  ?Medication Sig  ? albuterol (VENTOLIN HFA) 108 (  90 Base) MCG/ACT inhaler Inhale 2 puffs into the lungs every 6 (six) hours as needed for wheezing or shortness of breath.  ? cyclobenzaprine (FLEXERIL) 10 MG tablet Take 1 tablet (10 mg total) by mouth at bedtime as needed for muscle spasms.  ? famotidine (PEPCID) 20 MG tablet TAKE 1 TABLET BY MOUTH TWICE DAILY FOR HEART  ? ibuprofen (ADVIL) 800 MG tablet Take 1 tablet (800 mg total) by mouth 3 (three) times daily.  ? losartan-hydrochlorothiazide (HYZAAR) 50-12.5 MG tablet Take 1 tablet by mouth daily.  ? Multiple Vitamin  (MULTIVITAMIN ADULT PO) Take by mouth.  ? OZEMPIC, 0.25 OR 0.5 MG/DOSE, 2 MG/1.5ML SOPN Inject 0.25 mg into the skin once a week.  ? rosuvastatin (CRESTOR) 5 MG tablet Take 1 tablet by mouth once daily  ? VITAMIN D PO Take by mouth.  ? ?No current facility-administered medications on file prior to visit.  ?  ? ?Allergies:   Latex, Sulfa antibiotics, and Elemental sulfur  ? ?Social History  ? ?Tobacco Use  ? Smoking status: Former  ?  Packs/day: 0.25  ?  Years: 12.00  ?  Pack years: 3.00  ?  Types: Cigarettes  ? Smokeless tobacco: Never  ?Vaping Use  ? Vaping Use: Never used  ?Substance Use Topics  ? Alcohol use: Yes  ?  Alcohol/week: 0.0 standard drinks  ?  Comment: occasional  ? Drug use: No  ?  Comment: past; marijuana  ? ? ?Family History: ?family history includes Cancer in her mother; Diabetes in her father and mother; Hyperlipidemia in her father; Hypertension in her father and mother; Lung cancer in her mother; Obesity in her father and mother; Stroke in her father; Uterine cancer in her mother. There is no history of Colon cancer, Colon polyps, Esophageal cancer, Rectal cancer, or Stomach cancer. no known MI or CAD. Mother has hypertension, diabetes, cancer. Father had high blood pressure, diabetes, stroke. Both died age 23. Most of her family has lived to very old age. ? ?ROS:   ?Please see the history of present illness.   ?(+) Lower back pain ?(+) Shortness of breath ?Additional pertinent ROS otherwise unremarkable. ? ? ?EKGs/Labs/Other Studies Reviewed:   ? ?The following studies were reviewed today: ? ?CT chest 05/01/20 ?Cardiovascular: Minimal coronary artery calcifications. Normal sized ?heart. ? ?IMPRESSION: ?1. Multiple ill-defined low density liver masses, suspicious for ?metastases. These could be further characterized with pre and ?postcontrast magnetic resonance imaging of the liver. ?2. Moderate diffuse vaginal wall thickening, possibly representing ?edema associated with the patient's recent  surgery. ?3. Small hiatal hernia. ?4. Minimal coronary artery atheromatous calcifications. ?5. Cholelithiasis. ?6. Tiny rounded area of low density in the posterior spleen, too ?small to characterize. ? ?Nuclear stress test 04/08/2013 ?Impression ?Exercise Capacity:  The patient had only fair exercise capacity. ?BP Response:  Normal blood pressure response. ?Clinical Symptoms:  Early in stress the patient had some chest pressure. This persisted into recovery. She was given a nitroglycerin and at that time had decreased blood pressure. She was put in the Trendelenburg position and then stabilized. At the end she had a mild residual headache. ?ECG Impression:  The resting EKG reveals mild nonspecific ST scooping. This becomes somewhat worse with stress but it is not diagnostic. It normalizes early in the recovery period. ?Comparison with Prior Nuclear Study: No images to compare ?  ?Overall Impression:  The patient had a lot of symptoms during the study. She received a nitroglycerin and then had some decreased blood pressure.  The EKGs are nonspecific. The nuclear images are normal. There is no scar or ischemia. This is a low risk scan. ?  ?LV Ejection Fraction: 69%.  LV Wall Motion:  Normal Wall Motion. ? ? ?EKG:  EKG is personally reviewed.   ?02/21/2022: sinus bradycardia 58 bpm ?10/28/2020: NSR, 65 bpm ? ?Recent Labs: ?11/25/2021: Hemoglobin 11.2; Platelets 66 ?01/07/2022: ALT 11; BUN 21; Creatinine, Ser 0.80; Potassium 3.6; Sodium 140  ?Recent Lipid Panel ?   ?Component Value Date/Time  ? CHOL 171 01/07/2022 1010  ? TRIG 76.0 01/07/2022 1010  ? HDL 73.00 01/07/2022 1010  ? CHOLHDL 2 01/07/2022 1010  ? VLDL 15.2 01/07/2022 1010  ? [place] 83 01/07/2022 1010  ? ? ?Physical Exam:   ? ?VS:  BP 125/68   Pulse (!) 58   Ht _0  (1.6 m)   Wt 214 lb 3.2 oz (97.2 kg)   LMP 11/17/2012   SpO2 95%   BMI 37.94 kg/m?    ? ?Wt Readings from Last 3 Encounters:  ?02/21/22 214 lb 3.2 oz (97.2 kg)  ?01/14/22 212 lb 2 oz (96.2 kg)   ?12/30/21 212 lb 6.4 oz (96.3 kg)  ?  ?GEN: Well nourished, well developed in no acute distress ?HEENT: Normal, moist mucous membranes ?NECK: No JVD ?CARDIAC: regular rhythm, normal S1 and S2, no rubs or gallops. No mur

## 2022-02-21 NOTE — Patient Instructions (Signed)
Medication Instructions:  ?Continue current medications ? ?*If you need a refill on your cardiac medications before your next appointment, please call your pharmacy* ? ? ?Lab Work: ?None Ordered ? ? ? ?Testing/Procedures: ?None Ordered ? ? ?Follow-Up: ?At [facility], you and your health needs are our priority.  As part of our continuing mission to provide you with exceptional heart care, we have created designated Provider Care Teams.  These Care Teams include your primary Cardiologist (physician) and Advanced Practice Providers (APPs -  Physician Assistants and Nurse Practitioners) who all work together to provide you with the care you need, when you need it. ? ?We recommend signing up for the patient portal called "MyChart".  Sign up information is provided on this After Visit Summary.  MyChart is used to connect with patients for Virtual Visits (Telemedicine).  Patients are able to view lab/test results, encounter notes, upcoming appointments, etc.  Non-urgent messages can be sent to your provider as well.   ?To learn more about what you can do with MyChart, go to [email or web address].   ? ?Your next appointment:   ?1 year(s) ? ?The format for your next appointment:   ?In Person ? ?Provider:   ?Buford Dresser, MD  ? ? ? ?

## 2022-02-22 NOTE — Telephone Encounter (Signed)
Left VM letting pt know that her DME who is supplying the CPAP will need to contact her insurance to obtain a prior auth for a cpap.   ?

## 2022-03-02 ENCOUNTER — Ambulatory Visit (AMBULATORY_SURGERY_CENTER): Payer: BC Managed Care – PPO | Admitting: *Deleted

## 2022-03-02 VITALS — Ht 63.0 in | Wt 214.0 lb

## 2022-03-02 DIAGNOSIS — Z86010 Personal history of colonic polyps: Secondary | ICD-10-CM

## 2022-03-02 MED ORDER — NA SULFATE-K SULFATE-MG SULF 17.5-3.13-1.6 GM/177ML PO SOLN: 1.0000 | ORAL | 0 refills | Status: DC

## 2022-03-02 NOTE — Progress Notes (Signed)
Patient's pre-visit was done today over the phone with the patient. Name,DOB and address verified. Patient denies any allergies to Eggs and Soy. Patient denies any problems with anesthesia/sedation. Patient is not taking any diet pills or blood thinners. No home Oxygen. Insurance confirmed with patient. ? ?Prep instructions sent to pt's MyChart (if available) & mailed to pt-pt is aware. Patient understands to call us back with any questions or concerns. Patient is aware of our care-partner policy. Suprep used for pt, last colon miralax "adequate prep". ? ?EMMI education assigned to the patient for the procedure, sent to [place].  ? ?The patient is COVID-19 vaccinated.   ?

## 2022-03-11 ENCOUNTER — Encounter: Payer: Self-pay | Admitting: Family Medicine

## 2022-03-11 ENCOUNTER — Ambulatory Visit: Payer: BC Managed Care – PPO | Admitting: Family Medicine

## 2022-03-11 VITALS — BP 124/76 | HR 66 | Temp 98.1°F | Ht 63.0 in | Wt 209.2 lb

## 2022-03-11 DIAGNOSIS — M25512 Pain in left shoulder: Secondary | ICD-10-CM | POA: Diagnosis not present

## 2022-03-11 DIAGNOSIS — S93492A Sprain of other ligament of left ankle, initial encounter: Secondary | ICD-10-CM | POA: Insufficient documentation

## 2022-03-11 DIAGNOSIS — M545 Low back pain, unspecified: Secondary | ICD-10-CM | POA: Diagnosis not present

## 2022-03-11 DIAGNOSIS — W19XXXD Unspecified fall, subsequent encounter: Secondary | ICD-10-CM

## 2022-03-11 DIAGNOSIS — M7062 Trochanteric bursitis, left hip: Secondary | ICD-10-CM | POA: Diagnosis not present

## 2022-03-11 MED ORDER — PREDNISONE 20 MG PO TABS: ORAL_TABLET | ORAL | 0 refills | Status: DC

## 2022-03-11 NOTE — Progress Notes (Signed)
Patient stated that she just had a fall last Thursday at work. Stated she tripped over box in the floor.Marland Kitchen

## 2022-03-11 NOTE — Progress Notes (Signed)
Patient ID: Rachel Rivas, female    DOB: [birth year], 61 y.o.   MRN: [id number]  This visit was conducted in person.  BP 124/76 (BP Location: Right Arm, Patient Position: Sitting, Cuff Size: Large)   Pulse 66   Temp 98.1 F (36.7 C) (Oral)   Ht 5' 3"  (1.6 m)   Wt 209 lb 3.2 oz (94.9 kg)   LMP 11/17/2012   SpO2 (!) 66%   BMI 37.06 kg/m    CC:  Chief Complaint  Patient presents with   Fall    Subjective:   HPI: Rachel Rivas is a 61 y.o. female presenting on 03/11/2022 for Fall   She reports accidental fall when lost balance turning around 1 week ago. Foot got caught on box and she landed on left side.  Hit left side of face left shoulder , elbow and wrist,  left knee, left hip. No LOC. Felt dazed for a few moments. Able to walk  immediately after.   Noted swelling in left knee, sore in low back and left rib cage.   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  Reviewed notes from that MD and X-rays in detail.  Head CT: no acute intracranial findings,  Cervical spine CT:  no acute fracture.  Left shoulder,  wrist, ribs, knee,  lumbar, hip X-ray: No fracture or dislocation.   She has been treated with  muscle relaxant ( methocarbamol).. she could not take given it made her woozy. She was told to limit movement. She has been working from home. Restricted movement until  workers comp follow up next week/ 4/30.  She is still having left lateral hip pain, and low back pain, left shoulder is sore.  Less sore but still uncomfortable in left lateral ankle and knee.   No headache, no confusion. No new neuro change.    Wt Readings from Last 3 Encounters:  03/11/22 209 lb 3.2 oz (94.9 kg)  03/02/22 214 lb (97.1 kg)  02/21/22 214 lb 3.2 oz (97.2 kg)        Relevant past medical, surgical, family and social history reviewed and updated as indicated. Interim medical history since our last visit reviewed. Allergies and medications reviewed and updated. Outpatient Medications Prior  to Visit  Medication Sig Dispense Refill   albuterol (VENTOLIN HFA) 108 (90 Base) MCG/ACT inhaler Inhale 2 puffs into the lungs every 6 (six) hours as needed for wheezing or shortness of breath. 8 g 2   cyclobenzaprine (FLEXERIL) 10 MG tablet Take 1 tablet (10 mg total) by mouth at bedtime as needed for muscle spasms. 15 tablet 0   famotidine (PEPCID) 20 MG tablet TAKE 1 TABLET BY MOUTH TWICE DAILY FOR HEART 180 tablet 1   ibuprofen (ADVIL) 800 MG tablet Take 1 tablet (800 mg total) by mouth 3 (three) times daily. 30 tablet 2   losartan-hydrochlorothiazide (HYZAAR) 50-12.5 MG tablet Take 1 tablet by mouth daily. 90 tablet 3   Multiple Vitamin (MULTIVITAMIN ADULT PO) Take by mouth.     Na Sulfate-K Sulfate-Mg Sulf 17.5-3.13-1.6 GM/177ML SOLN Take 1 kit by mouth as directed. May use generic Suprep 354 mL 0   OZEMPIC, 0.25 OR 0.5 MG/DOSE, 2 MG/1.5ML SOPN Inject 0.25 mg into the skin once a week. 1.5 mL 5   rosuvastatin (CRESTOR) 5 MG tablet Take 1 tablet by mouth once daily 30 tablet 1   VITAMIN D PO Take by mouth.     No facility-administered medications prior to visit.  Per HPI unless specifically indicated in ROS section below Review of Systems  Constitutional:  Negative for fatigue and fever.  HENT:  Negative for congestion.   Eyes:  Negative for pain.  Respiratory:  Negative for cough and shortness of breath.   Cardiovascular:  Negative for chest pain, palpitations and leg swelling.  Gastrointestinal:  Negative for abdominal pain.  Genitourinary:  Negative for dysuria and vaginal bleeding.  Musculoskeletal:  Positive for arthralgias. Negative for back pain.  Neurological:  Negative for syncope, light-headedness and headaches.  Psychiatric/Behavioral:  Negative for dysphoric mood.   Objective:  BP 124/76 (BP Location: Right Arm, Patient Position: Sitting, Cuff Size: Large)   Pulse 66   Temp 98.1 F (36.7 C) (Oral)   Ht 5' 3"  (1.6 m)   Wt 209 lb 3.2 oz (94.9 kg)   LMP 11/17/2012    SpO2 (!) 66%   BMI 37.06 kg/m   Wt Readings from Last 3 Encounters:  03/11/22 209 lb 3.2 oz (94.9 kg)  03/02/22 214 lb (97.1 kg)  02/21/22 214 lb 3.2 oz (97.2 kg)      Physical Exam Constitutional:      General: She is not in acute distress.    Appearance: Normal appearance. She is well-developed. She is not ill-appearing or toxic-appearing.  HENT:     Head: Normocephalic.     Right Ear: Hearing, tympanic membrane, ear canal and external ear normal. Tympanic membrane is not erythematous, retracted or bulging.     Left Ear: Hearing, tympanic membrane, ear canal and external ear normal. Tympanic membrane is not erythematous, retracted or bulging.     Nose: No mucosal edema or rhinorrhea.     Right Sinus: No maxillary sinus tenderness or frontal sinus tenderness.     Left Sinus: No maxillary sinus tenderness or frontal sinus tenderness.     Mouth/Throat:     Pharynx: Uvula midline.  Eyes:     General: Lids are normal. Lids are everted, no foreign bodies appreciated.     Conjunctiva/sclera: Conjunctivae normal.     Pupils: Pupils are equal, round, and reactive to light.  Neck:     Thyroid: No thyroid mass or thyromegaly.     Vascular: No carotid bruit.     Trachea: Trachea normal.  Cardiovascular:     Rate and Rhythm: Normal rate and regular rhythm.     Pulses: Normal pulses.     Heart sounds: Normal heart sounds, S1 normal and S2 normal. No murmur heard.   No friction rub. No gallop.  Pulmonary:     Effort: Pulmonary effort is normal. No tachypnea or respiratory distress.     Breath sounds: Normal breath sounds. No decreased breath sounds, wheezing, rhonchi or rales.  Abdominal:     General: Bowel sounds are normal.     Palpations: Abdomen is soft.     Tenderness: There is no abdominal tenderness.  Musculoskeletal:     Cervical back: Normal range of motion and neck supple.     Left hip: Tenderness present. Decreased range of motion.     Right knee: Normal range of motion.  No tenderness.     Left knee: Normal range of motion. Tenderness present.     Left ankle: Swelling and ecchymosis present. No deformity or lacerations. Tenderness present over the lateral malleolus. No medial malleolus, ATF ligament, AITF ligament, CF ligament, posterior TF ligament, base of 5th metatarsal or proximal fibula tenderness. Decreased range of motion.  Skin:    General: Skin is  warm and dry.     Findings: No rash.  Neurological:     Mental Status: She is alert.  Psychiatric:        Mood and Affect: Mood is not anxious or depressed.        Speech: Speech normal.        Behavior: Behavior normal. Behavior is cooperative.        Thought Content: Thought content normal.        Judgment: Judgment normal.      Results for orders placed or performed in visit on 02/04/22  HM MAMMOGRAPHY  Result Value Ref Range   HM Mammogram 0-4 Bi-Rad 0-4 Bi-Rad, Self Reported Normal    This visit occurred during the SARS-CoV-2 public health emergency.  Safety protocols were in place, including screening questions prior to the visit, additional usage of staff PPE, and extensive cleaning of exam room while observing appropriate contact time as indicated for disinfecting solutions.   COVID 19 screen:  No recent travel or known exposure to COVID19 The patient denies respiratory symptoms of COVID 19 at this time. The importance of social distancing was discussed today.   Assessment and Plan    Problem List Items Addressed This Visit     Accidental fall    No preceding symptoms .  Patient lost balance.       Acute bilateral low back pain without sciatica   RESOLVED: Acute pain of left shoulder   Sprain of anterior talofibular ligament of left ankle - Primary    Acute, improving  Ice, elevate. Start home PT on ankle  and wear  lace up ankle brace.  Can use tylenol for pain.      Trochanteric bursitis of left hip    Acute, poor control.  We will start course of prednisone.  She will  start home PT for her hip as well.  If not improving we will refer her for steroid injection         Eliezer Lofts, MD

## 2022-03-11 NOTE — Patient Instructions (Addendum)
Start home PT on ankle  and wear  lace up ankle brace. ? Can use tylenol for pain. ? Start course of prednisone taper for hip bursitis ? ?Keep follow up workers comp.  ?

## 2022-03-29 ENCOUNTER — Encounter: Payer: Self-pay | Admitting: Family Medicine

## 2022-03-29 NOTE — Telephone Encounter (Signed)
Patient is calling to follow-up on the message below ? ?[facility] is saying we sent over a script for [facility], they told her it has to be checked off as ozempic ? ?Patient would like to pick it up today if possible since she hasn't had it in 3 weeks due to her mix-up in dosage ? ?Please advise ?

## 2022-04-04 ENCOUNTER — Telehealth: Payer: Self-pay | Admitting: Family Medicine

## 2022-04-04 DIAGNOSIS — E1169 Type 2 diabetes mellitus with other specified complication: Secondary | ICD-10-CM

## 2022-04-04 NOTE — Telephone Encounter (Signed)
-----   Message from Velna Hatchet, RT sent at 03/21/2022  9:52 AM EDT ----- ?Regarding: Lab Thu 04/07/22 ?Lab order needed for appt on 04/07/22, please.  Thanks,  Anda Kraft ? ?

## 2022-04-07 ENCOUNTER — Other Ambulatory Visit (INDEPENDENT_AMBULATORY_CARE_PROVIDER_SITE_OTHER): Payer: BC Managed Care – PPO

## 2022-04-07 DIAGNOSIS — E1169 Type 2 diabetes mellitus with other specified complication: Secondary | ICD-10-CM

## 2022-04-07 LAB — COMPREHENSIVE METABOLIC PANEL
ALT: 14 U/L (ref 0–35)
AST: 11 U/L (ref 0–37)
Albumin: 4.5 g/dL (ref 3.5–5.2)
Alkaline Phosphatase: 68 U/L (ref 39–117)
BUN: 27 mg/dL — ABNORMAL HIGH (ref 6–23)
CO2: 31 mEq/L (ref 19–32)
Calcium: 10 mg/dL (ref 8.4–10.5)
Chloride: 101 mEq/L (ref 96–112)
Creatinine, Ser: 0.91 mg/dL (ref 0.40–1.20)
GFR: 67.64 mL/min (ref >60.00)
Glucose, Bld: 103 mg/dL — ABNORMAL HIGH (ref 70–99)
Potassium: 3.8 mEq/L (ref 3.5–5.1)
Sodium: 139 mEq/L (ref 135–145)
Total Bilirubin: 0.4 mg/dL (ref 0.2–1.2)
Total Protein: 7.9 g/dL (ref 6.0–8.3)

## 2022-04-07 LAB — LIPID PANEL
Cholesterol: 148 mg/dL (ref 0–200)
HDL: 83.3 mg/dL (ref >39.00)
LDL Cholesterol: 53 mg/dL (ref 0–99)
NonHDL: 64.98
Total CHOL/HDL Ratio: 2
Triglycerides: 60 mg/dL (ref 0.0–149.0)
VLDL: 12 mg/dL (ref 0.0–40.0)

## 2022-04-07 LAB — HEMOGLOBIN A1C: Hgb A1c MFr Bld: 6.3 % (ref 4.6–6.5)

## 2022-04-07 NOTE — Progress Notes (Signed)
No critical labs need to be addressed urgently. We will discuss labs in detail at upcoming office visit.   

## 2022-04-08 ENCOUNTER — Encounter: Payer: Self-pay | Admitting: Hematology and Oncology

## 2022-04-14 ENCOUNTER — Encounter: Payer: Self-pay | Admitting: Gastroenterology

## 2022-04-14 ENCOUNTER — Ambulatory Visit: Payer: BC Managed Care – PPO | Admitting: Family Medicine

## 2022-04-15 ENCOUNTER — Inpatient Hospital Stay (HOSPITAL_BASED_OUTPATIENT_CLINIC_OR_DEPARTMENT_OTHER): Payer: BC Managed Care – PPO | Admitting: Hematology and Oncology

## 2022-04-15 ENCOUNTER — Ambulatory Visit: Payer: BC Managed Care – PPO | Admitting: Family Medicine

## 2022-04-15 ENCOUNTER — Other Ambulatory Visit: Payer: Self-pay

## 2022-04-15 ENCOUNTER — Encounter: Payer: Self-pay | Admitting: Family Medicine

## 2022-04-15 ENCOUNTER — Inpatient Hospital Stay: Payer: BC Managed Care – PPO | Attending: Hematology and Oncology

## 2022-04-15 ENCOUNTER — Encounter: Payer: Self-pay | Admitting: Hematology and Oncology

## 2022-04-15 VITALS — BP 110/64 | HR 63 | Temp 97.8°F | Ht 63.0 in | Wt 210.2 lb

## 2022-04-15 DIAGNOSIS — Z8542 Personal history of malignant neoplasm of other parts of uterus: Secondary | ICD-10-CM | POA: Diagnosis not present

## 2022-04-15 DIAGNOSIS — C549 Malignant neoplasm of corpus uteri, unspecified: Secondary | ICD-10-CM

## 2022-04-15 DIAGNOSIS — I152 Hypertension secondary to endocrine disorders: Secondary | ICD-10-CM

## 2022-04-15 DIAGNOSIS — Z6836 Body mass index (BMI) 36.0-36.9, adult: Secondary | ICD-10-CM

## 2022-04-15 DIAGNOSIS — D638 Anemia in other chronic diseases classified elsewhere: Secondary | ICD-10-CM | POA: Insufficient documentation

## 2022-04-15 DIAGNOSIS — Z79899 Other long term (current) drug therapy: Secondary | ICD-10-CM | POA: Diagnosis not present

## 2022-04-15 DIAGNOSIS — D693 Immune thrombocytopenic purpura: Secondary | ICD-10-CM | POA: Insufficient documentation

## 2022-04-15 DIAGNOSIS — M069 Rheumatoid arthritis, unspecified: Secondary | ICD-10-CM

## 2022-04-15 DIAGNOSIS — D696 Thrombocytopenia, unspecified: Secondary | ICD-10-CM | POA: Diagnosis not present

## 2022-04-15 DIAGNOSIS — E1159 Type 2 diabetes mellitus with other circulatory complications: Secondary | ICD-10-CM | POA: Diagnosis not present

## 2022-04-15 DIAGNOSIS — E785 Hyperlipidemia, unspecified: Secondary | ICD-10-CM

## 2022-04-15 DIAGNOSIS — I10 Essential (primary) hypertension: Secondary | ICD-10-CM

## 2022-04-15 DIAGNOSIS — E66812 Obesity, class 2: Secondary | ICD-10-CM

## 2022-04-15 DIAGNOSIS — E1169 Type 2 diabetes mellitus with other specified complication: Secondary | ICD-10-CM

## 2022-04-15 LAB — CBC WITH DIFFERENTIAL/PLATELET
Abs Immature Granulocytes: 0.03 10*3/uL (ref 0.00–0.07)
Basophils Absolute: 0 10*3/uL (ref 0.0–0.1)
Basophils Relative: 0 %
Eosinophils Absolute: 0.1 10*3/uL (ref 0.0–0.5)
Eosinophils Relative: 2 %
HCT: 34.7 % — ABNORMAL LOW (ref 36.0–46.0)
Hemoglobin: 11.2 g/dL — ABNORMAL LOW (ref 12.0–15.0)
Immature Granulocytes: 0 %
Lymphocytes Relative: 33 %
Lymphs Abs: 2.4 10*3/uL (ref 0.7–4.0)
MCH: 27 pg (ref 26.0–34.0)
MCHC: 32.3 g/dL (ref 30.0–36.0)
MCV: 83.6 fL (ref 80.0–100.0)
Monocytes Absolute: 0.6 10*3/uL (ref 0.1–1.0)
Monocytes Relative: 9 %
Neutro Abs: 4 10*3/uL (ref 1.7–7.7)
Neutrophils Relative %: 56 %
Platelets: 58 10*3/uL — ABNORMAL LOW (ref 150–400)
RBC: 4.15 MIL/uL (ref 3.87–5.11)
RDW: 15.4 % (ref 11.5–15.5)
WBC: 7.3 10*3/uL (ref 4.0–10.5)
nRBC: 0 % (ref 0.0–0.2)

## 2022-04-15 LAB — COMPREHENSIVE METABOLIC PANEL
ALT: 15 U/L (ref 0–44)
AST: 13 U/L — ABNORMAL LOW (ref 15–41)
Albumin: 4.2 g/dL (ref 3.5–5.0)
Alkaline Phosphatase: 72 U/L (ref 38–126)
Anion gap: 6 (ref 5–15)
BUN: 13 mg/dL (ref 8–23)
CO2: 30 mmol/L (ref 22–32)
Calcium: 9.7 mg/dL (ref 8.9–10.3)
Chloride: 104 mmol/L (ref 98–111)
Creatinine, Ser: 0.87 mg/dL (ref 0.44–1.00)
GFR, Estimated: >60 mL/min (ref >60)
Glucose, Bld: 132 mg/dL — ABNORMAL HIGH (ref 70–99)
Potassium: 3.8 mmol/L (ref 3.5–5.1)
Sodium: 140 mmol/L (ref 135–145)
Total Bilirubin: 0.6 mg/dL (ref 0.3–1.2)
Total Protein: 7.5 g/dL (ref 6.5–8.1)

## 2022-04-15 MED ORDER — OZEMPIC (0.25 OR 0.5 MG/DOSE) 2 MG/3ML ~~LOC~~ SOPN: 0.5000 mg | PEN_INJECTOR | SUBCUTANEOUS | 11 refills | Status: DC

## 2022-04-15 NOTE — Progress Notes (Signed)
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 The patient denies recent history of bleeding such as epistaxis, hematuria or hematochezia. She is asymptomatic from the anemia. We will observe for now.    Class 2 severe obesity due to excess calories with serious comorbidity and body mass index (BMI) of 36.0 to 36.9 in adult ([facility] She is currently attempting weight loss with Ozempic and lifestyle modification I continue to encourage the patient to continue her weight loss efforts  No orders of the defined types were placed in this encounter.   All questions were answered. The patient knows to call the clinic with any problems, questions or concerns. The total time spent in the appointment was 20 minutes encounter with patients including review of chart and various tests results, discussions about plan of care and coordination of care plan   NHeath Lark MD 04/15/2022 8:12 AM  INTERVAL HISTORY: Please see below for problem oriented charting. she returns for surveillance follow-up for history of uterine cancer She is doing well She is taking Ozempic for weight loss Denies abdominal pain, bloating or changes in bowel habits The  patient denies any recent signs or symptoms of bleeding such as spontaneous epistaxis, hematuria or hematochezia.   REVIEW OF SYSTEMS:   Constitutional: Denies fevers, chills or abnormal weight loss Eyes: Denies blurriness of vision Ears, nose, mouth, throat, and face: Denies mucositis or sore throat Respiratory: Denies cough, dyspnea or wheezes Cardiovascular: Denies palpitation, chest discomfort or lower extremity swelling Gastrointestinal:  Denies nausea, heartburn or change in bowel habits Skin: Denies abnormal skin rashes Lymphatics: Denies new lymphadenopathy or easy bruising Neurological:Denies numbness, tingling or new weaknesses Behavioral/Psych: Mood is stable, no new changes  All other systems were reviewed with the patient and are negative.  I have reviewed the past medical history, past surgical history, social history and family history with the patient and they are unchanged from previous note.  ALLERGIES:  is allergic to latex, sulfa antibiotics, and elemental sulfur.  MEDICATIONS:  Current Outpatient Medications  Medication Sig Dispense Refill   albuterol (VENTOLIN HFA) 108 (90 Base) MCG/ACT inhaler Inhale 2 puffs into the lungs every 6 (six) hours as needed for wheezing or shortness of breath. 8 g 2   cyclobenzaprine (FLEXERIL) 10 MG tablet Take 1 tablet (10 mg total) by mouth at bedtime as needed for muscle spasms. 15 tablet 0   famotidine (PEPCID) 20 MG tablet TAKE 1 TABLET BY MOUTH TWICE DAILY FOR HEART 180 tablet 1   losartan-hydrochlorothiazide (HYZAAR) 50-12.5 MG tablet Take 1 tablet by mouth daily. 90 tablet 3   Multiple Vitamin (MULTIVITAMIN ADULT PO) Take by mouth.     Na Sulfate-K Sulfate-Mg Sulf 17.5-3.13-1.6 GM/177ML SOLN Take 1 kit by mouth as directed. May use generic Suprep 354  mL 0   OZEMPIC, 0.25 OR 0.5 MG/DOSE, 2 MG/1.5ML SOPN Inject 0.25 mg into the skin once a week. 1.5 mL 5   rosuvastatin (CRESTOR) 5 MG tablet Take 1 tablet by mouth once daily 30  tablet 1   VITAMIN D PO Take by mouth.     No current facility-administered medications for this visit.    SUMMARY OF ONCOLOGIC HISTORY: Oncology History Overview Note  MSI stable   Adenosarcoma of body of uterus ([place])  03/03/2020 Initial Diagnosis   She developed PMB in early April after going through menopause at approximately age 89. She saw her PCP on 4/13 for bleeding and an ultrasound was obtained showing a thickened endometrial lining and 3-4cm mass within the endocervical canal. She was referred to [facility], started on Megace and ultimately underwent a TRH/BSO on 5/18 for abnormal uterine bleeding with resulting anemia, large prolapsing fibroid.    03/06/2020 Imaging   US pelvis Markedly thickened heterogeneous, and hypervascular endometrial complex up to 38 mm thick highly concerning for endometrial neoplasm.   Additional more focal 3.0 x 2.8 x 5.3 cm diameter mass within endocervical canal, may represent extension of above endometrial pathology versus separate endocervical tumor.   Tissue diagnosis recommended.   Nonvisualization of ovaries.   04/07/2020 Pathology Results   A. UTERUS, CERVIX AND BILATERAL FALLOPIAN TUBES AND OVARIES, HYSTERECTOMY AND BILATERAL SALPINGO-OOPHORECTOMY: - Mullerian adenosarcoma of endometrium. - Tumor limited to the uterus. - Tumor invades to less than half of the myometrium. - No involvement of adnexa. - Margins of resection are not involved. - See oncology table and comment. ONCOLOGY TABLE: UTERUS, SARCOMA: Procedure: Total hysterectomy and bilateral salpingo-oophorectomy Specimen Integrity: The uterus, cervix and bilateral fallopian tubes and ovaries were received intact. The nodule clinically identified as vaginally prolapsed uterine fibroid was received separate and disrupted. Tumor Size: Greatest dimension: 7.5 cm Histologic Type: Mullerian adenosarcoma with overgrowth of the epithelial and stromal components Histologic Grade: The  epithelial component has proliferated to form endometrioid adenocarcinoma which ranges from low grade to high grade. The stromal component focally shows sarcomatous overgrowth. Myometrial Invasion: Present Depth of invasion: 4 mm Myometrial thickness: 30 mm Other Tissue/Organ Involvement: Not identified Margins: Uninvolved by sarcoma Lymphovascular Invasion: Not identified Regional Lymph Nodes: No lymph nodes submitted or found Pathologic Stage Classification (pTNM, AJCC 8th Edition): pT1b, pNX Additional Pathologic Findings: Adenomyosis. Leiomyomata.   04/07/2020 Surgery   PRE-OPERATIVE DIAGNOSIS:  Large prolapsed fibroid vaginally, menometrorrhagia with anemia   POST-OPERATIVE DIAGNOSIS:  Large prolapsed fibroid vaginally, menometrorrhagia with anemia.  Omental adhesions with anterior abdominal wall.   PROCEDURE:  Procedure(s): XI ROBOTIC TOTAL LAPAROSCOPIC HYSTERECTOMY, BILATERAL SALPINGO OOPHORECTOMY/LYSIS OF ADHESIONS/VAGINAL MYOMECTOMY    FINDINGS: Large necrotic vaginally prolapsed uterine myoma.  Uterus with small subserosal/intramural myoma.  Bilateral tubes post tubal ligation.  Bilateral normal ovaries.  Omental adhesions with anterior abdominal wall.   05/01/2020 Imaging   Ct scan of chest, abdomen and pelvis 1. Multiple ill-defined low density liver masses, suspicious for metastases. These could be further characterized with pre and postcontrast magnetic resonance imaging of the liver. 2. Moderate diffuse vaginal wall thickening, possibly representing edema associated with the patient's recent surgery. 3. Small hiatal hernia. 4. Minimal coronary artery atheromatous calcifications. 5. Cholelithiasis. 6. Tiny rounded area of low density in the posterior spleen, too small to characterize.   05/11/2020 Cancer Staging   Staging form: Corpus Uteri - Adenosarcoma, AJCC 8th Edition - Pathologic: Stage IB (pT1b, pN0, cM0) - Signed by Heath Lark, MD on  05/15/2020    05/14/2020  Imaging   MRI liver 1. The 2 dominant right hepatic lobe lesions have MR imaging features most consistent with benign cavernous hemangioma. The remaining tiny liver lesions are too small to characterize. Statistically, while these are likely benign, follow-up MRI in 3 months recommended to ensure stability. 2. No other findings to suggest metastatic disease.   05/22/2020 Procedure   Placement of a subcutaneous port device. Catheter tip at the superior cavoatrial junction   05/29/2020 - 09/11/2020 Chemotherapy   The patient had carboplatin and taxol for chemotherapy treatment.     07/22/2020 Imaging   1. Status post hysterectomy and bilateral oophorectomy. Irregular soft tissue density in the region of the vaginal cuff is relatively similar to on the prior exam. Although this could represent developing scar or chronic hematoma, residual disease cannot be excluded. This could either be re-evaluated at follow-up or if a more aggressive approach is desired, characterized with PET. 2. No other evidence of metastatic disease in the abdomen or pelvis. 3. Hepatic hemangiomas, as before. Smaller liver lesions are unchanged, favoring a benign etiology. 4. Cholelithiasis. 5. Tiny hiatal hernia. 6. Aortic Atherosclerosis (ICD10-I70.0).   10/12/2020 Imaging   1. Stable appearance of the vaginal cuff status post hysterectomy. Soft tissue fullness appears unchanged, likely postsurgical in etiology. Correlate with physical examination and tumor markers. 2. No evidence of metastatic disease. 3. Stable hepatic hemangiomas. 4. Cholelithiasis without evidence of cholecystitis or biliary dilatation. 5. Aortic Atherosclerosis (ICD10-I70.0).   04/12/2021 Imaging   1. Status post hysterectomy. Unchanged appearance of somewhat nodular soft tissue at the left and right aspects of the vaginal cuff, again favored to represent postoperative change without specific findings or evidence of progression to suggest local  malignant recurrence. 2. No noncontrast evidence of metastatic disease in the abdomen or pelvis. 3. Cholelithiasis.   Aortic Atherosclerosis (ICD10-I70.0).   04/30/2021 Procedure   Removal of implanted Port-A-Cath utilizing sharp and blunt dissection. The procedure was uncomplicated.     PHYSICAL EXAMINATION: ECOG PERFORMANCE STATUS: 0 - Asymptomatic  Vitals:   04/15/22 0756  BP: 129/73  Pulse: 60  Resp: 18  Temp: 98 F (36.7 C)  SpO2: 99%   Filed Weights   04/15/22 0756  Weight: 210 lb 12.8 oz (95.6 kg)    GENERAL:alert, no distress and comfortable SKIN: skin color, texture, turgor are normal, no rashes or significant lesions EYES: normal, Conjunctiva are pink and non-injected, sclera clear OROPHARYNX:no exudate, no erythema and lips, buccal mucosa, and tongue normal  NECK: supple, thyroid normal size, non-tender, without nodularity LYMPH:  no palpable lymphadenopathy in the cervical, axillary or inguinal LUNGS: clear to auscultation and percussion with normal breathing effort HEART: regular rate & rhythm and no murmurs and no lower extremity edema ABDOMEN:abdomen soft, non-tender and normal bowel sounds Musculoskeletal:no cyanosis of digits and no clubbing  NEURO: alert & oriented x 3 with fluent speech, no focal motor/sensory deficits  LABORATORY DATA:  I have reviewed the data as listed    Component Value Date/Time   NA 139 04/07/2022 0743   NA 142 01/28/2021 0922   NA 142 04/07/2017 0810   K 3.8 04/07/2022 0743   K 3.8 04/07/2017 0810   CL 101 04/07/2022 0743   CO2 31 04/07/2022 0743   CO2 28 04/07/2017 0810   GLUCOSE 103 (H) 04/07/2022 0743   GLUCOSE 100 04/07/2017 0810   BUN 27 (H) 04/07/2022 0743   BUN 17 01/28/2021 0922   BUN 16.8 04/07/2017 0810  CREATININE 0.91 04/07/2022 0743   CREATININE 0.78 04/12/2021 1301   CREATININE 0.9 04/07/2017 0810   CALCIUM 10.0 04/07/2022 0743   CALCIUM 9.9 04/07/2017 0810   PROT 7.9 04/07/2022 0743   PROT 7.6  01/28/2021 0922   PROT 8.0 04/07/2017 0810   ALBUMIN 4.5 04/07/2022 0743   ALBUMIN 4.8 01/28/2021 0922   ALBUMIN 4.4 04/07/2017 0810   AST 11 04/07/2022 0743   AST 15 04/12/2021 1301   AST 18 04/07/2017 0810   ALT 14 04/07/2022 0743   ALT 16 04/12/2021 1301   ALT 23 04/07/2017 0810   ALKPHOS 68 04/07/2022 0743   ALKPHOS 79 04/07/2017 0810   BILITOT 0.4 04/07/2022 0743   BILITOT 0.5 04/12/2021 1301   BILITOT 0.70 04/07/2017 0810   GFRNONAA >60 11/25/2021 0803   GFRNONAA >60 04/12/2021 1301   GFRAA >60 08/21/2020 0805    No results found for: SPEP, UPEP  Lab Results  Component Value Date   WBC 7.3 04/15/2022   NEUTROABS 4.0 04/15/2022   HGB 11.2 (L) 04/15/2022   HCT 34.7 (L) 04/15/2022   MCV 83.6 04/15/2022   PLT 58 (L) 04/15/2022      Chemistry      Component Value Date/Time   NA 139 04/07/2022 0743   NA 142 01/28/2021 0922   NA 142 04/07/2017 0810   K 3.8 04/07/2022 0743   K 3.8 04/07/2017 0810   CL 101 04/07/2022 0743   CO2 31 04/07/2022 0743   CO2 28 04/07/2017 0810   BUN 27 (H) 04/07/2022 0743   BUN 17 01/28/2021 0922   BUN 16.8 04/07/2017 0810   CREATININE 0.91 04/07/2022 0743   CREATININE 0.78 04/12/2021 1301   CREATININE 0.9 04/07/2017 0810      Component Value Date/Time   CALCIUM 10.0 04/07/2022 0743   CALCIUM 9.9 04/07/2017 0810   ALKPHOS 68 04/07/2022 0743   ALKPHOS 79 04/07/2017 0810   AST 11 04/07/2022 0743   AST 15 04/12/2021 1301   AST 18 04/07/2017 0810   ALT 14 04/07/2022 0743   ALT 16 04/12/2021 1301   ALT 23 04/07/2017 0810   BILITOT 0.4 04/07/2022 0743   BILITOT 0.5 04/12/2021 1301   BILITOT 0.70 04/07/2017 0810

## 2022-04-15 NOTE — Assessment & Plan Note (Signed)
Excellent control on Ozempic.

## 2022-04-15 NOTE — Assessment & Plan Note (Signed)
This is likely anemia of chronic disease likely related to her autoimmune disease such as rheumatoid arthritis. The patient denies recent history of bleeding such as epistaxis, hematuria or hematochezia. She is asymptomatic from the anemia. We will observe for now.

## 2022-04-15 NOTE — Assessment & Plan Note (Signed)
Chronic, stable control followed by rheumatology 

## 2022-04-15 NOTE — Assessment & Plan Note (Addendum)
Her chronic ITP is stable She does not need transfusion support or additional treatment for chronic ITP I will continue close observation I will see her again in 6 months for follow-up

## 2022-04-15 NOTE — Assessment & Plan Note (Signed)
She is currently attempting weight loss with Ozempic and lifestyle modification I continue to encourage the patient to continue her weight loss efforts

## 2022-04-15 NOTE — Assessment & Plan Note (Signed)
Clinically, she has no signs of residual disease I recommend follow-up with gynecologist in 62month She is educated to watch out for signs and symptoms of cancer recurrence

## 2022-04-15 NOTE — Patient Instructions (Addendum)
Increase semaglutide to 0.5 mg weekly.  Increase flonase  to 2 sprays per nostril daily.  Start Cerumenex wax drops to soften ear wax.  Call for re-evaluation if ear issue.

## 2022-04-15 NOTE — Assessment & Plan Note (Signed)
Chronic, well controlled  Continue losartan/hydrochlorothiazide 50/12.5 mg p.o. daily. 

## 2022-04-15 NOTE — Progress Notes (Signed)
Patient ID: Rachel Rivas, female    DOB: May 14, 1961, 61 y.o.   MRN: [id number]  This visit was conducted in person.  BP 110/64   Pulse 63   Temp 97.8 F (36.6 C) (Oral)   Ht 5' 3" (1.6 m)   Wt 210 lb 4 oz (95.4 kg)   LMP 11/17/2012   SpO2 97%   BMI 37.24 kg/m    CC:  Chief Complaint  Patient presents with   Follow-up    DM and CHOL    Subjective:   HPI: Rachel Rivas is a 61 y.o. female presenting on 04/15/2022 for Follow-up (DM and CHOL)  Reviewed labs in detail  Hypertension continues to be well controlled on losartan hydrochlorothiazide 50/12.5 mg p.o. daily.  No side effects.  She denies chest pain shortness of breath and peripheral swelling  Diabetes:   She has been using Ozempic in last several months, she has missed several doses.  She has noted she is more thirsty and increased urination.  She has noted some mild decreased appetite. Lab Results  Component Value Date   HGBA1C 6.3 04/07/2022  Using medications without difficulties: Hypoglycemic episodes: Hyperglycemic episodes: Feet problems: None Blood Sugars averaging:not checking  eye exam within last year:  uptodate  Elevated Cholesterol: Excellent control with LDL at goal on Crestor 5 mg daily. Lab Results  Component Value Date   CHOL 148 04/07/2022   HDL 83.30 04/07/2022   LDLCALC 53 04/07/2022   TRIG 60.0 04/07/2022   CHOLHDL 2 04/07/2022  Using medications without problems: None Muscle aches: None.. using coenzyme Q10  Diet compliance: heart healthy Exercise: walking Other complaints:  Obesity with multiple comorbidities: BMI 37 She started Ozempic 0.25 mg weekly on Wt Readings from Last 3 Encounters:  04/15/22 210 lb 4 oz (95.4 kg)  04/15/22 210 lb 12.8 oz (95.6 kg)  03/11/22 209 lb 3.2 oz (94.9 kg)        Relevant past medical, surgical, family and social history reviewed and updated as indicated. Interim medical history since our last visit reviewed. Allergies and medications  reviewed and updated. Outpatient Medications Prior to Visit  Medication Sig Dispense Refill   albuterol (VENTOLIN HFA) 108 (90 Base) MCG/ACT inhaler Inhale 2 puffs into the lungs every 6 (six) hours as needed for wheezing or shortness of breath. 8 g 2   Coenzyme Q10 (COQ10 PO) Take 2 each by mouth daily.     cyclobenzaprine (FLEXERIL) 10 MG tablet Take 1 tablet (10 mg total) by mouth at bedtime as needed for muscle spasms. 15 tablet 0   diclofenac Sodium (VOLTAREN) 1 % GEL Apply topically.     famotidine (PEPCID) 20 MG tablet TAKE 1 TABLET BY MOUTH TWICE DAILY FOR HEART 180 tablet 1   losartan-hydrochlorothiazide (HYZAAR) 50-12.5 MG tablet Take 1 tablet by mouth daily. 90 tablet 3   Multiple Vitamin (MULTIVITAMIN ADULT PO) Take by mouth.     OZEMPIC, 0.25 OR 0.5 MG/DOSE, 2 MG/1.5ML SOPN Inject 0.25 mg into the skin once a week. 1.5 mL 5   rosuvastatin (CRESTOR) 5 MG tablet Take 1 tablet by mouth once daily 30 tablet 1   VITAMIN D PO Take by mouth.     Na Sulfate-K Sulfate-Mg Sulf 17.5-3.13-1.6 GM/177ML SOLN Take 1 kit by mouth as directed. May use generic Suprep 354 mL 0   No facility-administered medications prior to visit.     Per HPI unless specifically indicated in ROS section below Review of Systems  Constitutional:  Negative for fatigue and fever.  HENT:  Negative for congestion.   Eyes:  Negative for pain.  Respiratory:  Negative for cough and shortness of breath.   Cardiovascular:  Negative for chest pain, palpitations and leg swelling.  Gastrointestinal:  Negative for abdominal pain.  Genitourinary:  Negative for dysuria and vaginal bleeding.  Musculoskeletal:  Negative for back pain.  Neurological:  Negative for syncope, light-headedness and headaches.  Psychiatric/Behavioral:  Negative for dysphoric mood.   Objective:  BP 110/64   Pulse 63   Temp 97.8 F (36.6 C) (Oral)   Ht 5' 3" (1.6 m)   Wt 210 lb 4 oz (95.4 kg)   LMP 11/17/2012   SpO2 97%   BMI 37.24 kg/m   Wt  Readings from Last 3 Encounters:  04/15/22 210 lb 4 oz (95.4 kg)  04/15/22 210 lb 12.8 oz (95.6 kg)  03/11/22 209 lb 3.2 oz (94.9 kg)      Physical Exam Constitutional:      General: She is not in acute distress.    Appearance: Normal appearance. She is well-developed. She is not ill-appearing or toxic-appearing.  HENT:     Head: Normocephalic.     Right Ear: Hearing, tympanic membrane, ear canal and external ear normal. Tympanic membrane is not erythematous, retracted or bulging.     Left Ear: Hearing, tympanic membrane, ear canal and external ear normal. Tympanic membrane is not erythematous, retracted or bulging.     Nose: No mucosal edema or rhinorrhea.     Right Sinus: No maxillary sinus tenderness or frontal sinus tenderness.     Left Sinus: No maxillary sinus tenderness or frontal sinus tenderness.     Mouth/Throat:     Pharynx: Uvula midline.  Eyes:     General: Lids are normal. Lids are everted, no foreign bodies appreciated.     Conjunctiva/sclera: Conjunctivae normal.     Pupils: Pupils are equal, round, and reactive to light.  Neck:     Thyroid: No thyroid mass or thyromegaly.     Vascular: No carotid bruit.     Trachea: Trachea normal.  Cardiovascular:     Rate and Rhythm: Normal rate and regular rhythm.     Pulses: Normal pulses.     Heart sounds: Normal heart sounds, S1 normal and S2 normal. No murmur heard.   No friction rub. No gallop.  Pulmonary:     Effort: Pulmonary effort is normal. No tachypnea or respiratory distress.     Breath sounds: Normal breath sounds. No decreased breath sounds, wheezing, rhonchi or rales.  Abdominal:     General: Bowel sounds are normal.     Palpations: Abdomen is soft.     Tenderness: There is no abdominal tenderness.  Musculoskeletal:     Cervical back: Normal range of motion and neck supple.  Skin:    General: Skin is warm and dry.     Findings: No rash.  Neurological:     Mental Status: She is alert.  Psychiatric:         Mood and Affect: Mood is not anxious or depressed.        Speech: Speech normal.        Behavior: Behavior normal. Behavior is cooperative.        Thought Content: Thought content normal.        Judgment: Judgment normal.      Results for orders placed or performed in visit on 04/15/22  Comprehensive metabolic panel  Result Value Ref  Range   Sodium 140 135 - 145 mmol/L   Potassium 3.8 3.5 - 5.1 mmol/L   Chloride 104 98 - 111 mmol/L   CO2 30 22 - 32 mmol/L   Glucose, Bld 132 (H) 70 - 99 mg/dL   BUN 13 8 - 23 mg/dL   Creatinine, Ser 0.87 0.44 - 1.00 mg/dL   Calcium 9.7 8.9 - 10.3 mg/dL   Total Protein 7.5 6.5 - 8.1 g/dL   Albumin 4.2 3.5 - 5.0 g/dL   AST 13 (L) 15 - 41 U/L   ALT 15 0 - 44 U/L   Alkaline Phosphatase 72 38 - 126 U/L   Total Bilirubin 0.6 0.3 - 1.2 mg/dL   GFR, Estimated >60 >60 mL/min   Anion gap 6 5 - 15  CBC with Differential/Platelet  Result Value Ref Range   WBC 7.3 4.0 - 10.5 K/uL   RBC 4.15 3.87 - 5.11 MIL/uL   Hemoglobin 11.2 (L) 12.0 - 15.0 g/dL   HCT 34.7 (L) 36.0 - 46.0 %   MCV 83.6 80.0 - 100.0 fL   MCH 27.0 26.0 - 34.0 pg   MCHC 32.3 30.0 - 36.0 g/dL   RDW 15.4 11.5 - 15.5 %   Platelets 58 (L) 150 - 400 K/uL   nRBC 0.0 0.0 - 0.2 %   Neutrophils Relative % 56 %   Neutro Abs 4.0 1.7 - 7.7 K/uL   Lymphocytes Relative 33 %   Lymphs Abs 2.4 0.7 - 4.0 K/uL   Monocytes Relative 9 %   Monocytes Absolute 0.6 0.1 - 1.0 K/uL   Eosinophils Relative 2 %   Eosinophils Absolute 0.1 0.0 - 0.5 K/uL   Basophils Relative 0 %   Basophils Absolute 0.0 0.0 - 0.1 K/uL   Immature Granulocytes 0 %   Abs Immature Granulocytes 0.03 0.00 - 0.07 K/uL     COVID 19 screen:  No recent travel or known exposure to COVID19 The patient denies respiratory symptoms of COVID 19 at this time. The importance of social distancing was discussed today.   Assessment and Plan    Problem List Items Addressed This Visit     Class 2 severe obesity due to excess calories with  serious comorbidity and body mass index (BMI) of 36.0 to 36.9 in adult [facility]) (Chronic)    Chronic, no improvement on starting dose of Ozempic.  We will increase to 0.5 mg weekly Ozempic.  She will follow-up in 1 month for reevaluation.       Relevant Medications   Semaglutide,0.25 or 0.5MG/DOS, (OZEMPIC, 0.25 OR 0.5 MG/DOSE,) 2 MG/3ML SOPN   Rheumatoid arthritis (HCC) (Chronic)    Chronic, stable control followed by rheumatology       Hyperlipidemia associated with type 2 diabetes mellitus (HCC)    Chronic, excellent control with LDL at goal on Crestor 5 mg p.o. daily       Relevant Medications   Semaglutide,0.25 or 0.5MG/DOS, (OZEMPIC, 0.25 OR 0.5 MG/DOSE,) 2 MG/3ML SOPN   Hypertension associated with type 2 diabetes mellitus (HCC)    Chronic, well controlled  Continue losartan/hydrochlorothiazide 50/12.5 mg p.o. daily.       Relevant Medications   Semaglutide,0.25 or 0.5MG/DOS, (OZEMPIC, 0.25 OR 0.5 MG/DOSE,) 2 MG/3ML SOPN   Type 2 diabetes mellitus with other circulatory complications ( HTN) (HCC)    Excellent control on Ozempic.       Relevant Medications   Semaglutide,0.25 or 0.5MG/DOS, (OZEMPIC, 0.25 OR 0.5 MG/DOSE,) 2 MG/3ML SOPN  Other Visit Diagnoses     Type 2 diabetes mellitus with other specified complication, without long-term current use of insulin ([place])    -  Primary   Relevant Medications   Semaglutide,0.25 or 0.5MG/DOS, (OZEMPIC, 0.25 OR 0.5 MG/DOSE,) 2 MG/3ML SOPN      Meds ordered this encounter  Medications   Semaglutide,0.25 or 0.5MG/DOS, (OZEMPIC, 0.25 OR 0.5 MG/DOSE,) 2 MG/3ML SOPN    Sig: Inject 0.5 mg into the skin once a week.    Dispense:  3 mL    Refill:  11     Eliezer Lofts, MD

## 2022-04-15 NOTE — Assessment & Plan Note (Signed)
Chronic, excellent control with LDL at goal on Crestor 5 mg p.o. daily 

## 2022-04-15 NOTE — Assessment & Plan Note (Signed)
Chronic, no improvement on starting dose of Ozempic.  We will increase to 0.5 mg weekly Ozempic.  She will follow-up in 1 month for reevaluation.

## 2022-04-16 DIAGNOSIS — W19XXXA Unspecified fall, initial encounter: Secondary | ICD-10-CM | POA: Insufficient documentation

## 2022-04-16 NOTE — Assessment & Plan Note (Signed)
No preceding symptoms .  Patient lost balance.

## 2022-04-16 NOTE — Assessment & Plan Note (Signed)
Acute, poor control.  We will start course of prednisone.  She will start home PT for her hip as well.  If not improving we will refer her for steroid injection

## 2022-04-16 NOTE — Assessment & Plan Note (Signed)
Acute, improving  Ice, elevate. Start home PT on ankle  and wear  lace up ankle brace.  Can use tylenol for pain.

## 2022-04-17 ENCOUNTER — Encounter: Payer: Self-pay | Admitting: Gastroenterology

## 2022-04-19 ENCOUNTER — Encounter: Payer: BC Managed Care – PPO | Admitting: Gastroenterology

## 2022-04-19 ENCOUNTER — Telehealth: Payer: Self-pay | Admitting: Gastroenterology

## 2022-04-19 NOTE — Telephone Encounter (Signed)
Thanks. She called in to the on-call physician over the weekend, she got sick and can't come in today. She will need to be rescheduled. I forwarded the staff  message to [facility] admitting so they should be aware. Thanks

## 2022-04-19 NOTE — Telephone Encounter (Signed)
Good Morning Dr. Havery Moros,  I tried calling this patient this morning however no answered I left a message to call back to reschedule.    Please see note from 04/17/22

## 2022-04-19 NOTE — Telephone Encounter (Signed)
Patient's appt was rescheduled this morning by office staff. Her colonoscopy appt is now on Tuesday, 06/07/22 at 11 am.

## 2022-04-21 ENCOUNTER — Telehealth: Payer: Self-pay | Admitting: *Deleted

## 2022-04-21 NOTE — Telephone Encounter (Signed)
Spoke with the patient and scheduled a follow up appt with Dr Delsa Sale on 8/9 t 845am

## 2022-04-26 ENCOUNTER — Other Ambulatory Visit: Payer: Self-pay | Admitting: Cardiology

## 2022-04-26 DIAGNOSIS — I2510 Atherosclerotic heart disease of native coronary artery without angina pectoris: Secondary | ICD-10-CM

## 2022-04-26 NOTE — Telephone Encounter (Signed)
Rx request sent to pharmacy.  

## 2022-04-29 ENCOUNTER — Ambulatory Visit: Payer: BC Managed Care – PPO | Admitting: Family Medicine

## 2022-04-29 VITALS — BP 128/72 | HR 72 | Temp 97.6°F | Resp 12 | Ht 63.0 in | Wt 211.0 lb

## 2022-04-29 DIAGNOSIS — J4521 Mild intermittent asthma with (acute) exacerbation: Secondary | ICD-10-CM | POA: Diagnosis not present

## 2022-04-29 MED ORDER — PREDNISONE 10 MG PO TABS: ORAL_TABLET | ORAL | 0 refills | Status: DC

## 2022-04-29 MED ORDER — AZITHROMYCIN 250 MG PO TABS: ORAL_TABLET | ORAL | 0 refills | Status: DC

## 2022-04-29 NOTE — Progress Notes (Signed)
Patient ID: Rachel Rivas, female    DOB: [birth year], 61 y.o.   MRN: [id number]  This visit was conducted in person.  BP 128/72   Pulse 72   Temp 97.6 F (36.4 C)   Resp 12   Ht '5\' 3"'$  (1.6 m)   Wt 211 lb (95.7 kg)   LMP 11/17/2012   SpO2 99%   BMI 37.38 kg/m    CC:  Chief Complaint  Patient presents with   Cough    X 2 weeks. No fever. Runny nose, coughing up clear phlegm.    Subjective:   HPI: Rachel Rivas is a 61 y.o. female  with history of DM and HTN presenting on 04/29/2022 for Cough (X 2 weeks. No fever. Runny nose, coughing up clear phlegm.)  Date of onset: 2 weeks ago  Initial symptoms began with  cough, productive and nasal congestion.  Some SOB and wheeze intermittently. Some chest tightness of and on.   No fever.  No body aches, no fatigue. left ear pain now resolved, no ST. Cough not keeping her up at night.  She has tried to treat with  Nyquil, mucinex, albuterol inhaler off and of.   Former remote smoker.. no history of COPD,  some asthma.. mainly when gets sick.  Only sick contact was doctor visit prior to symptoms.   No COVID test.     Relevant past medical, surgical, family and social history reviewed and updated as indicated. Interim medical history since our last visit reviewed. Allergies and medications reviewed and updated. Outpatient Medications Prior to Visit  Medication Sig Dispense Refill   albuterol (VENTOLIN HFA) 108 (90 Base) MCG/ACT inhaler Inhale 2 puffs into the lungs every 6 (six) hours as needed for wheezing or shortness of breath. 8 g 2   Coenzyme Q10 (COQ10 PO) Take 2 each by mouth daily.     cyclobenzaprine (FLEXERIL) 10 MG tablet Take 1 tablet (10 mg total) by mouth at bedtime as needed for muscle spasms. 15 tablet 0   diclofenac Sodium (VOLTAREN) 1 % GEL Apply topically.     famotidine (PEPCID) 20 MG tablet TAKE 1 TABLET BY MOUTH TWICE DAILY FOR HEART 180 tablet 1   losartan-hydrochlorothiazide (HYZAAR) 50-12.5 MG tablet  Take 1 tablet by mouth daily. 90 tablet 3   Multiple Vitamin (MULTIVITAMIN ADULT PO) Take by mouth.     rosuvastatin (CRESTOR) 5 MG tablet Take 1 tablet (5 mg total) by mouth daily. 30 tablet 6   Semaglutide,0.25 or 0.'5MG'$ /DOS, (OZEMPIC, 0.25 OR 0.5 MG/DOSE,) 2 MG/3ML SOPN Inject 0.5 mg into the skin once a week. 3 mL 11   VITAMIN D PO Take by mouth.     meloxicam (MOBIC) 15 MG tablet Take 15 mg by mouth daily. (Patient not taking: Reported on 04/29/2022)     No facility-administered medications prior to visit.     Per HPI unless specifically indicated in ROS section below Review of Systems  Constitutional:  Negative for fatigue and fever.  HENT:  Positive for congestion.   Eyes:  Negative for pain.  Respiratory:  Positive for cough, chest tightness, shortness of breath and wheezing.   Cardiovascular:  Negative for chest pain, palpitations and leg swelling.  Gastrointestinal:  Negative for abdominal pain.  Genitourinary:  Negative for dysuria and vaginal bleeding.  Musculoskeletal:  Negative for back pain.  Neurological:  Negative for syncope, light-headedness and headaches.  Psychiatric/Behavioral:  Negative for dysphoric mood.    Objective:  BP 128/72   Pulse  72   Temp 97.6 F (36.4 C)   Resp 12   Ht '5\' 3"'$  (1.6 m)   Wt 211 lb (95.7 kg)   LMP 11/17/2012   SpO2 99%   BMI 37.38 kg/m   Wt Readings from Last 3 Encounters:  04/29/22 211 lb (95.7 kg)  04/15/22 210 lb 4 oz (95.4 kg)  04/15/22 210 lb 12.8 oz (95.6 kg)      Physical Exam Constitutional:      General: She is not in acute distress.    Appearance: Normal appearance. She is well-developed. She is not ill-appearing or toxic-appearing.  HENT:     Head: Normocephalic.     Right Ear: Hearing, tympanic membrane, ear canal and external ear normal. Tympanic membrane is not erythematous, retracted or bulging.     Left Ear: Hearing, tympanic membrane, ear canal and external ear normal. Tympanic membrane is not erythematous,  retracted or bulging.     Nose: No mucosal edema or rhinorrhea.     Right Sinus: No maxillary sinus tenderness or frontal sinus tenderness.     Left Sinus: No maxillary sinus tenderness or frontal sinus tenderness.     Mouth/Throat:     Pharynx: Uvula midline.  Eyes:     General: Lids are normal. Lids are everted, no foreign bodies appreciated.     Conjunctiva/sclera: Conjunctivae normal.     Pupils: Pupils are equal, round, and reactive to light.  Neck:     Thyroid: No thyroid mass or thyromegaly.     Vascular: No carotid bruit.     Trachea: Trachea normal.  Cardiovascular:     Rate and Rhythm: Normal rate and regular rhythm.     Pulses: Normal pulses.     Heart sounds: Normal heart sounds, S1 normal and S2 normal. No murmur heard.    No friction rub. No gallop.  Pulmonary:     Effort: Pulmonary effort is normal. No tachypnea or respiratory distress.     Breath sounds: Normal breath sounds. No decreased breath sounds, wheezing, rhonchi or rales.  Abdominal:     General: Bowel sounds are normal.     Palpations: Abdomen is soft.     Tenderness: There is no abdominal tenderness.  Musculoskeletal:     Cervical back: Normal range of motion and neck supple.  Skin:    General: Skin is warm and dry.     Findings: No rash.  Neurological:     Mental Status: She is alert.  Psychiatric:        Mood and Affect: Mood is not anxious or depressed.        Speech: Speech normal.        Behavior: Behavior normal. Behavior is cooperative.        Thought Content: Thought content normal.        Judgment: Judgment normal.       Results for orders placed or performed in visit on 04/15/22  Comprehensive metabolic panel  Result Value Ref Range   Sodium 140 135 - 145 mmol/L   Potassium 3.8 3.5 - 5.1 mmol/L   Chloride 104 98 - 111 mmol/L   CO2 30 22 - 32 mmol/L   Glucose, Bld 132 (H) 70 - 99 mg/dL   BUN 13 8 - 23 mg/dL   Creatinine, Ser 0.87 0.44 - 1.00 mg/dL   Calcium 9.7 8.9 - 10.3 mg/dL    Total Protein 7.5 6.5 - 8.1 g/dL   Albumin 4.2 3.5 - 5.0 g/dL  AST 13 (L) 15 - 41 U/L   ALT 15 0 - 44 U/L   Alkaline Phosphatase 72 38 - 126 U/L   Total Bilirubin 0.6 0.3 - 1.2 mg/dL   GFR, Estimated >60 >60 mL/min   Anion gap 6 5 - 15  CBC with Differential/Platelet  Result Value Ref Range   WBC 7.3 4.0 - 10.5 K/uL   RBC 4.15 3.87 - 5.11 MIL/uL   Hemoglobin 11.2 (L) 12.0 - 15.0 g/dL   HCT 34.7 (L) 36.0 - 46.0 %   MCV 83.6 80.0 - 100.0 fL   MCH 27.0 26.0 - 34.0 pg   MCHC 32.3 30.0 - 36.0 g/dL   RDW 15.4 11.5 - 15.5 %   Platelets 58 (L) 150 - 400 K/uL   nRBC 0.0 0.0 - 0.2 %   Neutrophils Relative % 56 %   Neutro Abs 4.0 1.7 - 7.7 K/uL   Lymphocytes Relative 33 %   Lymphs Abs 2.4 0.7 - 4.0 K/uL   Monocytes Relative 9 %   Monocytes Absolute 0.6 0.1 - 1.0 K/uL   Eosinophils Relative 2 %   Eosinophils Absolute 0.1 0.0 - 0.5 K/uL   Basophils Relative 0 %   Basophils Absolute 0.0 0.0 - 0.1 K/uL   Immature Granulocytes 0 %   Abs Immature Granulocytes 0.03 0.00 - 0.07 K/uL     COVID 19 screen:  No recent travel or known exposure to COVID19 The patient denies respiratory symptoms of COVID 19 at this time. The importance of social distancing was discussed today.   Assessment and Plan    Problem List Items Addressed This Visit     Mild intermittent asthma with exacerbation - Primary    Acute, given greater than 2 weeks of symptoms possible bacterial infection as trigger.  No clear need at this late date for COVID testing.  No exposure or clear evidence of flu. We will treat with antibiotics and prednisone course given chest tightness and shortness of breath.  She will continue to use albuterol every 4-6 hours as needed as well as cough suppressant.   Return precautions and ER precautions reviewed.      Relevant Medications   predniSONE (DELTASONE) 10 MG tablet   Meds ordered this encounter  Medications   predniSONE (DELTASONE) 10 MG tablet    Sig: 3 tabs by mouth  daily x 3 days, then 2 tabs by mouth daily x 2 days then 1 tab by mouth daily x 2 days    Dispense:  15 tablet    Refill:  0   azithromycin (ZITHROMAX) 250 MG tablet    Sig: 2 tab po x 1 day then 1 tab po daily    Dispense:  6 tablet    Refill:  0     Eliezer Lofts, MD

## 2022-04-29 NOTE — Assessment & Plan Note (Addendum)
Acute, given greater than 2 weeks of symptoms possible bacterial infection as trigger.  No clear need at this late date for COVID testing.  No exposure or clear evidence of flu. We will treat with antibiotics and prednisone course given chest tightness and shortness of breath.  She will continue to use albuterol every 4-6 hours as needed as well as cough suppressant.   Return precautions and ER precautions reviewed.

## 2022-04-29 NOTE — Patient Instructions (Signed)
Start antibiotics.Marland Kitchen azithromycin x 5 days.  Mucinex DM twice daily as needed.  Albuterol every 4-6 hours a need for wheeze and chest tightness.

## 2022-05-03 DIAGNOSIS — M5136 Other intervertebral disc degeneration, lumbar region: Secondary | ICD-10-CM | POA: Diagnosis not present

## 2022-05-03 DIAGNOSIS — M0589 Other rheumatoid arthritis with rheumatoid factor of multiple sites: Secondary | ICD-10-CM | POA: Diagnosis not present

## 2022-05-03 DIAGNOSIS — D696 Thrombocytopenia, unspecified: Secondary | ICD-10-CM | POA: Diagnosis not present

## 2022-05-24 IMAGING — CT CT ABD-PELV W/ CM
2 of 5 series · 15 of 46 positions shown, 17 images · IV contrast (omnipaque)
Comparison: 05/01/2020 and the MRI of 05/14/2020.

CLINICAL DATA: Uterine/cervical cancer. Evaluate treatment
response. Currently on chemotherapy. Status post hysterectomy with
bilateral salpingo oophorectomy 04/07/2020. Vaginal myomectomy.

EXAM:
CT ABDOMEN AND PELVIS WITH CONTRAST
TECHNIQUE: Multidetector CT imaging of the abdomen and pelvis was performed
using the standard protocol following bolus administration of
intravenous contrast.
CONTRAST:  100mL OMNIPAQUE IOHEXOL 300 MG/ML  SOLN

[Series 2: axial st · axial · 0.70mm/px · z∈[-495,-110]mm · 12 of 91 slices shown, 14 images]
[im 7/91  soft-tissue]
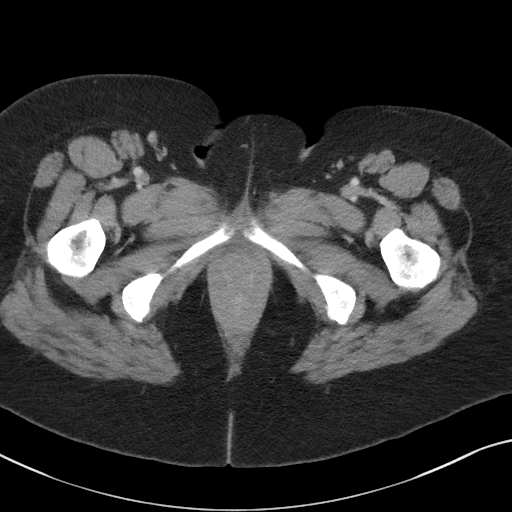
[im 7/91  bone]
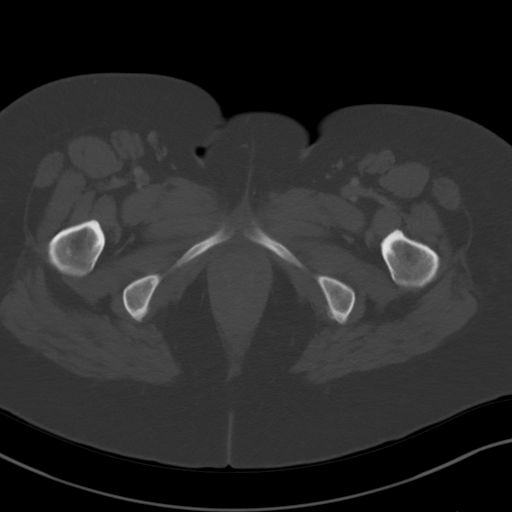
[im 13/91  soft-tissue]
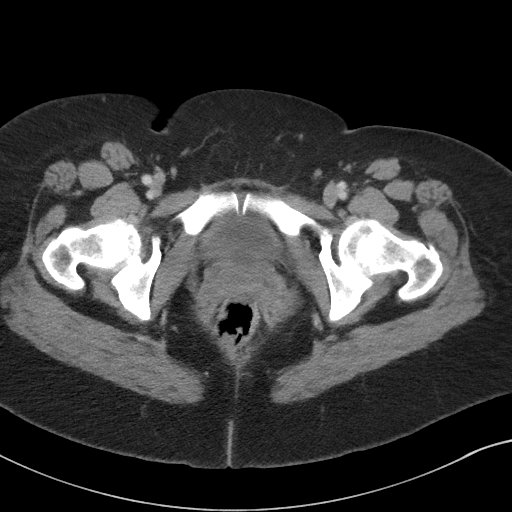
[im 20/91  soft-tissue]
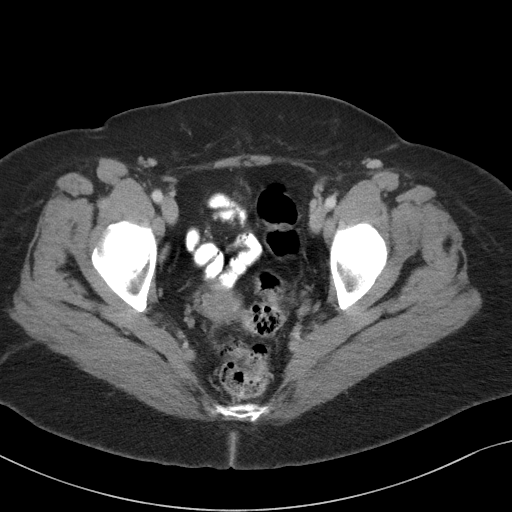
[im 26/91  soft-tissue]
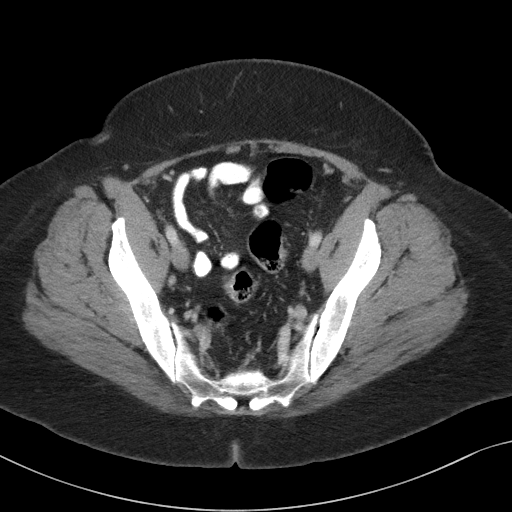
[im 33/91  soft-tissue]
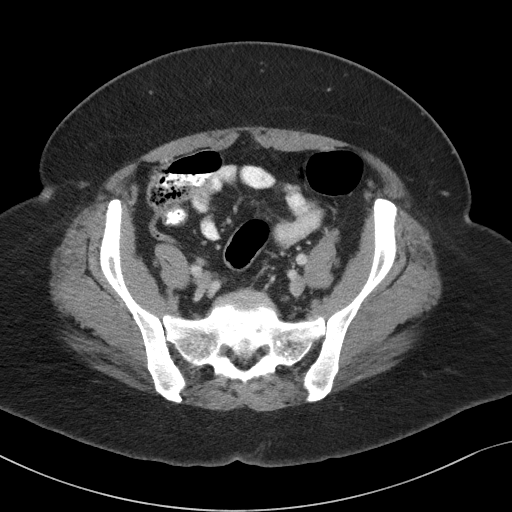
[im 39/91  soft-tissue]
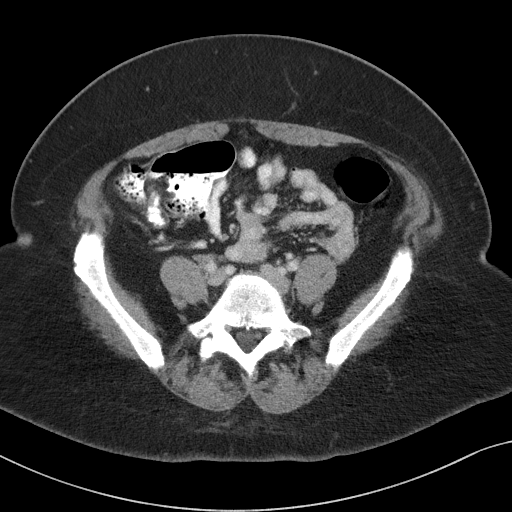
[im 52/91  soft-tissue]
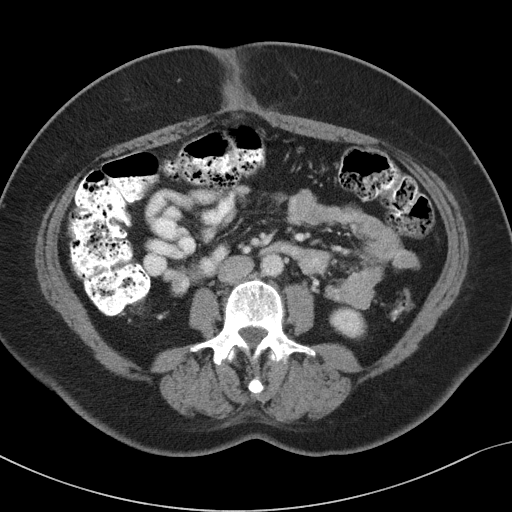
[im 58/91  soft-tissue]
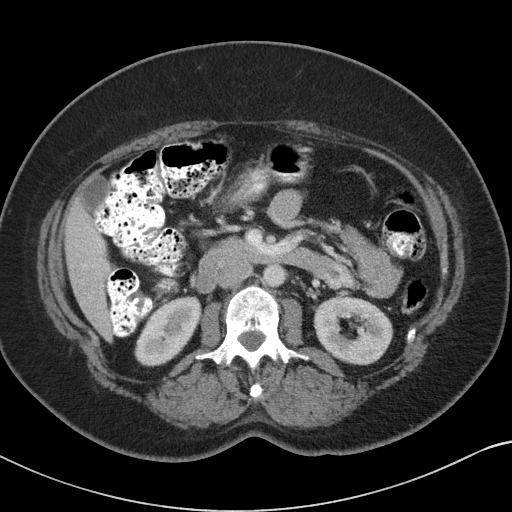
[im 65/91  soft-tissue]
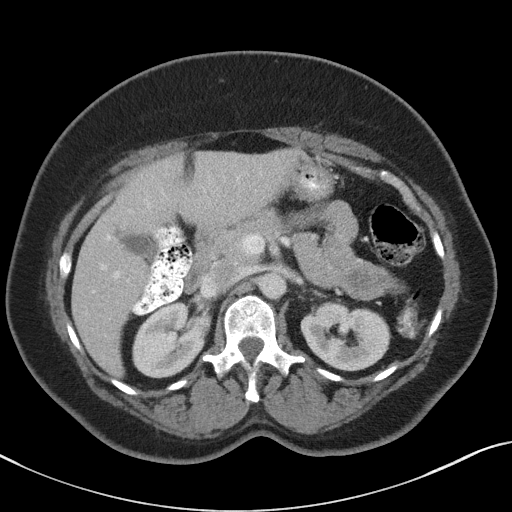
[im 65/91  bone]
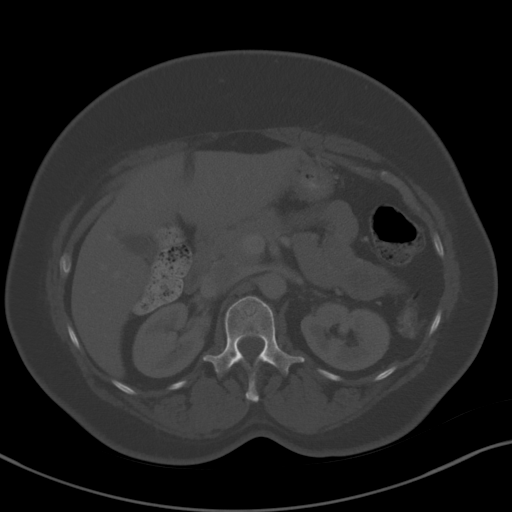
[im 71/91  soft-tissue]
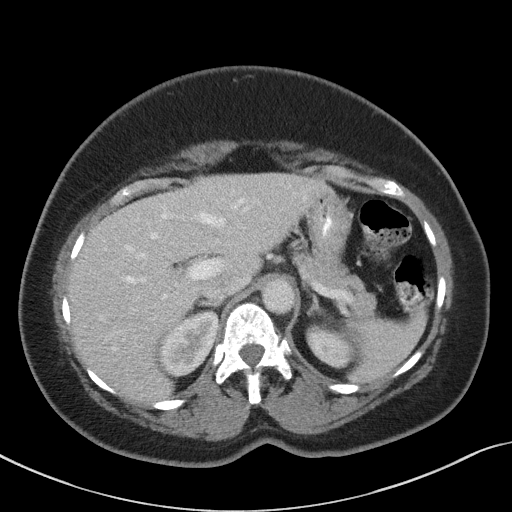
[im 78/91  soft-tissue]
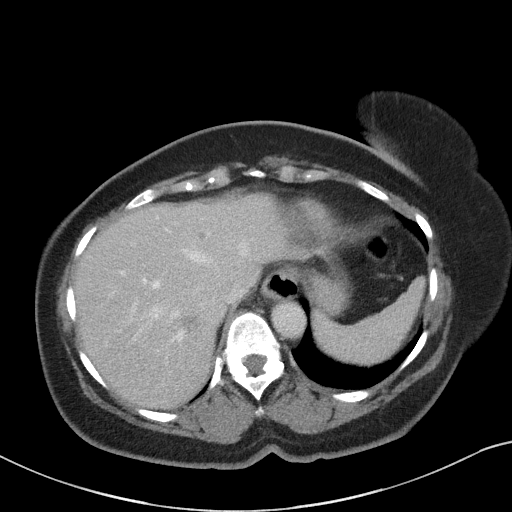
[im 84/91  soft-tissue]
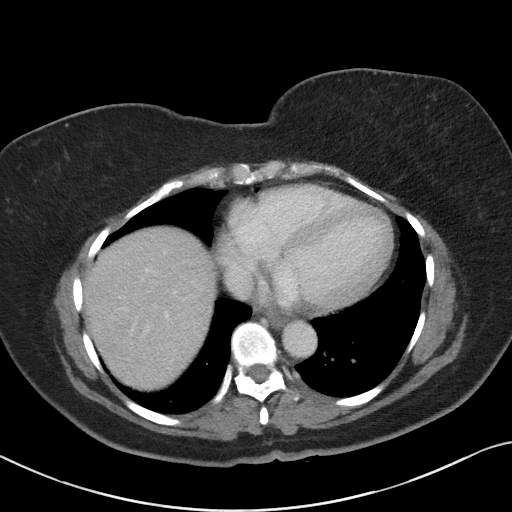

[Series 5: coronal st · coronal · 0.62mm/px · 3 of 94 slices shown]
[im 32/94  soft-tissue]
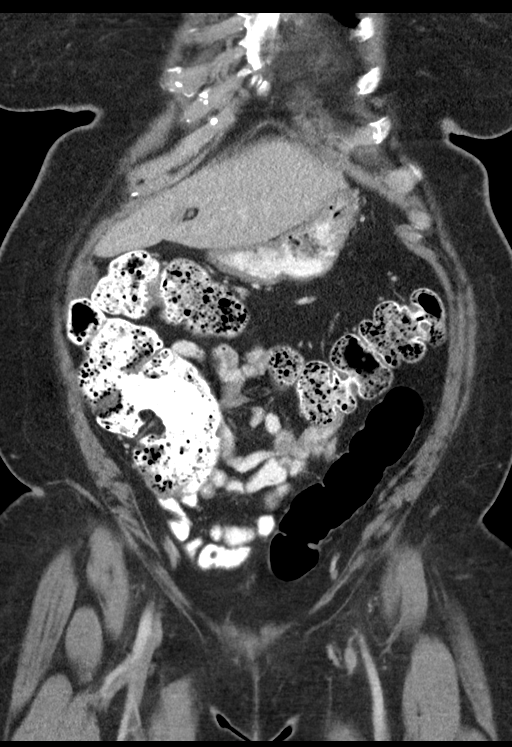
[im 42/94  soft-tissue]
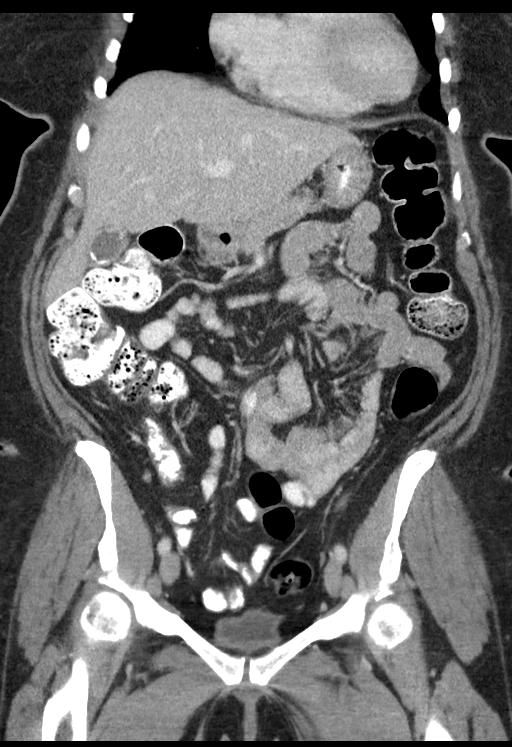
[im 52/94  soft-tissue]
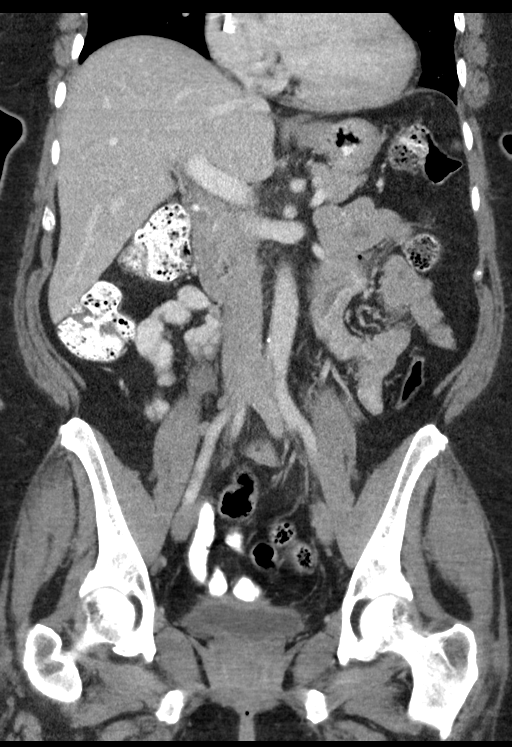

[15 of 46 positions shown; findings below may reference images not displayed]

FINDINGS: Lower chest: Clear lung bases. Borderline cardiomegaly, without
pericardial or pleural effusion. Central line tip in low right
atrium. Tiny hiatal hernia.

Hepatobiliary: Right hepatic lobe hypoattenuating lesions x2 are
similar, including at up to 1.4 cm on [DATE]. These were consistent
with hemangiomas on the prior MRI.

Left-sided smaller lesions, including at up to 1.2 cm in segment 2
on [DATE] are similar, favoring a benign etiology.

2.3 cm gallstone. No acute cholecystitis or biliary duct dilatation.

Pancreas: Normal, without mass or ductal dilatation.

Spleen: Normal in size, without focal abnormality.

Adrenals/Urinary Tract: Normal adrenal glands. Normal left kidney.
Too small to characterize interpolar right renal lesion. Normal
urinary bladder.

Stomach/Bowel: Normal remainder of the stomach. Transverse duodenal
diverticulum. Otherwise normal small bowel. Normal colon, appendix,
and terminal ileum.

Vascular/Lymphatic: Aortic atherosclerosis. No abdominopelvic
adenopathy.

Reproductive: Hysterectomy and bilateral oophorectomy. Irregular
soft tissue density in the region of the vaginal cuff. Example
eccentric right at 2.8 x 2.6 cm on 74/2. On the order of 2.1 x
cm on the prior exam (when remeasured).

More inferiorly and eccentric left, contiguous soft tissue density
measures 3.3 cm on 75/2, similar to 3.4 cm on the prior exam.

Other: No significant free fluid. No evidence of omental or
peritoneal disease.

Musculoskeletal: Left sacral sclerotic lesion is likely a bone
island. Degenerative disc disease at L4-5.
IMPRESSION: 1. Status post hysterectomy and bilateral oophorectomy. Irregular
soft tissue density in the region of the vaginal cuff is relatively
similar to on the prior exam. Although this could represent
developing scar or chronic hematoma, residual disease cannot be
excluded. This could either be re-evaluated at follow-up or if a
more aggressive approach is desired, characterized with PET.
2. No other evidence of metastatic disease in the abdomen or pelvis.
3. Hepatic hemangiomas, as before. Smaller liver lesions are
unchanged, favoring a benign etiology.
4. Cholelithiasis.
5. Tiny hiatal hernia.
6. Aortic Atherosclerosis ([id number]-[id number]).

## 2022-06-01 ENCOUNTER — Encounter: Payer: Self-pay | Admitting: Certified Registered Nurse Anesthetist

## 2022-06-06 ENCOUNTER — Telehealth: Payer: Self-pay | Admitting: Gastroenterology

## 2022-06-06 NOTE — Telephone Encounter (Signed)
Sent to mychart per pt's request.

## 2022-06-06 NOTE — Telephone Encounter (Signed)
Inbound call from patient rescheduling procedure due to hours changing at work. Patient is rescheduled for procedure 07/07/22 at 4 pm. Patient needs updated prep instructions.  Thank you

## 2022-06-07 ENCOUNTER — Encounter: Payer: BC Managed Care – PPO | Admitting: Gastroenterology

## 2022-06-29 ENCOUNTER — Inpatient Hospital Stay: Payer: BC Managed Care – PPO | Attending: Hematology and Oncology | Admitting: Obstetrics & Gynecology

## 2022-06-29 ENCOUNTER — Encounter (INDEPENDENT_AMBULATORY_CARE_PROVIDER_SITE_OTHER): Payer: Self-pay

## 2022-06-29 ENCOUNTER — Encounter: Payer: Self-pay | Admitting: Obstetrics & Gynecology

## 2022-06-29 ENCOUNTER — Other Ambulatory Visit: Payer: Self-pay

## 2022-06-29 VITALS — BP 120/69 | HR 66 | Temp 98.4°F | Resp 17 | Wt 201.6 lb

## 2022-06-29 DIAGNOSIS — Z8542 Personal history of malignant neoplasm of other parts of uterus: Secondary | ICD-10-CM | POA: Insufficient documentation

## 2022-06-29 DIAGNOSIS — Z9221 Personal history of antineoplastic chemotherapy: Secondary | ICD-10-CM | POA: Insufficient documentation

## 2022-06-29 DIAGNOSIS — C549 Malignant neoplasm of corpus uteri, unspecified: Secondary | ICD-10-CM

## 2022-06-29 NOTE — Progress Notes (Signed)
Follow Up Note: Gyn-Onc  Rachel Rivas 61 y.o. female  CC: She presents for a f/u visit   HPI: The oncology history was reviewed.  Interval History: She denies any vaginal bleeding, abdominal/pelvic pain, cough, lethargy or increasing abdominal girth. She was recently seen by Dr. Alvy Bimler and was felt to have no signs of residual disease.  Review of Systems  Review of Systems  Constitutional:  Negative for malaise/fatigue and weight loss.  Respiratory:  Negative for shortness of breath and wheezing.   Cardiovascular:  Negative for chest pain and leg swelling.  Gastrointestinal:  Negative for abdominal pain, blood in stool, constipation, nausea and vomiting.  Genitourinary:  Negative for dysuria, frequency, hematuria and urgency.  Musculoskeletal:  Negative for joint pain and myalgias.  Neurological:  Negative for weakness.  Psychiatric/Behavioral:  Negative for depression. The patient does not have insomnia.    Current medications, allergy, social history, past surgical history, past medical history, family history were all reviewed.    Vitals:  BP 120/69 (BP Location: Left Arm, Patient Position: Sitting)   Pulse 66   Temp 98.4 F (36.9 C) (Oral)   Resp 17   Wt 201 lb 9 oz (91.4 kg)   LMP 11/17/2012   SpO2 99%   BMI 35.71 kg/m    Physical Exam:  Physical Exam Exam conducted with a chaperone present.  Constitutional:      General: She is not in acute distress. Cardiovascular:     Rate and Rhythm: Normal rate and regular rhythm.  Pulmonary:     Effort: Pulmonary effort is normal.     Breath sounds: Normal breath sounds. No wheezing or rhonchi.  Abdominal:     Palpations: Abdomen is soft.     Tenderness: There is no abdominal tenderness. There is no right CVA tenderness or left CVA tenderness.     Hernia: No hernia is present.  Genitourinary:    General: Normal vulva.     Urethra: No urethral lesion.     Vagina: No lesions. No bleeding Musculoskeletal:     Cervical  back: Neck supple.     Right lower leg: No edema.     Left lower leg: No edema.  Lymphadenopathy:     Upper Body:     Right upper body: No supraclavicular adenopathy.     Left upper body: No supraclavicular adenopathy.     Lower Body: No right inguinal adenopathy. No left inguinal adenopathy.  Skin:    Findings: No rash.  Neurological:     Mental Status: She is oriented to person, place, and time.   Assessment/Plan: Adenosarcoma of body of uterus ([place]) 61 yo w/incidental diagnosis of stage IB adenosarcoma of the uterus removed with hysterectomy by Dr Dellis Filbert on 04/07/20.  S/p adjuvant chemotherapy with 6 cycles of carboplatin and paclitaxel completed October, 2021. Negative symptom review, normal exam.  No evidence of recurrence   >recommend q 6 monthly surveillance examinations with symptom review, physical exam starting in October   I personally spent 25 minutes face-to-face and non-face-to-face in the care of this patient, which includes all pre, intra, and post visit time on the date of service.   Lahoma Crocker, MD

## 2022-06-29 NOTE — Patient Instructions (Addendum)
It was nice seeing you today. We will see you again 6 months after your follow up with Dr. Alvy Bimler.  Return in May 2024. You can call in February to get scheduled.   As always if you need anything before then please call the office at [phone]

## 2022-06-29 NOTE — Assessment & Plan Note (Addendum)
61 yo w/incidental diagnosis of stage IB adenosarcoma of the uterus removed with hysterectomy by Dr Dellis Filbert on 04/07/20.  S/p adjuvant chemotherapy with 6 cycles of carboplatin and paclitaxel completed October, 2021. Negative symptom review, normal exam.  No evidence of recurrence  >recommend q 6 monthly surveillance examinations with symptom review, physical exam starting in October

## 2022-07-07 ENCOUNTER — Encounter: Payer: Self-pay | Admitting: Gastroenterology

## 2022-07-07 ENCOUNTER — Ambulatory Visit (AMBULATORY_SURGERY_CENTER): Payer: BC Managed Care – PPO | Admitting: Gastroenterology

## 2022-07-07 VITALS — BP 123/74 | HR 72 | Temp 98.0°F | Resp 12 | Ht 63.0 in | Wt 214.0 lb

## 2022-07-07 DIAGNOSIS — D125 Benign neoplasm of sigmoid colon: Secondary | ICD-10-CM

## 2022-07-07 DIAGNOSIS — Z09 Encounter for follow-up examination after completed treatment for conditions other than malignant neoplasm: Secondary | ICD-10-CM | POA: Diagnosis not present

## 2022-07-07 DIAGNOSIS — Z86010 Personal history of colonic polyps: Secondary | ICD-10-CM | POA: Diagnosis not present

## 2022-07-07 DIAGNOSIS — Z1211 Encounter for screening for malignant neoplasm of colon: Secondary | ICD-10-CM | POA: Diagnosis not present

## 2022-07-07 DIAGNOSIS — D120 Benign neoplasm of cecum: Secondary | ICD-10-CM

## 2022-07-07 DIAGNOSIS — K635 Polyp of colon: Secondary | ICD-10-CM | POA: Diagnosis not present

## 2022-07-07 MED ORDER — SODIUM CHLORIDE 0.9 % IV SOLN: 500.0000 mL | Freq: Once | INTRAVENOUS | Status: DC

## 2022-07-07 NOTE — Progress Notes (Signed)
Vitals-DT  Pt's states no medical or surgical changes since previsit or office visit.   CRNA notified about blood sugar.  Stated that the patient is okay. Will let us know if Dr Havery Moros wants D5W.

## 2022-07-07 NOTE — Patient Instructions (Signed)
Handout on polyps given to you today   YOU HAD AN ENDOSCOPIC PROCEDURE TODAY AT [facility]:   Refer to the procedure report that was given to you for any specific questions about what was found during the examination.  If the procedure report does not answer your questions, please call your gastroenterologist to clarify.  If you requested that your care partner not be given the details of your procedure findings, then the procedure report has been included in a sealed envelope for you to review at your convenience later.  YOU SHOULD EXPECT: Some feelings of bloating in the abdomen. Passage of more gas than usual.  Walking can help get rid of the air that was put into your GI tract during the procedure and reduce the bloating. If you had a lower endoscopy (such as a colonoscopy or flexible sigmoidoscopy) you may notice spotting of blood in your stool or on the toilet paper. If you underwent a bowel prep for your procedure, you may not have a normal bowel movement for a few days.  Please Note:  You might notice some irritation and congestion in your nose or some drainage.  This is from the oxygen used during your procedure.  There is no need for concern and it should clear up in a day or so.  SYMPTOMS TO REPORT IMMEDIATELY:  Following lower endoscopy (colonoscopy or flexible sigmoidoscopy):  Excessive amounts of blood in the stool  Significant tenderness or worsening of abdominal pains  Swelling of the abdomen that is new, acute  Fever of 100F or higher  For urgent or emergent issues, a gastroenterologist can be reached at any hour by calling [phone]. Do not use MyChart messaging for urgent concerns.    DIET:  We do recommend a small meal at first, but then you may proceed to your regular diet.  Drink plenty of fluids but you should avoid alcoholic beverages for 24 hours.  ACTIVITY:  You should plan to take it easy for the rest of today and you should NOT DRIVE or use  heavy machinery until tomorrow (because of the sedation medicines used during the test).    FOLLOW UP: Our staff will call the number listed on your records the next business day following your procedure.  We will call around 7:15- 8:00 am to check on you and address any questions or concerns that you may have regarding the information given to you following your procedure. If we do not reach you, we will leave a message.  If you develop any symptoms (ie: fever, flu-like symptoms, shortness of breath, cough etc.) before then, please call [phone].  If you test positive for Covid 19 in the 2 weeks post procedure, please call and report this information to [place].    If any biopsies were taken you will be contacted by phone or by letter within the next 1-3 weeks.  Please call us at [phone] if you have not heard about the biopsies in 3 weeks.    SIGNATURES/CONFIDENTIALITY: You and/or your care partner have signed paperwork which will be entered into your electronic medical record.  These signatures attest to the fact that that the information above on your After Visit Summary has been reviewed and is understood.  Full responsibility of the confidentiality of this discharge information lies with you and/or your care-partner.

## 2022-07-07 NOTE — Progress Notes (Signed)
PT taken to PACU. Monitors in place. VSS. Report given to RN. 

## 2022-07-07 NOTE — Progress Notes (Signed)
[facility] History and Physical   Primary Care Physician:  [name], MD   Reason for Procedure:   History of colon polyps  Plan:    colonoscopy     HPI: Rachel Rivas is a 61 y.o. female  here for colonoscopy surveillance - adenoma removed in 2017. Patient denies any bowel symptoms at this time. No family history of colon cancer known. Otherwise feels well without any cardiopulmonary symptoms.  She does have low platelets noted on labs.  I have discussed risks / benefits of anesthesia and endoscopic procedure with [name] and they wish to proceed with the exams as outlined today.    Past Medical History:  Diagnosis Date   Allergy    occasionally   Anemia    yrs ago, low platelets   Arthritis    RA   Back pain    Blood transfusion without reported diagnosis    1985 with c section   Borderline diabetic    Cancer ([place]) 2021   Uterine   Diabetes mellitus without complication (HCC)    Dyspnea    While taking Megace   GERD (gastroesophageal reflux disease)    Hypertension    Joint pain    Obstructive sleep apnea syndrome 01/07/2020   Prediabetes    Rheumatoid arthritis ([place])    Sciatica    Sleep apnea    uses CPAP   SOB (shortness of breath)    Thrombocytopenia ([place])     Past Surgical History:  Procedure Laterality Date   CESAREAN SECTION     COLONOSCOPY  02/22/2016   Dr.Rashon Westrup   IR IMAGING GUIDED PORT INSERTION  05/22/2020   IR REMOVAL TUN ACCESS W/ PORT W/O FL MOD SED  04/30/2021   MYOMECTOMY N/A 04/07/2020   Procedure: VAGINAL MYOMECTOMY;  Surgeon: Princess Bruins, MD;  Location: [facility];  Service: Gynecology;  Laterality: N/A;   POLYPECTOMY     ROBOTIC ASSISTED TOTAL HYSTERECTOMY WITH BILATERAL SALPINGO OOPHERECTOMY Bilateral 04/07/2020   Procedure: XI ROBOTIC TOTAL LAPAROSCOPIC HYSTERECTOMY, BILATERAL SALPINGO OOPHORECTOMY;  Surgeon: Princess Bruins, MD;  Location: [facility];  Service:  Gynecology;  Laterality: Bilateral;   STERILIZATION     TUBAL LIGATION      Prior to Admission medications   Medication Sig Start Date End Date Taking? Authorizing Provider  Coenzyme Q10 (COQ10 PO) Take 2 each by mouth daily.   Yes [provider]  losartan-hydrochlorothiazide (HYZAAR) 50-12.5 MG tablet Take 1 tablet by mouth daily. 08/31/21  Yes Bedsole, Amy E, MD  Multiple Vitamin (MULTIVITAMIN ADULT PO) Take by mouth.   Yes [provider]  rosuvastatin (CRESTOR) 5 MG tablet Take 1 tablet (5 mg total) by mouth daily. 04/26/22  Yes Buford Dresser, MD  Semaglutide,0.25 or 0.'5MG'$ /DOS, (OZEMPIC, 0.25 OR 0.5 MG/DOSE,) 2 MG/3ML SOPN Inject 0.5 mg into the skin once a week. 04/15/22  Yes Bedsole, Amy E, MD  VITAMIN D PO Take by mouth.   Yes [provider]  albuterol (VENTOLIN HFA) 108 (90 Base) MCG/ACT inhaler Inhale 2 puffs into the lungs every 6 (six) hours as needed for wheezing or shortness of breath. 07/31/20   Heath Lark, MD  cyclobenzaprine (FLEXERIL) 10 MG tablet Take 1 tablet (10 mg total) by mouth at bedtime as needed for muscle spasms. 10/23/20   Bedsole, Amy E, MD  diclofenac Sodium (VOLTAREN) 1 % GEL Apply topically. 04/13/22   [provider]  famotidine (PEPCID) 20 MG tablet TAKE 1 TABLET BY MOUTH  TWICE DAILY FOR HEART 01/14/22   [name], MD    Current Outpatient Medications  Medication Sig Dispense Refill   Coenzyme Q10 (COQ10 PO) Take 2 each by mouth daily.     losartan-hydrochlorothiazide (HYZAAR) 50-12.5 MG tablet Take 1 tablet by mouth daily. 90 tablet 3   Multiple Vitamin (MULTIVITAMIN ADULT PO) Take by mouth.     rosuvastatin (CRESTOR) 5 MG tablet Take 1 tablet (5 mg total) by mouth daily. 30 tablet 6   Semaglutide,0.25 or 0.'5MG'$ /DOS, (OZEMPIC, 0.25 OR 0.5 MG/DOSE,) 2 MG/3ML SOPN Inject 0.5 mg into the skin once a week. 3 mL 11   VITAMIN D PO Take by mouth.     albuterol (VENTOLIN HFA) 108 (90 Base) MCG/ACT inhaler Inhale 2  puffs into the lungs every 6 (six) hours as needed for wheezing or shortness of breath. 8 g 2   cyclobenzaprine (FLEXERIL) 10 MG tablet Take 1 tablet (10 mg total) by mouth at bedtime as needed for muscle spasms. 15 tablet 0   diclofenac Sodium (VOLTAREN) 1 % GEL Apply topically.     famotidine (PEPCID) 20 MG tablet TAKE 1 TABLET BY MOUTH TWICE DAILY FOR HEART 180 tablet 1   Current Facility-Administered Medications  Medication Dose Route Frequency Provider Last Rate Last Admin   0.9 %  sodium chloride infusion  500 mL Intravenous Once Alaylah Heatherington, Carlota Raspberry, MD        Allergies as of 07/07/2022 - Review Complete 07/07/2022  Allergen Reaction Noted   Latex Itching, Dermatitis, and Rash 03/10/2015   Sulfa antibiotics Nausea And Vomiting 02/07/2021   Elemental sulfur Nausea Only 11/05/2019    Family History  Problem Relation Age of Onset   Lung cancer Mother    Hypertension Mother    Diabetes Mother    Uterine cancer Mother    Cancer Mother        endometrial   Obesity Mother    Hypertension Father    Diabetes Father    Stroke Father    Hyperlipidemia Father    Obesity Father    Colon cancer Neg Hx    Colon polyps Neg Hx    Esophageal cancer Neg Hx    Rectal cancer Neg Hx    Stomach cancer Neg Hx     Social History   Socioeconomic History   Marital status: Married    Spouse name: Geneticist, molecular   Number of children: 2   Years of education: Not on file   Highest education level: Not on file  Occupational History   Occupation: licensed Education officer, community resident    Employer: [employer]  Tobacco Use   Smoking status: Former    Packs/day: 0.25    Years: 12.00    Total pack years: 3.00    Types: Cigarettes   Smokeless tobacco: Never  Vaping Use   Vaping Use: Never used  Substance and Sexual Activity   Alcohol use: Yes    Comment: occasional-wine   Drug use: No    Comment: past; marijuana   Sexual activity: Yes    Birth control/protection: Post-menopausal     Comment: 1st intercourse 61 yo-Fewer than 5 partners  Other Topics Concern   Not on file  Social History Narrative   Moved here from [place] two years ago.      Works for [employer].            Social Determinants of Health   Financial Resource Strain: Not on file  Food Insecurity: Not  on file  Transportation Needs: Not on file  Physical Activity: Not on file  Stress: Not on file  Social Connections: Not on file  Intimate Partner Violence: Not on file    Review of Systems: All other review of systems negative except as mentioned in the HPI.  Physical Exam: Vital signs BP (!) 125/58 (BP Location: Right Arm, Patient Position: Sitting, Cuff Size: Normal)   Pulse 75   Temp 98 F (36.7 C) (Temporal)   Ht '5\' 3"'$  (1.6 m)   Wt 214 lb (97.1 kg)   LMP 11/17/2012   SpO2 100%   BMI 37.91 kg/m   General:   Alert,  Well-developed, pleasant and cooperative in NAD Lungs:  Clear throughout to auscultation.   Heart:  Regular rate and rhythm Abdomen:  Soft, nontender and nondistended.   Neuro/Psych:  Alert and cooperative. Normal mood and affect. A and O x 3  Jolly Mango, MD [facility]

## 2022-07-07 NOTE — Op Note (Addendum)
[facility] Patient Name: Rachel Rivas Procedure Date: 07/07/2022 3:34 PM MRN: [id number] Endoscopist: Remo Lipps P. Havery Rachel Rivas Referring MD:  Date of Birth: [birth year] Gender: Female Account #: [id number] Procedure:                Colonoscopy Indications:              High risk colon cancer surveillance: Personal                            history of colonic polyps - adenoma removed 02/2016 Medicines:                Monitored Anesthesia Care Procedure:                Pre-Anesthesia Assessment:                           - Prior to the procedure, a History and Physical                            was performed, and patient medications and                            allergies were reviewed. The patient's tolerance of                            previous anesthesia was also reviewed. The risks                            and benefits of the procedure and the sedation                            options and risks were discussed with the patient.                            All questions were answered, and informed consent                            was obtained. Prior Anticoagulants: The patient has                            taken no previous anticoagulant or antiplatelet                            agents. ASA Grade Assessment: II - A patient with                            mild systemic disease. After reviewing the risks                            and benefits, the patient was deemed in                            satisfactory condition to undergo the procedure.  After obtaining informed consent, the colonoscope                            was passed under direct vision. Throughout the                            procedure, the patient's blood pressure, pulse, and                            oxygen saturations were monitored continuously. The                            CF [id number] #[id number] was introduced through the anus                            and  advanced to the the cecum, identified by                            appendiceal orifice and ileocecal valve. The                            colonoscopy was performed without difficulty. The                            patient tolerated the procedure well. The quality                            of the bowel preparation was good. The ileocecal                            valve, appendiceal orifice, and rectum were                            photographed. Scope In: 3:51:43 PM Scope Out: 4:14:54 PM Scope Withdrawal Time: 0 hours 18 minutes 36 seconds  Total Procedure Duration: 0 hours 23 minutes 11 seconds  Findings:                 The perianal and digital rectal examinations were                            normal.                           A diminutive polyp was found in the cecum. The                            polyp was sessile. It was very difficult to see                            behind the IC valve which was lobulated and                            lipomatous. Difficult to visualize behind the  valve, had to be pleated back with the forceps. The                            polyp was removed with a cold biopsy forceps.                            Resection and retrieval were complete.                           Two sessile polyps were found in the sigmoid colon.                            The polyps were 3 to 4 mm in size. These polyps                            were removed with a cold snare. Resection and                            retrieval were complete.                           The exam was otherwise without abnormality. Of                            note, small rectum, retroflexed views difficult to                            obtain. Complications:            No immediate complications. Estimated blood loss:                            Minimal. Estimated Blood Loss:     Estimated blood loss was minimal. Impression:               - One diminutive polyp in  the cecum, removed with a                            cold biopsy forceps. Resected and retrieved.                           - Two 3 to 4 mm polyps in the sigmoid colon,                            removed with a cold snare. Resected and retrieved.                           - The examination was otherwise normal. Recommendation:           - Patient has a contact number available for                            emergencies. The signs and symptoms of potential  delayed complications were discussed with the                            patient. Return to normal activities tomorrow.                            Written discharge instructions were provided to the                            patient.                           - Resume previous diet.                           - Continue present medications.                           - Await pathology results. Remo Lipps P. Rachel Schuermann, MD 07/07/2022 4:20:39 PM This report has been signed electronically.

## 2022-07-07 NOTE — Progress Notes (Signed)
Called to room to assist during endoscopic procedure.  Patient ID and intended procedure confirmed with present staff. Received instructions for my participation in the procedure from the performing physician.  

## 2022-07-16 DIAGNOSIS — I10 Essential (primary) hypertension: Secondary | ICD-10-CM | POA: Diagnosis not present

## 2022-07-16 DIAGNOSIS — G4733 Obstructive sleep apnea (adult) (pediatric): Secondary | ICD-10-CM | POA: Diagnosis not present

## 2022-07-22 ENCOUNTER — Ambulatory Visit: Payer: BC Managed Care – PPO | Admitting: Family Medicine

## 2022-07-22 ENCOUNTER — Encounter: Payer: Self-pay | Admitting: Family Medicine

## 2022-07-22 VITALS — BP 120/70 | HR 67 | Temp 97.6°F | Ht 63.0 in | Wt 201.4 lb

## 2022-07-22 DIAGNOSIS — E1159 Type 2 diabetes mellitus with other circulatory complications: Secondary | ICD-10-CM | POA: Diagnosis not present

## 2022-07-22 DIAGNOSIS — I152 Hypertension secondary to endocrine disorders: Secondary | ICD-10-CM | POA: Diagnosis not present

## 2022-07-22 DIAGNOSIS — E1169 Type 2 diabetes mellitus with other specified complication: Secondary | ICD-10-CM | POA: Diagnosis not present

## 2022-07-22 LAB — POCT GLYCOSYLATED HEMOGLOBIN (HGB A1C): Hemoglobin A1C: 5.7 % — AB (ref 4.0–5.6)

## 2022-07-22 NOTE — Progress Notes (Signed)
Patient ID: Rachel Rivas, female    DOB: Feb 02, 1961, 61 y.o.   MRN: [id number]  This visit was conducted in person.  BP 120/70   Pulse 67   Temp 97.6 F (36.4 C) (Oral)   Ht '5\' 3"'$  (1.6 m)   Wt 201 lb 6 oz (91.3 kg)   LMP 11/17/2012   SpO2 98%   BMI 35.67 kg/m    CC:  Chief Complaint  Patient presents with   Diabetes   Weight Check    Subjective:   HPI: Rachel Rivas is a 61 y.o. female presenting on 07/22/2022 for Diabetes and Weight Check Reviewed labs in detail with patient.  Diabetes: 3 months ago her semaglutide was increased to 0.5 mg weekly.  She has noted a resulting 9 pound weight loss. Lab Results  Component Value Date   HGBA1C 5.7 (A) 07/22/2022  Using medications without difficulties: No nausea or abdominal pain. Hypoglycemic episodes: Hyperglycemic episodes: Feet problems: no ulcers Blood Sugars averaging: not checking. eye exam within last year: yes  Wt Readings from Last 3 Encounters:  07/22/22 201 lb 6 oz (91.3 kg)  07/07/22 214 lb (97.1 kg)  06/29/22 201 lb 9 oz (91.4 kg)   Body mass index is 35.67 kg/m.  Hypertension:   Well controlled on losartan hydrochlorothiazide 50/12.5 mg p.o. daily BP Readings from Last 3 Encounters:  07/22/22 120/70  07/07/22 123/74  06/29/22 120/69  Using medication without problems or lightheadedness:  occ Chest pain with exertion: none Edema: none Short of breath: Average home BPs: Other issues:       Relevant past medical, surgical, family and social history reviewed and updated as indicated. Interim medical history since our last visit reviewed. Allergies and medications reviewed and updated. Outpatient Medications Prior to Visit  Medication Sig Dispense Refill   albuterol (VENTOLIN HFA) 108 (90 Base) MCG/ACT inhaler Inhale 2 puffs into the lungs every 6 (six) hours as needed for wheezing or shortness of breath. 8 g 2   Coenzyme Q10 (COQ10 PO) Take 2 each by mouth daily.     cyclobenzaprine  (FLEXERIL) 10 MG tablet Take 1 tablet (10 mg total) by mouth at bedtime as needed for muscle spasms. 15 tablet 0   diclofenac Sodium (VOLTAREN) 1 % GEL Apply topically.     famotidine (PEPCID) 20 MG tablet TAKE 1 TABLET BY MOUTH TWICE DAILY FOR HEART 180 tablet 1   losartan-hydrochlorothiazide (HYZAAR) 50-12.5 MG tablet Take 1 tablet by mouth daily. 90 tablet 3   Multiple Vitamin (MULTIVITAMIN ADULT PO) Take by mouth.     rosuvastatin (CRESTOR) 5 MG tablet Take 1 tablet (5 mg total) by mouth daily. 30 tablet 6   Semaglutide,0.25 or 0.'5MG'$ /DOS, (OZEMPIC, 0.25 OR 0.5 MG/DOSE,) 2 MG/3ML SOPN Inject 0.5 mg into the skin once a week. 3 mL 11   VITAMIN D PO Take by mouth.     No facility-administered medications prior to visit.     Per HPI unless specifically indicated in ROS section below Review of Systems Objective:  BP 120/70   Pulse 67   Temp 97.6 F (36.4 C) (Oral)   Ht '5\' 3"'$  (1.6 m)   Wt 201 lb 6 oz (91.3 kg)   LMP 11/17/2012   SpO2 98%   BMI 35.67 kg/m   Wt Readings from Last 3 Encounters:  07/22/22 201 lb 6 oz (91.3 kg)  07/07/22 214 lb (97.1 kg)  06/29/22 201 lb 9 oz (91.4 kg)      Physical  Exam    Results for orders placed or performed in visit on 04/15/22  Comprehensive metabolic panel  Result Value Ref Range   Sodium 140 135 - 145 mmol/L   Potassium 3.8 3.5 - 5.1 mmol/L   Chloride 104 98 - 111 mmol/L   CO2 30 22 - 32 mmol/L   Glucose, Bld 132 (H) 70 - 99 mg/dL   BUN 13 8 - 23 mg/dL   Creatinine, Ser 0.87 0.44 - 1.00 mg/dL   Calcium 9.7 8.9 - 10.3 mg/dL   Total Protein 7.5 6.5 - 8.1 g/dL   Albumin 4.2 3.5 - 5.0 g/dL   AST 13 (L) 15 - 41 U/L   ALT 15 0 - 44 U/L   Alkaline Phosphatase 72 38 - 126 U/L   Total Bilirubin 0.6 0.3 - 1.2 mg/dL   GFR, Estimated >60 >60 mL/min   Anion gap 6 5 - 15  CBC with Differential/Platelet  Result Value Ref Range   WBC 7.3 4.0 - 10.5 K/uL   RBC 4.15 3.87 - 5.11 MIL/uL   Hemoglobin 11.2 (L) 12.0 - 15.0 g/dL   HCT 34.7 (L) 36.0  - 46.0 %   MCV 83.6 80.0 - 100.0 fL   MCH 27.0 26.0 - 34.0 pg   MCHC 32.3 30.0 - 36.0 g/dL   RDW 15.4 11.5 - 15.5 %   Platelets 58 (L) 150 - 400 K/uL   nRBC 0.0 0.0 - 0.2 %   Neutrophils Relative % 56 %   Neutro Abs 4.0 1.7 - 7.7 K/uL   Lymphocytes Relative 33 %   Lymphs Abs 2.4 0.7 - 4.0 K/uL   Monocytes Relative 9 %   Monocytes Absolute 0.6 0.1 - 1.0 K/uL   Eosinophils Relative 2 %   Eosinophils Absolute 0.1 0.0 - 0.5 K/uL   Basophils Relative 0 %   Basophils Absolute 0.0 0.0 - 0.1 K/uL   Immature Granulocytes 0 %   Abs Immature Granulocytes 0.03 0.00 - 0.07 K/uL     COVID 19 screen:  No recent travel or known exposure to COVID19 The patient denies respiratory symptoms of COVID 19 at this time. The importance of social distancing was discussed today.   Assessment and Plan Problem List Items Addressed This Visit     Hypertension associated with type 2 diabetes mellitus ([place])   Severe obesity (BMI 35.0-39.9) with comorbidity ([place])    Continued weight loss on semaglutide.  She has had 10 pounds of weight loss in the last 3 months.  We will continue the current dose of semaglutide but discussed that if she reaches a plateau with her weight loss she will contact me to potentially increase her medication.      Type 2 diabetes mellitus with other circulatory complications (HCC) - Primary    Chronic, improved control with semaglutide 0.5 mg weekly.  She continues to work on low carbohydrate diet.  She has had occasional lightheadedness and potentially low blood sugars at times when skipping meals.  We discussed increasing fiber and protein in her meals and doing smaller frequent healthy snacks.  Associated with hypertension          Eliezer Lofts, MD

## 2022-07-22 NOTE — Assessment & Plan Note (Signed)
Chronic, improved control with semaglutide 0.5 mg weekly.  She continues to work on low carbohydrate diet.  She has had occasional lightheadedness and potentially low blood sugars at times when skipping meals.  We discussed increasing fiber and protein in her meals and doing smaller frequent healthy snacks.  Associated with hypertension

## 2022-07-22 NOTE — Patient Instructions (Addendum)
Increase protein in diet and eat more frequently, don't skip meals.   Smaller frequent meals.  Continue current medication!

## 2022-07-22 NOTE — Assessment & Plan Note (Signed)
Continued weight loss on semaglutide.  She has had 10 pounds of weight loss in the last 3 months.  We will continue the current dose of semaglutide but discussed that if she reaches a plateau with her weight loss she will contact me to potentially increase her medication.

## 2022-08-03 ENCOUNTER — Other Ambulatory Visit: Payer: Self-pay | Admitting: Family Medicine

## 2022-08-27 ENCOUNTER — Other Ambulatory Visit: Payer: Self-pay | Admitting: Family Medicine

## 2022-08-28 ENCOUNTER — Encounter: Payer: Self-pay | Admitting: Family Medicine

## 2022-08-29 NOTE — Telephone Encounter (Signed)
Handicap Application form placed in Dr. Rometta Emery office in box to complete.

## 2022-09-16 ENCOUNTER — Telehealth: Payer: Self-pay | Admitting: Hematology and Oncology

## 2022-09-16 NOTE — Telephone Encounter (Signed)
Rescheduled appointment per providers template. Left message with new appointment times.

## 2022-10-17 ENCOUNTER — Ambulatory Visit: Payer: BC Managed Care – PPO | Admitting: Hematology and Oncology

## 2022-10-17 ENCOUNTER — Other Ambulatory Visit: Payer: BC Managed Care – PPO

## 2022-10-20 ENCOUNTER — Encounter: Payer: Self-pay | Admitting: Hematology and Oncology

## 2022-10-20 ENCOUNTER — Inpatient Hospital Stay: Payer: BC Managed Care – PPO | Admitting: Hematology and Oncology

## 2022-10-20 ENCOUNTER — Other Ambulatory Visit: Payer: Self-pay

## 2022-10-20 ENCOUNTER — Inpatient Hospital Stay: Payer: BC Managed Care – PPO | Attending: Hematology and Oncology

## 2022-10-20 VITALS — BP 104/61 | HR 66 | Temp 97.5°F | Resp 18 | Ht 63.0 in | Wt 200.6 lb

## 2022-10-20 DIAGNOSIS — C549 Malignant neoplasm of corpus uteri, unspecified: Secondary | ICD-10-CM

## 2022-10-20 DIAGNOSIS — Z8542 Personal history of malignant neoplasm of other parts of uterus: Secondary | ICD-10-CM | POA: Diagnosis not present

## 2022-10-20 DIAGNOSIS — D696 Thrombocytopenia, unspecified: Secondary | ICD-10-CM | POA: Diagnosis not present

## 2022-10-20 DIAGNOSIS — Z9221 Personal history of antineoplastic chemotherapy: Secondary | ICD-10-CM | POA: Diagnosis not present

## 2022-10-20 DIAGNOSIS — D693 Immune thrombocytopenic purpura: Secondary | ICD-10-CM | POA: Insufficient documentation

## 2022-10-20 DIAGNOSIS — R109 Unspecified abdominal pain: Secondary | ICD-10-CM | POA: Diagnosis not present

## 2022-10-20 LAB — CBC WITH DIFFERENTIAL/PLATELET
Abs Immature Granulocytes: 0.02 10*3/uL (ref 0.00–0.07)
Basophils Absolute: 0 10*3/uL (ref 0.0–0.1)
Basophils Relative: 0 %
Eosinophils Absolute: 0.1 10*3/uL (ref 0.0–0.5)
Eosinophils Relative: 2 %
HCT: 36.6 % (ref 36.0–46.0)
Hemoglobin: 11.6 g/dL — ABNORMAL LOW (ref 12.0–15.0)
Immature Granulocytes: 0 %
Lymphocytes Relative: 36 %
Lymphs Abs: 2.7 10*3/uL (ref 0.7–4.0)
MCH: 27 pg (ref 26.0–34.0)
MCHC: 31.7 g/dL (ref 30.0–36.0)
MCV: 85.1 fL (ref 80.0–100.0)
Monocytes Absolute: 0.7 10*3/uL (ref 0.1–1.0)
Monocytes Relative: 9 %
Neutro Abs: 3.9 10*3/uL (ref 1.7–7.7)
Neutrophils Relative %: 53 %
Platelets: 57 10*3/uL — ABNORMAL LOW (ref 150–400)
RBC: 4.3 MIL/uL (ref 3.87–5.11)
RDW: 14.8 % (ref 11.5–15.5)
WBC: 7.4 10*3/uL (ref 4.0–10.5)
nRBC: 0 % (ref 0.0–0.2)

## 2022-10-20 LAB — COMPREHENSIVE METABOLIC PANEL
ALT: 12 U/L (ref 0–44)
AST: 12 U/L — ABNORMAL LOW (ref 15–41)
Albumin: 4.6 g/dL (ref 3.5–5.0)
Alkaline Phosphatase: 68 U/L (ref 38–126)
Anion gap: 5 (ref 5–15)
BUN: 15 mg/dL (ref 8–23)
CO2: 32 mmol/L (ref 22–32)
Calcium: 9.6 mg/dL (ref 8.9–10.3)
Chloride: 103 mmol/L (ref 98–111)
Creatinine, Ser: 0.8 mg/dL (ref 0.44–1.00)
GFR, Estimated: >60 mL/min (ref >60)
Glucose, Bld: 109 mg/dL — ABNORMAL HIGH (ref 70–99)
Potassium: 3.9 mmol/L (ref 3.5–5.1)
Sodium: 140 mmol/L (ref 135–145)
Total Bilirubin: 0.7 mg/dL (ref 0.3–1.2)
Total Protein: 7.8 g/dL (ref 6.5–8.1)

## 2022-10-20 NOTE — Progress Notes (Signed)
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[place]) I am not able to appreciate any abnormalities on exam However, due to the aggressive nature of her disease, I recommend CT imaging of the abdomen and pelvis for evaluation and she is in agreement  Thrombocytopenia ([place]) Her chronic ITP is stable She does not need transfusion support or additional treatment for chronic ITP I will continue close observation  Abdominal discomfort She has nonspecific abdominal discomfort on the right lower quadrant She had recent colonoscopy that came back normal As above, I plan to order CT imaging for evaluation  Severe obesity (BMI 35.0-39.9) with comorbidity ([place]) According to the patient, she is making good progress since she was started on Ozempic She appears motivated with lifestyle changes for weight loss  Orders Placed This Encounter  Procedures   CT ABDOMEN PELVIS W CONTRAST    Standing Status:   Future    Standing Expiration Date:   10/21/2023    Order Specific Question:   If indicated for the ordered procedure, I authorize the administration of contrast media per Radiology protocol    Answer:   Yes    Order Specific Question:   Preferred imaging location?    Answer:   [facility]    Order Specific Question:   Radiology Contrast Protocol - do NOT remove file path    Answer:   [email or web address]    All questions were answered. The patient knows to call the clinic with any problems, questions or concerns. The total time spent in the appointment was 30 minutes encounter with patients including review of chart and various tests results, discussions about plan of care and coordination of care plan   Heath Lark, MD 10/20/2022 1:25 PM  INTERVAL  HISTORY: Please see below for problem oriented charting. she returns for surveillance follow-up Since last time I saw her, she has intermittent right lower quadrant discomfort No recent changes in bowel habits Denies abdominal bloating, vaginal bleeding or other new symptoms   REVIEW OF SYSTEMS:   Constitutional: Denies fevers, chills or abnormal weight loss Eyes: Denies blurriness of vision Ears, nose, mouth, throat, and face: Denies mucositis or sore throat Respiratory: Denies cough, dyspnea or wheezes Cardiovascular: Denies palpitation, chest discomfort or lower extremity swelling Gastrointestinal:  Denies nausea, heartburn or change in bowel habits Skin: Denies abnormal skin rashes Lymphatics: Denies new lymphadenopathy or easy bruising Neurological:Denies numbness, tingling or new weaknesses Behavioral/Psych: Mood is stable, no new changes  All other systems were reviewed with the patient and are negative.  I have reviewed the past medical history, past surgical history, social history and family history with the patient and they are unchanged from previous note.  ALLERGIES:  is allergic to latex, sulfa antibiotics, and elemental sulfur.  MEDICATIONS:  Current Outpatient Medications  Medication Sig Dispense Refill   albuterol (VENTOLIN HFA) 108 (90 Base) MCG/ACT inhaler Inhale 2 puffs into the lungs every 6 (six) hours as needed for wheezing or shortness of breath. 8 g 2   Coenzyme Q10 (COQ10 PO) Take 2 each by mouth daily.     cyclobenzaprine (FLEXERIL) 10 MG tablet Take 1 tablet (10 mg total) by mouth at bedtime as needed for muscle spasms. 15 tablet 0   diclofenac Sodium (VOLTAREN) 1 % GEL Apply topically.     famotidine (PEPCID)  20 MG tablet TAKE 1 TABLET BY MOUTH TWICE DAILY FOR HEART 180 tablet 1   losartan-hydrochlorothiazide (HYZAAR) 50-12.5 MG tablet Take 1 tablet by mouth once daily 90 tablet 1   Multiple Vitamin (MULTIVITAMIN ADULT PO) Take by mouth.      rosuvastatin (CRESTOR) 5 MG tablet Take 1 tablet (5 mg total) by mouth daily. 30 tablet 6   Semaglutide,0.25 or 0.5MG/DOS, (OZEMPIC, 0.25 OR 0.5 MG/DOSE,) 2 MG/3ML SOPN Inject 0.5 mg into the skin once a week. 3 mL 11   VITAMIN D PO Take by mouth.     No current facility-administered medications for this visit.    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 She saw her PCP on 4/13 for bleeding and an ultrasound was obtained showing a thickened endometrial lining and 3-4cm mass within the endocervical canal. She was referred to [facility], started on Megace and ultimately underwent a TRH/BSO on 5/18 for abnormal uterine bleeding with resulting anemia, large prolapsing fibroid.    03/06/2020 Imaging   US pelvis Markedly thickened heterogeneous, and hypervascular endometrial complex up to 38 mm thick highly concerning for endometrial neoplasm.   Additional more focal 3.0 x 2.8 x 5.3 cm diameter mass within endocervical canal, may represent extension of above endometrial pathology versus separate endocervical tumor.   Tissue diagnosis recommended.   Nonvisualization of ovaries.   04/07/2020 Pathology Results   A. UTERUS, CERVIX AND BILATERAL FALLOPIAN TUBES AND OVARIES, HYSTERECTOMY AND BILATERAL SALPINGO-OOPHORECTOMY: - Mullerian adenosarcoma of endometrium. - Tumor limited to the uterus. - Tumor invades to less than half of the myometrium. - No involvement of adnexa. - Margins of resection are not involved. - See oncology table and comment. ONCOLOGY TABLE: UTERUS, SARCOMA: Procedure: Total hysterectomy and bilateral salpingo-oophorectomy Specimen Integrity: The uterus, cervix and bilateral fallopian tubes and ovaries were received intact. The nodule clinically identified as vaginally prolapsed uterine  fibroid was received separate and disrupted. Tumor Size: Greatest dimension: 7.5 cm Histologic Type: Mullerian adenosarcoma with overgrowth of the epithelial and stromal components Histologic Grade: The epithelial component has proliferated to form endometrioid adenocarcinoma which ranges from low grade to high grade. The stromal component focally shows sarcomatous overgrowth. Myometrial Invasion: Present Depth of invasion: 4 mm Myometrial thickness: 30 mm Other Tissue/Organ Involvement: Not identified Margins: Uninvolved by sarcoma Lymphovascular Invasion: Not identified Regional Lymph Nodes: No lymph nodes submitted or found Pathologic Stage Classification (pTNM, AJCC 8th Edition): pT1b, pNX Additional Pathologic Findings: Adenomyosis. Leiomyomata.   04/07/2020 Surgery   PRE-OPERATIVE DIAGNOSIS:  Large prolapsed fibroid vaginally, menometrorrhagia with anemia   POST-OPERATIVE DIAGNOSIS:  Large prolapsed fibroid vaginally, menometrorrhagia with anemia.  Omental adhesions with anterior abdominal wall.   PROCEDURE:  Procedure(s): XI ROBOTIC TOTAL LAPAROSCOPIC HYSTERECTOMY, BILATERAL SALPINGO OOPHORECTOMY/LYSIS OF ADHESIONS/VAGINAL MYOMECTOMY    FINDINGS: Large necrotic vaginally prolapsed uterine myoma.  Uterus with small subserosal/intramural myoma.  Bilateral tubes post tubal ligation.  Bilateral normal ovaries.  Omental adhesions with anterior abdominal wall.   05/01/2020 Imaging   Ct scan of chest, abdomen and pelvis 1. Multiple ill-defined low density liver masses, suspicious for metastases. These could be further characterized with pre and postcontrast magnetic resonance imaging of the liver. 2. Moderate diffuse vaginal wall thickening, possibly representing edema associated with the patient's recent surgery. 3. Small hiatal hernia. 4. Minimal coronary artery atheromatous calcifications.  5. Cholelithiasis. 6. Tiny rounded area of low density in the posterior spleen, too small to  characterize.   05/11/2020 Cancer Staging   Staging form: Corpus Uteri - Adenosarcoma, AJCC 8th Edition - Pathologic: Stage IB (pT1b, pN0, cM0) - Signed by Heath Lark, MD on 05/15/2020   05/14/2020 Imaging   MRI liver 1. The 2 dominant right hepatic lobe lesions have MR imaging features most consistent with benign cavernous hemangioma. The remaining tiny liver lesions are too small to characterize. Statistically, while these are likely benign, follow-up MRI in 3 months recommended to ensure stability. 2. No other findings to suggest metastatic disease.   05/22/2020 Procedure   Placement of a subcutaneous port device. Catheter tip at the superior cavoatrial junction   05/29/2020 - 09/11/2020 Chemotherapy   The patient had carboplatin and taxol for chemotherapy treatment.     07/22/2020 Imaging   1. Status post hysterectomy and bilateral oophorectomy. Irregular soft tissue density in the region of the vaginal cuff is relatively similar to on the prior exam. Although this could represent developing scar or chronic hematoma, residual disease cannot be excluded. This could either be re-evaluated at follow-up or if a more aggressive approach is desired, characterized with PET. 2. No other evidence of metastatic disease in the abdomen or pelvis. 3. Hepatic hemangiomas, as before. Smaller liver lesions are unchanged, favoring a benign etiology. 4. Cholelithiasis. 5. Tiny hiatal hernia. 6. Aortic Atherosclerosis (ICD10-I70.0).   10/12/2020 Imaging   1. Stable appearance of the vaginal cuff status post hysterectomy. Soft tissue fullness appears unchanged, likely postsurgical in etiology. Correlate with physical examination and tumor markers. 2. No evidence of metastatic disease. 3. Stable hepatic hemangiomas. 4. Cholelithiasis without evidence of cholecystitis or biliary dilatation. 5. Aortic Atherosclerosis (ICD10-I70.0).   04/12/2021 Imaging   1. Status post hysterectomy. Unchanged appearance of  somewhat nodular soft tissue at the left and right aspects of the vaginal cuff, again favored to represent postoperative change without specific findings or evidence of progression to suggest local malignant recurrence. 2. No noncontrast evidence of metastatic disease in the abdomen or pelvis. 3. Cholelithiasis.   Aortic Atherosclerosis (ICD10-I70.0).   04/30/2021 Procedure   Removal of implanted Port-A-Cath utilizing sharp and blunt dissection. The procedure was uncomplicated.     PHYSICAL EXAMINATION: ECOG PERFORMANCE STATUS: 1 - Symptomatic but completely ambulatory  Vitals:   10/20/22 0814  BP: 104/61  Pulse: 66  Resp: 18  Temp: (!) 97.5 F (36.4 C)  SpO2: 100%   Filed Weights   10/20/22 0814  Weight: 200 lb 9.6 oz (91 kg)    GENERAL:alert, no distress and comfortable SKIN: skin color, texture, turgor are normal, no rashes or significant lesions EYES: normal, Conjunctiva are pink and non-injected, sclera clear OROPHARYNX:no exudate, no erythema and lips, buccal mucosa, and tongue normal  NECK: supple, thyroid normal size, non-tender, without nodularity LYMPH:  no palpable lymphadenopathy in the cervical, axillary or inguinal LUNGS: clear to auscultation and percussion with normal breathing effort HEART: regular rate & rhythm and no murmurs and no lower extremity edema ABDOMEN:abdomen soft, non-tender and normal bowel sounds Musculoskeletal:no cyanosis of digits and no clubbing  NEURO: alert & oriented x 3 with fluent speech, no focal motor/sensory deficits  LABORATORY DATA:  I have reviewed the data as listed    Component Value Date/Time   NA 140 10/20/2022 0755   NA 142 01/28/2021 0922   NA 142 04/07/2017 0810   K 3.9 10/20/2022 0755   K 3.8 04/07/2017 0810  CL 103 10/20/2022 0755   CO2 32 10/20/2022 0755   CO2 28 04/07/2017 0810   GLUCOSE 109 (H) 10/20/2022 0755   GLUCOSE 100 04/07/2017 0810   BUN 15 10/20/2022 0755   BUN 17 01/28/2021 0922   BUN 16.8  04/07/2017 0810   CREATININE 0.80 10/20/2022 0755   CREATININE 0.78 04/12/2021 1301   CREATININE 0.9 04/07/2017 0810   CALCIUM 9.6 10/20/2022 0755   CALCIUM 9.9 04/07/2017 0810   PROT 7.8 10/20/2022 0755   PROT 7.6 01/28/2021 0922   PROT 8.0 04/07/2017 0810   ALBUMIN 4.6 10/20/2022 0755   ALBUMIN 4.8 01/28/2021 0922   ALBUMIN 4.4 04/07/2017 0810   AST 12 (L) 10/20/2022 0755   AST 15 04/12/2021 1301   AST 18 04/07/2017 0810   ALT 12 10/20/2022 0755   ALT 16 04/12/2021 1301   ALT 23 04/07/2017 0810   ALKPHOS 68 10/20/2022 0755   ALKPHOS 79 04/07/2017 0810   BILITOT 0.7 10/20/2022 0755   BILITOT 0.5 04/12/2021 1301   BILITOT 0.70 04/07/2017 0810   GFRNONAA >60 10/20/2022 0755   GFRNONAA >60 04/12/2021 1301   GFRAA >60 08/21/2020 0805    No results found for: "SPEP", "UPEP"  Lab Results  Component Value Date   WBC 7.4 10/20/2022   NEUTROABS 3.9 10/20/2022   HGB 11.6 (L) 10/20/2022   HCT 36.6 10/20/2022   MCV 85.1 10/20/2022   PLT 57 (L) 10/20/2022      Chemistry      Component Value Date/Time   NA 140 10/20/2022 0755   NA 142 01/28/2021 0922   NA 142 04/07/2017 0810   K 3.9 10/20/2022 0755   K 3.8 04/07/2017 0810   CL 103 10/20/2022 0755   CO2 32 10/20/2022 0755   CO2 28 04/07/2017 0810   BUN 15 10/20/2022 0755   BUN 17 01/28/2021 0922   BUN 16.8 04/07/2017 0810   CREATININE 0.80 10/20/2022 0755   CREATININE 0.78 04/12/2021 1301   CREATININE 0.9 04/07/2017 0810      Component Value Date/Time   CALCIUM 9.6 10/20/2022 0755   CALCIUM 9.9 04/07/2017 0810   ALKPHOS 68 10/20/2022 0755   ALKPHOS 79 04/07/2017 0810   AST 12 (L) 10/20/2022 0755   AST 15 04/12/2021 1301   AST 18 04/07/2017 0810   ALT 12 10/20/2022 0755   ALT 16 04/12/2021 1301   ALT 23 04/07/2017 0810   BILITOT 0.7 10/20/2022 0755   BILITOT 0.5 04/12/2021 1301   BILITOT 0.70 04/07/2017 0810

## 2022-10-20 NOTE — Assessment & Plan Note (Signed)
She has nonspecific abdominal discomfort on the right lower quadrant She had recent colonoscopy that came back normal As above, I plan to order CT imaging for evaluation

## 2022-10-20 NOTE — Assessment & Plan Note (Signed)
I am not able to appreciate any abnormalities on exam However, due to the aggressive nature of her disease, I recommend CT imaging of the abdomen and pelvis for evaluation and she is in agreement

## 2022-10-20 NOTE — Assessment & Plan Note (Signed)
According to the patient, she is making good progress since she was started on Ozempic She appears motivated with lifestyle changes for weight loss

## 2022-10-20 NOTE — Assessment & Plan Note (Signed)
Her chronic ITP is stable She does not need transfusion support or additional treatment for chronic ITP I will continue close observation

## 2022-10-21 ENCOUNTER — Telehealth: Payer: Self-pay

## 2022-10-21 NOTE — Telephone Encounter (Signed)
Called and scheduled appt with Dr. Alvy Bimler at 8 am on 12/11. She is aware of appt date/time

## 2022-10-27 ENCOUNTER — Ambulatory Visit (HOSPITAL_COMMUNITY)
Admission: RE | Admit: 2022-10-27 | Discharge: 2022-10-27 | Disposition: A | Discharge status: Home / Self Care | Payer: BC Managed Care – PPO | Source: Ambulatory Visit | Attending: Hematology and Oncology | Admitting: Hematology and Oncology

## 2022-10-27 DIAGNOSIS — C549 Malignant neoplasm of corpus uteri, unspecified: Secondary | ICD-10-CM | POA: Diagnosis not present

## 2022-10-27 DIAGNOSIS — C55 Malignant neoplasm of uterus, part unspecified: Secondary | ICD-10-CM | POA: Diagnosis not present

## 2022-10-27 DIAGNOSIS — C541 Malignant neoplasm of endometrium: Secondary | ICD-10-CM | POA: Diagnosis not present

## 2022-10-27 DIAGNOSIS — K8020 Calculus of gallbladder without cholecystitis without obstruction: Secondary | ICD-10-CM | POA: Diagnosis not present

## 2022-10-27 DIAGNOSIS — D1803 Hemangioma of intra-abdominal structures: Secondary | ICD-10-CM | POA: Diagnosis not present

## 2022-10-27 MED ORDER — IOHEXOL 300 MG/ML  SOLN
100.0000 mL | Freq: Once | INTRAMUSCULAR | Status: AC | PRN
Start: 2022-10-27 — End: 2022-10-27
  Administered 2022-10-27: 100 mL via INTRAVENOUS

## 2022-10-30 ENCOUNTER — Encounter: Payer: Self-pay | Admitting: Hematology and Oncology

## 2022-10-30 DIAGNOSIS — H6993 Unspecified Eustachian tube disorder, bilateral: Secondary | ICD-10-CM | POA: Diagnosis not present

## 2022-10-30 DIAGNOSIS — R0981 Nasal congestion: Secondary | ICD-10-CM | POA: Diagnosis not present

## 2022-10-30 DIAGNOSIS — R590 Localized enlarged lymph nodes: Secondary | ICD-10-CM | POA: Diagnosis not present

## 2022-10-30 DIAGNOSIS — J069 Acute upper respiratory infection, unspecified: Secondary | ICD-10-CM | POA: Diagnosis not present

## 2022-10-31 ENCOUNTER — Other Ambulatory Visit: Payer: Self-pay | Admitting: Hematology and Oncology

## 2022-10-31 ENCOUNTER — Encounter: Payer: Self-pay | Admitting: Hematology and Oncology

## 2022-10-31 ENCOUNTER — Inpatient Hospital Stay: Payer: BC Managed Care – PPO | Admitting: Hematology and Oncology

## 2022-10-31 ENCOUNTER — Inpatient Hospital Stay: Payer: BC Managed Care – PPO | Attending: Hematology and Oncology | Admitting: Hematology and Oncology

## 2022-10-31 DIAGNOSIS — D696 Thrombocytopenia, unspecified: Secondary | ICD-10-CM | POA: Diagnosis not present

## 2022-10-31 DIAGNOSIS — C549 Malignant neoplasm of corpus uteri, unspecified: Secondary | ICD-10-CM

## 2022-10-31 NOTE — Assessment & Plan Note (Signed)
CT imaging show no evidence of disease She has signs of constipation I recommend regular laxatives She will see GYN clinic in February for pelvic exam I will see her once a year

## 2022-10-31 NOTE — Assessment & Plan Note (Signed)
Her chronic ITP is stable She does not need transfusion support or additional treatment for chronic ITP I will continue close observation

## 2022-10-31 NOTE — Progress Notes (Signed)
HEMATOLOGY-ONCOLOGY ELECTRONIC VISIT PROGRESS NOTE  Patient Care Team: [name], MD as PCP - General (Family Medicine) Buford Dresser, MD as PCP - Cardiology (Cardiology)  I connected with the patient via telephone conference and verified that I am speaking with the correct person using two identifiers. The patient's location is at home and I am providing care from the  [facility] I discussed the limitations, risks, security and privacy concerns of performing an evaluation and management service by e-visits and the availability of in person appointments.  I also discussed with the patient that there may be a patient responsible charge related to this service. The patient expressed understanding and agreed to proceed.   ASSESSMENT & PLAN:  Adenosarcoma of body of uterus (Rachel Rivas) CT imaging show no evidence of disease She has signs of constipation I recommend regular laxatives She will see GYN clinic in February for pelvic exam I will see her once a year  Thrombocytopenia (Rachel Rivas) Her chronic ITP is stable She does not need transfusion support or additional treatment for chronic ITP I will continue close observation  No orders of the defined types were placed in this encounter.   INTERVAL HISTORY: Please see below for problem oriented charting. The purpose of today's discussion is to review test results Over the weekend, she went to urgent care due to nasal congestion.  COVID was ruled out.  She was prescribed decongestions She denies recent bleeding We discussed CT imaging results  SUMMARY OF ONCOLOGIC HISTORY: Oncology History Overview Note  [facility] stable   Adenosarcoma of body of uterus ([place])  03/03/2020 Initial Diagnosis   She developed PMB in early April after going through menopause at approximately age 61. She saw her PCP on 4/13 for bleeding and an ultrasound was obtained showing a thickened endometrial lining and 3-4cm mass within the endocervical canal. She  was referred to [facility], started on Megace and ultimately underwent a TRH/BSO on 5/18 for abnormal uterine bleeding with resulting anemia, large prolapsing fibroid.    03/06/2020 Imaging   US pelvis Markedly thickened heterogeneous, and hypervascular endometrial complex up to 38 mm thick highly concerning for endometrial neoplasm.   Additional more focal 3.0 x 2.8 x 5.3 cm diameter mass within endocervical canal, may represent extension of above endometrial pathology versus separate endocervical tumor.   Tissue diagnosis recommended.   Nonvisualization of ovaries.   04/07/2020 Pathology Results   A. UTERUS, CERVIX AND BILATERAL FALLOPIAN TUBES AND OVARIES, HYSTERECTOMY AND BILATERAL SALPINGO-OOPHORECTOMY: - Mullerian adenosarcoma of endometrium. - Tumor limited to the uterus. - Tumor invades to less than half of the myometrium. - No involvement of adnexa. - Margins of resection are not involved. - See oncology table and comment. ONCOLOGY TABLE: UTERUS, SARCOMA: Procedure: Total hysterectomy and bilateral salpingo-oophorectomy Specimen Integrity: The uterus, cervix and bilateral fallopian tubes and ovaries were received intact. The nodule clinically identified as vaginally prolapsed uterine fibroid was received separate and disrupted. Tumor Size: Greatest dimension: 7.5 cm Histologic Type: Mullerian adenosarcoma with overgrowth of the epithelial and stromal components Histologic Grade: The epithelial component has proliferated to form endometrioid adenocarcinoma which ranges from low grade to high grade. The stromal component focally shows sarcomatous overgrowth. Myometrial Invasion: Present Depth of invasion: 4 mm Myometrial thickness: 30 mm Other Tissue/Organ Involvement: Not identified Margins: Uninvolved by sarcoma Lymphovascular Invasion: Not identified Regional Lymph Nodes: No lymph nodes submitted or found Pathologic Stage Classification (pTNM, AJCC 8th Edition): pT1b,  pNX Additional Pathologic Findings: Adenomyosis. Leiomyomata.   04/07/2020 Surgery  PRE-OPERATIVE DIAGNOSIS:  Large prolapsed fibroid vaginally, menometrorrhagia with anemia   POST-OPERATIVE DIAGNOSIS:  Large prolapsed fibroid vaginally, menometrorrhagia with anemia.  Omental adhesions with anterior abdominal wall.   PROCEDURE:  Procedure(s): XI ROBOTIC TOTAL LAPAROSCOPIC HYSTERECTOMY, BILATERAL SALPINGO OOPHORECTOMY/LYSIS OF ADHESIONS/VAGINAL MYOMECTOMY    FINDINGS: Large necrotic vaginally prolapsed uterine myoma.  Uterus with small subserosal/intramural myoma.  Bilateral tubes post tubal ligation.  Bilateral normal ovaries.  Omental adhesions with anterior abdominal wall.   05/01/2020 Imaging   Ct scan of chest, abdomen and pelvis 1. Multiple ill-defined low density liver masses, suspicious for metastases. These could be further characterized with pre and postcontrast magnetic resonance imaging of the liver. 2. Moderate diffuse vaginal wall thickening, possibly representing edema associated with the patient's recent surgery. 3. Small hiatal hernia. 4. Minimal coronary artery atheromatous calcifications. 5. Cholelithiasis. 6. Tiny rounded area of low density in the posterior spleen, too small to characterize.   05/11/2020 Cancer Staging   Staging form: Corpus Uteri - Adenosarcoma, AJCC 8th Edition - Pathologic: Stage IB (pT1b, pN0, cM0) - Signed by Heath Lark, MD on 05/15/2020   05/14/2020 Imaging   MRI liver 1. The 2 dominant right hepatic lobe lesions have MR imaging features most consistent with benign cavernous hemangioma. The remaining tiny liver lesions are too small to characterize. Statistically, while these are likely benign, follow-up MRI in 3 months recommended to ensure stability. 2. No other findings to suggest metastatic disease.   05/22/2020 Procedure   Placement of a subcutaneous port device. Catheter tip at the superior cavoatrial junction   05/29/2020 - 09/11/2020  Chemotherapy   The patient had carboplatin and taxol for chemotherapy treatment.     07/22/2020 Imaging   1. Status post hysterectomy and bilateral oophorectomy. Irregular soft tissue density in the region of the vaginal cuff is relatively similar to on the prior exam. Although this could represent developing scar or chronic hematoma, residual disease cannot be excluded. This could either be re-evaluated at follow-up or if a more aggressive approach is desired, characterized with PET. 2. No other evidence of metastatic disease in the abdomen or pelvis. 3. Hepatic hemangiomas, as before. Smaller liver lesions are unchanged, favoring a benign etiology. 4. Cholelithiasis. 5. Tiny hiatal hernia. 6. Aortic Atherosclerosis (ICD10-I70.0).   10/12/2020 Imaging   1. Stable appearance of the vaginal cuff status post hysterectomy. Soft tissue fullness appears unchanged, likely postsurgical in etiology. Correlate with physical examination and tumor markers. 2. No evidence of metastatic disease. 3. Stable hepatic hemangiomas. 4. Cholelithiasis without evidence of cholecystitis or biliary dilatation. 5. Aortic Atherosclerosis (ICD10-I70.0).   04/12/2021 Imaging   1. Status post hysterectomy. Unchanged appearance of somewhat nodular soft tissue at the left and right aspects of the vaginal cuff, again favored to represent postoperative change without specific findings or evidence of progression to suggest local malignant recurrence. 2. No noncontrast evidence of metastatic disease in the abdomen or pelvis. 3. Cholelithiasis.   Aortic Atherosclerosis (ICD10-I70.0).   04/30/2021 Procedure   Removal of implanted Port-A-Cath utilizing sharp and blunt dissection. The procedure was uncomplicated.   10/31/2022 Imaging   1. Stable CT of the abdomen and pelvis. No specific findings identified to suggest residual or recurrent tumor or metastatic disease. 2. Stable liver hemangiomas. 3. Gallstone.     REVIEW OF  SYSTEMS:   Constitutional: Denies fevers, chills or abnormal weight loss Eyes: Denies blurriness of vision Ears, nose, mouth, throat, and face: Denies mucositis or sore throat Respiratory: Denies cough, dyspnea or wheezes Cardiovascular: Denies  palpitation, chest discomfort Gastrointestinal:  Denies nausea, heartburn or change in bowel habits Skin: Denies abnormal skin rashes Lymphatics: Denies new lymphadenopathy or easy bruising Neurological:Denies numbness, tingling or new weaknesses Behavioral/Psych: Mood is stable, no new changes  Extremities: No lower extremity edema All other systems were reviewed with the patient and are negative.  I have reviewed the past medical history, past surgical history, social history and family history with the patient and they are unchanged from previous note.  ALLERGIES:  is allergic to latex, sulfa antibiotics, and elemental sulfur.  MEDICATIONS:  Current Outpatient Medications  Medication Sig Dispense Refill   albuterol (VENTOLIN HFA) 108 (90 Base) MCG/ACT inhaler Inhale 2 puffs into the lungs every 6 (six) hours as needed for wheezing or shortness of breath. 8 g 2   Coenzyme Q10 (COQ10 PO) Take 2 each by mouth daily.     cyclobenzaprine (FLEXERIL) 10 MG tablet Take 1 tablet (10 mg total) by mouth at bedtime as needed for muscle spasms. 15 tablet 0   diclofenac Sodium (VOLTAREN) 1 % GEL Apply topically.     famotidine (PEPCID) 20 MG tablet TAKE 1 TABLET BY MOUTH TWICE DAILY FOR HEART 180 tablet 1   losartan-hydrochlorothiazide (HYZAAR) 50-12.5 MG tablet Take 1 tablet by mouth once daily 90 tablet 1   Multiple Vitamin (MULTIVITAMIN ADULT PO) Take by mouth.     rosuvastatin (CRESTOR) 5 MG tablet Take 1 tablet (5 mg total) by mouth daily. 30 tablet 6   Semaglutide,0.25 or 0.5MG/DOS, (OZEMPIC, 0.25 OR 0.5 MG/DOSE,) 2 MG/3ML SOPN Inject 0.5 mg into the skin once a week. 3 mL 11   VITAMIN D PO Take by mouth.     No current facility-administered  medications for this visit.    PHYSICAL EXAMINATION: ECOG PERFORMANCE STATUS: 0 - Asymptomatic  LABORATORY DATA:  I have reviewed the data as listed    Latest Ref Rng & Units 10/20/2022    7:55 AM 04/15/2022    7:34 AM 04/07/2022    7:43 AM  CMP  Glucose 70 - 99 mg/dL 109  132  103   BUN 8 - 23 mg/dL _0 Creatinine 0.44 - 1.00 mg/dL 0.80  0.87  0.91   Sodium 135 - 145 mmol/L 140  140  139   Potassium 3.5 - 5.1 mmol/L 3.9  3.8  3.8   Chloride 98 - 111 mmol/L 103  104  101   CO2 22 - 32 mmol/L 32  30  31   Calcium 8.9 - 10.3 mg/dL 9.6  9.7  10.0   Total Protein 6.5 - 8.1 g/dL 7.8  7.5  7.9   Total Bilirubin 0.3 - 1.2 mg/dL 0.7  0.6  0.4   Alkaline Phos 38 - 126 U/L 68  72  68   AST 15 - 41 U/L _1 ALT 0 - 44 U/L _2 Lab Results  Component Value Date   WBC 7.4 10/20/2022   HGB 11.6 (L) 10/20/2022   HCT 36.6 10/20/2022   MCV 85.1 10/20/2022   PLT 57 (L) 10/20/2022   NEUTROABS 3.9 10/20/2022     RADIOGRAPHIC STUDIES: I have personally reviewed the radiological images as listed and agreed with the findings in the report. CT ABDOMEN PELVIS W CONTRAST  Result Date: 10/30/2022 CLINICAL DATA:  Adenosarcoma of the uterus.  Restaging. * Tracking Code: BO * EXAM: CT ABDOMEN AND PELVIS WITH  CONTRAST TECHNIQUE: Multidetector CT imaging of the abdomen and pelvis was performed using the standard protocol following bolus administration of intravenous contrast. RADIATION DOSE REDUCTION: This exam was performed according to the departmental dose-optimization program which includes automated exposure control, adjustment of the mA and/or kV according to patient size and/or use of iterative reconstruction technique. CONTRAST:  143m OMNIPAQUE IOHEXOL 300 MG/ML  SOLN COMPARISON:  04/12/2021 FINDINGS: Lower chest: No pleural fluid or airspace disease. Hepatobiliary: Unchanged right lobe of liver hemangioma measuring 1.8 cm, image 13/2. Lateral segment left lobe of liver  hemangioma is unchanged measuring 1.6 cm. Two additional, subcentimeter low-density foci are stable compared with 04/22/2020 and either represent small cyst or small hemangiomas. No suspicious liver lesions. Gallstone measures 2.5 cm, unchanged from previous exam. No gallbladder wall thickening or inflammation. Pancreas: Unremarkable. No pancreatic ductal dilatation or surrounding inflammatory changes. Spleen: Normal in size without focal abnormality. Adrenals/Urinary Tract: Normal adrenal glands. No nephrolithiasis, hydronephrosis or kidney mass. Urinary bladder appears normal. Stomach/Bowel: Stomach appears unremarkable. The appendix is visualized and is normal. No bowel wall thickening, inflammation or distension. Vascular/Lymphatic: No significant vascular findings are present. No enlarged abdominal or pelvic lymph nodes. Reproductive: Status post hysterectomy. Again noted is prominent soft tissue within the vaginal cuff which appears unchanged since 10/12/2020. No adnexal mass identified. Other: No ascites.  No signs of peritoneal nodularity. Musculoskeletal: Lumbar degenerative disc disease noted at L4-5. No acute or suspicious osseous findings. IMPRESSION: 1. Stable CT of the abdomen and pelvis. No specific findings identified to suggest residual or recurrent tumor or metastatic disease. 2. Stable liver hemangiomas. 3. Gallstone. Electronically Signed   By: TKerby MoorsM.D.   On: 10/30/2022 08:52    I discussed the assessment and treatment plan with the patient. The patient was provided an opportunity to ask questions and all were answered. The patient agreed with the plan and demonstrated an understanding of the instructions. The patient was advised to call back or seek an in-person evaluation if the symptoms worsen or if the condition fails to improve as anticipated.    I spent 20 minutes for the appointment reviewing test results, discuss management and coordination of care.  NHeath Lark  MD 10/31/2022 8:45 AM

## 2022-11-01 ENCOUNTER — Telehealth: Payer: Self-pay | Admitting: *Deleted

## 2022-11-01 NOTE — Telephone Encounter (Signed)
Spoke with the patient and scheduled an appt for a follow up with Dr Delsa Sale

## 2022-11-03 ENCOUNTER — Ambulatory Visit: Payer: BC Managed Care – PPO | Admitting: Family Medicine

## 2022-11-03 ENCOUNTER — Encounter: Payer: Self-pay | Admitting: Family Medicine

## 2022-11-03 VITALS — BP 124/70 | HR 74 | Temp 97.9°F | Ht 63.0 in | Wt 196.6 lb

## 2022-11-03 DIAGNOSIS — J020 Streptococcal pharyngitis: Secondary | ICD-10-CM | POA: Diagnosis not present

## 2022-11-03 DIAGNOSIS — R509 Fever, unspecified: Secondary | ICD-10-CM

## 2022-11-03 DIAGNOSIS — H66002 Acute suppurative otitis media without spontaneous rupture of ear drum, left ear: Secondary | ICD-10-CM | POA: Diagnosis not present

## 2022-11-03 DIAGNOSIS — J029 Acute pharyngitis, unspecified: Secondary | ICD-10-CM

## 2022-11-03 LAB — POCT RAPID STREP A (OFFICE): Rapid Strep A Screen: POSITIVE — AB

## 2022-11-03 LAB — POC INFLUENZA A&B (BINAX/QUICKVUE)
Influenza A, POC: NEGATIVE
Influenza B, POC: NEGATIVE

## 2022-11-03 MED ORDER — AMOXICILLIN 500 MG PO CAPS: 1000.0000 mg | ORAL_CAPSULE | Freq: Two times a day (BID) | ORAL | 0 refills | Status: DC

## 2022-11-03 NOTE — Progress Notes (Signed)
Patient ID: Rachel Rivas, female    DOB: March 16, 1961, 61 y.o.   MRN: [id number]  This visit was conducted in person.  BP 124/70   Pulse 74   Temp 97.9 F (36.6 C) (Temporal)   Ht '5\' 3"'$  (1.6 m)   Wt 196 lb 9.6 oz (89.2 kg)   LMP 11/17/2012   SpO2 96%   BMI 34.83 kg/m    CC:  Chief Complaint  Patient presents with   Cough    C/o cough, runny nose, fever- max 102, B ear pain/fullness- worse in L, hoarseness, ST and eyes matted together in the mornings. Sxs started 10/28/22. Was seen previously for same sxs- 2 neg Covid tests, no improvement.     Subjective:   HPI: Rachel Rivas is a 61 y.o. female presenting on 11/03/2022 for Cough (C/o cough, runny nose, fever- max 102, B ear pain/fullness- worse in L, hoarseness, ST and eyes matted together in the mornings. Sxs started 10/28/22. Was seen previously for same sxs- 2 neg Covid tests, no improvement. )   Date of onset:  10/28/2022  Initial symptoms included  runny nose, scratchy throat, feeling tired and weak. Mild body aches. Symptoms progressed to dry cough, fever 102, ST. Left ear pain> right ear pain.  Had virtual visit... felt sinusitis.Marland Kitchen treated with  Singulair and albuterol  prn.  Continued feeling worse. Seen at urgent care .. 2 negative COVID tests  Treated with flonase, afrin and Mucinex DM.   Now worsening ST, Bilateral ear pain and Hoarse voice in last 2 days.  No further fever. Some wheeze, no SOB.    Sick contacts:  COVID testing:   none     No history of chronic lung disease such as asthma or COPD. Non-smoker.       Relevant past medical, surgical, family and social history reviewed and updated as indicated. Interim medical history since our last visit reviewed. Allergies and medications reviewed and updated. Outpatient Medications Prior to Visit  Medication Sig Dispense Refill   albuterol (VENTOLIN HFA) 108 (90 Base) MCG/ACT inhaler Inhale 2 puffs into the lungs every 6 (six) hours as needed for  wheezing or shortness of breath. 8 g 2   Coenzyme Q10 (COQ10 PO) Take 2 each by mouth daily.     cyclobenzaprine (FLEXERIL) 10 MG tablet Take 1 tablet (10 mg total) by mouth at bedtime as needed for muscle spasms. 15 tablet 0   diclofenac Sodium (VOLTAREN) 1 % GEL Apply topically.     famotidine (PEPCID) 20 MG tablet TAKE 1 TABLET BY MOUTH TWICE DAILY FOR HEART 180 tablet 1   losartan-hydrochlorothiazide (HYZAAR) 50-12.5 MG tablet Take 1 tablet by mouth once daily 90 tablet 1   Multiple Vitamin (MULTIVITAMIN ADULT PO) Take by mouth.     rosuvastatin (CRESTOR) 5 MG tablet Take 1 tablet (5 mg total) by mouth daily. 30 tablet 6   Semaglutide,0.25 or 0.'5MG'$ /DOS, (OZEMPIC, 0.25 OR 0.5 MG/DOSE,) 2 MG/3ML SOPN Inject 0.5 mg into the skin once a week. 3 mL 11   VITAMIN D PO Take by mouth.     No facility-administered medications prior to visit.     Per HPI unless specifically indicated in ROS section below Review of Systems  Constitutional:  Negative for fatigue and fever.  HENT:  Positive for congestion, ear pain, sore throat and trouble swallowing.   Eyes:  Negative for pain.  Respiratory:  Positive for cough. Negative for shortness of breath.   Cardiovascular:  Negative for  chest pain, palpitations and leg swelling.  Gastrointestinal:  Negative for abdominal pain.  Genitourinary:  Negative for dysuria and vaginal bleeding.  Musculoskeletal:  Negative for back pain.  Neurological:  Negative for syncope, light-headedness and headaches.  Psychiatric/Behavioral:  Negative for dysphoric mood.    Objective:  BP 124/70   Pulse 74   Temp 97.9 F (36.6 C) (Temporal)   Ht '5\' 3"'$  (1.6 m)   Wt 196 lb 9.6 oz (89.2 kg)   LMP 11/17/2012   SpO2 96%   BMI 34.83 kg/m   Wt Readings from Last 3 Encounters:  11/03/22 196 lb 9.6 oz (89.2 kg)  10/20/22 200 lb 9.6 oz (91 kg)  07/22/22 201 lb 6 oz (91.3 kg)      Physical Exam Constitutional:      General: She is not in acute distress.    Appearance:  Normal appearance. She is well-developed. She is not ill-appearing or toxic-appearing.  HENT:     Head: Normocephalic.     Right Ear: Hearing, ear canal and external ear normal. A middle ear effusion is present. Tympanic membrane is not erythematous, retracted or bulging.     Left Ear: Hearing, ear canal and external ear normal. A middle ear effusion is present. Tympanic membrane is injected, erythematous and bulging. Tympanic membrane is not retracted.     Nose: No mucosal edema or rhinorrhea.     Right Sinus: No maxillary sinus tenderness or frontal sinus tenderness.     Left Sinus: No maxillary sinus tenderness or frontal sinus tenderness.     Mouth/Throat:     Pharynx: Uvula midline. Posterior oropharyngeal erythema present. No pharyngeal swelling, oropharyngeal exudate or uvula swelling.  Eyes:     General: Lids are normal. Lids are everted, no foreign bodies appreciated.     Conjunctiva/sclera: Conjunctivae normal.     Pupils: Pupils are equal, round, and reactive to light.  Neck:     Thyroid: No thyroid mass or thyromegaly.     Vascular: No carotid bruit.     Trachea: Trachea normal.  Cardiovascular:     Rate and Rhythm: Normal rate and regular rhythm.     Pulses: Normal pulses.     Heart sounds: Normal heart sounds, S1 normal and S2 normal. No murmur heard.    No friction rub. No gallop.  Pulmonary:     Effort: Pulmonary effort is normal. No tachypnea or respiratory distress.     Breath sounds: Normal breath sounds. No decreased breath sounds, wheezing, rhonchi or rales.  Abdominal:     General: Bowel sounds are normal.     Palpations: Abdomen is soft.     Tenderness: There is no abdominal tenderness.  Musculoskeletal:     Cervical back: Normal range of motion and neck supple.  Skin:    General: Skin is warm and dry.     Findings: No rash.  Neurological:     Mental Status: She is alert.  Psychiatric:        Mood and Affect: Mood is not anxious or depressed.         Speech: Speech normal.        Behavior: Behavior normal. Behavior is cooperative.        Thought Content: Thought content normal.        Judgment: Judgment normal.       Results for orders placed or performed in visit on 10/20/22  Comprehensive metabolic panel  Result Value Ref Range   Sodium 140 135 -  145 mmol/L   Potassium 3.9 3.5 - 5.1 mmol/L   Chloride 103 98 - 111 mmol/L   CO2 32 22 - 32 mmol/L   Glucose, Bld 109 (H) 70 - 99 mg/dL   BUN 15 8 - 23 mg/dL   Creatinine, Ser 0.80 0.44 - 1.00 mg/dL   Calcium 9.6 8.9 - 10.3 mg/dL   Total Protein 7.8 6.5 - 8.1 g/dL   Albumin 4.6 3.5 - 5.0 g/dL   AST 12 (L) 15 - 41 U/L   ALT 12 0 - 44 U/L   Alkaline Phosphatase 68 38 - 126 U/L   Total Bilirubin 0.7 0.3 - 1.2 mg/dL   GFR, Estimated >60 >60 mL/min   Anion gap 5 5 - 15  CBC with Differential/Platelet  Result Value Ref Range   WBC 7.4 4.0 - 10.5 K/uL   RBC 4.30 3.87 - 5.11 MIL/uL   Hemoglobin 11.6 (L) 12.0 - 15.0 g/dL   HCT 36.6 36.0 - 46.0 %   MCV 85.1 80.0 - 100.0 fL   MCH 27.0 26.0 - 34.0 pg   MCHC 31.7 30.0 - 36.0 g/dL   RDW 14.8 11.5 - 15.5 %   Platelets 57 (L) 150 - 400 K/uL   nRBC 0.0 0.0 - 0.2 %   Neutrophils Relative % 53 %   Neutro Abs 3.9 1.7 - 7.7 K/uL   Lymphocytes Relative 36 %   Lymphs Abs 2.7 0.7 - 4.0 K/uL   Monocytes Relative 9 %   Monocytes Absolute 0.7 0.1 - 1.0 K/uL   Eosinophils Relative 2 %   Eosinophils Absolute 0.1 0.0 - 0.5 K/uL   Basophils Relative 0 %   Basophils Absolute 0.0 0.0 - 0.1 K/uL   Immature Granulocytes 0 %   Abs Immature Granulocytes 0.02 0.00 - 0.07 K/uL     COVID 19 screen:  No recent travel or known exposure to COVID19 The patient denies respiratory symptoms of COVID 19 at this time. The importance of social distancing was discussed today.   Assessment and Plan    Problem List Items Addressed This Visit   None Visit Diagnoses     Fever, unspecified fever cause    -  Primary   Relevant Orders   POCT rapid strep A  (Completed)   POC Influenza A&B(BINAX/QUICKVUE) (Completed)   Sore throat       Relevant Orders   POCT rapid strep A (Completed)   Strep throat       Non-recurrent acute suppurative otitis media of left ear without spontaneous rupture of tympanic membrane       Relevant Medications   amoxicillin (AMOXIL) 500 MG capsule      Treat with 10-day course of amoxicillin 500 mg 2 capsules twice daily.  Symptomatic care with Tylenol extra strength.  Rest, fluids.  Return and ER precautions provided.  Meds ordered this encounter  Medications   amoxicillin (AMOXIL) 500 MG capsule    Sig: Take 2 capsules (1,000 mg total) by mouth 2 (two) times daily.    Dispense:  40 capsule    Refill:  0     Eliezer Lofts, MD

## 2022-11-08 ENCOUNTER — Encounter: Payer: Self-pay | Admitting: Family Medicine

## 2022-11-09 NOTE — Telephone Encounter (Signed)
Spoke to patient by telephone and was advised that her other symptoms are better. Patient stated that she still can not hear out of her left ear. Offered patient an appointment for a recheck which she declined stating that she was just seen. Pharmacy [facility], [place]

## 2022-11-12 ENCOUNTER — Ambulatory Visit
Admission: EM | Admit: 2022-11-12 | Discharge: 2022-11-12 | Disposition: A | Discharge status: Home / Self Care | Payer: BC Managed Care – PPO | Attending: Urgent Care | Admitting: Urgent Care

## 2022-11-12 DIAGNOSIS — L299 Pruritus, unspecified: Secondary | ICD-10-CM

## 2022-11-12 DIAGNOSIS — R053 Chronic cough: Secondary | ICD-10-CM | POA: Diagnosis not present

## 2022-11-12 DIAGNOSIS — J309 Allergic rhinitis, unspecified: Secondary | ICD-10-CM

## 2022-11-12 DIAGNOSIS — J020 Streptococcal pharyngitis: Secondary | ICD-10-CM

## 2022-11-12 DIAGNOSIS — T7849XA Other allergy, initial encounter: Secondary | ICD-10-CM

## 2022-11-12 DIAGNOSIS — J4530 Mild persistent asthma, uncomplicated: Secondary | ICD-10-CM | POA: Diagnosis not present

## 2022-11-12 MED ORDER — PREDNISONE 50 MG PO TABS: 50.0000 mg | ORAL_TABLET | Freq: Every day | ORAL | 0 refills | Status: DC

## 2022-11-12 MED ORDER — CETIRIZINE HCL 10 MG PO TABS: 10.0000 mg | ORAL_TABLET | Freq: Every day | ORAL | 0 refills | Status: DC

## 2022-11-12 MED ORDER — PROMETHAZINE-DM 6.25-15 MG/5ML PO SYRP: 5.0000 mL | ORAL_SOLUTION | Freq: Three times a day (TID) | ORAL | 0 refills | Status: DC | PRN

## 2022-11-12 NOTE — ED Triage Notes (Signed)
Pt reports strep throat diagnosis on Thursday, today is the last day of the antibiotics but symptoms haven't improved. She is still congested and has a cough and left ear clogged. She had a bad reaction from the hair dye she recently had done. She reports itching all over x 1 week.

## 2022-11-13 NOTE — ED Provider Notes (Signed)
[facility]  Note:  This document was prepared using Systems analyst and may include unintentional dictation errors.  MRN: [id number] DOB: August 30, 1961  Subjective:   Rachel Rivas is a 61 y.o. female presenting for recheck regarding ongoing coughing, sinus congestion and left ear fullness.  Patient has also been itching consistently even since before starting the amoxicillin she received for strep throat.  Results were positive on chart review.  She has been on this and is still taking the amoxicillin.  Patient primarily is experiencing itching of her scalp, arms and posterior neck.  This is related to hair dye that was applied recently with her hairstylist.  No facial swelling, or swelling.  Initially she did have hives but has been consistently taking antihistamine medications.  She also has a history of asthma.  Feels some chest tightness and wheezing.  No current facility-administered medications for this encounter.  Current Outpatient Medications:    cetirizine (ZYRTEC ALLERGY) 10 MG tablet, Take 1 tablet (10 mg total) by mouth daily., Disp: 90 tablet, Rfl: 0   predniSONE (DELTASONE) 50 MG tablet, Take 1 tablet (50 mg total) by mouth daily with breakfast., Disp: 5 tablet, Rfl: 0   promethazine-dextromethorphan (PROMETHAZINE-DM) 6.25-15 MG/5ML syrup, Take 5 mLs by mouth 3 (three) times daily as needed for cough., Disp: 100 mL, Rfl: 0   albuterol (VENTOLIN HFA) 108 (90 Base) MCG/ACT inhaler, Inhale 2 puffs into the lungs every 6 (six) hours as needed for wheezing or shortness of breath., Disp: 8 g, Rfl: 2   amoxicillin (AMOXIL) 500 MG capsule, Take 2 capsules (1,000 mg total) by mouth 2 (two) times daily., Disp: 40 capsule, Rfl: 0   Coenzyme Q10 (COQ10 PO), Take 2 each by mouth daily., Disp: , Rfl:    cyclobenzaprine (FLEXERIL) 10 MG tablet, Take 1 tablet (10 mg total) by mouth at bedtime as needed for muscle spasms., Disp: 15 tablet, Rfl: 0    diclofenac Sodium (VOLTAREN) 1 % GEL, Apply topically., Disp: , Rfl:    famotidine (PEPCID) 20 MG tablet, TAKE 1 TABLET BY MOUTH TWICE DAILY FOR HEART, Disp: 180 tablet, Rfl: 1   losartan-hydrochlorothiazide (HYZAAR) 50-12.5 MG tablet, Take 1 tablet by mouth once daily, Disp: 90 tablet, Rfl: 1   Multiple Vitamin (MULTIVITAMIN ADULT PO), Take by mouth., Disp: , Rfl:    rosuvastatin (CRESTOR) 5 MG tablet, Take 1 tablet (5 mg total) by mouth daily., Disp: 30 tablet, Rfl: 6   Semaglutide,0.25 or 0.'5MG'$ /DOS, (OZEMPIC, 0.25 OR 0.5 MG/DOSE,) 2 MG/3ML SOPN, Inject 0.5 mg into the skin once a week., Disp: 3 mL, Rfl: 11   VITAMIN D PO, Take by mouth., Disp: , Rfl:    Allergies  Allergen Reactions   Latex Itching, Dermatitis and Rash   Sulfa Antibiotics Nausea And Vomiting   Elemental Sulfur Nausea Only    Past Medical History:  Diagnosis Date   Allergy    occasionally   Anemia    yrs ago, low platelets   Arthritis    RA   Back pain    Blood transfusion without reported diagnosis    1985 with c section   Borderline diabetic    Cancer ([place]) 2021   Uterine   Diabetes mellitus without complication (HCC)    Dyspnea    While taking Megace   GERD (gastroesophageal reflux disease)    Hypertension    Joint pain    Obstructive sleep apnea syndrome 01/07/2020   Prediabetes    Rheumatoid  arthritis ([place])    Sciatica    Sleep apnea    uses CPAP   SOB (shortness of breath)    Thrombocytopenia ([place])      Past Surgical History:  Procedure Laterality Date   CESAREAN SECTION     COLONOSCOPY  02/22/2016   Dr.Armbruster   IR IMAGING GUIDED PORT INSERTION  05/22/2020   IR REMOVAL TUN ACCESS W/ PORT W/O FL MOD SED  04/30/2021   MYOMECTOMY N/A 04/07/2020   Procedure: VAGINAL MYOMECTOMY;  Surgeon: Princess Bruins, MD;  Location: [place];  Service: Gynecology;  Laterality: N/A;   POLYPECTOMY     ROBOTIC ASSISTED TOTAL HYSTERECTOMY WITH BILATERAL SALPINGO OOPHERECTOMY Bilateral  04/07/2020   Procedure: XI ROBOTIC TOTAL LAPAROSCOPIC HYSTERECTOMY, BILATERAL SALPINGO OOPHORECTOMY;  Surgeon: Princess Bruins, MD;  Location: [place];  Service: Gynecology;  Laterality: Bilateral;   STERILIZATION     TUBAL LIGATION      Family History  Problem Relation Age of Onset   Lung cancer Mother    Hypertension Mother    Diabetes Mother    Uterine cancer Mother    Cancer Mother        endometrial   Obesity Mother    Hypertension Father    Diabetes Father    Stroke Father    Hyperlipidemia Father    Obesity Father    Colon cancer Neg Hx    Colon polyps Neg Hx    Esophageal cancer Neg Hx    Rectal cancer Neg Hx    Stomach cancer Neg Hx     Social History   Tobacco Use   Smoking status: Former    Packs/day: 0.25    Years: 12.00    Total pack years: 3.00    Types: Cigarettes   Smokeless tobacco: Never  Vaping Use   Vaping Use: Never used  Substance Use Topics   Alcohol use: Yes    Comment: occasional-wine   Drug use: No    Comment: past; marijuana    ROS   Objective:   Vitals: BP 122/75 (BP Location: Left Arm)   Pulse 67   Temp 98 F (36.7 C) (Oral)   Resp 20   LMP 11/17/2012   SpO2 97%   Physical Exam Constitutional:      General: She is not in acute distress.    Appearance: Normal appearance. She is well-developed and normal weight. She is not ill-appearing, toxic-appearing or diaphoretic.  HENT:     Head: Normocephalic and atraumatic.     Right Ear: Tympanic membrane, ear canal and external ear normal. No drainage or tenderness. No middle ear effusion. There is no impacted cerumen. Tympanic membrane is not erythematous or bulging.     Left Ear: Tympanic membrane, ear canal and external ear normal. No drainage or tenderness.  No middle ear effusion. There is no impacted cerumen. Tympanic membrane is not erythematous or bulging.     Nose: Congestion present. No rhinorrhea.     Mouth/Throat:     Mouth: Mucous membranes are  moist. No oral lesions.     Pharynx: No pharyngeal swelling, oropharyngeal exudate, posterior oropharyngeal erythema or uvula swelling.     Tonsils: No tonsillar exudate or tonsillar abscesses.  Eyes:     General: No scleral icterus.       Right eye: No discharge.        Left eye: No discharge.     Extraocular Movements: Extraocular movements intact.     Right eye:  Normal extraocular motion.     Left eye: Normal extraocular motion.     Conjunctiva/sclera: Conjunctivae normal.  Cardiovascular:     Rate and Rhythm: Normal rate and regular rhythm.     Heart sounds: Normal heart sounds. No murmur heard.    No friction rub. No gallop.  Pulmonary:     Effort: Pulmonary effort is normal. No respiratory distress.     Breath sounds: No stridor. No wheezing, rhonchi or rales.  Chest:     Chest wall: No tenderness.  Musculoskeletal:     Cervical back: Normal range of motion and neck supple.  Lymphadenopathy:     Cervical: No cervical adenopathy.  Skin:    General: Skin is warm and dry.     Findings: No rash.     Comments: No signs of urticaria on exam the patient is notably scratching around the scalp consistently throughout her visit.  Neurological:     General: No focal deficit present.     Mental Status: She is alert and oriented to person, place, and time.  Psychiatric:        Mood and Affect: Mood normal.        Behavior: Behavior normal.       Assessment and Plan :   PDMP not reviewed this encounter.  1. Allergic reaction to hair dye   2. Persistent cough   3. Mild persistent asthma without complication   4. Allergic rhinitis, unspecified seasonality, unspecified trigger   5. Pruritus   6. Strep pharyngitis     Finish out the amoxicillin.  Will supplement her treatment for an allergic reaction to the hair dye with prednisone.  I suspect this would help her with her other respiratory symptoms in the context of her asthma and allergic rhinitis.  Advised that she contact  the manufacturer of the hair dye to get suggestions on complete removal.  Use antihistamine medication otherwise, supportive care. Deferred imaging given clear cardiopulmonary exam, hemodynamically stable vital signs. Counseled patient on potential for adverse effects with medications prescribed/recommended today, ER and return-to-clinic precautions discussed, patient verbalized understanding.    Jaynee Eagles, [place] 11/13/22 [phone]

## 2022-11-19 DIAGNOSIS — G4733 Obstructive sleep apnea (adult) (pediatric): Secondary | ICD-10-CM | POA: Diagnosis not present

## 2022-11-19 DIAGNOSIS — I10 Essential (primary) hypertension: Secondary | ICD-10-CM | POA: Diagnosis not present

## 2022-11-20 DIAGNOSIS — R112 Nausea with vomiting, unspecified: Secondary | ICD-10-CM | POA: Diagnosis not present

## 2022-11-20 DIAGNOSIS — R9431 Abnormal electrocardiogram [ECG] [EKG]: Secondary | ICD-10-CM | POA: Diagnosis not present

## 2022-11-20 DIAGNOSIS — R1011 Right upper quadrant pain: Secondary | ICD-10-CM | POA: Diagnosis not present

## 2022-11-20 DIAGNOSIS — E119 Type 2 diabetes mellitus without complications: Secondary | ICD-10-CM | POA: Diagnosis not present

## 2022-11-20 DIAGNOSIS — I10 Essential (primary) hypertension: Secondary | ICD-10-CM | POA: Diagnosis not present

## 2022-11-20 DIAGNOSIS — R1013 Epigastric pain: Secondary | ICD-10-CM | POA: Diagnosis not present

## 2022-11-20 DIAGNOSIS — Z882 Allergy status to sulfonamides status: Secondary | ICD-10-CM | POA: Diagnosis not present

## 2022-11-20 DIAGNOSIS — K8020 Calculus of gallbladder without cholecystitis without obstruction: Secondary | ICD-10-CM | POA: Diagnosis not present

## 2022-11-20 DIAGNOSIS — Z79899 Other long term (current) drug therapy: Secondary | ICD-10-CM | POA: Diagnosis not present

## 2022-11-20 DIAGNOSIS — Z8542 Personal history of malignant neoplasm of other parts of uterus: Secondary | ICD-10-CM | POA: Diagnosis not present

## 2022-11-20 DIAGNOSIS — Z91040 Latex allergy status: Secondary | ICD-10-CM | POA: Diagnosis not present

## 2022-11-20 DIAGNOSIS — R1012 Left upper quadrant pain: Secondary | ICD-10-CM | POA: Diagnosis not present

## 2022-11-20 DIAGNOSIS — E7800 Pure hypercholesterolemia, unspecified: Secondary | ICD-10-CM | POA: Diagnosis not present

## 2022-11-21 DIAGNOSIS — K8020 Calculus of gallbladder without cholecystitis without obstruction: Secondary | ICD-10-CM | POA: Diagnosis not present

## 2022-11-21 DIAGNOSIS — E119 Type 2 diabetes mellitus without complications: Secondary | ICD-10-CM | POA: Diagnosis not present

## 2022-11-21 DIAGNOSIS — Z91040 Latex allergy status: Secondary | ICD-10-CM | POA: Diagnosis not present

## 2022-11-21 DIAGNOSIS — I10 Essential (primary) hypertension: Secondary | ICD-10-CM | POA: Diagnosis not present

## 2022-11-21 DIAGNOSIS — Z8542 Personal history of malignant neoplasm of other parts of uterus: Secondary | ICD-10-CM | POA: Diagnosis not present

## 2022-11-21 DIAGNOSIS — E7800 Pure hypercholesterolemia, unspecified: Secondary | ICD-10-CM | POA: Diagnosis not present

## 2022-11-21 DIAGNOSIS — R112 Nausea with vomiting, unspecified: Secondary | ICD-10-CM | POA: Diagnosis not present

## 2022-11-21 DIAGNOSIS — Z882 Allergy status to sulfonamides status: Secondary | ICD-10-CM | POA: Diagnosis not present

## 2022-11-21 DIAGNOSIS — Z79899 Other long term (current) drug therapy: Secondary | ICD-10-CM | POA: Diagnosis not present

## 2022-11-21 DIAGNOSIS — R1013 Epigastric pain: Secondary | ICD-10-CM | POA: Diagnosis not present

## 2022-11-21 DIAGNOSIS — R1011 Right upper quadrant pain: Secondary | ICD-10-CM | POA: Diagnosis not present

## 2022-11-21 DIAGNOSIS — R1012 Left upper quadrant pain: Secondary | ICD-10-CM | POA: Diagnosis not present

## 2022-11-22 ENCOUNTER — Encounter: Payer: Self-pay | Admitting: Family Medicine

## 2022-11-22 DIAGNOSIS — K829 Disease of gallbladder, unspecified: Secondary | ICD-10-CM | POA: Diagnosis not present

## 2022-11-22 DIAGNOSIS — Z6834 Body mass index (BMI) 34.0-34.9, adult: Secondary | ICD-10-CM | POA: Diagnosis not present

## 2022-11-22 DIAGNOSIS — E6609 Other obesity due to excess calories: Secondary | ICD-10-CM | POA: Diagnosis not present

## 2022-11-23 DIAGNOSIS — Z90710 Acquired absence of both cervix and uterus: Secondary | ICD-10-CM | POA: Diagnosis not present

## 2022-11-23 DIAGNOSIS — R109 Unspecified abdominal pain: Secondary | ICD-10-CM | POA: Diagnosis not present

## 2022-11-23 DIAGNOSIS — K8000 Calculus of gallbladder with acute cholecystitis without obstruction: Secondary | ICD-10-CM | POA: Diagnosis not present

## 2022-11-23 DIAGNOSIS — Z882 Allergy status to sulfonamides status: Secondary | ICD-10-CM | POA: Diagnosis not present

## 2022-11-23 DIAGNOSIS — E860 Dehydration: Secondary | ICD-10-CM | POA: Diagnosis not present

## 2022-11-23 DIAGNOSIS — K82A1 Gangrene of gallbladder in cholecystitis: Secondary | ICD-10-CM | POA: Diagnosis not present

## 2022-11-23 DIAGNOSIS — Z79899 Other long term (current) drug therapy: Secondary | ICD-10-CM | POA: Diagnosis not present

## 2022-11-23 DIAGNOSIS — K829 Disease of gallbladder, unspecified: Secondary | ICD-10-CM | POA: Diagnosis not present

## 2022-11-23 DIAGNOSIS — Z91040 Latex allergy status: Secondary | ICD-10-CM | POA: Diagnosis not present

## 2022-11-23 DIAGNOSIS — I10 Essential (primary) hypertension: Secondary | ICD-10-CM | POA: Diagnosis not present

## 2022-11-23 DIAGNOSIS — E119 Type 2 diabetes mellitus without complications: Secondary | ICD-10-CM | POA: Diagnosis not present

## 2022-11-23 DIAGNOSIS — K8012 Calculus of gallbladder with acute and chronic cholecystitis without obstruction: Secondary | ICD-10-CM | POA: Diagnosis not present

## 2022-11-23 DIAGNOSIS — D693 Immune thrombocytopenic purpura: Secondary | ICD-10-CM | POA: Diagnosis not present

## 2022-11-23 DIAGNOSIS — Z9221 Personal history of antineoplastic chemotherapy: Secondary | ICD-10-CM | POA: Diagnosis not present

## 2022-11-23 DIAGNOSIS — K812 Acute cholecystitis with chronic cholecystitis: Secondary | ICD-10-CM | POA: Diagnosis not present

## 2022-11-23 DIAGNOSIS — Z8542 Personal history of malignant neoplasm of other parts of uterus: Secondary | ICD-10-CM | POA: Diagnosis not present

## 2022-11-23 DIAGNOSIS — E7800 Pure hypercholesterolemia, unspecified: Secondary | ICD-10-CM | POA: Diagnosis not present

## 2022-11-24 DIAGNOSIS — K8012 Calculus of gallbladder with acute and chronic cholecystitis without obstruction: Secondary | ICD-10-CM | POA: Diagnosis not present

## 2022-11-24 DIAGNOSIS — Z8542 Personal history of malignant neoplasm of other parts of uterus: Secondary | ICD-10-CM | POA: Diagnosis not present

## 2022-11-24 DIAGNOSIS — E860 Dehydration: Secondary | ICD-10-CM | POA: Diagnosis not present

## 2022-11-24 DIAGNOSIS — R109 Unspecified abdominal pain: Secondary | ICD-10-CM | POA: Diagnosis not present

## 2022-11-24 DIAGNOSIS — I10 Essential (primary) hypertension: Secondary | ICD-10-CM | POA: Diagnosis not present

## 2022-11-24 DIAGNOSIS — Z79899 Other long term (current) drug therapy: Secondary | ICD-10-CM | POA: Diagnosis not present

## 2022-11-24 DIAGNOSIS — K829 Disease of gallbladder, unspecified: Secondary | ICD-10-CM | POA: Diagnosis not present

## 2022-11-24 DIAGNOSIS — K8000 Calculus of gallbladder with acute cholecystitis without obstruction: Secondary | ICD-10-CM | POA: Diagnosis not present

## 2022-11-24 DIAGNOSIS — Z91040 Latex allergy status: Secondary | ICD-10-CM | POA: Diagnosis not present

## 2022-11-24 DIAGNOSIS — E119 Type 2 diabetes mellitus without complications: Secondary | ICD-10-CM | POA: Diagnosis not present

## 2022-11-24 DIAGNOSIS — Z882 Allergy status to sulfonamides status: Secondary | ICD-10-CM | POA: Diagnosis not present

## 2022-11-24 DIAGNOSIS — K82A1 Gangrene of gallbladder in cholecystitis: Secondary | ICD-10-CM | POA: Diagnosis not present

## 2022-11-24 DIAGNOSIS — E7800 Pure hypercholesterolemia, unspecified: Secondary | ICD-10-CM | POA: Diagnosis not present

## 2022-11-24 DIAGNOSIS — Z90710 Acquired absence of both cervix and uterus: Secondary | ICD-10-CM | POA: Diagnosis not present

## 2022-11-24 DIAGNOSIS — D693 Immune thrombocytopenic purpura: Secondary | ICD-10-CM | POA: Diagnosis not present

## 2022-11-24 DIAGNOSIS — Z9221 Personal history of antineoplastic chemotherapy: Secondary | ICD-10-CM | POA: Diagnosis not present

## 2022-11-24 DIAGNOSIS — K660 Peritoneal adhesions (postprocedural) (postinfection): Secondary | ICD-10-CM | POA: Diagnosis not present

## 2022-12-05 ENCOUNTER — Encounter: Payer: Self-pay | Admitting: Family Medicine

## 2022-12-07 NOTE — Telephone Encounter (Signed)
Form was sent over and sent to the prior auth team.

## 2022-12-09 ENCOUNTER — Other Ambulatory Visit (HOSPITAL_COMMUNITY): Payer: Self-pay

## 2022-12-12 NOTE — Telephone Encounter (Signed)
Patient called and asked has the medication Ozempic been approve. Call back number [phone].

## 2022-12-13 ENCOUNTER — Other Ambulatory Visit (HOSPITAL_COMMUNITY): Payer: Self-pay

## 2022-12-15 ENCOUNTER — Other Ambulatory Visit (HOSPITAL_COMMUNITY): Payer: Self-pay

## 2022-12-15 ENCOUNTER — Encounter: Payer: Self-pay | Admitting: Family Medicine

## 2022-12-15 NOTE — Telephone Encounter (Signed)
Patient Advocate Encounter   Received notification from [facility] Medicaid that prior authorization for Ozempic '2MG'$ /3ML is required.   PA submitted on 12/15/2022 Key [id number] Status is pending

## 2022-12-16 ENCOUNTER — Encounter: Payer: Self-pay | Admitting: *Deleted

## 2022-12-16 NOTE — Telephone Encounter (Signed)
Rachel Rivas, I went on CoverMyMeds and there is an attached letter stating it was denied. I wasn't sure if you have seen that.

## 2022-12-16 NOTE — Telephone Encounter (Signed)
No I have not seen any denial letter.  Will forward message to Dr. Diona Browner to let her know medication is not covered.

## 2022-12-16 NOTE — Telephone Encounter (Signed)
I received the denial letter from [facility] and it states the patient must file through primary insurance (BSBS [employer]). I submitted a PA on covermymeds with (Key: [id number]) Ozempic '2mg'$ /48m. No authorization is needed through primary. I then called the pharmacy and confirmed that the PA went through as approved, but patients part after insurance coverage is $986. She may have a high deductible plan to meet first. I'm not sure if she can try the Copay savings card through [place] but her Medicaid will not cover.

## 2022-12-17 ENCOUNTER — Other Ambulatory Visit: Payer: Self-pay | Admitting: Cardiology

## 2022-12-17 DIAGNOSIS — I2510 Atherosclerotic heart disease of native coronary artery without angina pectoris: Secondary | ICD-10-CM

## 2022-12-19 NOTE — Telephone Encounter (Signed)
Rx(s) sent to pharmacy electronically.  

## 2022-12-20 ENCOUNTER — Other Ambulatory Visit (HOSPITAL_COMMUNITY): Payer: Self-pay

## 2022-12-22 DIAGNOSIS — J0101 Acute recurrent maxillary sinusitis: Secondary | ICD-10-CM | POA: Diagnosis not present

## 2022-12-22 DIAGNOSIS — H9012 Conductive hearing loss, unilateral, left ear, with unrestricted hearing on the contralateral side: Secondary | ICD-10-CM | POA: Diagnosis not present

## 2022-12-22 DIAGNOSIS — H6522 Chronic serous otitis media, left ear: Secondary | ICD-10-CM | POA: Diagnosis not present

## 2022-12-22 DIAGNOSIS — J343 Hypertrophy of nasal turbinates: Secondary | ICD-10-CM | POA: Diagnosis not present

## 2022-12-29 ENCOUNTER — Other Ambulatory Visit (HOSPITAL_COMMUNITY): Payer: Self-pay

## 2022-12-29 NOTE — Telephone Encounter (Signed)
Per alternate encounter:

## 2023-01-04 ENCOUNTER — Inpatient Hospital Stay: Payer: BC Managed Care – PPO | Attending: Hematology and Oncology | Admitting: Obstetrics & Gynecology

## 2023-01-04 ENCOUNTER — Other Ambulatory Visit: Payer: Self-pay

## 2023-01-04 ENCOUNTER — Encounter: Payer: Self-pay | Admitting: Obstetrics & Gynecology

## 2023-01-04 VITALS — BP 136/69 | HR 62 | Temp 98.0°F | Resp 16 | Ht 63.0 in | Wt 200.8 lb

## 2023-01-04 DIAGNOSIS — Z8542 Personal history of malignant neoplasm of other parts of uterus: Secondary | ICD-10-CM | POA: Diagnosis not present

## 2023-01-04 DIAGNOSIS — Z9221 Personal history of antineoplastic chemotherapy: Secondary | ICD-10-CM | POA: Insufficient documentation

## 2023-01-04 DIAGNOSIS — C549 Malignant neoplasm of corpus uteri, unspecified: Secondary | ICD-10-CM

## 2023-01-04 DIAGNOSIS — Z90710 Acquired absence of both cervix and uterus: Secondary | ICD-10-CM | POA: Diagnosis not present

## 2023-01-04 NOTE — Progress Notes (Signed)
Follow Up Note: Gyn-Onc  Rachel Rivas 62 y.o. female  CC: She presents for a f/u visit   HPI: The oncology history was reviewed.  Interval History: She denies any vaginal bleeding, abdominal/pelvic pain, cough, lethargy or increasing abdominal girth. She was recently had a virtual visit with Dr. Alvy Bimler.  CT findings were reviewed and did not show evidence of  residual disease. In the interim she was diagnosed with acute cholecystitis and who be strep throat.  She has had protracted courses of antibiotics with secondary diarrhea.  She is status post a recent cholecystectomy.  Review of Systems  Review of Systems  Constitutional:  Negative for malaise/fatigue and weight loss.  Respiratory:  Negative for shortness of breath and wheezing.   Cardiovascular:  Negative for chest pain and leg swelling.  Gastrointestinal:  Negative for abdominal pain, blood in stool, constipation, nausea and vomiting. Positive for diarrhea Genitourinary:  Negative for dysuria, frequency, hematuria and urgency.  Musculoskeletal:  Negative for joint pain and myalgias.  Neurological:  Negative for weakness.  Psychiatric/Behavioral:  Negative for depression. The patient does not have insomnia.    Current medications, allergy, social history, past surgical history, past medical history, family history were all reviewed.    Vitals:  BP 136/69 (BP Location: Left Arm, Patient Position: Sitting)   Pulse 62   Temp 98 F (36.7 C) (Oral)   Resp 16   Ht 5' 3"$  (1.6 m)   Wt 200 lb 12.8 oz (91.1 kg)   LMP 11/17/2012   SpO2 96%   BMI 35.57 kg/m    Physical Exam:  Physical Exam Exam conducted with a chaperone present.  Constitutional:      General: She is not in acute distress. Cardiovascular:     Rate and Rhythm: Normal rate and regular rhythm.  Pulmonary:     Effort: Pulmonary effort is normal.     Breath sounds: Normal breath sounds. No wheezing or rhonchi.  Abdominal:     Palpations: Abdomen is soft.      Tenderness: There is no abdominal tenderness. There is no right CVA tenderness or left CVA tenderness.     Hernia: No hernia is present.  Genitourinary:    General: Normal vulva.     Urethra: No urethral lesion.     Vagina: No lesions. No bleeding Musculoskeletal:     Cervical back: Neck supple.     Right lower leg: No edema.     Left lower leg: No edema.  Lymphadenopathy:     Upper Body:     Right upper body: No supraclavicular adenopathy.     Left upper body: No supraclavicular adenopathy.     Lower Body: No right inguinal adenopathy. No left inguinal adenopathy.  Skin:    Findings: No rash.  Neurological:     Mental Status: She is oriented to person, place, and time.   Assessment/Plan: No problem-specific Assessment & Plan notes found for this encounter. 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  S/p adjuvant chemotherapy with 6 cycles of carboplatin and paclitaxel completed October, 2021. Negative symptom review, normal exam.  No evidence of recurrence   >recommend q 6 monthly surveillance examinations with symptom review, physical exam until October 2026  >follow up w/Dr. Alvy Bimler in 12/24; return in 1 yr    I personally spent 25 minutes face-to-face and non-face-to-face in the care of this patient, which includes all  pre, intra, and post visit time on the date of service.   Lahoma Crocker, MD

## 2023-01-04 NOTE — Patient Instructions (Signed)
Return in 1 year ?

## 2023-01-04 NOTE — Assessment & Plan Note (Addendum)
62 yo w/incidental diagnosis of stage IB adenosarcoma of the uterus removed with hysterectomy by Dr Dellis Filbert on 04/07/20.  S/p adjuvant chemotherapy with 6 cycles of carboplatin and paclitaxel completed October, 2021. Negative symptom review, normal exam.  No evidence of recurrence   >recommend q 6 monthly surveillance examinations with symptom review, physical exam until October 2026  >follow up w/Dr. Alvy Bimler in 12/24; return in 1 yr

## 2023-01-12 ENCOUNTER — Telehealth: Payer: Self-pay | Admitting: Family Medicine

## 2023-01-12 DIAGNOSIS — E1169 Type 2 diabetes mellitus with other specified complication: Secondary | ICD-10-CM

## 2023-01-12 DIAGNOSIS — D6481 Anemia due to antineoplastic chemotherapy: Secondary | ICD-10-CM

## 2023-01-12 NOTE — Telephone Encounter (Signed)
-----   Message from Velna Hatchet, RT sent at 12/27/2022  9:04 AM EST ----- Regarding: Fri 2/23 lab Patient is scheduled for cpx, please order future labs.  Thanks, Anda Kraft

## 2023-01-13 ENCOUNTER — Encounter: Payer: BC Managed Care – PPO | Admitting: Family Medicine

## 2023-01-13 ENCOUNTER — Other Ambulatory Visit (INDEPENDENT_AMBULATORY_CARE_PROVIDER_SITE_OTHER): Payer: BC Managed Care – PPO

## 2023-01-13 DIAGNOSIS — E1169 Type 2 diabetes mellitus with other specified complication: Secondary | ICD-10-CM | POA: Diagnosis not present

## 2023-01-13 LAB — COMPREHENSIVE METABOLIC PANEL
ALT: 27 U/L (ref 0–35)
AST: 20 U/L (ref 0–37)
Albumin: 4.3 g/dL (ref 3.5–5.2)
Alkaline Phosphatase: 88 U/L (ref 39–117)
BUN: 20 mg/dL (ref 6–23)
CO2: 28 mEq/L (ref 19–32)
Calcium: 9.5 mg/dL (ref 8.4–10.5)
Chloride: 105 mEq/L (ref 96–112)
Creatinine, Ser: 0.81 mg/dL (ref 0.40–1.20)
GFR: 77.36 mL/min (ref >60.00)
Glucose, Bld: 130 mg/dL — ABNORMAL HIGH (ref 70–99)
Potassium: 3.7 mEq/L (ref 3.5–5.1)
Sodium: 141 mEq/L (ref 135–145)
Total Bilirubin: 0.6 mg/dL (ref 0.2–1.2)
Total Protein: 7.2 g/dL (ref 6.0–8.3)

## 2023-01-13 LAB — MICROALBUMIN / CREATININE URINE RATIO
Creatinine,U: 227.7 mg/dL
Microalb Creat Ratio: 0.7 mg/g (ref 0.0–30.0)
Microalb, Ur: 1.5 mg/dL (ref 0.0–1.9)

## 2023-01-13 LAB — LIPID PANEL
Cholesterol: 166 mg/dL (ref 0–200)
HDL: 75.1 mg/dL (ref >39.00)
LDL Cholesterol: 74 mg/dL (ref 0–99)
NonHDL: 90.92
Total CHOL/HDL Ratio: 2
Triglycerides: 84 mg/dL (ref 0.0–149.0)
VLDL: 16.8 mg/dL (ref 0.0–40.0)

## 2023-01-13 LAB — HEMOGLOBIN A1C: Hgb A1c MFr Bld: 6.2 % (ref 4.6–6.5)

## 2023-01-13 NOTE — Progress Notes (Signed)
No critical labs need to be addressed urgently. We will discuss labs in detail at upcoming office visit.   

## 2023-01-20 ENCOUNTER — Encounter: Payer: Self-pay | Admitting: Family Medicine

## 2023-01-20 ENCOUNTER — Ambulatory Visit (INDEPENDENT_AMBULATORY_CARE_PROVIDER_SITE_OTHER): Payer: BC Managed Care – PPO | Admitting: Family Medicine

## 2023-01-20 VITALS — BP 120/62 | HR 63 | Temp 98.6°F | Ht 64.0 in | Wt 201.0 lb

## 2023-01-20 DIAGNOSIS — D696 Thrombocytopenia, unspecified: Secondary | ICD-10-CM

## 2023-01-20 DIAGNOSIS — Z0000 Encounter for general adult medical examination without abnormal findings: Secondary | ICD-10-CM | POA: Diagnosis not present

## 2023-01-20 DIAGNOSIS — E1169 Type 2 diabetes mellitus with other specified complication: Secondary | ICD-10-CM | POA: Diagnosis not present

## 2023-01-20 DIAGNOSIS — E785 Hyperlipidemia, unspecified: Secondary | ICD-10-CM

## 2023-01-20 DIAGNOSIS — E1159 Type 2 diabetes mellitus with other circulatory complications: Secondary | ICD-10-CM | POA: Diagnosis not present

## 2023-01-20 DIAGNOSIS — M069 Rheumatoid arthritis, unspecified: Secondary | ICD-10-CM

## 2023-01-20 DIAGNOSIS — E2839 Other primary ovarian failure: Secondary | ICD-10-CM

## 2023-01-20 DIAGNOSIS — I152 Hypertension secondary to endocrine disorders: Secondary | ICD-10-CM

## 2023-01-20 LAB — HM DIABETES FOOT EXAM

## 2023-01-20 MED ORDER — TIRZEPATIDE 2.5 MG/0.5ML ~~LOC~~ SOAJ: 2.5000 mg | SUBCUTANEOUS | 11 refills | Status: DC

## 2023-01-20 NOTE — Assessment & Plan Note (Signed)
Chronic, stable control followed by rheumatology

## 2023-01-20 NOTE — Assessment & Plan Note (Addendum)
Her chronic ITP is stable

## 2023-01-20 NOTE — Assessment & Plan Note (Signed)
Chronic, excellent control with LDL at goal on Crestor 5 mg p.o. daily

## 2023-01-20 NOTE — Assessment & Plan Note (Signed)
Encouraged exercise, weight loss, healthy eating habits. ? ?

## 2023-01-20 NOTE — Progress Notes (Signed)
Patient ID: Rachel Rivas, female    DOB: 1961-08-28, 62 y.o.   MRN: [id number]  This visit was conducted in person.  BP 120/62   Pulse 63   Temp 98.6 F (37 C) (Temporal)   Ht '5\' 4"'$  (1.626 m)   Wt 201 lb (91.2 kg)   LMP 11/17/2012   SpO2 96%   BMI 34.50 kg/m    CC: Chief Complaint  Patient presents with   Employment Physical     Subjective:   HPI: Rachel Rivas is a 62 y.o. female presenting on 01/20/2023 for annual exam.  HX of Endometrial cancer: Followed by oncology Dr. Alvy Bimler. S/P hysterectomy and adjuvant chemotherapy.  Hypertension:  Well controlled on losartan HCTZ 50/12.5 mg daily BP Readings from Last 3 Encounters:  01/20/23 120/62  01/04/23 136/69  11/12/22 122/75  Using medication without problems or lightheadedness:  none Chest pain with exertion: none Edema:none Short of breath: none Average home BPs: Other issues:  S/P cholecystectomy.. 11/23/22.  Diabetes: Well-controlled when taking semaglutide,  insurance not covering ozempic ( has been off since 11/2022) HAs had to use prednisone for  several issues. Lab Results  Component Value Date   HGBA1C 6.2 01/13/2023  Using medications without difficulties: Hypoglycemic episodes: Hyperglycemic episodes: Feet problems: no ulcers Blood Sugars averaging: eye exam within last year: done  Wt Readings from Last 3 Encounters:  01/20/23 201 lb (91.2 kg)  01/04/23 200 lb 12.8 oz (91.1 kg)  11/03/22 196 lb 9.6 oz (89.2 kg)     Elevated Cholesterol: LDL at goal < 100 on crestor 5 mg , improved SE on CoQ10 Lab Results  Component Value Date   CHOL 166 01/13/2023   HDL 75.10 01/13/2023   LDLCALC 74 01/13/2023   TRIG 84.0 01/13/2023   CHOLHDL 2 01/13/2023  Using medications without problems: none Muscle aches: none Diet compliance: moderate Exercise: PT for her back Other complaints:    Mild intermittent asthma:  rarely using albuterol inhaler.   Rheumatoid arthritis: she has not restarted  methotrexate, but pain is fairly well controlled. No hand pain.  She plans to return to she rheumatologist as she has not seen her in a while.   Anemia and thrombocytopenia  ( due to autoimmune disease): Plt 66, Hg improved at 11.2 on 11/25/21  Body mass index is 34.5 kg/m. Wt Readings from Last 3 Encounters:  01/20/23 201 lb (91.2 kg)  01/04/23 200 lb 12.8 oz (91.1 kg)  11/03/22 196 lb 9.6 oz (89.2 kg)    Relevant past medical, surgical, family and social history reviewed and updated as indicated. Interim medical history since our last visit reviewed. Allergies and medications reviewed and updated. Outpatient Medications Prior to Visit  Medication Sig Dispense Refill   albuterol (VENTOLIN HFA) 108 (90 Base) MCG/ACT inhaler Inhale 2 puffs into the lungs every 6 (six) hours as needed for wheezing or shortness of breath. 8 g 2   cetirizine (ZYRTEC ALLERGY) 10 MG tablet Take 1 tablet (10 mg total) by mouth daily. 90 tablet 0   Coenzyme Q10 (COQ10 PO) Take 2 each by mouth daily.     cyclobenzaprine (FLEXERIL) 10 MG tablet Take 1 tablet (10 mg total) by mouth at bedtime as needed for muscle spasms. 15 tablet 0   diclofenac Sodium (VOLTAREN) 1 % GEL Apply topically.     famotidine (PEPCID) 20 MG tablet TAKE 1 TABLET BY MOUTH TWICE DAILY FOR HEART 180 tablet 1   losartan-hydrochlorothiazide (HYZAAR) 50-12.5 MG tablet Take 1  tablet by mouth once daily 90 tablet 1   Multiple Vitamin (MULTIVITAMIN ADULT PO) Take by mouth.     rosuvastatin (CRESTOR) 5 MG tablet Take 1 tablet (5 mg total) by mouth daily. NEED APPOINTMENT 90 tablet 0   VITAMIN D PO Take by mouth.     No facility-administered medications prior to visit.     Per HPI unless specifically indicated in ROS section below Review of Systems Objective:  BP 120/62   Pulse 63   Temp 98.6 F (37 C) (Temporal)   Ht '5\' 4"'$  (1.626 m)   Wt 201 lb (91.2 kg)   LMP 11/17/2012   SpO2 96%   BMI 34.50 kg/m   Wt Readings from Last 3 Encounters:   01/20/23 201 lb (91.2 kg)  01/04/23 200 lb 12.8 oz (91.1 kg)  11/03/22 196 lb 9.6 oz (89.2 kg)      Physical Exam Vitals and nursing note reviewed.  Constitutional:      General: She is not in acute distress.    Appearance: Normal appearance. She is well-developed. She is not ill-appearing or toxic-appearing.  HENT:     Head: Normocephalic.     Right Ear: Hearing, tympanic membrane, ear canal and external ear normal.     Left Ear: Hearing, tympanic membrane, ear canal and external ear normal.     Nose: Nose normal.  Eyes:     General: Lids are normal. Lids are everted, no foreign bodies appreciated.     Conjunctiva/sclera: Conjunctivae normal.     Pupils: Pupils are equal, round, and reactive to light.  Neck:     Thyroid: No thyroid mass or thyromegaly.     Vascular: No carotid bruit.     Trachea: Trachea normal.  Cardiovascular:     Rate and Rhythm: Normal rate and regular rhythm.     Heart sounds: Normal heart sounds, S1 normal and S2 normal. No murmur heard.    No gallop.  Pulmonary:     Effort: Pulmonary effort is normal. No respiratory distress.     Breath sounds: Normal breath sounds. No wheezing, rhonchi or rales.  Abdominal:     General: Bowel sounds are normal. There is no distension or abdominal bruit.     Palpations: Abdomen is soft. There is no fluid wave or mass.     Tenderness: There is no abdominal tenderness. There is no guarding or rebound.     Hernia: No hernia is present.  Musculoskeletal:     Cervical back: Normal range of motion and neck supple.  Lymphadenopathy:     Cervical: No cervical adenopathy.  Skin:    General: Skin is warm and dry.     Findings: No rash.  Neurological:     Mental Status: She is alert and oriented to person, place, and time.     Cranial Nerves: No cranial nerve deficit.     Sensory: No sensory deficit.     Comments:  Vertigo with leaning back on table, nystagmus.  Psychiatric:        Mood and Affect: Mood is not anxious  or depressed.        Speech: Speech normal.        Behavior: Behavior normal. Behavior is cooperative.        Judgment: Judgment normal.     Diabetic foot exam: Normal inspection No skin breakdown No calluses  Normal DP pulses Normal sensation to light touch and monofilament Nails normal     Results for orders placed  or performed in visit on 01/20/23  HM DIABETES FOOT EXAM  Result Value Ref Range   HM Diabetic Foot Exam done     This visit occurred during the SARS-CoV-2 public health emergency.  Safety protocols were in place, including screening questions prior to the visit, additional usage of staff PPE, and extensive cleaning of exam room while observing appropriate contact time as indicated for disinfecting solutions.   COVID 19 screen:  No recent travel or known exposure to COVID19 The patient denies respiratory symptoms of COVID 19 at this time. The importance of social distancing was discussed today.   Assessment and Plan The patient's preventative maintenance and recommended screening tests for an annual wellness exam were reviewed in full today. Brought up to date unless services declined.  Counselled on the importance of diet, exercise, and its role in overall health and mortality. The patient's FH and SH was reviewed, including their home life, tobacco status, and drug and alcohol status.   Mammo: 01/2022 normal  Colon: 07/07/22 Dr. Havery Moros, repeat in 7 years. Former smoker minimal use, 3 pack years, remote Vaccines: s/p COVID x 3, offered td and flu (refused).. can consider shingrix ( refused)   TAH s/p uterine cancer    Hep C neg  HIV: refused Bone density:  due   Problem List Items Addressed This Visit     Hyperlipidemia associated with type 2 diabetes mellitus ([place])    Chronic, excellent control with LDL at goal on Crestor 5 mg p.o. daily      Relevant Medications   tirzepatide (MOUNJARO) 2.5 MG/0.5ML Pen   Hypertension associated with type 2  diabetes mellitus (HCC)    Chronic, well controlled  Continue losartan/hydrochlorothiazide 50/12.5 mg p.o. daily.      Relevant Medications   tirzepatide [facility]) 2.5 MG/0.5ML Pen   Rheumatoid arthritis (HCC) (Chronic)    Chronic, stable control followed by rheumatology      Severe obesity (BMI 35.0-39.9) with comorbidity ([place])    Encouraged exercise, weight loss, healthy eating habits.       Relevant Medications   tirzepatide [facility]) 2.5 MG/0.5ML Pen   Thrombocytopenia (HCC)    Her chronic ITP is stable       Type 2 diabetes mellitus with other circulatory complications (HCC)    Chronic, Had been doing well with improved diabetes control on semaglutide, per patient insurance is no longer covering this. A1c has worsened intervally.  She has also had to use several courses of prednisone.  We will send in a trial of Mounjaro.  She will continue her aggressive lifestyle changes into including diet and exercise.      Relevant Medications   tirzepatide (MOUNJARO) 2.5 MG/0.5ML Pen   Other Visit Diagnoses     Routine general medical examination at a health care facility    -  Primary   Estrogen deficiency       Relevant Orders   DG Bone Density       Meds ordered this encounter  Medications   tirzepatide (MOUNJARO) 2.5 MG/0.5ML Pen    Sig: Inject 2.5 mg into the skin once a week.    Dispense:  2 mL    Refill:  11     Eliezer Lofts, MD

## 2023-01-20 NOTE — Assessment & Plan Note (Addendum)
Chronic, Had been doing well with improved diabetes control on semaglutide, per patient insurance is no longer covering this. A1c has worsened intervally.  She has also had to use several courses of prednisone.  We will send in a trial of Mounjaro.  She will continue her aggressive lifestyle changes into including diet and exercise.

## 2023-01-20 NOTE — Assessment & Plan Note (Signed)
Chronic, well controlled  Continue losartan/hydrochlorothiazide 50/12.5 mg p.o. daily.

## 2023-01-20 NOTE — Patient Instructions (Signed)
Work on low carb diet, increase exercise as tolerated  Will try to see if Mounjaro covered.Marland Kitchen

## 2023-01-27 DIAGNOSIS — H6982 Other specified disorders of Eustachian tube, left ear: Secondary | ICD-10-CM | POA: Diagnosis not present

## 2023-01-27 DIAGNOSIS — H9012 Conductive hearing loss, unilateral, left ear, with unrestricted hearing on the contralateral side: Secondary | ICD-10-CM | POA: Diagnosis not present

## 2023-01-27 DIAGNOSIS — H6522 Chronic serous otitis media, left ear: Secondary | ICD-10-CM | POA: Diagnosis not present

## 2023-01-27 DIAGNOSIS — J343 Hypertrophy of nasal turbinates: Secondary | ICD-10-CM | POA: Diagnosis not present

## 2023-01-27 DIAGNOSIS — J310 Chronic rhinitis: Secondary | ICD-10-CM | POA: Diagnosis not present

## 2023-02-02 DIAGNOSIS — H9012 Conductive hearing loss, unilateral, left ear, with unrestricted hearing on the contralateral side: Secondary | ICD-10-CM | POA: Diagnosis not present

## 2023-02-02 DIAGNOSIS — H6522 Chronic serous otitis media, left ear: Secondary | ICD-10-CM | POA: Diagnosis not present

## 2023-02-03 ENCOUNTER — Ambulatory Visit (HOSPITAL_BASED_OUTPATIENT_CLINIC_OR_DEPARTMENT_OTHER): Payer: BC Managed Care – PPO | Admitting: Pulmonary Disease

## 2023-02-03 ENCOUNTER — Encounter (HOSPITAL_BASED_OUTPATIENT_CLINIC_OR_DEPARTMENT_OTHER): Payer: Self-pay | Admitting: Pulmonary Disease

## 2023-02-03 VITALS — BP 128/72 | HR 65 | Temp 98.1°F | Ht 64.0 in | Wt 205.0 lb

## 2023-02-03 DIAGNOSIS — J4521 Mild intermittent asthma with (acute) exacerbation: Secondary | ICD-10-CM | POA: Diagnosis not present

## 2023-02-03 DIAGNOSIS — G4733 Obstructive sleep apnea (adult) (pediatric): Secondary | ICD-10-CM | POA: Diagnosis not present

## 2023-02-03 NOTE — Assessment & Plan Note (Signed)
Refills on albuterol as needed 

## 2023-02-03 NOTE — Assessment & Plan Note (Signed)
CPAP download was reviewed which shows excellent control of events on auto CPAP 5 to 15 cm with average pressure of 13.6 and maximum pressure of 15 cm. She is very compliant more than 7 hours per night without a single missed night and has minimal leak. CPAP is only helped improve her daytime somnolence and fatigue  Weight loss encouraged, compliance with goal of at least 4-6 hrs every night is the expectation. Advised against medications with sedative side effects Cautioned against driving when sleepy - understanding that sleepiness will vary on a day to day basis

## 2023-02-03 NOTE — Progress Notes (Signed)
   Subjective:    Patient ID: Rachel Rivas, female    DOB: 10-16-1961, 62 y.o.   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   68-month follow-up visit. CPAP is working well.  She denies any problems with mask or pressure. She had borderline diabetes and prednisone increases her sugar.  Ozempic was not covered on her insurance.  [name] has been prescribed and she is awaiting insurance approval.  She uses albuterol intermittently for wheezing last that she was was in December when she had a sinus infection  Significant tests/ events reviewed   HST 03/2020  mod OSA with AHI 24/ hr  HST 01/2015 - wt 197 pounds -AHI 20/hour  Review of Systems neg for any significant sore throat, dysphagia, itching, sneezing, nasal congestion or excess/ purulent secretions, fever, chills, sweats, unintended wt loss, pleuritic or exertional cp, hempoptysis, orthopnea pnd or change in chronic leg swelling. Also denies presyncope, palpitations, heartburn, abdominal pain, nausea, vomiting, diarrhea or change in bowel or urinary habits, dysuria,hematuria, rash, arthralgias, visual complaints, headache, numbness weakness or ataxia.     Objective:   Physical Exam  Gen. Pleasant, obese, in no distress ENT - no lesions, no post nasal drip Neck: No JVD, no thyromegaly, no carotid bruits Lungs: no use of accessory muscles, no dullness to percussion, decreased without rales or rhonchi  Cardiovascular: Rhythm regular, heart sounds  normal, no murmurs or gallops, no peripheral edema Musculoskeletal: No deformities, no cyanosis or clubbing , no tremors       Assessment & Plan:

## 2023-02-03 NOTE — Patient Instructions (Signed)
CPAP is working well on current settings 

## 2023-02-08 DIAGNOSIS — Z1231 Encounter for screening mammogram for malignant neoplasm of breast: Secondary | ICD-10-CM | POA: Diagnosis not present

## 2023-02-08 LAB — HM MAMMOGRAPHY

## 2023-02-09 NOTE — Progress Notes (Signed)
No critical labs need to be addressed urgently. We will discuss labs in detail at upcoming office visit.   

## 2023-02-14 ENCOUNTER — Encounter: Payer: Self-pay | Admitting: Family Medicine

## 2023-02-14 ENCOUNTER — Encounter (HOSPITAL_BASED_OUTPATIENT_CLINIC_OR_DEPARTMENT_OTHER): Payer: Self-pay

## 2023-02-14 NOTE — Telephone Encounter (Signed)
Handicap Application placed in Dr. Rometta Emery office in box to complete.

## 2023-02-17 ENCOUNTER — Other Ambulatory Visit (HOSPITAL_COMMUNITY): Payer: Self-pay

## 2023-02-17 DIAGNOSIS — Z780 Asymptomatic menopausal state: Secondary | ICD-10-CM | POA: Diagnosis not present

## 2023-02-17 LAB — HM DEXA SCAN: HM Dexa Scan: NORMAL

## 2023-02-20 ENCOUNTER — Encounter: Payer: Self-pay | Admitting: Family Medicine

## 2023-02-21 ENCOUNTER — Other Ambulatory Visit (HOSPITAL_COMMUNITY): Payer: Self-pay

## 2023-02-21 ENCOUNTER — Telehealth: Payer: Self-pay

## 2023-02-21 NOTE — Telephone Encounter (Signed)
Pharmacy Patient Advocate Encounter   Received notification that prior authorization for Mounjaro 2.5MG /0.5ML pen-injectors is required/requested.  Per Test Claim: PA required   PA submitted on 02/21/23 to (ins) [employer] via CoverMyMeds Key or (Medicaid) confirmation # [id number] Status is pending

## 2023-02-23 NOTE — Telephone Encounter (Signed)
Patient Advocate Encounter  Prior Authorization for [facility] 2.5MG /0.5ML pen-injectors  has been approved.    PA# [id number] Effective dates: 02/22/23 through 02/22/24

## 2023-03-23 ENCOUNTER — Ambulatory Visit (HOSPITAL_BASED_OUTPATIENT_CLINIC_OR_DEPARTMENT_OTHER): Payer: BC Managed Care – PPO | Admitting: Cardiology

## 2023-04-07 ENCOUNTER — Encounter: Payer: Self-pay | Admitting: Family Medicine

## 2023-04-11 MED ORDER — TIRZEPATIDE 5 MG/0.5ML ~~LOC~~ SOAJ: 5.0000 mg | SUBCUTANEOUS | 11 refills | Status: DC

## 2023-05-14 ENCOUNTER — Other Ambulatory Visit: Payer: Self-pay | Admitting: Cardiology

## 2023-05-14 DIAGNOSIS — I2510 Atherosclerotic heart disease of native coronary artery without angina pectoris: Secondary | ICD-10-CM

## 2023-05-15 NOTE — Telephone Encounter (Signed)
Rx(s) sent to pharmacy electronically.  

## 2023-05-28 ENCOUNTER — Encounter: Payer: Self-pay | Admitting: Family Medicine

## 2023-06-01 MED ORDER — TIRZEPATIDE 7.5 MG/0.5ML ~~LOC~~ SOAJ: 7.5000 mg | SUBCUTANEOUS | 3 refills | Status: DC

## 2023-06-11 ENCOUNTER — Other Ambulatory Visit: Payer: Self-pay | Admitting: Cardiology

## 2023-06-11 DIAGNOSIS — I2510 Atherosclerotic heart disease of native coronary artery without angina pectoris: Secondary | ICD-10-CM

## 2023-06-12 NOTE — Telephone Encounter (Signed)
Please call pt to schedule overdue follow-up appointment with Dr. Cristal Deer or APP for refills. Thank you! Last OV 02/2022.

## 2023-06-21 NOTE — Telephone Encounter (Signed)
Patient states she is at work and will call us back on Friday 06/23/23 to schedule

## 2023-06-27 NOTE — Telephone Encounter (Signed)
Left message for patient to call and schedule the over due follow up with Dr. Cristal Deer for medication refills

## 2023-06-29 NOTE — Telephone Encounter (Signed)
Rx request sent to pharmacy.  

## 2023-06-29 NOTE — Telephone Encounter (Signed)
Scheduled  09/29/23 with Dr. Cristal Deer

## 2023-07-06 ENCOUNTER — Telehealth: Payer: Self-pay | Admitting: *Deleted

## 2023-07-06 ENCOUNTER — Encounter (INDEPENDENT_AMBULATORY_CARE_PROVIDER_SITE_OTHER): Payer: Self-pay

## 2023-07-06 DIAGNOSIS — E785 Hyperlipidemia, unspecified: Secondary | ICD-10-CM

## 2023-07-06 DIAGNOSIS — E1159 Type 2 diabetes mellitus with other circulatory complications: Secondary | ICD-10-CM

## 2023-07-06 NOTE — Telephone Encounter (Signed)
-----   Message from Alvina Chou sent at 07/06/2023  9:10 AM EDT ----- Regarding: Lab orders for Tuesday, 8.27.24 Lab orders for a 6 month follow up appt

## 2023-07-18 ENCOUNTER — Other Ambulatory Visit (INDEPENDENT_AMBULATORY_CARE_PROVIDER_SITE_OTHER): Payer: BC Managed Care – PPO

## 2023-07-18 DIAGNOSIS — E1159 Type 2 diabetes mellitus with other circulatory complications: Secondary | ICD-10-CM

## 2023-07-18 DIAGNOSIS — E1169 Type 2 diabetes mellitus with other specified complication: Secondary | ICD-10-CM | POA: Diagnosis not present

## 2023-07-18 DIAGNOSIS — E785 Hyperlipidemia, unspecified: Secondary | ICD-10-CM | POA: Diagnosis not present

## 2023-07-18 LAB — COMPREHENSIVE METABOLIC PANEL
ALT: 17 U/L (ref 0–35)
AST: 17 U/L (ref 0–37)
Albumin: 4.2 g/dL (ref 3.5–5.2)
Alkaline Phosphatase: 82 U/L (ref 39–117)
BUN: 17 mg/dL (ref 6–23)
CO2: 31 mEq/L (ref 19–32)
Calcium: 9.7 mg/dL (ref 8.4–10.5)
Chloride: 103 mEq/L (ref 96–112)
Creatinine, Ser: 0.87 mg/dL (ref 0.40–1.20)
GFR: 70.75 mL/min (ref >60.00)
Glucose, Bld: 91 mg/dL (ref 70–99)
Potassium: 3.7 mEq/L (ref 3.5–5.1)
Sodium: 142 mEq/L (ref 135–145)
Total Bilirubin: 0.5 mg/dL (ref 0.2–1.2)
Total Protein: 7.5 g/dL (ref 6.0–8.3)

## 2023-07-18 LAB — LIPID PANEL
Cholesterol: 160 mg/dL (ref 0–200)
HDL: 63.4 mg/dL (ref >39.00)
LDL Cholesterol: 76 mg/dL (ref 0–99)
NonHDL: 96.75
Total CHOL/HDL Ratio: 3
Triglycerides: 104 mg/dL (ref 0.0–149.0)
VLDL: 20.8 mg/dL (ref 0.0–40.0)

## 2023-07-18 LAB — HEMOGLOBIN A1C: Hgb A1c MFr Bld: 5.7 % (ref 4.6–6.5)

## 2023-07-19 NOTE — Progress Notes (Signed)
No critical labs need to be addressed urgently. We will discuss labs in detail at upcoming office visit.   

## 2023-07-25 ENCOUNTER — Ambulatory Visit (INDEPENDENT_AMBULATORY_CARE_PROVIDER_SITE_OTHER): Payer: BC Managed Care – PPO | Admitting: Family Medicine

## 2023-07-25 ENCOUNTER — Encounter: Payer: Self-pay | Admitting: Family Medicine

## 2023-07-25 VITALS — BP 104/60 | HR 65 | Temp 97.6°F | Ht 64.0 in | Wt 196.0 lb

## 2023-07-25 DIAGNOSIS — I152 Hypertension secondary to endocrine disorders: Secondary | ICD-10-CM | POA: Diagnosis not present

## 2023-07-25 DIAGNOSIS — Z7985 Long-term (current) use of injectable non-insulin antidiabetic drugs: Secondary | ICD-10-CM

## 2023-07-25 DIAGNOSIS — K219 Gastro-esophageal reflux disease without esophagitis: Secondary | ICD-10-CM | POA: Diagnosis not present

## 2023-07-25 DIAGNOSIS — E1169 Type 2 diabetes mellitus with other specified complication: Secondary | ICD-10-CM | POA: Diagnosis not present

## 2023-07-25 DIAGNOSIS — E1159 Type 2 diabetes mellitus with other circulatory complications: Secondary | ICD-10-CM | POA: Diagnosis not present

## 2023-07-25 DIAGNOSIS — E785 Hyperlipidemia, unspecified: Secondary | ICD-10-CM

## 2023-07-25 MED ORDER — ALBUTEROL SULFATE HFA 108 (90 BASE) MCG/ACT IN AERS: 2.0000 | INHALATION_SPRAY | Freq: Four times a day (QID) | RESPIRATORY_TRACT | 2 refills | Status: DC | PRN

## 2023-07-25 MED ORDER — LOSARTAN POTASSIUM 50 MG PO TABS: 50.0000 mg | ORAL_TABLET | Freq: Every day | ORAL | 3 refills | Status: DC

## 2023-07-25 MED ORDER — FAMOTIDINE 20 MG PO TABS
ORAL_TABLET | ORAL | 3 refills | Status: AC
Start: 2023-07-25 — End: ?

## 2023-07-25 NOTE — Progress Notes (Signed)
Patient ID: Rachel Rivas, female    DOB: 1960-12-26, 62 y.o.   MRN: [id number]  This visit was conducted in person.  BP 104/60 (BP Location: Left Arm, Patient Position: Sitting, Cuff Size: Large)   Pulse 65   Temp 97.6 F (36.4 C) (Temporal)   Ht 5\' 4"  (1.626 m)   Wt 196 lb (88.9 kg)   LMP 11/17/2012   SpO2 98%   BMI 33.64 kg/m    CC: Chief Complaint  Patient presents with   Diabetes     Subjective:   HPI: Rachel Rivas is a 62 y.o. female presenting on 07/25/2023 for annual exam.  Hypertension:  Well controlled on losartan HCTZ 50/12.5 mg daily BP Readings from Last 3 Encounters:  07/25/23 104/60  02/03/23 128/72  01/20/23 120/62  Using medication without problems or lightheadedness:  Yes frequently in last few Chest pain with exertion: none Edema:none Short of breath:  mild with exertion since COVID.Marland Kitchen using albuterol prn with history of Asthma. Average home BPs: Other issues:   Diabetes: Well-controlled  on  Mounjaro 7.5 mg weekly Lab Results  Component Value Date   HGBA1C 5.7 07/18/2023  Using medications without difficulties: Hypoglycemic episodes: Hyperglycemic episodes: Feet problems: no ulcers Blood Sugars averaging: eye exam within last year: done  She has lost 9 lbs in last 6 months and A1C now at goal on GLP1 Wt Readings from Last 3 Encounters:  07/25/23 196 lb (88.9 kg)  02/03/23 205 lb (93 kg)  01/20/23 201 lb (91.2 kg)     Elevated Cholesterol: LDL at goal < 100 on crestor 5 mg ( 2-3 times a week) , improved SE on CoQ10 Lab Results  Component Value Date   CHOL 160 07/18/2023   HDL 63.40 07/18/2023   LDLCALC 76 07/18/2023   TRIG 104.0 07/18/2023   CHOLHDL 3 07/18/2023  Using medications without problems: none Muscle aches: none Diet compliance: moderate Exercise:  walking some Other complaints:    Relevant past medical, surgical, family and social history reviewed and updated as indicated. Interim medical history since our last  visit reviewed. Allergies and medications reviewed and updated. Outpatient Medications Prior to Visit  Medication Sig Dispense Refill   cetirizine (ZYRTEC ALLERGY) 10 MG tablet Take 1 tablet (10 mg total) by mouth daily. 90 tablet 0   Coenzyme Q10 (COQ10 PO) Take 2 each by mouth daily.     cyclobenzaprine (FLEXERIL) 10 MG tablet Take 1 tablet (10 mg total) by mouth at bedtime as needed for muscle spasms. 15 tablet 0   diclofenac Sodium (VOLTAREN) 1 % GEL Apply topically.     Multiple Vitamin (MULTIVITAMIN ADULT PO) Take by mouth.     rosuvastatin (CRESTOR) 5 MG tablet Take 1 tablet (5 mg total) by mouth daily. Please keep your upcoming appointment for refills. 90 tablet 0   tirzepatide (MOUNJARO) 7.5 MG/0.5ML Pen Inject 7.5 mg into the skin once a week. 6 mL 3   VITAMIN D PO Take by mouth.     albuterol (VENTOLIN HFA) 108 (90 Base) MCG/ACT inhaler Inhale 2 puffs into the lungs every 6 (six) hours as needed for wheezing or shortness of breath. 8 g 2   famotidine (PEPCID) 20 MG tablet TAKE 1 TABLET BY MOUTH TWICE DAILY FOR HEART 180 tablet 1   losartan-hydrochlorothiazide (HYZAAR) 50-12.5 MG tablet Take 1 tablet by mouth once daily 90 tablet 1   No facility-administered medications prior to visit.     Per HPI unless specifically indicated in  ROS section below Review of Systems Objective:  BP 104/60 (BP Location: Left Arm, Patient Position: Sitting, Cuff Size: Large)   Pulse 65   Temp 97.6 F (36.4 C) (Temporal)   Ht 5\' 4"  (1.626 m)   Wt 196 lb (88.9 kg)   LMP 11/17/2012   SpO2 98%   BMI 33.64 kg/m   Wt Readings from Last 3 Encounters:  07/25/23 196 lb (88.9 kg)  02/03/23 205 lb (93 kg)  01/20/23 201 lb (91.2 kg)      Physical Exam Vitals and nursing note reviewed.  Constitutional:      General: She is not in acute distress.    Appearance: Normal appearance. She is well-developed. She is not ill-appearing or toxic-appearing.  HENT:     Head: Normocephalic.     Right Ear:  Hearing, tympanic membrane, ear canal and external ear normal.     Left Ear: Hearing, tympanic membrane, ear canal and external ear normal.     Nose: Nose normal.  Eyes:     General: Lids are normal. Lids are everted, no foreign bodies appreciated.     Conjunctiva/sclera: Conjunctivae normal.     Pupils: Pupils are equal, round, and reactive to light.  Neck:     Thyroid: No thyroid mass or thyromegaly.     Vascular: No carotid bruit.     Trachea: Trachea normal.  Cardiovascular:     Rate and Rhythm: Normal rate and regular rhythm.     Heart sounds: Normal heart sounds, S1 normal and S2 normal. No murmur heard.    No gallop.  Pulmonary:     Effort: Pulmonary effort is normal. No respiratory distress.     Breath sounds: Normal breath sounds. No wheezing, rhonchi or rales.  Abdominal:     General: Bowel sounds are normal. There is no distension or abdominal bruit.     Palpations: Abdomen is soft. There is no fluid wave or mass.     Tenderness: There is no abdominal tenderness. There is no guarding or rebound.     Hernia: No hernia is present.  Musculoskeletal:     Cervical back: Normal range of motion and neck supple.  Lymphadenopathy:     Cervical: No cervical adenopathy.  Skin:    General: Skin is warm and dry.     Findings: No rash.  Neurological:     Mental Status: She is alert and oriented to person, place, and time.     Cranial Nerves: No cranial nerve deficit.     Sensory: No sensory deficit.  Psychiatric:        Mood and Affect: Mood is not anxious or depressed.        Speech: Speech normal.        Behavior: Behavior normal. Behavior is cooperative.        Judgment: Judgment normal.         Results for orders placed or performed in visit on 07/18/23  Lipid panel  Result Value Ref Range   Cholesterol 160 0 - 200 mg/dL   Triglycerides 161.0 0.0 - 149.0 mg/dL   HDL 96.04 >54.09 mg/dL   VLDL 81.1 0.0 - 91.4 mg/dL   LDL Cholesterol 76 0 - 99 mg/dL   Total CHOL/HDL  Ratio 3    NonHDL 96.75   Hemoglobin A1c  Result Value Ref Range   Hgb A1c MFr Bld 5.7 4.6 - 6.5 %  Comprehensive metabolic panel  Result Value Ref Range   Sodium 142 135 -  145 mEq/L   Potassium 3.7 3.5 - 5.1 mEq/L   Chloride 103 96 - 112 mEq/L   CO2 31 19 - 32 mEq/L   Glucose, Bld 91 70 - 99 mg/dL   BUN 17 6 - 23 mg/dL   Creatinine, Ser 0.98 0.40 - 1.20 mg/dL   Total Bilirubin 0.5 0.2 - 1.2 mg/dL   Alkaline Phosphatase 82 39 - 117 U/L   AST 17 0 - 37 U/L   ALT 17 0 - 35 U/L   Total Protein 7.5 6.0 - 8.3 g/dL   Albumin 4.2 3.5 - 5.2 g/dL   GFR 11.91 >47.82 mL/min   Calcium 9.7 8.4 - 10.5 mg/dL    This visit occurred during the SARS-CoV-2 public health emergency.  Safety protocols were in place, including screening questions prior to the visit, additional usage of staff PPE, and extensive cleaning of exam room while observing appropriate contact time as indicated for disinfecting solutions.   COVID 19 screen:  No recent travel or known exposure to COVID19 The patient denies respiratory symptoms of COVID 19 at this time. The importance of social distancing was discussed today.   Assessment and Plan The patient's preventative maintenance and recommended screening tests for an annual wellness exam were reviewed in full today. Brought up to date unless services declined.  Counselled on the importance of diet, exercise, and its role in overall health and mortality. The patient's FH and SH was reviewed, including their home life, tobacco status, and drug and alcohol status.   Mammo: 01/2022 normal  Colon: 07/07/22 Dr. Adela Lank, repeat in 7 years. Former smoker minimal use, 3 pack years, remote Vaccines: s/p COVID x 3, offered td and flu (refused).. can consider shingrix ( refused)   TAH s/p uterine cancer    Hep C neg  HIV: refused Bone density:  due   Problem List Items Addressed This Visit     Gastroesophageal reflux disease    Stable, chronic.  Continue current  medication.  Famotidine twice daily.Marland Kitchen  Refilled      Relevant Medications   famotidine (PEPCID) 20 MG tablet   Hyperlipidemia associated with type 2 diabetes mellitus (HCC)    Chronic, excellent control with LDL at goal on Crestor 5 mg p.o. 2-3 times a week.  She cannot tolerate more frequent dosing even with using CoQ10.      Relevant Medications   losartan (COZAAR) 50 MG tablet   Hypertension associated with type 2 diabetes mellitus (HCC)    Chronic, well-controlled but patient with lightheadedness.  Blood pressure below goal in office today.  Will decrease blood pressure medication strength by removing hydrochlorothiazide component.  Changed to losartan 50 mg p.o. daily.  Follow blood pressure closely, call if lightheadedness continuing.      Relevant Medications   losartan (COZAAR) 50 MG tablet   Type 2 diabetes mellitus with other circulatory complications (HCC) - Primary    Stable, chronic.  Continue current medication.   Excellent improvement in A1c, fasting glucose and weight management with Mounjaro 7.5 mg weekly.      Relevant Medications   losartan (COZAAR) 50 MG tablet    Meds ordered this encounter  Medications   albuterol (VENTOLIN HFA) 108 (90 Base) MCG/ACT inhaler    Sig: Inhale 2 puffs into the lungs every 6 (six) hours as needed for wheezing or shortness of breath.    Dispense:  8 g    Refill:  2    pls dispense generic equivalent if needed  famotidine (PEPCID) 20 MG tablet    Sig: TAKE 1 TABLET BY MOUTH TWICE DAILY FOR HEART    Dispense:  180 tablet    Refill:  3   losartan (COZAAR) 50 MG tablet    Sig: Take 1 tablet (50 mg total) by mouth daily.    Dispense:  90 tablet    Refill:  3     Kerby Nora, MD

## 2023-07-25 NOTE — Assessment & Plan Note (Signed)
Stable, chronic.  Continue current medication.   Excellent improvement in A1c, fasting glucose and weight management with Mounjaro 7.5 mg weekly.

## 2023-07-25 NOTE — Assessment & Plan Note (Signed)
Chronic, excellent control with LDL at goal on Crestor 5 mg p.o. 2-3 times a week.  She cannot tolerate more frequent dosing even with using CoQ10.

## 2023-07-25 NOTE — Assessment & Plan Note (Signed)
Stable, chronic.  Continue current medication.  Famotidine twice daily.Marland Kitchen  Refilled

## 2023-07-25 NOTE — Assessment & Plan Note (Signed)
Chronic, well-controlled but patient with lightheadedness.  Blood pressure below goal in office today.  Will decrease blood pressure medication strength by removing hydrochlorothiazide component.  Changed to losartan 50 mg p.o. daily.  Follow blood pressure closely, call if lightheadedness continuing.

## 2023-07-25 NOTE — Patient Instructions (Addendum)
Change BP medication to losartan alone at 50 mg daily. Stop losartan hydrochlorothiazide combo.  Follow BP .Marland Kitchen Goal > 90/60, but < 140/90.  Keep up  the great work on health eating and regular exercise.

## 2023-07-28 DIAGNOSIS — E119 Type 2 diabetes mellitus without complications: Secondary | ICD-10-CM | POA: Diagnosis not present

## 2023-07-28 DIAGNOSIS — H53143 Visual discomfort, bilateral: Secondary | ICD-10-CM | POA: Diagnosis not present

## 2023-07-28 LAB — HM DIABETES EYE EXAM

## 2023-08-07 ENCOUNTER — Other Ambulatory Visit: Payer: Self-pay | Admitting: Family Medicine

## 2023-08-11 DIAGNOSIS — G4733 Obstructive sleep apnea (adult) (pediatric): Secondary | ICD-10-CM | POA: Diagnosis not present

## 2023-08-11 DIAGNOSIS — I10 Essential (primary) hypertension: Secondary | ICD-10-CM | POA: Diagnosis not present

## 2023-08-13 ENCOUNTER — Encounter: Payer: Self-pay | Admitting: Family Medicine

## 2023-08-14 NOTE — Telephone Encounter (Signed)
Handicap Placard Application placed in Dr. Daphine Deutscher office in box to complete.

## 2023-08-25 ENCOUNTER — Encounter: Payer: Self-pay | Admitting: Family Medicine

## 2023-08-25 NOTE — Telephone Encounter (Signed)
I have sent message to patient to let know you are out of the office.

## 2023-08-30 ENCOUNTER — Other Ambulatory Visit: Payer: Self-pay | Admitting: Family Medicine

## 2023-08-30 MED ORDER — TRIAMCINOLONE ACETONIDE 0.1 % EX CREA: 1.0000 | TOPICAL_CREAM | Freq: Two times a day (BID) | CUTANEOUS | 0 refills | Status: DC

## 2023-09-05 NOTE — Telephone Encounter (Signed)
TC

## 2023-09-29 ENCOUNTER — Encounter (HOSPITAL_BASED_OUTPATIENT_CLINIC_OR_DEPARTMENT_OTHER): Payer: Self-pay | Admitting: Cardiology

## 2023-09-29 ENCOUNTER — Ambulatory Visit (HOSPITAL_BASED_OUTPATIENT_CLINIC_OR_DEPARTMENT_OTHER): Payer: BC Managed Care – PPO | Admitting: Cardiology

## 2023-09-29 VITALS — BP 124/74 | HR 68 | Ht 63.0 in | Wt 196.0 lb

## 2023-09-29 DIAGNOSIS — E119 Type 2 diabetes mellitus without complications: Secondary | ICD-10-CM

## 2023-09-29 DIAGNOSIS — R0602 Shortness of breath: Secondary | ICD-10-CM

## 2023-09-29 DIAGNOSIS — E7800 Pure hypercholesterolemia, unspecified: Secondary | ICD-10-CM

## 2023-09-29 DIAGNOSIS — Z7189 Other specified counseling: Secondary | ICD-10-CM

## 2023-09-29 DIAGNOSIS — I10 Essential (primary) hypertension: Secondary | ICD-10-CM

## 2023-09-29 DIAGNOSIS — I2510 Atherosclerotic heart disease of native coronary artery without angina pectoris: Secondary | ICD-10-CM

## 2023-09-29 NOTE — Progress Notes (Signed)
Cardiology Office Note:  .   Date:  09/29/2023  ID:  Rachel Rivas, DOB [birth year], MRN [id number] PCP: Excell Seltzer, MD  [facility] Providers Cardiologist:  Jodelle Red, MD {  History of Present Illness: .   Rachel Rivas is a 62 y.o. female with a hx of nonobstructive CAD based on calcium score, hypertension, rheumatoid arthritis, type II diabetes, hyperlipidemia, uterine cancer who is seen for follow up today. I initially met her 11/05/2019 as a new consult at the request of Bedsole, Amy E, MD for the evaluation and management of shortness of breath.   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 Did six rounds of adjuvant chemo (carboplatin and taxol), has been exhausted since then. Has been short of breath since Covid infection. CT chest 2021 with coronary calcium, no score available.  Today: Shortness of breath unchanged over the last several years, since she had Covid. Doesn't limit her activity, but notices more when she is being active.  Blood pressure was 104/60 at her PCP visit 07/25/23, and her hydrochlorothiazide was stopped at that time. Has not noticed any very low/high numbers since then.  Has been mounjaro with her diabetes, tolerating well.  ROS: Denies chest pain, shortness of breath at rest or with normal exertion. No PND, orthopnea, LE edema or unexpected weight gain. No syncope or palpitations. ROS otherwise negative except as noted.   Studies Reviewed: Marland Kitchen    EKG:  EKG Interpretation Date/Time:  Friday September 29 2023 15:28:40 EST Ventricular Rate:  68 PR Interval:  172 QRS Duration:  88 QT Interval:  400 QTC Calculation: 425 R Axis:   19  Text Interpretation: Normal sinus rhythm Cannot rule out Anterior infarct , age undetermined Confirmed by Jodelle Red [phone]) on 09/29/2023 3:37:23 PM    Physical Exam:   VS:   BP 124/74 (BP Location: Left Arm, Patient Position: Sitting, Cuff Size: Large)   Pulse 68   Ht 5\' 3"  (1.6 m)   Wt 196 lb (88.9 kg)   LMP 11/17/2012   BMI 34.72 kg/m    Wt Readings from Last 3 Encounters:  09/29/23 196 lb (88.9 kg)  07/25/23 196 lb (88.9 kg)  02/03/23 205 lb (93 kg)    GEN: Well nourished, well developed in no acute distress HEENT: Normal, moist mucous membranes NECK: No JVD CARDIAC: regular rhythm, normal S1 and S2, no rubs or gallops. No murmur. VASCULAR: Radial and DP pulses 2+ bilaterally. No carotid bruits RESPIRATORY:  Clear to auscultation without rales, wheezing or rhonchi  ABDOMEN: Soft, non-tender, non-distended MUSCULOSKELETAL:  Ambulates independently SKIN: Warm and dry, no edema NEUROLOGIC:  Alert and oriented x 3. No focal neuro deficits noted. PSYCHIATRIC:  Normal affect    ASSESSMENT AND PLAN: .    Shortness of breath:  -stable.  -did not complete prior echo -she will contact me if symptoms significant worsen   Hypertension: at goal today -continue losartan   Coronary artery calcification, consistent with nonobstructive CAD Hypercholesterolemia Type II diabetes -tolerates rosuvastatin 2-3 times/week, takes CoQ10 for cramps. Tries to take it every day when she can tolerate. -agree with GLP1RA with nonobstructive CAD and diabetes. -last LDL 76  CV risk counseling and prevention -recommend heart healthy/Mediterranean diet, with whole grains, fruits, vegetable, fish, lean meats, nuts, and olive oil. Limit salt. -recommend moderate walking, 3-5 times/week for 30-50 minutes each session. Aim for at least 150 minutes.week. Goal should  be pace of 3 miles/hours, or walking 1.5 miles in 30 minutes -recommend avoidance of tobacco products. Avoid excess alcohol.  Dispo: 1 year   Signed, Jodelle Red, MD   Jodelle Red, MD, PhD, [facility]  [facility]  [place]  [facility] at [facility] at  [facility] [address], [address] [place], [place] [address] [phone]

## 2023-09-29 NOTE — Patient Instructions (Signed)

## 2023-10-24 ENCOUNTER — Telehealth: Payer: Self-pay

## 2023-10-24 NOTE — Telephone Encounter (Signed)
Called and left a message asking her to call the office back. Asking if she can move her lab/MD appt on 12/10 to 12/12.

## 2023-10-26 ENCOUNTER — Telehealth: Payer: Self-pay

## 2023-10-26 NOTE — Telephone Encounter (Signed)
Called pt once more to further discuss need for r/s 12/10 appt, She agreed to r/s to 11/03/23 1100 lans and 1120 MD visit.

## 2023-10-31 ENCOUNTER — Other Ambulatory Visit: Payer: BC Managed Care – PPO

## 2023-10-31 ENCOUNTER — Ambulatory Visit: Payer: BC Managed Care – PPO | Admitting: Hematology and Oncology

## 2023-11-03 ENCOUNTER — Encounter: Payer: Self-pay | Admitting: Hematology and Oncology

## 2023-11-03 ENCOUNTER — Inpatient Hospital Stay: Payer: BC Managed Care – PPO | Attending: Hematology and Oncology

## 2023-11-03 ENCOUNTER — Inpatient Hospital Stay (HOSPITAL_BASED_OUTPATIENT_CLINIC_OR_DEPARTMENT_OTHER): Payer: BC Managed Care – PPO | Admitting: Hematology and Oncology

## 2023-11-03 VITALS — BP 143/70 | HR 63 | Temp 97.4°F | Resp 18 | Ht 63.0 in | Wt 195.6 lb

## 2023-11-03 DIAGNOSIS — Z8542 Personal history of malignant neoplasm of other parts of uterus: Secondary | ICD-10-CM | POA: Insufficient documentation

## 2023-11-03 DIAGNOSIS — C549 Malignant neoplasm of corpus uteri, unspecified: Secondary | ICD-10-CM

## 2023-11-03 DIAGNOSIS — Z9221 Personal history of antineoplastic chemotherapy: Secondary | ICD-10-CM | POA: Diagnosis not present

## 2023-11-03 DIAGNOSIS — D693 Immune thrombocytopenic purpura: Secondary | ICD-10-CM | POA: Insufficient documentation

## 2023-11-03 DIAGNOSIS — D696 Thrombocytopenia, unspecified: Secondary | ICD-10-CM | POA: Diagnosis not present

## 2023-11-03 DIAGNOSIS — Z90710 Acquired absence of both cervix and uterus: Secondary | ICD-10-CM | POA: Insufficient documentation

## 2023-11-03 LAB — CBC WITH DIFFERENTIAL/PLATELET
Abs Immature Granulocytes: 0.02 10*3/uL (ref 0.00–0.07)
Basophils Absolute: 0 10*3/uL (ref 0.0–0.1)
Basophils Relative: 0 %
Eosinophils Absolute: 0.1 10*3/uL (ref 0.0–0.5)
Eosinophils Relative: 2 %
HCT: 38.7 % (ref 36.0–46.0)
Hemoglobin: 12.6 g/dL (ref 12.0–15.0)
Immature Granulocytes: 0 %
Lymphocytes Relative: 44 %
Lymphs Abs: 2.5 10*3/uL (ref 0.7–4.0)
MCH: 27.5 pg (ref 26.0–34.0)
MCHC: 32.6 g/dL (ref 30.0–36.0)
MCV: 84.5 fL (ref 80.0–100.0)
Monocytes Absolute: 0.6 10*3/uL (ref 0.1–1.0)
Monocytes Relative: 10 %
Neutro Abs: 2.5 10*3/uL (ref 1.7–7.7)
Neutrophils Relative %: 44 %
Platelets: 50 10*3/uL — ABNORMAL LOW (ref 150–400)
RBC: 4.58 MIL/uL (ref 3.87–5.11)
RDW: 14.8 % (ref 11.5–15.5)
WBC: 5.7 10*3/uL (ref 4.0–10.5)
nRBC: 0 % (ref 0.0–0.2)

## 2023-11-03 NOTE — Assessment & Plan Note (Signed)
Her last CT imaging showed no evidence of disease Examination today is benign She is overdue for GYN exam I will reach out to the clinic for appointment

## 2023-11-03 NOTE — Progress Notes (Signed)
[facility] OFFICE PROGRESS NOTE  Patient Care Team: Excell Seltzer, MD as PCP - General (Family Medicine) Jodelle Red, MD as PCP - Cardiology (Cardiology)  ASSESSMENT & PLAN:  Adenosarcoma of body of uterus [facility]) Her last CT imaging showed no evidence of disease Examination today is benign She is overdue for GYN exam I will reach out to the clinic for appointment  Thrombocytopenia [facility]) She has chronic ITP that does not require treatment However, her platelet count is trending low Although she is not symptomatic, I suspect she might need treatment soon We discussed risk, benefits, side effects of rituximab versus Promacta We discussed the role of CT imaging and bone marrow biopsy before we proceed She would like to think about it She will call me next week for final decision  No orders of the defined types were placed in this encounter.   All questions were answered. The patient knows to call the clinic with any problems, questions or concerns. The total time spent in the appointment was 30 minutes encounter with patients including review of chart and various tests results, discussions about plan of care and coordination of care plan   Artis Delay, MD 11/03/2023 12:26 PM  INTERVAL HISTORY: Please see below for problem oriented charting. she returns for surveillance follow-up for history of uterine cancer She also history of ITP She is doing well She had no signs or symptoms of abdominal bloating, pain or changes in bowel habits No recent bleeding  REVIEW OF SYSTEMS:   Constitutional: Denies fevers, chills or abnormal weight loss Eyes: Denies blurriness of vision Ears, nose, mouth, throat, and face: Denies mucositis or sore throat Respiratory: Denies cough, dyspnea or wheezes Cardiovascular: Denies palpitation, chest discomfort or lower extremity swelling Gastrointestinal:  Denies nausea, heartburn or change in bowel habits Skin: Denies abnormal  skin rashes Lymphatics: Denies new lymphadenopathy or easy bruising Neurological:Denies numbness, tingling or new weaknesses Behavioral/Psych: Mood is stable, no new changes  All other systems were reviewed with the patient and are negative.  I have reviewed the past medical history, past surgical history, social history and family history with the patient and they are unchanged from previous note.  ALLERGIES:  is allergic to latex, sulfa antibiotics, and elemental sulfur.  MEDICATIONS:  Current Outpatient Medications  Medication Sig Dispense Refill   albuterol (VENTOLIN HFA) 108 (90 Base) MCG/ACT inhaler Inhale 2 puffs into the lungs every 6 (six) hours as needed for wheezing or shortness of breath. 8 g 2   Coenzyme Q10 (COQ10 PO) Take 2 each by mouth daily.     cyclobenzaprine (FLEXERIL) 10 MG tablet Take 1 tablet (10 mg total) by mouth at bedtime as needed for muscle spasms. 15 tablet 0   diclofenac Sodium (VOLTAREN) 1 % GEL Apply topically.     famotidine (PEPCID) 20 MG tablet TAKE 1 TABLET BY MOUTH TWICE DAILY FOR HEART 180 tablet 3   losartan (COZAAR) 50 MG tablet Take 1 tablet (50 mg total) by mouth daily. 90 tablet 3   Multiple Vitamin (MULTIVITAMIN ADULT PO) Take by mouth.     rosuvastatin (CRESTOR) 5 MG tablet Take 1 tablet (5 mg total) by mouth daily. Please keep your upcoming appointment for refills. 90 tablet 0   tirzepatide (MOUNJARO) 7.5 MG/0.5ML Pen Inject 7.5 mg into the skin once a week. 6 mL 3   triamcinolone cream (KENALOG) 0.1 % Apply 1 Application topically 2 (two) times daily. 30 g 0   VITAMIN D PO Take by  mouth.     No current facility-administered medications for this visit.    SUMMARY OF ONCOLOGIC HISTORY: Oncology History Overview Note  MSI stable   Adenosarcoma of body of uterus (HCC)  03/03/2020 Initial Diagnosis   She developed PMB in early April after going through menopause at approximately age 8. She saw her PCP on 4/13 for bleeding and an ultrasound  was obtained showing a thickened endometrial lining and 3-4cm mass within the endocervical canal. She was referred to [facility], started on Megace and ultimately underwent a TRH/BSO on 5/18 for abnormal uterine bleeding with resulting anemia, large prolapsing fibroid.    03/06/2020 Imaging   US pelvis Markedly thickened heterogeneous, and hypervascular endometrial complex up to 38 mm thick highly concerning for endometrial neoplasm.   Additional more focal 3.0 x 2.8 x 5.3 cm diameter mass within endocervical canal, may represent extension of above endometrial pathology versus separate endocervical tumor.   Tissue diagnosis recommended.   Nonvisualization of ovaries.   04/07/2020 Pathology Results   A. UTERUS, CERVIX AND BILATERAL FALLOPIAN TUBES AND OVARIES, HYSTERECTOMY AND BILATERAL SALPINGO-OOPHORECTOMY: - Mullerian adenosarcoma of endometrium. - Tumor limited to the uterus. - Tumor invades to less than half of the myometrium. - No involvement of adnexa. - Margins of resection are not involved. - See oncology table and comment. ONCOLOGY TABLE: UTERUS, SARCOMA: Procedure: Total hysterectomy and bilateral salpingo-oophorectomy Specimen Integrity: The uterus, cervix and bilateral fallopian tubes and ovaries were received intact. The nodule clinically identified as vaginally prolapsed uterine fibroid was received separate and disrupted. Tumor Size: Greatest dimension: 7.5 cm Histologic Type: Mullerian adenosarcoma with overgrowth of the epithelial and stromal components Histologic Grade: The epithelial component has proliferated to form endometrioid adenocarcinoma which ranges from low grade to high grade. The stromal component focally shows sarcomatous overgrowth. Myometrial Invasion: Present Depth of invasion: 4 mm Myometrial thickness: 30 mm Other Tissue/Organ Involvement: Not identified Margins: Uninvolved by sarcoma Lymphovascular Invasion: Not identified Regional Lymph Nodes: No  lymph nodes submitted or found Pathologic Stage Classification (pTNM, AJCC 8th Edition): pT1b, pNX Additional Pathologic Findings: Adenomyosis. Leiomyomata.   04/07/2020 Surgery   PRE-OPERATIVE DIAGNOSIS:  Large prolapsed fibroid vaginally, menometrorrhagia with anemia   POST-OPERATIVE DIAGNOSIS:  Large prolapsed fibroid vaginally, menometrorrhagia with anemia.  Omental adhesions with anterior abdominal wall.   PROCEDURE:  Procedure(s): XI ROBOTIC TOTAL LAPAROSCOPIC HYSTERECTOMY, BILATERAL SALPINGO OOPHORECTOMY/LYSIS OF ADHESIONS/VAGINAL MYOMECTOMY    FINDINGS: Large necrotic vaginally prolapsed uterine myoma.  Uterus with small subserosal/intramural myoma.  Bilateral tubes post tubal ligation.  Bilateral normal ovaries.  Omental adhesions with anterior abdominal wall.   05/01/2020 Imaging   Ct scan of chest, abdomen and pelvis 1. Multiple ill-defined low density liver masses, suspicious for metastases. These could be further characterized with pre and postcontrast magnetic resonance imaging of the liver. 2. Moderate diffuse vaginal wall thickening, possibly representing edema associated with the patient's recent surgery. 3. Small hiatal hernia. 4. Minimal coronary artery atheromatous calcifications. 5. Cholelithiasis. 6. Tiny rounded area of low density in the posterior spleen, too small to characterize.   05/11/2020 Cancer Staging   Staging form: Corpus Uteri - Adenosarcoma, AJCC 8th Edition - Pathologic: Stage IB (pT1b, pN0, cM0) - Signed by Artis Delay, MD on 05/15/2020   05/14/2020 Imaging   MRI liver 1. The 2 dominant right hepatic lobe lesions have MR imaging features most consistent with benign cavernous hemangioma. The remaining tiny liver lesions are too small to characterize. Statistically, while these are likely benign, follow-up MRI in 3 months  recommended to ensure stability. 2. No other findings to suggest metastatic disease.   05/22/2020 Procedure   Placement of a  subcutaneous port device. Catheter tip at the superior cavoatrial junction   05/29/2020 - 09/11/2020 Chemotherapy   The patient had carboplatin and taxol for chemotherapy treatment.     07/22/2020 Imaging   1. Status post hysterectomy and bilateral oophorectomy. Irregular soft tissue density in the region of the vaginal cuff is relatively similar to on the prior exam. Although this could represent developing scar or chronic hematoma, residual disease cannot be excluded. This could either be re-evaluated at follow-up or if a more aggressive approach is desired, characterized with PET. 2. No other evidence of metastatic disease in the abdomen or pelvis. 3. Hepatic hemangiomas, as before. Smaller liver lesions are unchanged, favoring a benign etiology. 4. Cholelithiasis. 5. Tiny hiatal hernia. 6. Aortic Atherosclerosis (ICD10-I70.0).   10/12/2020 Imaging   1. Stable appearance of the vaginal cuff status post hysterectomy. Soft tissue fullness appears unchanged, likely postsurgical in etiology. Correlate with physical examination and tumor markers. 2. No evidence of metastatic disease. 3. Stable hepatic hemangiomas. 4. Cholelithiasis without evidence of cholecystitis or biliary dilatation. 5. Aortic Atherosclerosis (ICD10-I70.0).   04/12/2021 Imaging   1. Status post hysterectomy. Unchanged appearance of somewhat nodular soft tissue at the left and right aspects of the vaginal cuff, again favored to represent postoperative change without specific findings or evidence of progression to suggest local malignant recurrence. 2. No noncontrast evidence of metastatic disease in the abdomen or pelvis. 3. Cholelithiasis.   Aortic Atherosclerosis (ICD10-I70.0).   04/30/2021 Procedure   Removal of implanted Port-A-Cath utilizing sharp and blunt dissection. The procedure was uncomplicated.   10/31/2022 Imaging   1. Stable CT of the abdomen and pelvis. No specific findings identified to suggest residual or  recurrent tumor or metastatic disease. 2. Stable liver hemangiomas. 3. Gallstone.     PHYSICAL EXAMINATION: ECOG PERFORMANCE STATUS: 0 - Asymptomatic  Vitals:   11/03/23 1135  BP: (!) 143/70  Pulse: 63  Resp: 18  Temp: (!) 97.4 F (36.3 C)  SpO2: 99%   Filed Weights   11/03/23 1135  Weight: 195 lb 9.6 oz (88.7 kg)    GENERAL:alert, no distress and comfortable SKIN: skin color, texture, turgor are normal, no rashes or significant lesions EYES: normal, Conjunctiva are pink and non-injected, sclera clear OROPHARYNX:no exudate, no erythema and lips, buccal mucosa, and tongue normal  NECK: supple, thyroid normal size, non-tender, without nodularity LYMPH:  no palpable lymphadenopathy in the cervical, axillary or inguinal LUNGS: clear to auscultation and percussion with normal breathing effort HEART: regular rate & rhythm and no murmurs and no lower extremity edema ABDOMEN:abdomen soft, non-tender and normal bowel sounds Musculoskeletal:no cyanosis of digits and no clubbing  NEURO: alert & oriented x 3 with fluent speech, no focal motor/sensory deficits  LABORATORY DATA:  I have reviewed the data as listed    Component Value Date/Time   NA 142 07/18/2023 0803   NA 142 01/28/2021 0922   NA 142 04/07/2017 0810   K 3.7 07/18/2023 0803   K 3.8 04/07/2017 0810   CL 103 07/18/2023 0803   CO2 31 07/18/2023 0803   CO2 28 04/07/2017 0810   GLUCOSE 91 07/18/2023 0803   GLUCOSE 100 04/07/2017 0810   BUN 17 07/18/2023 0803   BUN 17 01/28/2021 0922   BUN 16.8 04/07/2017 0810   CREATININE 0.87 07/18/2023 0803   CREATININE 0.78 04/12/2021 1301   CREATININE 0.9 04/07/2017  0810   CALCIUM 9.7 07/18/2023 0803   CALCIUM 9.9 04/07/2017 0810   PROT 7.5 07/18/2023 0803   PROT 7.6 01/28/2021 0922   PROT 8.0 04/07/2017 0810   ALBUMIN 4.2 07/18/2023 0803   ALBUMIN 4.8 01/28/2021 0922   ALBUMIN 4.4 04/07/2017 0810   AST 17 07/18/2023 0803   AST 15 04/12/2021 1301   AST 18 04/07/2017  0810   ALT 17 07/18/2023 0803   ALT 16 04/12/2021 1301   ALT 23 04/07/2017 0810   ALKPHOS 82 07/18/2023 0803   ALKPHOS 79 04/07/2017 0810   BILITOT 0.5 07/18/2023 0803   BILITOT 0.5 04/12/2021 1301   BILITOT 0.70 04/07/2017 0810   GFRNONAA >60 10/20/2022 0755   GFRNONAA >60 04/12/2021 1301   GFRAA >60 08/21/2020 0805    No results found for: "SPEP", "UPEP"  Lab Results  Component Value Date   WBC 5.7 11/03/2023   NEUTROABS 2.5 11/03/2023   HGB 12.6 11/03/2023   HCT 38.7 11/03/2023   MCV 84.5 11/03/2023   PLT 50 (L) 11/03/2023      Chemistry      Component Value Date/Time   NA 142 07/18/2023 0803   NA 142 01/28/2021 0922   NA 142 04/07/2017 0810   K 3.7 07/18/2023 0803   K 3.8 04/07/2017 0810   CL 103 07/18/2023 0803   CO2 31 07/18/2023 0803   CO2 28 04/07/2017 0810   BUN 17 07/18/2023 0803   BUN 17 01/28/2021 0922   BUN 16.8 04/07/2017 0810   CREATININE 0.87 07/18/2023 0803   CREATININE 0.78 04/12/2021 1301   CREATININE 0.9 04/07/2017 0810      Component Value Date/Time   CALCIUM 9.7 07/18/2023 0803   CALCIUM 9.9 04/07/2017 0810   ALKPHOS 82 07/18/2023 0803   ALKPHOS 79 04/07/2017 0810   AST 17 07/18/2023 0803   AST 15 04/12/2021 1301   AST 18 04/07/2017 0810   ALT 17 07/18/2023 0803   ALT 16 04/12/2021 1301   ALT 23 04/07/2017 0810   BILITOT 0.5 07/18/2023 0803   BILITOT 0.5 04/12/2021 1301   BILITOT 0.70 04/07/2017 0810

## 2023-11-03 NOTE — Assessment & Plan Note (Signed)
She has chronic ITP that does not require treatment However, her platelet count is trending low Although she is not symptomatic, I suspect she might need treatment soon We discussed risk, benefits, side effects of rituximab versus Promacta We discussed the role of CT imaging and bone marrow biopsy before we proceed She would like to think about it She will call me next week for final decision

## 2023-11-10 ENCOUNTER — Telehealth: Payer: Self-pay

## 2023-11-10 NOTE — Telephone Encounter (Signed)
Called her back and given below message. She verbalized understanding. Scheduled appt on 1/10 per her request. She is aware of appts.

## 2023-11-10 NOTE — Telephone Encounter (Signed)
Called and left a message asking her to call the office back to schedule appt.

## 2023-11-10 NOTE — Telephone Encounter (Signed)
I do not have time to explain all this over the phone I have more patients after clinic to see at [facility] I recommend we just schedule labs and 20 mins appt next month to discuss

## 2023-11-10 NOTE — Telephone Encounter (Signed)
-----   Message from Artis Delay sent at 11/10/2023  8:14 AM EST ----- She was supposed to call me back on her decision whether she wants treatment for ITP 1) Option 1: further imaging and treatment for ITP 2) Option 2: repeat labs in a month to make sure platelets does not continue to drop

## 2023-11-10 NOTE — Telephone Encounter (Signed)
Called and left a message asking her to call the office back with her decision.

## 2023-11-10 NOTE — Telephone Encounter (Signed)
Called her back and gave her below message.  She is not sure what she wants to do and still has a lot of questions. When given the options, she wants treatment but has a lot of questions. What images would be ordered and what treatment would be given? Then she continued with that she has other questions.  She is asking if you could call her?

## 2023-12-01 ENCOUNTER — Inpatient Hospital Stay: Payer: BC Managed Care – PPO | Admitting: Hematology and Oncology

## 2023-12-01 ENCOUNTER — Inpatient Hospital Stay: Payer: BC Managed Care – PPO

## 2023-12-01 ENCOUNTER — Inpatient Hospital Stay: Payer: BC Managed Care – PPO | Attending: Hematology and Oncology

## 2023-12-01 VITALS — BP 131/71 | HR 64 | Temp 97.5°F | Resp 18 | Ht 63.0 in | Wt 195.4 lb

## 2023-12-01 DIAGNOSIS — D696 Thrombocytopenia, unspecified: Secondary | ICD-10-CM | POA: Insufficient documentation

## 2023-12-01 DIAGNOSIS — Z9221 Personal history of antineoplastic chemotherapy: Secondary | ICD-10-CM | POA: Insufficient documentation

## 2023-12-01 DIAGNOSIS — Z90710 Acquired absence of both cervix and uterus: Secondary | ICD-10-CM | POA: Diagnosis not present

## 2023-12-01 DIAGNOSIS — M069 Rheumatoid arthritis, unspecified: Secondary | ICD-10-CM | POA: Diagnosis not present

## 2023-12-01 DIAGNOSIS — C549 Malignant neoplasm of corpus uteri, unspecified: Secondary | ICD-10-CM | POA: Diagnosis not present

## 2023-12-01 DIAGNOSIS — Z8542 Personal history of malignant neoplasm of other parts of uterus: Secondary | ICD-10-CM | POA: Insufficient documentation

## 2023-12-01 DIAGNOSIS — E559 Vitamin D deficiency, unspecified: Secondary | ICD-10-CM | POA: Insufficient documentation

## 2023-12-01 LAB — CBC WITH DIFFERENTIAL/PLATELET
Abs Immature Granulocytes: 0.01 10*3/uL (ref 0.00–0.07)
Basophils Absolute: 0 10*3/uL (ref 0.0–0.1)
Basophils Relative: 0 %
Eosinophils Absolute: 0.1 10*3/uL (ref 0.0–0.5)
Eosinophils Relative: 2 %
HCT: 38.2 % (ref 36.0–46.0)
Hemoglobin: 12.3 g/dL (ref 12.0–15.0)
Immature Granulocytes: 0 %
Lymphocytes Relative: 44 %
Lymphs Abs: 2.5 10*3/uL (ref 0.7–4.0)
MCH: 27.2 pg (ref 26.0–34.0)
MCHC: 32.2 g/dL (ref 30.0–36.0)
MCV: 84.5 fL (ref 80.0–100.0)
Monocytes Absolute: 0.6 10*3/uL (ref 0.1–1.0)
Monocytes Relative: 10 %
Neutro Abs: 2.5 10*3/uL (ref 1.7–7.7)
Neutrophils Relative %: 44 %
Platelets: 47 10*3/uL — ABNORMAL LOW (ref 150–400)
RBC: 4.52 MIL/uL (ref 3.87–5.11)
RDW: 14.6 % (ref 11.5–15.5)
WBC: 5.7 10*3/uL (ref 4.0–10.5)
nRBC: 0 % (ref 0.0–0.2)

## 2023-12-01 LAB — CMP (CANCER CENTER ONLY)
ALT: 14 U/L (ref 0–44)
AST: 14 U/L — ABNORMAL LOW (ref 15–41)
Albumin: 4.8 g/dL (ref 3.5–5.0)
Alkaline Phosphatase: 86 U/L (ref 38–126)
Anion gap: 6 (ref 5–15)
BUN: 17 mg/dL (ref 8–23)
CO2: 29 mmol/L (ref 22–32)
Calcium: 9.8 mg/dL (ref 8.9–10.3)
Chloride: 104 mmol/L (ref 98–111)
Creatinine: 0.77 mg/dL (ref 0.44–1.00)
GFR, Estimated: >60 mL/min (ref >60)
Glucose, Bld: 91 mg/dL (ref 70–99)
Potassium: 4 mmol/L (ref 3.5–5.1)
Sodium: 139 mmol/L (ref 135–145)
Total Bilirubin: 1.2 mg/dL (ref 0.0–1.2)
Total Protein: 8.2 g/dL — ABNORMAL HIGH (ref 6.5–8.1)

## 2023-12-01 LAB — HEPATITIS B SURFACE ANTIBODY,QUALITATIVE: Hep B S Ab: NONREACTIVE

## 2023-12-01 LAB — HEPATITIS B SURFACE ANTIGEN: Hepatitis B Surface Ag: NONREACTIVE

## 2023-12-01 LAB — HEPATITIS B CORE ANTIBODY, IGM: Hep B C IgM: NONREACTIVE

## 2023-12-01 LAB — SEDIMENTATION RATE: Sed Rate: 16 mm/h (ref 0–22)

## 2023-12-01 LAB — VITAMIN B12: Vitamin B-12: 283 pg/mL (ref 180–914)

## 2023-12-01 LAB — VITAMIN D 25 HYDROXY (VIT D DEFICIENCY, FRACTURES): Vit D, 25-Hydroxy: 24.22 ng/mL — ABNORMAL LOW (ref 30–100)

## 2023-12-01 LAB — URIC ACID: Uric Acid, Serum: 3.4 mg/dL (ref 2.5–7.1)

## 2023-12-03 ENCOUNTER — Encounter: Payer: Self-pay | Admitting: Hematology and Oncology

## 2023-12-03 LAB — RHEUMATOID FACTOR: Rheumatoid fact SerPl-aCnc: 56.5 [IU]/mL — ABNORMAL HIGH (ref <14.0)

## 2023-12-03 NOTE — Assessment & Plan Note (Signed)
 Her last CT imaging showed no evidence of disease Her recent exam is benign Observe closely

## 2023-12-03 NOTE — Assessment & Plan Note (Signed)
 She has worsening drop in platelet count I suspect there is a component of active autoimmune disease She had diagnosis of rheumatoid arthritis but her treatment was discontinued when she received chemotherapy in the past and never restarted Clinically, she has no signs or symptoms of active rheumatoid arthritis I plan to repeat labs along with other blood work We have extensive discussions about various options for ITP We discussed the role of CT imaging as well as additional blood work The patient is interested to consider treatment with prednisone  My major concern of putting her back on prednisone  would be excessive weight gain The patient appears motivated to start lifestyle changes and I gave her some patient information regarding enrollment at [facility]

## 2023-12-03 NOTE — Progress Notes (Signed)
 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[facility]) Her last CT imaging showed no evidence of disease Her recent exam is benign Observe closely  Thrombocytopenia (HCC) She has worsening drop in platelet count I suspect there is a component of active autoimmune disease She had diagnosis of rheumatoid arthritis but her treatment was discontinued when she received chemotherapy in the past and never restarted Clinically, she has no signs or symptoms of active rheumatoid arthritis I plan to repeat labs along with other blood work We have extensive discussions about various options for ITP We discussed the role of CT imaging as well as additional blood work The patient is interested to consider treatment with prednisone  My major concern of putting her back on prednisone  would be excessive weight gain The patient appears motivated to start lifestyle changes and I gave her some patient information regarding enrollment at [facility]  Orders Placed This Encounter  Procedures   CT CHEST ABDOMEN PELVIS W CONTRAST    Standing Status:   Future    Expected Date:   12/08/2023    Expiration Date:   11/30/2024    If indicated for the ordered procedure, I authorize the administration of contrast media per Radiology protocol:   Yes    Does the patient have a contrast media/X-ray dye allergy?:   No    Preferred imaging location?:   [facility]    If indicated for the ordered procedure, I authorize the administration of oral contrast media per Radiology protocol:   Yes   CMP (Cancer Center only)    Standing Status:   Future    Number of Occurrences:   1    Expiration Date:   11/30/2024   Vitamin B12    Standing Status:   Future    Number of Occurrences:   1    Expiration Date:   11/30/2024   Hepatitis B  surface antibody,qualitative    Standing Status:   Future    Number of Occurrences:   1    Expiration Date:   11/30/2024   Hepatitis B surface antigen    Standing Status:   Future    Number of Occurrences:   1    Expiration Date:   11/30/2024   Hepatitis B core antibody, IgM    Standing Status:   Future    Number of Occurrences:   1    Expiration Date:   11/30/2024   Uric acid    Standing Status:   Future    Number of Occurrences:   1    Expiration Date:   11/30/2024   ANA, IFA (with reflex)    Standing Status:   Future    Number of Occurrences:   1    Expiration Date:   11/30/2024   Rheumatoid factor    Standing Status:   Future    Number of Occurrences:   1    Expiration Date:   11/30/2024   Sedimentation rate    Standing Status:   Future    Number of Occurrences:   1    Expiration Date:   11/30/2024   VITAMIN D  25 Hydroxy (Vit-D Deficiency, Fractures)    Standing Status:   Future    Number of Occurrences:   1    Expiration Date:   11/30/2024  All questions were answered. The patient knows to call the clinic with any problems, questions or concerns. The total time spent in the appointment was 40 minutes encounter with patients including review of chart and various tests results, discussions about plan of care and coordination of care plan   Rachel Bedford, MD 12/03/2023 1:19 PM  INTERVAL HISTORY: Please see below for problem oriented charting. she returns for further discussion for ITP She is here accompanied by her daughter She had recent rectal bleeding but she thought is related to straining due to constipation We have extensive discussions about the role of different treatment for ITP and tests that we need to order prior to treatment  REVIEW OF SYSTEMS:   Constitutional: Denies fevers, chills or abnormal weight loss Eyes: Denies blurriness of vision Ears, nose, mouth, throat, and face: Denies mucositis or sore throat Respiratory: Denies cough, dyspnea or  wheezes Cardiovascular: Denies palpitation, chest discomfort or lower extremity swelling Gastrointestinal:  Denies nausea, heartburn or change in bowel habits Skin: Denies abnormal skin rashes Lymphatics: Denies new lymphadenopathy or easy bruising Neurological:Denies numbness, tingling or new weaknesses Behavioral/Psych: Mood is stable, no new changes  All other systems were reviewed with the patient and are negative.  I have reviewed the past medical history, past surgical history, social history and family history with the patient and they are unchanged from previous note.  ALLERGIES:  is allergic to latex, sulfa antibiotics, and elemental sulfur.  MEDICATIONS:  Current Outpatient Medications  Medication Sig Dispense Refill   albuterol  (VENTOLIN  HFA) 108 (90 Base) MCG/ACT inhaler Inhale 2 puffs into the lungs every 6 (six) hours as needed for wheezing or shortness of breath. 8 g 2   Coenzyme Q10 (COQ10 PO) Take 2 each by mouth daily.     cyclobenzaprine  (FLEXERIL ) 10 MG tablet Take 1 tablet (10 mg total) by mouth at bedtime as needed for muscle spasms. 15 tablet 0   diclofenac  Sodium (VOLTAREN ) 1 % GEL Apply topically.     famotidine  (PEPCID ) 20 MG tablet TAKE 1 TABLET BY MOUTH TWICE DAILY FOR HEART 180 tablet 3   losartan  (COZAAR ) 50 MG tablet Take 1 tablet (50 mg total) by mouth daily. 90 tablet 3   Multiple Vitamin (MULTIVITAMIN ADULT PO) Take by mouth.     rosuvastatin  (CRESTOR ) 5 MG tablet Take 1 tablet (5 mg total) by mouth daily. Please keep your upcoming appointment for refills. 90 tablet 0   tirzepatide  (MOUNJARO ) 7.5 MG/0.5ML Pen Inject 7.5 mg into the skin once a week. 6 mL 3   triamcinolone  cream (KENALOG ) 0.1 % Apply 1 Application topically 2 (two) times daily. 30 g 0   VITAMIN D  PO Take by mouth.     No current facility-administered medications for this visit.    SUMMARY OF ONCOLOGIC HISTORY: Oncology History Overview Note  MSI stable   Adenosarcoma of body of  uterus (HCC)  03/03/2020 Initial Diagnosis   She developed PMB in early April after going through menopause at approximately age 63. She saw her PCP on 4/13 for bleeding and an ultrasound was obtained showing a thickened endometrial lining and 3-4cm mass within the endocervical canal. She was referred to Gyn, started on Megace  and ultimately underwent a TRH/BSO on 5/18 for abnormal uterine bleeding with resulting anemia, large prolapsing fibroid.    03/06/2020 Imaging   US  pelvis Markedly thickened heterogeneous, and hypervascular endometrial complex up to 38 mm thick highly concerning for endometrial neoplasm.   Additional more focal 3.0 x 2.8  x 5.3 cm diameter mass within endocervical canal, may represent extension of above endometrial pathology versus separate endocervical tumor.   Tissue diagnosis recommended.   Nonvisualization of ovaries.   04/07/2020 Pathology Results   A. UTERUS, CERVIX AND BILATERAL FALLOPIAN TUBES AND OVARIES, HYSTERECTOMY AND BILATERAL SALPINGO-OOPHORECTOMY: - Mullerian adenosarcoma of endometrium. - Tumor limited to the uterus. - Tumor invades to less than half of the myometrium. - No involvement of adnexa. - Margins of resection are not involved. - See oncology table and comment. ONCOLOGY TABLE: UTERUS, SARCOMA: Procedure: Total hysterectomy and bilateral salpingo-oophorectomy Specimen Integrity: The uterus, cervix and bilateral fallopian tubes and ovaries were received intact. The nodule clinically identified as vaginally prolapsed uterine fibroid was received separate and disrupted. Tumor Size: Greatest dimension: 7.5 cm Histologic Type: Mullerian adenosarcoma with overgrowth of the epithelial and stromal components Histologic Grade: The epithelial component has proliferated to form endometrioid adenocarcinoma which ranges from low grade to high grade. The stromal component focally shows sarcomatous overgrowth. Myometrial Invasion: Present Depth of  invasion: 4 mm Myometrial thickness: 30 mm Other Tissue/Organ Involvement: Not identified Margins: Uninvolved by sarcoma Lymphovascular Invasion: Not identified Regional Lymph Nodes: No lymph nodes submitted or found Pathologic Stage Classification (pTNM, AJCC 8th Edition): pT1b, pNX Additional Pathologic Findings: Adenomyosis. Leiomyomata.   04/07/2020 Surgery   PRE-OPERATIVE DIAGNOSIS:  Large prolapsed fibroid vaginally, menometrorrhagia with anemia   POST-OPERATIVE DIAGNOSIS:  Large prolapsed fibroid vaginally, menometrorrhagia with anemia.  Omental adhesions with anterior abdominal wall.   PROCEDURE:  Procedure(s): XI ROBOTIC TOTAL LAPAROSCOPIC HYSTERECTOMY, BILATERAL SALPINGO OOPHORECTOMY/LYSIS OF ADHESIONS/VAGINAL MYOMECTOMY    FINDINGS: Large necrotic vaginally prolapsed uterine myoma.  Uterus with small subserosal/intramural myoma.  Bilateral tubes post tubal ligation.  Bilateral normal ovaries.  Omental adhesions with anterior abdominal wall.   05/01/2020 Imaging   Ct scan of chest, abdomen and pelvis 1. Multiple ill-defined low density liver masses, suspicious for metastases. These could be further characterized with pre and postcontrast magnetic resonance imaging of the liver. 2. Moderate diffuse vaginal wall thickening, possibly representing edema associated with the patient's recent surgery. 3. Small hiatal hernia. 4. Minimal coronary artery atheromatous calcifications. 5. Cholelithiasis. 6. Tiny rounded area of low density in the posterior spleen, too small to characterize.   05/11/2020 Cancer Staging   Staging form: Corpus Uteri - Adenosarcoma, AJCC 8th Edition - Pathologic: Stage IB (pT1b, pN0, cM0) - Signed by Lonn Hicks, MD on 05/15/2020   05/14/2020 Imaging   MRI liver 1. The 2 dominant right hepatic lobe lesions have MR imaging features most consistent with benign cavernous hemangioma. The remaining tiny liver lesions are too small to characterize. Statistically,  while these are likely benign, follow-up MRI in 3 months recommended to ensure stability. 2. No other findings to suggest metastatic disease.   05/22/2020 Procedure   Placement of a subcutaneous port device. Catheter tip at the superior cavoatrial junction   05/29/2020 - 09/11/2020 Chemotherapy   The patient had carboplatin  and taxol  for chemotherapy treatment.     07/22/2020 Imaging   1. Status post hysterectomy and bilateral oophorectomy. Irregular soft tissue density in the region of the vaginal cuff is relatively similar to on the prior exam. Although this could represent developing scar or chronic hematoma, residual disease cannot be excluded. This could either be re-evaluated at follow-up or if a more aggressive approach is desired, characterized with PET. 2. No other evidence of metastatic disease in the abdomen or pelvis. 3. Hepatic hemangiomas, as before. Smaller liver lesions are unchanged, favoring a  benign etiology. 4. Cholelithiasis. 5. Tiny hiatal hernia. 6. Aortic Atherosclerosis (ICD10-I70.0).   10/12/2020 Imaging   1. Stable appearance of the vaginal cuff status post hysterectomy. Soft tissue fullness appears unchanged, likely postsurgical in etiology. Correlate with physical examination and tumor markers. 2. No evidence of metastatic disease. 3. Stable hepatic hemangiomas. 4. Cholelithiasis without evidence of cholecystitis or biliary dilatation. 5. Aortic Atherosclerosis (ICD10-I70.0).   04/12/2021 Imaging   1. Status post hysterectomy. Unchanged appearance of somewhat nodular soft tissue at the left and right aspects of the vaginal cuff, again favored to represent postoperative change without specific findings or evidence of progression to suggest local malignant recurrence. 2. No noncontrast evidence of metastatic disease in the abdomen or pelvis. 3. Cholelithiasis.   Aortic Atherosclerosis (ICD10-I70.0).   04/30/2021 Procedure   Removal of implanted Port-A-Cath  utilizing sharp and blunt dissection. The procedure was uncomplicated.   10/31/2022 Imaging   1. Stable CT of the abdomen and pelvis. No specific findings identified to suggest residual or recurrent tumor or metastatic disease. 2. Stable liver hemangiomas. 3. Gallstone.     PHYSICAL EXAMINATION: ECOG PERFORMANCE STATUS: 0 - Asymptomatic  Vitals:   12/01/23 0842  BP: 131/71  Pulse: 64  Resp: 18  Temp: (!) 97.5 F (36.4 C)  SpO2: 98%   Filed Weights   12/01/23 0842  Weight: 195 lb 6.4 oz (88.6 kg)    GENERAL:alert, no distress and comfortable   LABORATORY DATA:  I have reviewed the data as listed    Component Value Date/Time   NA 139 12/01/2023 0921   NA 142 01/28/2021 0922   NA 142 04/07/2017 0810   K 4.0 12/01/2023 0921   K 3.8 04/07/2017 0810   CL 104 12/01/2023 0921   CO2 29 12/01/2023 0921   CO2 28 04/07/2017 0810   GLUCOSE 91 12/01/2023 0921   GLUCOSE 100 04/07/2017 0810   BUN 17 12/01/2023 0921   BUN 17 01/28/2021 0922   BUN 16.8 04/07/2017 0810   CREATININE 0.77 12/01/2023 0921   CREATININE 0.9 04/07/2017 0810   CALCIUM  9.8 12/01/2023 0921   CALCIUM  9.9 04/07/2017 0810   PROT 8.2 (H) 12/01/2023 0921   PROT 7.6 01/28/2021 0922   PROT 8.0 04/07/2017 0810   ALBUMIN 4.8 12/01/2023 0921   ALBUMIN 4.8 01/28/2021 0922   ALBUMIN 4.4 04/07/2017 0810   AST 14 (L) 12/01/2023 0921   AST 18 04/07/2017 0810   ALT 14 12/01/2023 0921   ALT 23 04/07/2017 0810   ALKPHOS 86 12/01/2023 0921   ALKPHOS 79 04/07/2017 0810   BILITOT 1.2 12/01/2023 0921   BILITOT 0.70 04/07/2017 0810   GFRNONAA >60 12/01/2023 0921   GFRAA >60 08/21/2020 0805    No results found for: SPEP, UPEP  Lab Results  Component Value Date   WBC 5.7 12/01/2023   NEUTROABS 2.5 12/01/2023   HGB 12.3 12/01/2023   HCT 38.2 12/01/2023   MCV 84.5 12/01/2023   PLT 47 (L) 12/01/2023      Chemistry      Component Value Date/Time   NA 139 12/01/2023 0921   NA 142 01/28/2021 0922   NA  142 04/07/2017 0810   K 4.0 12/01/2023 0921   K 3.8 04/07/2017 0810   CL 104 12/01/2023 0921   CO2 29 12/01/2023 0921   CO2 28 04/07/2017 0810   BUN 17 12/01/2023 0921   BUN 17 01/28/2021 0922   BUN 16.8 04/07/2017 0810   CREATININE 0.77 12/01/2023 0921   CREATININE  0.9 04/07/2017 0810      Component Value Date/Time   CALCIUM  9.8 12/01/2023 0921   CALCIUM  9.9 04/07/2017 0810   ALKPHOS 86 12/01/2023 0921   ALKPHOS 79 04/07/2017 0810   AST 14 (L) 12/01/2023 0921   AST 18 04/07/2017 0810   ALT 14 12/01/2023 0921   ALT 23 04/07/2017 0810   BILITOT 1.2 12/01/2023 0921   BILITOT 0.70 04/07/2017 0810

## 2023-12-04 ENCOUNTER — Telehealth: Payer: Self-pay

## 2023-12-04 NOTE — Telephone Encounter (Signed)
 Called her back and per Dr. Bertis Ruddy instructed to increase vitamin D to 5000 units daily. She verbalized understanding.

## 2023-12-05 LAB — ANTINUCLEAR ANTIBODIES, IFA: ANA Ab, IFA: NEGATIVE

## 2023-12-09 ENCOUNTER — Ambulatory Visit (HOSPITAL_BASED_OUTPATIENT_CLINIC_OR_DEPARTMENT_OTHER)
Admission: RE | Admit: 2023-12-09 | Discharge: 2023-12-09 | Disposition: A | Discharge status: Home / Self Care | Payer: BC Managed Care – PPO | Source: Ambulatory Visit | Attending: Hematology and Oncology

## 2023-12-09 DIAGNOSIS — C549 Malignant neoplasm of corpus uteri, unspecified: Secondary | ICD-10-CM | POA: Insufficient documentation

## 2023-12-09 DIAGNOSIS — D696 Thrombocytopenia, unspecified: Secondary | ICD-10-CM | POA: Diagnosis not present

## 2023-12-09 DIAGNOSIS — Z90710 Acquired absence of both cervix and uterus: Secondary | ICD-10-CM | POA: Diagnosis not present

## 2023-12-09 DIAGNOSIS — Z9049 Acquired absence of other specified parts of digestive tract: Secondary | ICD-10-CM | POA: Diagnosis not present

## 2023-12-09 DIAGNOSIS — M069 Rheumatoid arthritis, unspecified: Secondary | ICD-10-CM | POA: Insufficient documentation

## 2023-12-09 DIAGNOSIS — I700 Atherosclerosis of aorta: Secondary | ICD-10-CM | POA: Diagnosis not present

## 2023-12-09 DIAGNOSIS — D1803 Hemangioma of intra-abdominal structures: Secondary | ICD-10-CM | POA: Diagnosis not present

## 2023-12-09 MED ORDER — IOHEXOL 300 MG/ML  SOLN
100.0000 mL | Freq: Once | INTRAMUSCULAR | Status: AC | PRN
  Administered 2023-12-09: 90 mL via INTRAVENOUS

## 2023-12-15 ENCOUNTER — Inpatient Hospital Stay: Payer: BC Managed Care – PPO | Admitting: Hematology and Oncology

## 2023-12-15 ENCOUNTER — Telehealth: Payer: Self-pay | Admitting: Oncology

## 2023-12-15 ENCOUNTER — Encounter: Payer: Self-pay | Admitting: Hematology and Oncology

## 2023-12-15 VITALS — BP 138/76 | HR 76 | Temp 97.4°F | Resp 18 | Ht 63.0 in | Wt 194.2 lb

## 2023-12-15 DIAGNOSIS — Z8542 Personal history of malignant neoplasm of other parts of uterus: Secondary | ICD-10-CM | POA: Diagnosis not present

## 2023-12-15 DIAGNOSIS — M069 Rheumatoid arthritis, unspecified: Secondary | ICD-10-CM

## 2023-12-15 DIAGNOSIS — D696 Thrombocytopenia, unspecified: Secondary | ICD-10-CM | POA: Diagnosis not present

## 2023-12-15 DIAGNOSIS — C549 Malignant neoplasm of corpus uteri, unspecified: Secondary | ICD-10-CM | POA: Diagnosis not present

## 2023-12-15 DIAGNOSIS — E559 Vitamin D deficiency, unspecified: Secondary | ICD-10-CM | POA: Diagnosis not present

## 2023-12-15 DIAGNOSIS — Z90710 Acquired absence of both cervix and uterus: Secondary | ICD-10-CM | POA: Diagnosis not present

## 2023-12-15 DIAGNOSIS — Z9221 Personal history of antineoplastic chemotherapy: Secondary | ICD-10-CM | POA: Diagnosis not present

## 2023-12-15 MED ORDER — PREDNISONE 20 MG PO TABS
20.0000 mg | ORAL_TABLET | Freq: Every day | ORAL | 0 refills | Status: DC
Start: 2023-12-15 — End: 2023-12-22

## 2023-12-15 NOTE — Progress Notes (Signed)
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[facility]) I have reviewed her CT which showed no evidence of recurrent cancer She is reassured  Rheumatoid arthritis (HCC) She tested positive for rheumatoid factor Her sed rate is within normal range and she is not symptomatic I suspect her thrombocytopenia is caused by rheumatoid arthritis  Thrombocytopenia (HCC) We discussed treatment options I think the simplest would be to start her on prednisone We discussed concern for weight gain while on prednisone and I recommend some dietary modification and lifestyle changes while she is on it I will start her on 60 mg daily and I will plan to see her next week for further follow-up  Orders Placed This Encounter  Procedures   CBC with Differential/Platelet    Standing Status:   Standing    Number of Occurrences:   22    Expiration Date:   12/14/2024    All questions were answered. The patient knows to call the clinic with any problems, questions or concerns. The total time spent in the appointment was 30 minutes encounter with patients including review of chart and various tests results, discussions about plan of care and coordination of care plan   Artis Delay, MD 12/15/2023 12:01 PM  INTERVAL HISTORY: Please see below for problem oriented charting. she returns for surveillance follow-up and review test results She is not symptomatic from recent low platelet count We discussed all test results including blood work and imaging study We discussed risk and benefit of prednisone therapy and she agreed to proceed  REVIEW OF SYSTEMS:   Constitutional: Denies fevers, chills or abnormal weight loss Eyes: Denies blurriness of vision Ears, nose, mouth, throat, and face: Denies mucositis or sore  throat Respiratory: Denies cough, dyspnea or wheezes Cardiovascular: Denies palpitation, chest discomfort or lower extremity swelling Gastrointestinal:  Denies nausea, heartburn or change in bowel habits Skin: Denies abnormal skin rashes Lymphatics: Denies new lymphadenopathy or easy bruising Neurological:Denies numbness, tingling or new weaknesses Behavioral/Psych: Mood is stable, no new changes  All other systems were reviewed with the patient and are negative.  I have reviewed the past medical history, past surgical history, social history and family history with the patient and they are unchanged from previous note.  ALLERGIES:  is allergic to latex, sulfa antibiotics, and elemental sulfur.  MEDICATIONS:  Current Outpatient Medications  Medication Sig Dispense Refill   predniSONE (DELTASONE) 20 MG tablet Take 1 tablet (20 mg total) by mouth daily with breakfast. 90 tablet 0   albuterol (VENTOLIN HFA) 108 (90 Base) MCG/ACT inhaler Inhale 2 puffs into the lungs every 6 (six) hours as needed for wheezing or shortness of breath. 8 g 2   Coenzyme Q10 (COQ10 PO) Take 2 each by mouth daily.     cyclobenzaprine (FLEXERIL) 10 MG tablet Take 1 tablet (10 mg total) by mouth at bedtime as needed for muscle spasms. 15 tablet 0   diclofenac Sodium (VOLTAREN) 1 % GEL Apply topically.     famotidine (PEPCID) 20 MG tablet TAKE 1 TABLET BY MOUTH TWICE DAILY FOR HEART 180 tablet 3   losartan (COZAAR) 50 MG tablet Take 1 tablet (50 mg total) by mouth daily. 90 tablet 3   Multiple Vitamin (MULTIVITAMIN ADULT PO) Take by mouth.     rosuvastatin (CRESTOR) 5 MG tablet Take 1 tablet (  5 mg total) by mouth daily. Please keep your upcoming appointment for refills. 90 tablet 0   tirzepatide (MOUNJARO) 7.5 MG/0.5ML Pen Inject 7.5 mg into the skin once a week. 6 mL 3   triamcinolone cream (KENALOG) 0.1 % Apply 1 Application topically 2 (two) times daily. 30 g 0   VITAMIN D PO Take 5,000 Units by mouth daily.      No current facility-administered medications for this visit.    SUMMARY OF ONCOLOGIC HISTORY: Oncology History Overview Note  [facility] stable   Adenosarcoma of body of uterus (HCC)  03/03/2020 Initial Diagnosis   She developed PMB in early April after going through menopause at approximately age 66. She saw her PCP on 4/13 for bleeding and an ultrasound was obtained showing a thickened endometrial lining and 3-4cm mass within the endocervical canal. She was referred to [facility], started on Megace and ultimately underwent a TRH/BSO on 5/18 for abnormal uterine bleeding with resulting anemia, large prolapsing fibroid.    03/06/2020 Imaging   US pelvis Markedly thickened heterogeneous, and hypervascular endometrial complex up to 38 mm thick highly concerning for endometrial neoplasm.   Additional more focal 3.0 x 2.8 x 5.3 cm diameter mass within endocervical canal, may represent extension of above endometrial pathology versus separate endocervical tumor.   Tissue diagnosis recommended.   Nonvisualization of ovaries.   04/07/2020 Pathology Results   A. UTERUS, CERVIX AND BILATERAL FALLOPIAN TUBES AND OVARIES, HYSTERECTOMY AND BILATERAL SALPINGO-OOPHORECTOMY: - Mullerian adenosarcoma of endometrium. - Tumor limited to the uterus. - Tumor invades to less than half of the myometrium. - No involvement of adnexa. - Margins of resection are not involved. - See oncology table and comment. ONCOLOGY TABLE: UTERUS, SARCOMA: Procedure: Total hysterectomy and bilateral salpingo-oophorectomy Specimen Integrity: The uterus, cervix and bilateral fallopian tubes and ovaries were received intact. The nodule clinically identified as vaginally prolapsed uterine fibroid was received separate and disrupted. Tumor Size: Greatest dimension: 7.5 cm Histologic Type: Mullerian adenosarcoma with overgrowth of the epithelial and stromal components Histologic Grade: The epithelial component has proliferated to form  endometrioid adenocarcinoma which ranges from low grade to high grade. The stromal component focally shows sarcomatous overgrowth. Myometrial Invasion: Present Depth of invasion: 4 mm Myometrial thickness: 30 mm Other Tissue/Organ Involvement: Not identified Margins: Uninvolved by sarcoma Lymphovascular Invasion: Not identified Regional Lymph Nodes: No lymph nodes submitted or found Pathologic Stage Classification (pTNM, AJCC 8th Edition): pT1b, pNX Additional Pathologic Findings: Adenomyosis. Leiomyomata.   04/07/2020 Surgery   PRE-OPERATIVE DIAGNOSIS:  Large prolapsed fibroid vaginally, menometrorrhagia with anemia   POST-OPERATIVE DIAGNOSIS:  Large prolapsed fibroid vaginally, menometrorrhagia with anemia.  Omental adhesions with anterior abdominal wall.   PROCEDURE:  Procedure(s): XI ROBOTIC TOTAL LAPAROSCOPIC HYSTERECTOMY, BILATERAL SALPINGO OOPHORECTOMY/LYSIS OF ADHESIONS/VAGINAL MYOMECTOMY    FINDINGS: Large necrotic vaginally prolapsed uterine myoma.  Uterus with small subserosal/intramural myoma.  Bilateral tubes post tubal ligation.  Bilateral normal ovaries.  Omental adhesions with anterior abdominal wall.   05/01/2020 Imaging   Ct scan of chest, abdomen and pelvis 1. Multiple ill-defined low density liver masses, suspicious for metastases. These could be further characterized with pre and postcontrast magnetic resonance imaging of the liver. 2. Moderate diffuse vaginal wall thickening, possibly representing edema associated with the patient's recent surgery. 3. Small hiatal hernia. 4. Minimal coronary artery atheromatous calcifications. 5. Cholelithiasis. 6. Tiny rounded area of low density in the posterior spleen, too small to characterize.   05/11/2020 Cancer Staging   Staging form: Corpus Uteri - Adenosarcoma, AJCC 8th  Edition - Pathologic: Stage IB (pT1b, pN0, cM0) - Signed by Artis Delay, MD on 05/15/2020   05/14/2020 Imaging   MRI liver 1. The 2 dominant right  hepatic lobe lesions have MR imaging features most consistent with benign cavernous hemangioma. The remaining tiny liver lesions are too small to characterize. Statistically, while these are likely benign, follow-up MRI in 3 months recommended to ensure stability. 2. No other findings to suggest metastatic disease.   05/22/2020 Procedure   Placement of a subcutaneous port device. Catheter tip at the superior cavoatrial junction   05/29/2020 - 09/11/2020 Chemotherapy   The patient had carboplatin and taxol for chemotherapy treatment.     07/22/2020 Imaging   1. Status post hysterectomy and bilateral oophorectomy. Irregular soft tissue density in the region of the vaginal cuff is relatively similar to on the prior exam. Although this could represent developing scar or chronic hematoma, residual disease cannot be excluded. This could either be re-evaluated at follow-up or if a more aggressive approach is desired, characterized with PET. 2. No other evidence of metastatic disease in the abdomen or pelvis. 3. Hepatic hemangiomas, as before. Smaller liver lesions are unchanged, favoring a benign etiology. 4. Cholelithiasis. 5. Tiny hiatal hernia. 6. Aortic Atherosclerosis (ICD10-I70.0).   10/12/2020 Imaging   1. Stable appearance of the vaginal cuff status post hysterectomy. Soft tissue fullness appears unchanged, likely postsurgical in etiology. Correlate with physical examination and tumor markers. 2. No evidence of metastatic disease. 3. Stable hepatic hemangiomas. 4. Cholelithiasis without evidence of cholecystitis or biliary dilatation. 5. Aortic Atherosclerosis (ICD10-I70.0).   04/12/2021 Imaging   1. Status post hysterectomy. Unchanged appearance of somewhat nodular soft tissue at the left and right aspects of the vaginal cuff, again favored to represent postoperative change without specific findings or evidence of progression to suggest local malignant recurrence. 2. No noncontrast evidence of  metastatic disease in the abdomen or pelvis. 3. Cholelithiasis.   Aortic Atherosclerosis (ICD10-I70.0).   04/30/2021 Procedure   Removal of implanted Port-A-Cath utilizing sharp and blunt dissection. The procedure was uncomplicated.   10/31/2022 Imaging   1. Stable CT of the abdomen and pelvis. No specific findings identified to suggest residual or recurrent tumor or metastatic disease. 2. Stable liver hemangiomas. 3. Gallstone.   12/09/2023 Imaging   CT CHEST ABDOMEN PELVIS W CONTRAST Result Date: 12/10/2023 CLINICAL DATA:  Uterine adenosarcoma restaging, ITP * Tracking Code: BO * EXAM: CT CHEST, ABDOMEN, AND PELVIS WITH CONTRAST TECHNIQUE: Multidetector CT imaging of the chest, abdomen and pelvis was performed following the standard protocol during bolus administration of intravenous contrast. RADIATION DOSE REDUCTION: This exam was performed according to the departmental dose-optimization program which includes automated exposure control, adjustment of the mA and/or kV according to patient size and/or use of iterative reconstruction technique. CONTRAST:  90mL OMNIPAQUE IOHEXOL 300 MG/ML  SOLN COMPARISON:  CT abdomen pelvis, 10/27/2022, CT chest, 05/01/2020 MR abdomen, 05/14/2020 FINDINGS: CT CHEST FINDINGS Cardiovascular: No significant vascular findings. Normal heart size. No pericardial effusion. Mediastinum/Nodes: No enlarged mediastinal, hilar, or axillary lymph nodes. Unchanged thymic remnant or rebound in the anterior mediastinum. Thyroid gland, trachea, and esophagus demonstrate no significant findings. Lungs/Pleura: Lungs are clear. No pleural effusion or pneumothorax. Musculoskeletal: No chest wall abnormality. No acute osseous findings. CT ABDOMEN PELVIS FINDINGS Hepatobiliary: Unchanged hepatic hemangiomas of the liver dome, previously characterized by MR, benign, requiring no further follow-up or characterization (series 2, image 43). Status post cholecystectomy. No biliary dilatation.  Pancreas: Unremarkable. No pancreatic ductal dilatation or surrounding  inflammatory changes. Spleen: Normal in size without significant abnormality. Adrenals/Urinary Tract: Adrenal glands are unremarkable. Kidneys are normal, without renal calculi, solid lesion, or hydronephrosis. Bladder is unremarkable. Stomach/Bowel: Stomach is within normal limits. Appendix appears normal. No evidence of bowel wall thickening, distention, or inflammatory changes. Vascular/Lymphatic: Scattered aortic atherosclerosis. No enlarged abdominal or pelvic lymph nodes. Reproductive: Unchanged postoperative appearance of the pelvis status post hysterectomy (series 2, image 104). Other: No abdominal wall hernia or abnormality. No ascites. Musculoskeletal: No acute osseous findings. IMPRESSION: 1. Unchanged postoperative appearance of the pelvis status post hysterectomy. 2. No evidence of lymphadenopathy or metastatic disease in the chest, abdomen, or pelvis. 3. Status post cholecystectomy. Aortic Atherosclerosis (ICD10-I70.0). Electronically Signed   By: Jearld Lesch M.D.   On: 12/10/2023 08:04        PHYSICAL EXAMINATION: ECOG PERFORMANCE STATUS: 0 - Asymptomatic  Vitals:   12/15/23 1000  BP: 138/76  Pulse: 76  Resp: 18  Temp: (!) 97.4 F (36.3 C)  SpO2: 98%   Filed Weights   12/15/23 1000  Weight: 194 lb 3.2 oz (88.1 kg)    GENERAL:alert, no distress and comfortable  LABORATORY DATA:  I have reviewed the data as listed    Component Value Date/Time   NA 139 12/01/2023 0921   NA 142 01/28/2021 0922   NA 142 04/07/2017 0810   K 4.0 12/01/2023 0921   K 3.8 04/07/2017 0810   CL 104 12/01/2023 0921   CO2 29 12/01/2023 0921   CO2 28 04/07/2017 0810   GLUCOSE 91 12/01/2023 0921   GLUCOSE 100 04/07/2017 0810   BUN 17 12/01/2023 0921   BUN 17 01/28/2021 0922   BUN 16.8 04/07/2017 0810   CREATININE 0.77 12/01/2023 0921   CREATININE 0.9 04/07/2017 0810   CALCIUM 9.8 12/01/2023 0921   CALCIUM 9.9  04/07/2017 0810   PROT 8.2 (H) 12/01/2023 0921   PROT 7.6 01/28/2021 0922   PROT 8.0 04/07/2017 0810   ALBUMIN 4.8 12/01/2023 0921   ALBUMIN 4.8 01/28/2021 0922   ALBUMIN 4.4 04/07/2017 0810   AST 14 (L) 12/01/2023 0921   AST 18 04/07/2017 0810   ALT 14 12/01/2023 0921   ALT 23 04/07/2017 0810   ALKPHOS 86 12/01/2023 0921   ALKPHOS 79 04/07/2017 0810   BILITOT 1.2 12/01/2023 0921   BILITOT 0.70 04/07/2017 0810   GFRNONAA >60 12/01/2023 0921   GFRAA >60 08/21/2020 0805    No results found for: "SPEP", "UPEP"  Lab Results  Component Value Date   WBC 5.7 12/01/2023   NEUTROABS 2.5 12/01/2023   HGB 12.3 12/01/2023   HCT 38.2 12/01/2023   MCV 84.5 12/01/2023   PLT 47 (L) 12/01/2023      Chemistry      Component Value Date/Time   NA 139 12/01/2023 0921   NA 142 01/28/2021 0922   NA 142 04/07/2017 0810   K 4.0 12/01/2023 0921   K 3.8 04/07/2017 0810   CL 104 12/01/2023 0921   CO2 29 12/01/2023 0921   CO2 28 04/07/2017 0810   BUN 17 12/01/2023 0921   BUN 17 01/28/2021 0922   BUN 16.8 04/07/2017 0810   CREATININE 0.77 12/01/2023 0921   CREATININE 0.9 04/07/2017 0810      Component Value Date/Time   CALCIUM 9.8 12/01/2023 0921   CALCIUM 9.9 04/07/2017 0810   ALKPHOS 86 12/01/2023 0921   ALKPHOS 79 04/07/2017 0810   AST 14 (L) 12/01/2023 0921   AST 18 04/07/2017 0810  ALT 14 12/01/2023 0921   ALT 23 04/07/2017 0810   BILITOT 1.2 12/01/2023 0921   BILITOT 0.70 04/07/2017 0810       RADIOGRAPHIC STUDIES: I have personally reviewed the radiological images as listed and agreed with the findings in the report. CT CHEST ABDOMEN PELVIS W CONTRAST Result Date: 12/10/2023 CLINICAL DATA:  Uterine adenosarcoma restaging, ITP * Tracking Code: BO * EXAM: CT CHEST, ABDOMEN, AND PELVIS WITH CONTRAST TECHNIQUE: Multidetector CT imaging of the chest, abdomen and pelvis was performed following the standard protocol during bolus administration of intravenous contrast. RADIATION  DOSE REDUCTION: This exam was performed according to the departmental dose-optimization program which includes automated exposure control, adjustment of the mA and/or kV according to patient size and/or use of iterative reconstruction technique. CONTRAST:  90mL OMNIPAQUE IOHEXOL 300 MG/ML  SOLN COMPARISON:  CT abdomen pelvis, 10/27/2022, CT chest, 05/01/2020 MR abdomen, 05/14/2020 FINDINGS: CT CHEST FINDINGS Cardiovascular: No significant vascular findings. Normal heart size. No pericardial effusion. Mediastinum/Nodes: No enlarged mediastinal, hilar, or axillary lymph nodes. Unchanged thymic remnant or rebound in the anterior mediastinum. Thyroid gland, trachea, and esophagus demonstrate no significant findings. Lungs/Pleura: Lungs are clear. No pleural effusion or pneumothorax. Musculoskeletal: No chest wall abnormality. No acute osseous findings. CT ABDOMEN PELVIS FINDINGS Hepatobiliary: Unchanged hepatic hemangiomas of the liver dome, previously characterized by MR, benign, requiring no further follow-up or characterization (series 2, image 43). Status post cholecystectomy. No biliary dilatation. Pancreas: Unremarkable. No pancreatic ductal dilatation or surrounding inflammatory changes. Spleen: Normal in size without significant abnormality. Adrenals/Urinary Tract: Adrenal glands are unremarkable. Kidneys are normal, without renal calculi, solid lesion, or hydronephrosis. Bladder is unremarkable. Stomach/Bowel: Stomach is within normal limits. Appendix appears normal. No evidence of bowel wall thickening, distention, or inflammatory changes. Vascular/Lymphatic: Scattered aortic atherosclerosis. No enlarged abdominal or pelvic lymph nodes. Reproductive: Unchanged postoperative appearance of the pelvis status post hysterectomy (series 2, image 104). Other: No abdominal wall hernia or abnormality. No ascites. Musculoskeletal: No acute osseous findings. IMPRESSION: 1. Unchanged postoperative appearance of the pelvis  status post hysterectomy. 2. No evidence of lymphadenopathy or metastatic disease in the chest, abdomen, or pelvis. 3. Status post cholecystectomy. Aortic Atherosclerosis (ICD10-I70.0). Electronically Signed   By: Jearld Lesch M.D.   On: 12/10/2023 08:04

## 2023-12-15 NOTE — Telephone Encounter (Signed)
[facility] and schedule an appointment with Dr. Antionette Char on 02/14/24 at 9:15.

## 2023-12-15 NOTE — Assessment & Plan Note (Signed)
We discussed treatment options I think the simplest would be to start her on prednisone We discussed concern for weight gain while on prednisone and I recommend some dietary modification and lifestyle changes while she is on it I will start her on 60 mg daily and I will plan to see her next week for further follow-up

## 2023-12-15 NOTE — Assessment & Plan Note (Signed)
I have reviewed her CT which showed no evidence of recurrent cancer She is reassured

## 2023-12-15 NOTE — Assessment & Plan Note (Signed)
She tested positive for rheumatoid factor Her sed rate is within normal range and she is not symptomatic I suspect her thrombocytopenia is caused by rheumatoid arthritis

## 2023-12-22 ENCOUNTER — Inpatient Hospital Stay: Payer: BC Managed Care – PPO

## 2023-12-22 ENCOUNTER — Inpatient Hospital Stay: Payer: BC Managed Care – PPO | Admitting: Hematology and Oncology

## 2023-12-22 ENCOUNTER — Encounter: Payer: Self-pay | Admitting: Hematology and Oncology

## 2023-12-22 VITALS — BP 147/77 | HR 65 | Temp 97.9°F | Resp 18 | Ht 63.0 in | Wt 198.4 lb

## 2023-12-22 DIAGNOSIS — M069 Rheumatoid arthritis, unspecified: Secondary | ICD-10-CM | POA: Diagnosis not present

## 2023-12-22 DIAGNOSIS — K5909 Other constipation: Secondary | ICD-10-CM | POA: Diagnosis not present

## 2023-12-22 DIAGNOSIS — E559 Vitamin D deficiency, unspecified: Secondary | ICD-10-CM | POA: Diagnosis not present

## 2023-12-22 DIAGNOSIS — D696 Thrombocytopenia, unspecified: Secondary | ICD-10-CM

## 2023-12-22 DIAGNOSIS — Z8542 Personal history of malignant neoplasm of other parts of uterus: Secondary | ICD-10-CM | POA: Diagnosis not present

## 2023-12-22 DIAGNOSIS — Z9221 Personal history of antineoplastic chemotherapy: Secondary | ICD-10-CM | POA: Diagnosis not present

## 2023-12-22 DIAGNOSIS — Z90710 Acquired absence of both cervix and uterus: Secondary | ICD-10-CM | POA: Diagnosis not present

## 2023-12-22 LAB — CBC WITH DIFFERENTIAL/PLATELET
Abs Immature Granulocytes: 0.04 10*3/uL (ref 0.00–0.07)
Basophils Absolute: 0 10*3/uL (ref 0.0–0.1)
Basophils Relative: 0 %
Eosinophils Absolute: 0 10*3/uL (ref 0.0–0.5)
Eosinophils Relative: 0 %
HCT: 38.5 % (ref 36.0–46.0)
Hemoglobin: 12.3 g/dL (ref 12.0–15.0)
Immature Granulocytes: 0 %
Lymphocytes Relative: 39 %
Lymphs Abs: 4.7 10*3/uL — ABNORMAL HIGH (ref 0.7–4.0)
MCH: 26.6 pg (ref 26.0–34.0)
MCHC: 31.9 g/dL (ref 30.0–36.0)
MCV: 83.2 fL (ref 80.0–100.0)
Monocytes Absolute: 1.1 10*3/uL — ABNORMAL HIGH (ref 0.1–1.0)
Monocytes Relative: 9 %
Neutro Abs: 6.3 10*3/uL (ref 1.7–7.7)
Neutrophils Relative %: 52 %
Platelets: 108 10*3/uL — ABNORMAL LOW (ref 150–400)
RBC: 4.63 MIL/uL (ref 3.87–5.11)
RDW: 14.6 % (ref 11.5–15.5)
WBC: 12.2 10*3/uL — ABNORMAL HIGH (ref 4.0–10.5)
nRBC: 0 % (ref 0.0–0.2)

## 2023-12-22 MED ORDER — PREDNISONE 20 MG PO TABS: ORAL_TABLET | ORAL | Status: DC

## 2023-12-22 NOTE — Progress Notes (Signed)
[facility] OFFICE PROGRESS NOTE  Excell Seltzer, MD  ASSESSMENT & PLAN:  Thrombocytopenia (HCC) She is responding very well to low-dose prednisone I recommend mild prednisone taper She will take 10 mg alternate with 20 mg every other day She will return next week for lab appointment and we will discuss further dose adjustment  Other constipation She had recent constipation due to dietary modification I recommend gentle laxatives  No orders of the defined types were placed in this encounter.   The total time spent in the appointment was 20 minutes encounter with patients including review of chart and various tests results, discussions about plan of care and coordination of care plan   All questions were answered. The patient knows to call the clinic with any problems, questions or concerns. No barriers to learning was detected.    Artis Delay, MD 1/31/202510:52 AM  INTERVAL HISTORY: Rachel Rivas 63 y.o. female returns for further follow-up for recent treatment for recurrent ITP She tolerated prednisone well She noted some mild constipation with dietary changes She is motivated to lose weight We discussed CBC results  SUMMARY OF HEMATOLOGIC HISTORY: Oncology History Overview Note  MSI stable   Adenosarcoma of body of uterus (HCC)  03/03/2020 Initial Diagnosis   She developed PMB in early April after going through menopause at approximately age 49. She saw her PCP on 4/13 for bleeding and an ultrasound was obtained showing a thickened endometrial lining and 3-4cm mass within the endocervical canal. She was referred to [facility], started on Megace and ultimately underwent a TRH/BSO on 5/18 for abnormal uterine bleeding with resulting anemia, large prolapsing fibroid.    03/06/2020 Imaging   US pelvis Markedly thickened heterogeneous, and hypervascular endometrial complex up to 38 mm thick highly concerning for endometrial neoplasm.   Additional more focal 3.0 x  2.8 x 5.3 cm diameter mass within endocervical canal, may represent extension of above endometrial pathology versus separate endocervical tumor.   Tissue diagnosis recommended.   Nonvisualization of ovaries.   04/07/2020 Pathology Results   A. UTERUS, CERVIX AND BILATERAL FALLOPIAN TUBES AND OVARIES, HYSTERECTOMY AND BILATERAL SALPINGO-OOPHORECTOMY: - Mullerian adenosarcoma of endometrium. - Tumor limited to the uterus. - Tumor invades to less than half of the myometrium. - No involvement of adnexa. - Margins of resection are not involved. - See oncology table and comment. ONCOLOGY TABLE: UTERUS, SARCOMA: Procedure: Total hysterectomy and bilateral salpingo-oophorectomy Specimen Integrity: The uterus, cervix and bilateral fallopian tubes and ovaries were received intact. The nodule clinically identified as vaginally prolapsed uterine fibroid was received separate and disrupted. Tumor Size: Greatest dimension: 7.5 cm Histologic Type: Mullerian adenosarcoma with overgrowth of the epithelial and stromal components Histologic Grade: The epithelial component has proliferated to form endometrioid adenocarcinoma which ranges from low grade to high grade. The stromal component focally shows sarcomatous overgrowth. Myometrial Invasion: Present Depth of invasion: 4 mm Myometrial thickness: 30 mm Other Tissue/Organ Involvement: Not identified Margins: Uninvolved by sarcoma Lymphovascular Invasion: Not identified Regional Lymph Nodes: No lymph nodes submitted or found Pathologic Stage Classification (pTNM, AJCC 8th Edition): pT1b, pNX Additional Pathologic Findings: Adenomyosis. Leiomyomata.   04/07/2020 Surgery   PRE-OPERATIVE DIAGNOSIS:  Large prolapsed fibroid vaginally, menometrorrhagia with anemia   POST-OPERATIVE DIAGNOSIS:  Large prolapsed fibroid vaginally, menometrorrhagia with anemia.  Omental adhesions with anterior abdominal wall.   PROCEDURE:  Procedure(s): XI ROBOTIC TOTAL  LAPAROSCOPIC HYSTERECTOMY, BILATERAL SALPINGO OOPHORECTOMY/LYSIS OF ADHESIONS/VAGINAL MYOMECTOMY    FINDINGS: Large necrotic vaginally prolapsed uterine myoma.  Uterus  with small subserosal/intramural myoma.  Bilateral tubes post tubal ligation.  Bilateral normal ovaries.  Omental adhesions with anterior abdominal wall.   05/01/2020 Imaging   Ct scan of chest, abdomen and pelvis 1. Multiple ill-defined low density liver masses, suspicious for metastases. These could be further characterized with pre and postcontrast magnetic resonance imaging of the liver. 2. Moderate diffuse vaginal wall thickening, possibly representing edema associated with the patient's recent surgery. 3. Small hiatal hernia. 4. Minimal coronary artery atheromatous calcifications. 5. Cholelithiasis. 6. Tiny rounded area of low density in the posterior spleen, too small to characterize.   05/11/2020 Cancer Staging   Staging form: Corpus Uteri - Adenosarcoma, AJCC 8th Edition - Pathologic: Stage IB (pT1b, pN0, cM0) - Signed by Artis Delay, MD on 05/15/2020   05/14/2020 Imaging   MRI liver 1. The 2 dominant right hepatic lobe lesions have MR imaging features most consistent with benign cavernous hemangioma. The remaining tiny liver lesions are too small to characterize. Statistically, while these are likely benign, follow-up MRI in 3 months recommended to ensure stability. 2. No other findings to suggest metastatic disease.   05/22/2020 Procedure   Placement of a subcutaneous port device. Catheter tip at the superior cavoatrial junction   05/29/2020 - 09/11/2020 Chemotherapy   The patient had carboplatin and taxol for chemotherapy treatment.     07/22/2020 Imaging   1. Status post hysterectomy and bilateral oophorectomy. Irregular soft tissue density in the region of the vaginal cuff is relatively similar to on the prior exam. Although this could represent developing scar or chronic hematoma, residual disease cannot be excluded.  This could either be re-evaluated at follow-up or if a more aggressive approach is desired, characterized with PET. 2. No other evidence of metastatic disease in the abdomen or pelvis. 3. Hepatic hemangiomas, as before. Smaller liver lesions are unchanged, favoring a benign etiology. 4. Cholelithiasis. 5. Tiny hiatal hernia. 6. Aortic Atherosclerosis (ICD10-I70.0).   10/12/2020 Imaging   1. Stable appearance of the vaginal cuff status post hysterectomy. Soft tissue fullness appears unchanged, likely postsurgical in etiology. Correlate with physical examination and tumor markers. 2. No evidence of metastatic disease. 3. Stable hepatic hemangiomas. 4. Cholelithiasis without evidence of cholecystitis or biliary dilatation. 5. Aortic Atherosclerosis (ICD10-I70.0).   04/12/2021 Imaging   1. Status post hysterectomy. Unchanged appearance of somewhat nodular soft tissue at the left and right aspects of the vaginal cuff, again favored to represent postoperative change without specific findings or evidence of progression to suggest local malignant recurrence. 2. No noncontrast evidence of metastatic disease in the abdomen or pelvis. 3. Cholelithiasis.   Aortic Atherosclerosis (ICD10-I70.0).   04/30/2021 Procedure   Removal of implanted Port-A-Cath utilizing sharp and blunt dissection. The procedure was uncomplicated.   10/31/2022 Imaging   1. Stable CT of the abdomen and pelvis. No specific findings identified to suggest residual or recurrent tumor or metastatic disease. 2. Stable liver hemangiomas. 3. Gallstone.   12/09/2023 Imaging   CT CHEST ABDOMEN PELVIS W CONTRAST Result Date: 12/10/2023 CLINICAL DATA:  Uterine adenosarcoma restaging, ITP * Tracking Code: BO * EXAM: CT CHEST, ABDOMEN, AND PELVIS WITH CONTRAST TECHNIQUE: Multidetector CT imaging of the chest, abdomen and pelvis was performed following the standard protocol during bolus administration of intravenous contrast. RADIATION DOSE  REDUCTION: This exam was performed according to the departmental dose-optimization program which includes automated exposure control, adjustment of the mA and/or kV according to patient size and/or use of iterative reconstruction technique. CONTRAST:  90mL OMNIPAQUE IOHEXOL 300 MG/ML  SOLN COMPARISON:  CT abdomen pelvis, 10/27/2022, CT chest, 05/01/2020 MR abdomen, 05/14/2020 FINDINGS: CT CHEST FINDINGS Cardiovascular: No significant vascular findings. Normal heart size. No pericardial effusion. Mediastinum/Nodes: No enlarged mediastinal, hilar, or axillary lymph nodes. Unchanged thymic remnant or rebound in the anterior mediastinum. Thyroid gland, trachea, and esophagus demonstrate no significant findings. Lungs/Pleura: Lungs are clear. No pleural effusion or pneumothorax. Musculoskeletal: No chest wall abnormality. No acute osseous findings. CT ABDOMEN PELVIS FINDINGS Hepatobiliary: Unchanged hepatic hemangiomas of the liver dome, previously characterized by MR, benign, requiring no further follow-up or characterization (series 2, image 43). Status post cholecystectomy. No biliary dilatation. Pancreas: Unremarkable. No pancreatic ductal dilatation or surrounding inflammatory changes. Spleen: Normal in size without significant abnormality. Adrenals/Urinary Tract: Adrenal glands are unremarkable. Kidneys are normal, without renal calculi, solid lesion, or hydronephrosis. Bladder is unremarkable. Stomach/Bowel: Stomach is within normal limits. Appendix appears normal. No evidence of bowel wall thickening, distention, or inflammatory changes. Vascular/Lymphatic: Scattered aortic atherosclerosis. No enlarged abdominal or pelvic lymph nodes. Reproductive: Unchanged postoperative appearance of the pelvis status post hysterectomy (series 2, image 104). Other: No abdominal wall hernia or abnormality. No ascites. Musculoskeletal: No acute osseous findings. IMPRESSION: 1. Unchanged postoperative appearance of the pelvis  status post hysterectomy. 2. No evidence of lymphadenopathy or metastatic disease in the chest, abdomen, or pelvis. 3. Status post cholecystectomy. Aortic Atherosclerosis (ICD10-I70.0). Electronically Signed   By: Jearld Lesch M.D.   On: 12/10/2023 08:04         I have reviewed the past medical history, past surgical history, social history and family history with the patient and they are unchanged from previous note.  ALLERGIES:  is allergic to latex, sulfa antibiotics, and elemental sulfur.  MEDICATIONS:  Current Outpatient Medications  Medication Sig Dispense Refill   albuterol (VENTOLIN HFA) 108 (90 Base) MCG/ACT inhaler Inhale 2 puffs into the lungs every 6 (six) hours as needed for wheezing or shortness of breath. 8 g 2   Coenzyme Q10 (COQ10 PO) Take 2 each by mouth daily.     cyclobenzaprine (FLEXERIL) 10 MG tablet Take 1 tablet (10 mg total) by mouth at bedtime as needed for muscle spasms. 15 tablet 0   diclofenac Sodium (VOLTAREN) 1 % GEL Apply topically.     famotidine (PEPCID) 20 MG tablet TAKE 1 TABLET BY MOUTH TWICE DAILY FOR HEART 180 tablet 3   losartan (COZAAR) 50 MG tablet Take 1 tablet (50 mg total) by mouth daily. 90 tablet 3   Multiple Vitamin (MULTIVITAMIN ADULT PO) Take by mouth.     predniSONE (DELTASONE) 20 MG tablet 10 mg alternate with 20 mg every other day     rosuvastatin (CRESTOR) 5 MG tablet Take 1 tablet (5 mg total) by mouth daily. Please keep your upcoming appointment for refills. 90 tablet 0   tirzepatide (MOUNJARO) 7.5 MG/0.5ML Pen Inject 7.5 mg into the skin once a week. 6 mL 3   triamcinolone cream (KENALOG) 0.1 % Apply 1 Application topically 2 (two) times daily. 30 g 0   VITAMIN D PO Take 5,000 Units by mouth daily.     No current facility-administered medications for this visit.     REVIEW OF SYSTEMS:   Constitutional: Denies fevers, chills or night sweats Eyes: Denies blurriness of vision Ears, nose, mouth, throat, and face: Denies mucositis  or sore throat Respiratory: Denies cough, dyspnea or wheezes Cardiovascular: Denies palpitation, chest discomfort or lower extremity swelling Gastrointestinal:  Denies nausea, heartburn or change in bowel habits Skin: Denies abnormal skin  rashes Lymphatics: Denies new lymphadenopathy or easy bruising Neurological:Denies numbness, tingling or new weaknesses Behavioral/Psych: Mood is stable, no new changes  All other systems were reviewed with the patient and are negative.  PHYSICAL EXAMINATION: ECOG PERFORMANCE STATUS: 0 - Asymptomatic  Vitals:   12/22/23 0958  BP: (!) 147/77  Pulse: 65  Resp: 18  Temp: 97.9 F (36.6 C)  SpO2: 100%   Filed Weights   12/22/23 0958  Weight: 198 lb 6.4 oz (90 kg)    GENERAL:alert, no distress and comfortable LABORATORY DATA:  I have reviewed the data as listed     Component Value Date/Time   NA 139 12/01/2023 0921   NA 142 01/28/2021 0922   NA 142 04/07/2017 0810   K 4.0 12/01/2023 0921   K 3.8 04/07/2017 0810   CL 104 12/01/2023 0921   CO2 29 12/01/2023 0921   CO2 28 04/07/2017 0810   GLUCOSE 91 12/01/2023 0921   GLUCOSE 100 04/07/2017 0810   BUN 17 12/01/2023 0921   BUN 17 01/28/2021 0922   BUN 16.8 04/07/2017 0810   CREATININE 0.77 12/01/2023 0921   CREATININE 0.9 04/07/2017 0810   CALCIUM 9.8 12/01/2023 0921   CALCIUM 9.9 04/07/2017 0810   PROT 8.2 (H) 12/01/2023 0921   PROT 7.6 01/28/2021 0922   PROT 8.0 04/07/2017 0810   ALBUMIN 4.8 12/01/2023 0921   ALBUMIN 4.8 01/28/2021 0922   ALBUMIN 4.4 04/07/2017 0810   AST 14 (L) 12/01/2023 0921   AST 18 04/07/2017 0810   ALT 14 12/01/2023 0921   ALT 23 04/07/2017 0810   ALKPHOS 86 12/01/2023 0921   ALKPHOS 79 04/07/2017 0810   BILITOT 1.2 12/01/2023 0921   BILITOT 0.70 04/07/2017 0810   GFRNONAA >60 12/01/2023 0921   GFRAA >60 08/21/2020 0805    No results found for: "SPEP", "UPEP"  Lab Results  Component Value Date   WBC 12.2 (H) 12/22/2023   NEUTROABS 6.3 12/22/2023    HGB 12.3 12/22/2023   HCT 38.5 12/22/2023   MCV 83.2 12/22/2023   PLT 108 (L) 12/22/2023      Chemistry      Component Value Date/Time   NA 139 12/01/2023 0921   NA 142 01/28/2021 0922   NA 142 04/07/2017 0810   K 4.0 12/01/2023 0921   K 3.8 04/07/2017 0810   CL 104 12/01/2023 0921   CO2 29 12/01/2023 0921   CO2 28 04/07/2017 0810   BUN 17 12/01/2023 0921   BUN 17 01/28/2021 0922   BUN 16.8 04/07/2017 0810   CREATININE 0.77 12/01/2023 0921   CREATININE 0.9 04/07/2017 0810      Component Value Date/Time   CALCIUM 9.8 12/01/2023 0921   CALCIUM 9.9 04/07/2017 0810   ALKPHOS 86 12/01/2023 0921   ALKPHOS 79 04/07/2017 0810   AST 14 (L) 12/01/2023 0921   AST 18 04/07/2017 0810   ALT 14 12/01/2023 0921   ALT 23 04/07/2017 0810   BILITOT 1.2 12/01/2023 0921   BILITOT 0.70 04/07/2017 0810

## 2023-12-22 NOTE — Assessment & Plan Note (Signed)
She had recent constipation due to dietary modification I recommend gentle laxatives

## 2023-12-22 NOTE — Assessment & Plan Note (Signed)
She is responding very well to low-dose prednisone I recommend mild prednisone taper She will take 10 mg alternate with 20 mg every other day She will return next week for lab appointment and we will discuss further dose adjustment

## 2023-12-29 ENCOUNTER — Telehealth: Payer: Self-pay | Admitting: Hematology and Oncology

## 2023-12-29 ENCOUNTER — Inpatient Hospital Stay: Payer: BC Managed Care – PPO | Attending: Hematology and Oncology

## 2023-12-29 ENCOUNTER — Other Ambulatory Visit: Payer: Self-pay | Admitting: Hematology and Oncology

## 2023-12-29 DIAGNOSIS — D696 Thrombocytopenia, unspecified: Secondary | ICD-10-CM

## 2023-12-29 DIAGNOSIS — D693 Immune thrombocytopenic purpura: Secondary | ICD-10-CM | POA: Diagnosis not present

## 2023-12-29 DIAGNOSIS — Z9221 Personal history of antineoplastic chemotherapy: Secondary | ICD-10-CM | POA: Diagnosis not present

## 2023-12-29 DIAGNOSIS — Z90710 Acquired absence of both cervix and uterus: Secondary | ICD-10-CM | POA: Diagnosis not present

## 2023-12-29 DIAGNOSIS — Z8542 Personal history of malignant neoplasm of other parts of uterus: Secondary | ICD-10-CM | POA: Diagnosis not present

## 2023-12-29 DIAGNOSIS — Z5112 Encounter for antineoplastic immunotherapy: Secondary | ICD-10-CM | POA: Insufficient documentation

## 2023-12-29 LAB — CBC WITH DIFFERENTIAL/PLATELET
Abs Immature Granulocytes: 0.07 10*3/uL (ref 0.00–0.07)
Basophils Absolute: 0 10*3/uL (ref 0.0–0.1)
Basophils Relative: 0 %
Eosinophils Absolute: 0.2 10*3/uL (ref 0.0–0.5)
Eosinophils Relative: 1 %
HCT: 42 % (ref 36.0–46.0)
Hemoglobin: 13.1 g/dL (ref 12.0–15.0)
Immature Granulocytes: 1 %
Lymphocytes Relative: 37 %
Lymphs Abs: 5.1 10*3/uL — ABNORMAL HIGH (ref 0.7–4.0)
MCH: 26.7 pg (ref 26.0–34.0)
MCHC: 31.2 g/dL (ref 30.0–36.0)
MCV: 85.7 fL (ref 80.0–100.0)
Monocytes Absolute: 1 10*3/uL (ref 0.1–1.0)
Monocytes Relative: 8 %
Neutro Abs: 7.2 10*3/uL (ref 1.7–7.7)
Neutrophils Relative %: 53 %
Platelets: 71 10*3/uL — ABNORMAL LOW (ref 150–400)
RBC: 4.9 MIL/uL (ref 3.87–5.11)
RDW: 15.2 % (ref 11.5–15.5)
WBC: 13.6 10*3/uL — ABNORMAL HIGH (ref 4.0–10.5)
nRBC: 0 % (ref 0.0–0.2)

## 2023-12-29 NOTE — Telephone Encounter (Signed)
 Spoke with patient confirming upcoming appointment

## 2023-12-29 NOTE — Telephone Encounter (Signed)
 Previous CBC with the patient Unfortunately, she does not tolerate prednisone  taper The patient cannot maintain on chronic prednisone  due to difficulties with weight gain We discussed risk and benefits of Promacta versus rituximab  and she agreed to be treated with rituximab  I will get her scheduled in about 2 weeks and we will try treatment to be on Fridays per patient preference

## 2023-12-29 NOTE — Progress Notes (Signed)
 START ON PATHWAY REGIMEN - Immune Thrombocytopenia (ITP)     A cycle is every 7 days:     Rituximab -xxxx   **Always confirm dose/schedule in your pharmacy ordering system**  Patient Characteristics: Relapsed/Refractory, Rapid Response Not Needed, Second Line, Time to Relapse  ? 6 Months Disease Classification: Relapsed / Refractory Line of Therapy: Second Line Time to Relapse: ? 6 Months Intent of Therapy: Non-Curative / Palliative Intent, Discussed with Patient

## 2023-12-30 ENCOUNTER — Other Ambulatory Visit: Payer: Self-pay

## 2024-01-05 NOTE — Progress Notes (Signed)
Pharmacist Chemotherapy Monitoring - Initial Assessment    Anticipated start date: 01/12/24   The following has been reviewed per standard work regarding the patient's treatment regimen: The patient's diagnosis, treatment plan and drug doses, and organ/hematologic function Lab orders and baseline tests specific to treatment regimen  The treatment plan start date, drug sequencing, and pre-medications Prior authorization status  Patient's documented medication list, including drug-drug interaction screen and prescriptions for anti-emetics and supportive care specific to the treatment regimen The drug concentrations, fluid compatibility, administration routes, and timing of the medications to be used The patient's access for treatment and lifetime cumulative dose history, if applicable  The patient's medication allergies and previous infusion related reactions, if applicable   Changes made to treatment plan:  N/A  Follow up needed:  Pending authorization for treatment    Jerry Caras, PharmD PGY2 Oncology Pharmacy Resident   01/05/2024 10:01 AM

## 2024-01-08 ENCOUNTER — Encounter: Payer: Self-pay | Admitting: Hematology and Oncology

## 2024-01-08 ENCOUNTER — Other Ambulatory Visit: Payer: Self-pay | Admitting: Hematology and Oncology

## 2024-01-08 NOTE — Progress Notes (Signed)
Per PA team, insur pref brand Rituxan.  Tx plan updated. Will f/u tol for switch to rapid Rituxan as per protocol.  Ebony Hail, Pharm.D., CPP 01/08/2024@12 :11 PM

## 2024-01-09 ENCOUNTER — Encounter: Payer: Self-pay | Admitting: Family Medicine

## 2024-01-09 ENCOUNTER — Telehealth: Payer: Self-pay | Admitting: *Deleted

## 2024-01-09 ENCOUNTER — Telehealth: Payer: Self-pay

## 2024-01-09 DIAGNOSIS — E1159 Type 2 diabetes mellitus with other circulatory complications: Secondary | ICD-10-CM

## 2024-01-09 DIAGNOSIS — E1169 Type 2 diabetes mellitus with other specified complication: Secondary | ICD-10-CM

## 2024-01-09 MED ORDER — CYCLOBENZAPRINE HCL 10 MG PO TABS: 10.0000 mg | ORAL_TABLET | Freq: Every evening | ORAL | 0 refills | Status: DC | PRN

## 2024-01-09 NOTE — Telephone Encounter (Signed)
Returned her call. She is asking if she can have a visitor with infusion appt. Told her yes she can have 1 person. She will call the office back if the weather is bad and she needs to move 2/21 appt.

## 2024-01-09 NOTE — Telephone Encounter (Signed)
Patient had CMET at [facility] on 12/01/23 so I didn't order one for her CPE labs.  Please let me know if I need to add that.

## 2024-01-09 NOTE — Telephone Encounter (Signed)
-----   Message from Lovena Neighbours sent at 01/09/2024 10:49 AM EST ----- Regarding: Labs for Friday 2.28.25 Please put physical lab orders in future. Thank you, Denny Peon

## 2024-01-09 NOTE — Telephone Encounter (Signed)
Last office visit 07/25/2023 for DM.  Last refilled 08/23/2020 for #15 with no refills.  Next Appt: CPE 02/21/24.

## 2024-01-09 NOTE — Telephone Encounter (Signed)
Let patient know that I am not able to call in the particular brand.  It all depends on what the pharmacy has.

## 2024-01-10 ENCOUNTER — Other Ambulatory Visit: Payer: Self-pay

## 2024-01-12 ENCOUNTER — Inpatient Hospital Stay: Payer: BC Managed Care – PPO

## 2024-01-12 ENCOUNTER — Ambulatory Visit: Payer: BC Managed Care – PPO | Admitting: Physician Assistant

## 2024-01-12 ENCOUNTER — Inpatient Hospital Stay: Payer: BC Managed Care – PPO | Admitting: Hematology and Oncology

## 2024-01-12 ENCOUNTER — Encounter: Payer: Self-pay | Admitting: Hematology and Oncology

## 2024-01-12 VITALS — BP 142/68 | HR 73 | Temp 97.4°F | Resp 18 | Ht 63.0 in | Wt 198.6 lb

## 2024-01-12 VITALS — BP 141/70 | HR 88 | Resp 18

## 2024-01-12 VITALS — BP 128/70 | HR 87 | Temp 98.7°F | Resp 18

## 2024-01-12 DIAGNOSIS — D693 Immune thrombocytopenic purpura: Secondary | ICD-10-CM

## 2024-01-12 DIAGNOSIS — Z5112 Encounter for antineoplastic immunotherapy: Secondary | ICD-10-CM | POA: Diagnosis not present

## 2024-01-12 DIAGNOSIS — D696 Thrombocytopenia, unspecified: Secondary | ICD-10-CM

## 2024-01-12 DIAGNOSIS — R070 Pain in throat: Secondary | ICD-10-CM

## 2024-01-12 DIAGNOSIS — Z9221 Personal history of antineoplastic chemotherapy: Secondary | ICD-10-CM | POA: Diagnosis not present

## 2024-01-12 DIAGNOSIS — Z8542 Personal history of malignant neoplasm of other parts of uterus: Secondary | ICD-10-CM | POA: Diagnosis not present

## 2024-01-12 DIAGNOSIS — Z90710 Acquired absence of both cervix and uterus: Secondary | ICD-10-CM | POA: Diagnosis not present

## 2024-01-12 DIAGNOSIS — T50905A Adverse effect of unspecified drugs, medicaments and biological substances, initial encounter: Secondary | ICD-10-CM

## 2024-01-12 LAB — CBC WITH DIFFERENTIAL/PLATELET
Abs Immature Granulocytes: 0.05 10*3/uL (ref 0.00–0.07)
Basophils Absolute: 0 10*3/uL (ref 0.0–0.1)
Basophils Relative: 0 %
Eosinophils Absolute: 0.1 10*3/uL (ref 0.0–0.5)
Eosinophils Relative: 1 %
HCT: 40.5 % (ref 36.0–46.0)
Hemoglobin: 12.9 g/dL (ref 12.0–15.0)
Immature Granulocytes: 1 %
Lymphocytes Relative: 42 %
Lymphs Abs: 4.5 10*3/uL — ABNORMAL HIGH (ref 0.7–4.0)
MCH: 27.2 pg (ref 26.0–34.0)
MCHC: 31.9 g/dL (ref 30.0–36.0)
MCV: 85.3 fL (ref 80.0–100.0)
Monocytes Absolute: 0.7 10*3/uL (ref 0.1–1.0)
Monocytes Relative: 7 %
Neutro Abs: 5.4 10*3/uL (ref 1.7–7.7)
Neutrophils Relative %: 49 %
Platelets: 77 10*3/uL — ABNORMAL LOW (ref 150–400)
RBC: 4.75 MIL/uL (ref 3.87–5.11)
RDW: 15.4 % (ref 11.5–15.5)
WBC: 10.8 10*3/uL — ABNORMAL HIGH (ref 4.0–10.5)
nRBC: 0 % (ref 0.0–0.2)

## 2024-01-12 MED ORDER — PREDNISONE 20 MG PO TABS: ORAL_TABLET | ORAL | Status: DC

## 2024-01-12 MED ORDER — ACETAMINOPHEN 325 MG PO TABS
650.0000 mg | ORAL_TABLET | Freq: Once | ORAL | Status: AC
  Administered 2024-01-12: 650 mg via ORAL
  Filled 2024-01-12: qty 2

## 2024-01-12 MED ORDER — SODIUM CHLORIDE 0.9 % IV SOLN
375.0000 mg/m2 | Freq: Once | INTRAVENOUS | Status: AC
  Administered 2024-01-12: 800 mg via INTRAVENOUS
  Filled 2024-01-12: qty 50

## 2024-01-12 MED ORDER — DIPHENHYDRAMINE HCL 25 MG PO CAPS
50.0000 mg | ORAL_CAPSULE | Freq: Once | ORAL | Status: AC
  Administered 2024-01-12: 50 mg via ORAL
  Filled 2024-01-12: qty 2

## 2024-01-12 MED ORDER — FAMOTIDINE IN NACL 20-0.9 MG/50ML-% IV SOLN
20.0000 mg | Freq: Once | INTRAVENOUS | Status: AC | PRN
  Administered 2024-01-12: 20 mg via INTRAVENOUS

## 2024-01-12 MED ORDER — SODIUM CHLORIDE 0.9 % IV SOLN: INTRAVENOUS | Status: DC

## 2024-01-12 MED ORDER — METHYLPREDNISOLONE SODIUM SUCC 125 MG IJ SOLR
125.0000 mg | Freq: Once | INTRAMUSCULAR | Status: AC | PRN
  Administered 2024-01-12: 125 mg via INTRAVENOUS

## 2024-01-12 NOTE — Progress Notes (Signed)
Hypersensitivity Reaction note  Date of event: 01/12/24 Time of event: 1408 Generic name of drug involved: Rituxan Name of provider notified of the hypersensitivity reaction: Jae Dire, [place] Was agent that likely caused hypersensitivity reaction added to Allergies List within EMR? No, see notes Chain of events including reaction signs/symptoms, treatment administered, and outcome (e.g., drug resumed; drug discontinued; sent to Emergency Department; etc.) 1408 - Patient stated her throat had started to feel dry/scratchy. Rituxan stopped. IVF started. Jae Dire, [place] notified. 1412 - Pepcid given IV 1415 - Solu - medrol given 1427 - per patient, symptoms resolved. 1431 - Rituxan resumed at half rate ( 41 ml/hr) per Dr Bertis Ruddy  See flowsheet for vital signs and MAR for medications given.  Gailen Shelter, RN 01/12/2024 2:50 PM

## 2024-01-12 NOTE — Assessment & Plan Note (Signed)
We discussed risk and benefits of rituximab again She will return here weekly for the next 3 other treatment I recommend prednisone taper to 10 mg daily

## 2024-01-12 NOTE — Progress Notes (Signed)
    DATE:  01/12/24                                        X CHEMO/IMMUNOTHERAPY REACTION             MD: Bertis Ruddy   AGENT/BLOOD PRODUCT RECEIVING TODAY:              Rituxan   AGENT/BLOOD PRODUCT RECEIVING IMMEDIATELY PRIOR TO REACTION:          Rituxan    Vitals:   01/12/24 1411 01/12/24 1417  BP: (!) 171/71 (!) 141/70  Pulse: 93 88  Resp: 18 18  TempSrc: Oral   SpO2: 100% 100%      REACTION(S):           sore throat   PREMEDS:   Benadryl 50 mg PO, Tylenol 650 mg PO   INTERVENTION: Pepcid 20 mg IV, solu-medrol 125 mg IV   Review of Systems  Review of Systems  HENT:  Positive for sore throat.   All other systems reviewed and are negative.    Physical Exam  Physical Exam Vitals and nursing note reviewed.  Constitutional:      Appearance: She is not ill-appearing or toxic-appearing.  HENT:     Head: Normocephalic.     Mouth/Throat:     Pharynx: Oropharynx is clear.  Eyes:     Conjunctiva/sclera: Conjunctivae normal.  Cardiovascular:     Rate and Rhythm: Normal rate and regular rhythm.     Pulses: Normal pulses.     Heart sounds: Normal heart sounds.  Pulmonary:     Effort: Pulmonary effort is normal.     Breath sounds: Normal breath sounds.  Abdominal:     General: There is no distension.  Musculoskeletal:     Cervical back: Normal range of motion.  Skin:    General: Skin is warm and dry.  Neurological:     Mental Status: She is alert.     OUTCOME:                  Patient became symptomatic during 4th bump of first time rituxan.Emergency medications were administered as documented above. Patient returned to baseline. Oncologist notified and agrees to resume treatment. Patient tolerated remainder of treatment. Pharmacy will add pre medications for next treatment.

## 2024-01-12 NOTE — Progress Notes (Signed)
[facility] OFFICE PROGRESS NOTE  Patient Care Team: Excell Seltzer, MD as PCP - General (Family Medicine) Jodelle Red, MD as PCP - Cardiology (Cardiology)  ASSESSMENT & PLAN:  Acute ITP [facility]) We discussed risk and benefits of rituximab again She will return here weekly for the next 3 other treatment I recommend prednisone taper to 10 mg daily  No orders of the defined types were placed in this encounter.   All questions were answered. The patient knows to call the clinic with any problems, questions or concerns. The total time spent in the appointment was 20 minutes encounter with patients including review of chart and various tests results, discussions about plan of care and coordination of care plan   Artis Delay, MD 01/12/2024 11:17 AM  INTERVAL HISTORY: Please see below for problem oriented charting. she returns for treatment follow-up She denies bleeding We discussed side effects of treatment and future follow-up  REVIEW OF SYSTEMS:   Constitutional: Denies fevers, chills or abnormal weight loss Eyes: Denies blurriness of vision Ears, nose, mouth, throat, and face: Denies mucositis or sore throat Respiratory: Denies cough, dyspnea or wheezes Cardiovascular: Denies palpitation, chest discomfort or lower extremity swelling Gastrointestinal:  Denies nausea, heartburn or change in bowel habits Skin: Denies abnormal skin rashes Lymphatics: Denies new lymphadenopathy or easy bruising Neurological:Denies numbness, tingling or new weaknesses Behavioral/Psych: Mood is stable, no new changes  All other systems were reviewed with the patient and are negative.  I have reviewed the past medical history, past surgical history, social history and family history with the patient and they are unchanged from previous note.  ALLERGIES:  is allergic to latex, sulfa antibiotics, and elemental sulfur.  MEDICATIONS:  Current Outpatient Medications  Medication Sig  Dispense Refill   albuterol (VENTOLIN HFA) 108 (90 Base) MCG/ACT inhaler Inhale 2 puffs into the lungs every 6 (six) hours as needed for wheezing or shortness of breath. 8 g 2   Coenzyme Q10 (COQ10 PO) Take 2 each by mouth daily.     cyclobenzaprine (FLEXERIL) 10 MG tablet Take 1 tablet (10 mg total) by mouth at bedtime as needed for muscle spasms. 15 tablet 0   diclofenac Sodium (VOLTAREN) 1 % GEL Apply topically.     famotidine (PEPCID) 20 MG tablet TAKE 1 TABLET BY MOUTH TWICE DAILY FOR HEART 180 tablet 3   losartan (COZAAR) 50 MG tablet Take 1 tablet (50 mg total) by mouth daily. 90 tablet 3   Multiple Vitamin (MULTIVITAMIN ADULT PO) Take by mouth.     predniSONE (DELTASONE) 20 MG tablet 10 mg daily     rosuvastatin (CRESTOR) 5 MG tablet Take 1 tablet (5 mg total) by mouth daily. Please keep your upcoming appointment for refills. 90 tablet 0   tirzepatide (MOUNJARO) 7.5 MG/0.5ML Pen Inject 7.5 mg into the skin once a week. 6 mL 3   triamcinolone cream (KENALOG) 0.1 % Apply 1 Application topically 2 (two) times daily. 30 g 0   VITAMIN D PO Take 5,000 Units by mouth daily.     No current facility-administered medications for this visit.    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 She saw her PCP on 4/13 for bleeding and an ultrasound was obtained showing a thickened endometrial lining and 3-4cm mass within the endocervical canal. She  was referred to [facility], started on Megace and ultimately underwent a TRH/BSO on 5/18 for abnormal uterine bleeding with resulting anemia, large prolapsing fibroid.    03/06/2020 Imaging   US pelvis Markedly thickened heterogeneous, and hypervascular endometrial complex up to 38 mm thick highly concerning for endometrial neoplasm.   Additional more focal 3.0 x 2.8 x 5.3 cm  diameter mass within endocervical canal, may represent extension of above endometrial pathology versus separate endocervical tumor.   Tissue diagnosis recommended.   Nonvisualization of ovaries.   04/07/2020 Pathology Results   A. UTERUS, CERVIX AND BILATERAL FALLOPIAN TUBES AND OVARIES, HYSTERECTOMY AND BILATERAL SALPINGO-OOPHORECTOMY: - Mullerian adenosarcoma of endometrium. - Tumor limited to the uterus. - Tumor invades to less than half of the myometrium. - No involvement of adnexa. - Margins of resection are not involved. - See oncology table and comment. ONCOLOGY TABLE: UTERUS, SARCOMA: Procedure: Total hysterectomy and bilateral salpingo-oophorectomy Specimen Integrity: The uterus, cervix and bilateral fallopian tubes and ovaries were received intact. The nodule clinically identified as vaginally prolapsed uterine fibroid was received separate and disrupted. Tumor Size: Greatest dimension: 7.5 cm Histologic Type: Mullerian adenosarcoma with overgrowth of the epithelial and stromal components Histologic Grade: The epithelial component has proliferated to form endometrioid adenocarcinoma which ranges from low grade to high grade. The stromal component focally shows sarcomatous overgrowth. Myometrial Invasion: Present Depth of invasion: 4 mm Myometrial thickness: 30 mm Other Tissue/Organ Involvement: Not identified Margins: Uninvolved by sarcoma Lymphovascular Invasion: Not identified Regional Lymph Nodes: No lymph nodes submitted or found Pathologic Stage Classification (pTNM, AJCC 8th Edition): pT1b, pNX Additional Pathologic Findings: Adenomyosis. Leiomyomata.   04/07/2020 Surgery   PRE-OPERATIVE DIAGNOSIS:  Large prolapsed fibroid vaginally, menometrorrhagia with anemia   POST-OPERATIVE DIAGNOSIS:  Large prolapsed fibroid vaginally, menometrorrhagia with anemia.  Omental adhesions with anterior abdominal wall.   PROCEDURE:  Procedure(s): XI ROBOTIC TOTAL LAPAROSCOPIC  HYSTERECTOMY, BILATERAL SALPINGO OOPHORECTOMY/LYSIS OF ADHESIONS/VAGINAL MYOMECTOMY    FINDINGS: Large necrotic vaginally prolapsed uterine myoma.  Uterus with small subserosal/intramural myoma.  Bilateral tubes post tubal ligation.  Bilateral normal ovaries.  Omental adhesions with anterior abdominal wall.   05/01/2020 Imaging   Ct scan of chest, abdomen and pelvis 1. Multiple ill-defined low density liver masses, suspicious for metastases. These could be further characterized with pre and postcontrast magnetic resonance imaging of the liver. 2. Moderate diffuse vaginal wall thickening, possibly representing edema associated with the patient's recent surgery. 3. Small hiatal hernia. 4. Minimal coronary artery atheromatous calcifications. 5. Cholelithiasis. 6. Tiny rounded area of low density in the posterior spleen, too small to characterize.   05/11/2020 Cancer Staging   Staging form: Corpus Uteri - Adenosarcoma, AJCC 8th Edition - Pathologic: Stage IB (pT1b, pN0, cM0) - Signed by Artis Delay, MD on 05/15/2020   05/14/2020 Imaging   MRI liver 1. The 2 dominant right hepatic lobe lesions have MR imaging features most consistent with benign cavernous hemangioma. The remaining tiny liver lesions are too small to characterize. Statistically, while these are likely benign, follow-up MRI in 3 months recommended to ensure stability. 2. No other findings to suggest metastatic disease.   05/22/2020 Procedure   Placement of a subcutaneous port device. Catheter tip at the superior cavoatrial junction   05/29/2020 - 09/11/2020 Chemotherapy   The patient had carboplatin and taxol for chemotherapy treatment.     07/22/2020 Imaging   1. Status post hysterectomy and bilateral oophorectomy. Irregular soft tissue density in the region of the vaginal cuff is relatively similar to on the  prior exam. Although this could represent developing scar or chronic hematoma, residual disease cannot be excluded. This could  either be re-evaluated at follow-up or if a more aggressive approach is desired, characterized with PET. 2. No other evidence of metastatic disease in the abdomen or pelvis. 3. Hepatic hemangiomas, as before. Smaller liver lesions are unchanged, favoring a benign etiology. 4. Cholelithiasis. 5. Tiny hiatal hernia. 6. Aortic Atherosclerosis (ICD10-I70.0).   10/12/2020 Imaging   1. Stable appearance of the vaginal cuff status post hysterectomy. Soft tissue fullness appears unchanged, likely postsurgical in etiology. Correlate with physical examination and tumor markers. 2. No evidence of metastatic disease. 3. Stable hepatic hemangiomas. 4. Cholelithiasis without evidence of cholecystitis or biliary dilatation. 5. Aortic Atherosclerosis (ICD10-I70.0).   04/12/2021 Imaging   1. Status post hysterectomy. Unchanged appearance of somewhat nodular soft tissue at the left and right aspects of the vaginal cuff, again favored to represent postoperative change without specific findings or evidence of progression to suggest local malignant recurrence. 2. No noncontrast evidence of metastatic disease in the abdomen or pelvis. 3. Cholelithiasis.   Aortic Atherosclerosis (ICD10-I70.0).   04/30/2021 Procedure   Removal of implanted Port-A-Cath utilizing sharp and blunt dissection. The procedure was uncomplicated.   10/31/2022 Imaging   1. Stable CT of the abdomen and pelvis. No specific findings identified to suggest residual or recurrent tumor or metastatic disease. 2. Stable liver hemangiomas. 3. Gallstone.   12/09/2023 Imaging   CT CHEST ABDOMEN PELVIS W CONTRAST Result Date: 12/10/2023 CLINICAL DATA:  Uterine adenosarcoma restaging, ITP * Tracking Code: BO * EXAM: CT CHEST, ABDOMEN, AND PELVIS WITH CONTRAST TECHNIQUE: Multidetector CT imaging of the chest, abdomen and pelvis was performed following the standard protocol during bolus administration of intravenous contrast. RADIATION DOSE REDUCTION:  This exam was performed according to the departmental dose-optimization program which includes automated exposure control, adjustment of the mA and/or kV according to patient size and/or use of iterative reconstruction technique. CONTRAST:  90mL OMNIPAQUE IOHEXOL 300 MG/ML  SOLN COMPARISON:  CT abdomen pelvis, 10/27/2022, CT chest, 05/01/2020 MR abdomen, 05/14/2020 FINDINGS: CT CHEST FINDINGS Cardiovascular: No significant vascular findings. Normal heart size. No pericardial effusion. Mediastinum/Nodes: No enlarged mediastinal, hilar, or axillary lymph nodes. Unchanged thymic remnant or rebound in the anterior mediastinum. Thyroid gland, trachea, and esophagus demonstrate no significant findings. Lungs/Pleura: Lungs are clear. No pleural effusion or pneumothorax. Musculoskeletal: No chest wall abnormality. No acute osseous findings. CT ABDOMEN PELVIS FINDINGS Hepatobiliary: Unchanged hepatic hemangiomas of the liver dome, previously characterized by MR, benign, requiring no further follow-up or characterization (series 2, image 43). Status post cholecystectomy. No biliary dilatation. Pancreas: Unremarkable. No pancreatic ductal dilatation or surrounding inflammatory changes. Spleen: Normal in size without significant abnormality. Adrenals/Urinary Tract: Adrenal glands are unremarkable. Kidneys are normal, without renal calculi, solid lesion, or hydronephrosis. Bladder is unremarkable. Stomach/Bowel: Stomach is within normal limits. Appendix appears normal. No evidence of bowel wall thickening, distention, or inflammatory changes. Vascular/Lymphatic: Scattered aortic atherosclerosis. No enlarged abdominal or pelvic lymph nodes. Reproductive: Unchanged postoperative appearance of the pelvis status post hysterectomy (series 2, image 104). Other: No abdominal wall hernia or abnormality. No ascites. Musculoskeletal: No acute osseous findings. IMPRESSION: 1. Unchanged postoperative appearance of the pelvis status post  hysterectomy. 2. No evidence of lymphadenopathy or metastatic disease in the chest, abdomen, or pelvis. 3. Status post cholecystectomy. Aortic Atherosclerosis (ICD10-I70.0). Electronically Signed   By: Jearld Lesch M.D.   On: 12/10/2023 08:04        PHYSICAL EXAMINATION: ECOG PERFORMANCE  STATUS: 0 - Asymptomatic  Vitals:   01/12/24 1038  BP: (!) 142/68  Pulse: 73  Resp: 18  Temp: (!) 97.4 F (36.3 C)  SpO2: 100%   Filed Weights   01/12/24 1038  Weight: 198 lb 9.6 oz (90.1 kg)    GENERAL:alert, no distress and comfortable  LABORATORY DATA:  I have reviewed the data as listed    Component Value Date/Time   NA 139 12/01/2023 0921   NA 142 01/28/2021 0922   NA 142 04/07/2017 0810   K 4.0 12/01/2023 0921   K 3.8 04/07/2017 0810   CL 104 12/01/2023 0921   CO2 29 12/01/2023 0921   CO2 28 04/07/2017 0810   GLUCOSE 91 12/01/2023 0921   GLUCOSE 100 04/07/2017 0810   BUN 17 12/01/2023 0921   BUN 17 01/28/2021 0922   BUN 16.8 04/07/2017 0810   CREATININE 0.77 12/01/2023 0921   CREATININE 0.9 04/07/2017 0810   CALCIUM 9.8 12/01/2023 0921   CALCIUM 9.9 04/07/2017 0810   PROT 8.2 (H) 12/01/2023 0921   PROT 7.6 01/28/2021 0922   PROT 8.0 04/07/2017 0810   ALBUMIN 4.8 12/01/2023 0921   ALBUMIN 4.8 01/28/2021 0922   ALBUMIN 4.4 04/07/2017 0810   AST 14 (L) 12/01/2023 0921   AST 18 04/07/2017 0810   ALT 14 12/01/2023 0921   ALT 23 04/07/2017 0810   ALKPHOS 86 12/01/2023 0921   ALKPHOS 79 04/07/2017 0810   BILITOT 1.2 12/01/2023 0921   BILITOT 0.70 04/07/2017 0810   GFRNONAA >60 12/01/2023 0921   GFRAA >60 08/21/2020 0805    No results found for: "SPEP", "UPEP"  Lab Results  Component Value Date   WBC 10.8 (H) 01/12/2024   NEUTROABS 5.4 01/12/2024   HGB 12.9 01/12/2024   HCT 40.5 01/12/2024   MCV 85.3 01/12/2024   PLT 77 (L) 01/12/2024      Chemistry      Component Value Date/Time   NA 139 12/01/2023 0921   NA 142 01/28/2021 0922   NA 142 04/07/2017 0810    K 4.0 12/01/2023 0921   K 3.8 04/07/2017 0810   CL 104 12/01/2023 0921   CO2 29 12/01/2023 0921   CO2 28 04/07/2017 0810   BUN 17 12/01/2023 0921   BUN 17 01/28/2021 0922   BUN 16.8 04/07/2017 0810   CREATININE 0.77 12/01/2023 0921   CREATININE 0.9 04/07/2017 0810      Component Value Date/Time   CALCIUM 9.8 12/01/2023 0921   CALCIUM 9.9 04/07/2017 0810   ALKPHOS 86 12/01/2023 0921   ALKPHOS 79 04/07/2017 0810   AST 14 (L) 12/01/2023 0921   AST 18 04/07/2017 0810   ALT 14 12/01/2023 0921   ALT 23 04/07/2017 0810   BILITOT 1.2 12/01/2023 0921   BILITOT 0.70 04/07/2017 0810

## 2024-01-12 NOTE — Patient Instructions (Signed)
 [facility]  Discharge Instructions: Thank you for choosing [facility] to provide your oncology and hematology care.   If you have a lab appointment with the Cancer Center, please go directly to the Cancer Center and check in at the registration area.   Wear comfortable clothing and clothing appropriate for easy access to any Portacath or PICC line.   We strive to give you quality time with your provider. You may need to reschedule your appointment if you arrive late (15 or more minutes).  Arriving late affects you and other patients whose appointments are after yours.  Also, if you miss three or more appointments without notifying the office, you may be dismissed from the clinic at the provider's discretion.      For prescription refill requests, have your pharmacy contact our office and allow 72 hours for refills to be completed.    Today you received the following chemotherapy and/or immunotherapy agents Rituxan    To help prevent nausea and vomiting after your treatment, we encourage you to take your nausea medication as directed.  BELOW ARE SYMPTOMS THAT SHOULD BE REPORTED IMMEDIATELY: *FEVER GREATER THAN 100.4 F (38 C) OR HIGHER *CHILLS OR SWEATING *NAUSEA AND VOMITING THAT IS NOT CONTROLLED WITH YOUR NAUSEA MEDICATION *UNUSUAL SHORTNESS OF BREATH *UNUSUAL BRUISING OR BLEEDING *URINARY PROBLEMS (pain or burning when urinating, or frequent urination) *BOWEL PROBLEMS (unusual diarrhea, constipation, pain near the anus) TENDERNESS IN MOUTH AND THROAT WITH OR WITHOUT PRESENCE OF ULCERS (sore throat, sores in mouth, or a toothache) UNUSUAL RASH, SWELLING OR PAIN  UNUSUAL VAGINAL DISCHARGE OR ITCHING   Items with * indicate a potential emergency and should be followed up as soon as possible or go to the Emergency Department if any problems should occur.  Please show the CHEMOTHERAPY ALERT CARD or IMMUNOTHERAPY ALERT  CARD at check-in to the Emergency Department and triage nurse.  Should you have questions after your visit or need to cancel or reschedule your appointment, please contact [facility]  Dept: [phone]  and follow the prompts.  Office hours are 8:00 a.m. to 4:30 p.m. Monday - Friday. Please note that voicemails left after 4:00 p.m. may not be returned until the following business day.  We are closed weekends and major holidays. You have access to a nurse at all times for urgent questions. Please call the main number to the clinic Dept: [phone] and follow the prompts.   For any non-urgent questions, you may also contact your provider using MyChart. We now offer e-Visits for anyone 29 and older to request care online for non-urgent symptoms. For details visit [email or web address].   Also download the MyChart app! Go to the app store, search "MyChart", open the app, select [place], and log in with your MyChart username and password.

## 2024-01-15 ENCOUNTER — Telehealth: Payer: Self-pay

## 2024-01-15 NOTE — Telephone Encounter (Signed)
 Called to see how she is doing today after getting rituxan Friday. She is doing well and at work today. She will call the office back for questions/ concerns.

## 2024-01-19 ENCOUNTER — Inpatient Hospital Stay: Payer: BC Managed Care – PPO

## 2024-01-19 ENCOUNTER — Other Ambulatory Visit: Payer: Self-pay | Admitting: Hematology and Oncology

## 2024-01-19 ENCOUNTER — Other Ambulatory Visit: Payer: BC Managed Care – PPO

## 2024-01-19 VITALS — BP 130/76 | HR 88 | Temp 98.4°F | Resp 16 | Wt 195.5 lb

## 2024-01-19 DIAGNOSIS — Z8542 Personal history of malignant neoplasm of other parts of uterus: Secondary | ICD-10-CM | POA: Diagnosis not present

## 2024-01-19 DIAGNOSIS — Z5112 Encounter for antineoplastic immunotherapy: Secondary | ICD-10-CM | POA: Diagnosis not present

## 2024-01-19 DIAGNOSIS — Z90710 Acquired absence of both cervix and uterus: Secondary | ICD-10-CM | POA: Diagnosis not present

## 2024-01-19 DIAGNOSIS — Z9221 Personal history of antineoplastic chemotherapy: Secondary | ICD-10-CM | POA: Diagnosis not present

## 2024-01-19 DIAGNOSIS — D696 Thrombocytopenia, unspecified: Secondary | ICD-10-CM

## 2024-01-19 DIAGNOSIS — D693 Immune thrombocytopenic purpura: Secondary | ICD-10-CM

## 2024-01-19 LAB — CBC WITH DIFFERENTIAL/PLATELET
Abs Immature Granulocytes: 0.05 K/uL (ref 0.00–0.07)
Basophils Absolute: 0 K/uL (ref 0.0–0.1)
Basophils Relative: 0 %
Eosinophils Absolute: 0.1 K/uL (ref 0.0–0.5)
Eosinophils Relative: 1 %
HCT: 39.6 % (ref 36.0–46.0)
Hemoglobin: 12.6 g/dL (ref 12.0–15.0)
Immature Granulocytes: 1 %
Lymphocytes Relative: 35 %
Lymphs Abs: 3.3 K/uL (ref 0.7–4.0)
MCH: 27.4 pg (ref 26.0–34.0)
MCHC: 31.8 g/dL (ref 30.0–36.0)
MCV: 86.1 fL (ref 80.0–100.0)
Monocytes Absolute: 0.7 K/uL (ref 0.1–1.0)
Monocytes Relative: 8 %
Neutro Abs: 5.3 K/uL (ref 1.7–7.7)
Neutrophils Relative %: 55 %
Platelets: 88 K/uL — ABNORMAL LOW (ref 150–400)
RBC: 4.6 MIL/uL (ref 3.87–5.11)
RDW: 15.5 % (ref 11.5–15.5)
WBC: 9.4 K/uL (ref 4.0–10.5)
nRBC: 0 % (ref 0.0–0.2)

## 2024-01-19 MED ORDER — SODIUM CHLORIDE 0.9 % IV SOLN: INTRAVENOUS | Status: DC

## 2024-01-19 MED ORDER — SODIUM CHLORIDE 0.9 % IV SOLN
375.0000 mg/m2 | Freq: Once | INTRAVENOUS | Status: AC
  Administered 2024-01-19: 800 mg via INTRAVENOUS
  Filled 2024-01-19: qty 50

## 2024-01-19 MED ORDER — ACETAMINOPHEN 325 MG PO TABS
650.0000 mg | ORAL_TABLET | Freq: Once | ORAL | Status: AC
  Administered 2024-01-19: 650 mg via ORAL
  Filled 2024-01-19: qty 2

## 2024-01-19 MED ORDER — FAMOTIDINE IN NACL 20-0.9 MG/50ML-% IV SOLN
20.0000 mg | Freq: Once | INTRAVENOUS | Status: AC
  Administered 2024-01-19: 20 mg via INTRAVENOUS
  Filled 2024-01-19: qty 50

## 2024-01-19 MED ORDER — DIPHENHYDRAMINE HCL 25 MG PO CAPS
50.0000 mg | ORAL_CAPSULE | Freq: Once | ORAL | Status: AC
  Administered 2024-01-19: 50 mg via ORAL
  Filled 2024-01-19: qty 2

## 2024-01-19 MED ORDER — PREDNISONE 5 MG PO TABS: 5.0000 mg | ORAL_TABLET | Freq: Every day | ORAL | 1 refills | Status: DC

## 2024-01-19 MED ORDER — METHYLPREDNISOLONE SODIUM SUCC 125 MG IJ SOLR
125.0000 mg | Freq: Once | INTRAMUSCULAR | Status: AC
  Administered 2024-01-19: 125 mg via INTRAVENOUS
  Filled 2024-01-19: qty 2

## 2024-01-19 NOTE — Patient Instructions (Signed)
 [facility]  Discharge Instructions: Thank you for choosing [facility] to provide your oncology and hematology care.   If you have a lab appointment with the Cancer Center, please go directly to the Cancer Center and check in at the registration area.   Wear comfortable clothing and clothing appropriate for easy access to any Portacath or PICC line.   We strive to give you quality time with your provider. You may need to reschedule your appointment if you arrive late (15 or more minutes).  Arriving late affects you and other patients whose appointments are after yours.  Also, if you miss three or more appointments without notifying the office, you may be dismissed from the clinic at the provider's discretion.      For prescription refill requests, have your pharmacy contact our office and allow 72 hours for refills to be completed.    Today you received the following chemotherapy and/or immunotherapy agents Rituxan    To help prevent nausea and vomiting after your treatment, we encourage you to take your nausea medication as directed.  BELOW ARE SYMPTOMS THAT SHOULD BE REPORTED IMMEDIATELY: *FEVER GREATER THAN 100.4 F (38 C) OR HIGHER *CHILLS OR SWEATING *NAUSEA AND VOMITING THAT IS NOT CONTROLLED WITH YOUR NAUSEA MEDICATION *UNUSUAL SHORTNESS OF BREATH *UNUSUAL BRUISING OR BLEEDING *URINARY PROBLEMS (pain or burning when urinating, or frequent urination) *BOWEL PROBLEMS (unusual diarrhea, constipation, pain near the anus) TENDERNESS IN MOUTH AND THROAT WITH OR WITHOUT PRESENCE OF ULCERS (sore throat, sores in mouth, or a toothache) UNUSUAL RASH, SWELLING OR PAIN  UNUSUAL VAGINAL DISCHARGE OR ITCHING   Items with * indicate a potential emergency and should be followed up as soon as possible or go to the Emergency Department if any problems should occur.  Please show the CHEMOTHERAPY ALERT CARD or IMMUNOTHERAPY ALERT  CARD at check-in to the Emergency Department and triage nurse.  Should you have questions after your visit or need to cancel or reschedule your appointment, please contact [facility]  Dept: [phone]  and follow the prompts.  Office hours are 8:00 a.m. to 4:30 p.m. Monday - Friday. Please note that voicemails left after 4:00 p.m. may not be returned until the following business day.  We are closed weekends and major holidays. You have access to a nurse at all times for urgent questions. Please call the main number to the clinic Dept: [phone] and follow the prompts.   For any non-urgent questions, you may also contact your provider using MyChart. We now offer e-Visits for anyone 29 and older to request care online for non-urgent symptoms. For details visit [email or web address].   Also download the MyChart app! Go to the app store, search "MyChart", open the app, select [place], and log in with your MyChart username and password.

## 2024-01-26 ENCOUNTER — Ambulatory Visit: Payer: BC Managed Care – PPO

## 2024-01-26 ENCOUNTER — Encounter: Payer: Self-pay | Admitting: Hematology and Oncology

## 2024-01-26 ENCOUNTER — Other Ambulatory Visit: Payer: Self-pay | Admitting: Hematology and Oncology

## 2024-01-26 ENCOUNTER — Inpatient Hospital Stay: Payer: BC Managed Care – PPO

## 2024-01-26 ENCOUNTER — Other Ambulatory Visit: Payer: BC Managed Care – PPO

## 2024-01-26 ENCOUNTER — Encounter: Payer: BC Managed Care – PPO | Admitting: Family Medicine

## 2024-01-26 ENCOUNTER — Other Ambulatory Visit: Payer: Self-pay | Admitting: *Deleted

## 2024-01-26 ENCOUNTER — Inpatient Hospital Stay: Payer: BC Managed Care – PPO | Attending: Hematology and Oncology

## 2024-01-26 VITALS — BP 150/72 | HR 86 | Temp 97.8°F | Resp 16 | Wt 195.8 lb

## 2024-01-26 DIAGNOSIS — Z90722 Acquired absence of ovaries, bilateral: Secondary | ICD-10-CM | POA: Diagnosis not present

## 2024-01-26 DIAGNOSIS — D696 Thrombocytopenia, unspecified: Secondary | ICD-10-CM

## 2024-01-26 DIAGNOSIS — E878 Other disorders of electrolyte and fluid balance, not elsewhere classified: Secondary | ICD-10-CM

## 2024-01-26 DIAGNOSIS — Z5112 Encounter for antineoplastic immunotherapy: Secondary | ICD-10-CM | POA: Diagnosis not present

## 2024-01-26 DIAGNOSIS — D693 Immune thrombocytopenic purpura: Secondary | ICD-10-CM | POA: Diagnosis not present

## 2024-01-26 DIAGNOSIS — Z7952 Long term (current) use of systemic steroids: Secondary | ICD-10-CM | POA: Insufficient documentation

## 2024-01-26 DIAGNOSIS — Z8542 Personal history of malignant neoplasm of other parts of uterus: Secondary | ICD-10-CM | POA: Insufficient documentation

## 2024-01-26 DIAGNOSIS — R0602 Shortness of breath: Secondary | ICD-10-CM | POA: Insufficient documentation

## 2024-01-26 DIAGNOSIS — Z9221 Personal history of antineoplastic chemotherapy: Secondary | ICD-10-CM | POA: Insufficient documentation

## 2024-01-26 DIAGNOSIS — C549 Malignant neoplasm of corpus uteri, unspecified: Secondary | ICD-10-CM

## 2024-01-26 DIAGNOSIS — Z90710 Acquired absence of both cervix and uterus: Secondary | ICD-10-CM | POA: Insufficient documentation

## 2024-01-26 DIAGNOSIS — Z9079 Acquired absence of other genital organ(s): Secondary | ICD-10-CM | POA: Insufficient documentation

## 2024-01-26 LAB — CBC WITH DIFFERENTIAL/PLATELET
Abs Immature Granulocytes: 0.08 10*3/uL — ABNORMAL HIGH (ref 0.00–0.07)
Basophils Absolute: 0 10*3/uL (ref 0.0–0.1)
Basophils Relative: 0 %
Eosinophils Absolute: 0.1 10*3/uL (ref 0.0–0.5)
Eosinophils Relative: 1 %
HCT: 41.9 % (ref 36.0–46.0)
Hemoglobin: 13.3 g/dL (ref 12.0–15.0)
Immature Granulocytes: 1 %
Lymphocytes Relative: 30 %
Lymphs Abs: 2.9 10*3/uL (ref 0.7–4.0)
MCH: 27.4 pg (ref 26.0–34.0)
MCHC: 31.7 g/dL (ref 30.0–36.0)
MCV: 86.4 fL (ref 80.0–100.0)
Monocytes Absolute: 0.6 10*3/uL (ref 0.1–1.0)
Monocytes Relative: 7 %
Neutro Abs: 5.9 10*3/uL (ref 1.7–7.7)
Neutrophils Relative %: 61 %
Platelets: 53 10*3/uL — ABNORMAL LOW (ref 150–400)
RBC: 4.85 MIL/uL (ref 3.87–5.11)
RDW: 15.5 % (ref 11.5–15.5)
WBC: 9.7 10*3/uL (ref 4.0–10.5)
nRBC: 0 % (ref 0.0–0.2)

## 2024-01-26 LAB — CMP (CANCER CENTER ONLY)
ALT: 36 U/L (ref 0–44)
AST: 26 U/L (ref 15–41)
Albumin: 4.7 g/dL (ref 3.5–5.0)
Alkaline Phosphatase: 90 U/L (ref 38–126)
Anion gap: 8 (ref 5–15)
BUN: 15 mg/dL (ref 8–23)
CO2: 31 mmol/L (ref 22–32)
Calcium: 9.6 mg/dL (ref 8.9–10.3)
Chloride: 103 mmol/L (ref 98–111)
Creatinine: 0.92 mg/dL (ref 0.44–1.00)
GFR, Estimated: >60 mL/min
Glucose, Bld: 110 mg/dL — ABNORMAL HIGH (ref 70–99)
Potassium: 4.3 mmol/L (ref 3.5–5.1)
Sodium: 142 mmol/L (ref 135–145)
Total Bilirubin: 0.6 mg/dL (ref 0.0–1.2)
Total Protein: 7.8 g/dL (ref 6.5–8.1)

## 2024-01-26 LAB — MAGNESIUM: Magnesium: 2.4 mg/dL (ref 1.7–2.4)

## 2024-01-26 MED ORDER — MONTELUKAST SODIUM 10 MG PO TABS
10.0000 mg | ORAL_TABLET | Freq: Once | ORAL | Status: AC
  Administered 2024-01-26: 10 mg via ORAL
  Filled 2024-01-26: qty 1

## 2024-01-26 MED ORDER — METHYLPREDNISOLONE SODIUM SUCC 125 MG IJ SOLR
125.0000 mg | Freq: Once | INTRAMUSCULAR | Status: AC
  Administered 2024-01-26: 125 mg via INTRAVENOUS
  Filled 2024-01-26: qty 2

## 2024-01-26 MED ORDER — FAMOTIDINE IN NACL 20-0.9 MG/50ML-% IV SOLN
20.0000 mg | Freq: Once | INTRAVENOUS | Status: AC
  Administered 2024-01-26: 20 mg via INTRAVENOUS
  Filled 2024-01-26: qty 50

## 2024-01-26 MED ORDER — SODIUM CHLORIDE 0.9 % IV SOLN
375.0000 mg/m2 | Freq: Once | INTRAVENOUS | Status: AC
  Administered 2024-01-26: 800 mg via INTRAVENOUS
  Filled 2024-01-26: qty 50

## 2024-01-26 MED ORDER — SODIUM CHLORIDE 0.9 % IV SOLN: INTRAVENOUS | Status: DC

## 2024-01-26 MED ORDER — ACETAMINOPHEN 325 MG PO TABS
650.0000 mg | ORAL_TABLET | Freq: Once | ORAL | Status: AC
  Administered 2024-01-26: 650 mg via ORAL
  Filled 2024-01-26: qty 2

## 2024-01-26 MED ORDER — MONTELUKAST SODIUM 10 MG PO TABS
10.0000 mg | ORAL_TABLET | Freq: Every day | ORAL | Status: DC
Start: 2024-01-26 — End: 2024-01-26

## 2024-01-26 NOTE — Patient Instructions (Signed)
 [facility]  Discharge Instructions: Thank you for choosing [facility] to provide your oncology and hematology care.   If you have a lab appointment with the Cancer Center, please go directly to the Cancer Center and check in at the registration area.   Wear comfortable clothing and clothing appropriate for easy access to any Portacath or PICC line.   We strive to give you quality time with your provider. You may need to reschedule your appointment if you arrive late (15 or more minutes).  Arriving late affects you and other patients whose appointments are after yours.  Also, if you miss three or more appointments without notifying the office, you may be dismissed from the clinic at the provider's discretion.      For prescription refill requests, have your pharmacy contact our office and allow 72 hours for refills to be completed.    Today you received the following chemotherapy and/or immunotherapy agents: rituximab      To help prevent nausea and vomiting after your treatment, we encourage you to take your nausea medication as directed.  BELOW ARE SYMPTOMS THAT SHOULD BE REPORTED IMMEDIATELY: *FEVER GREATER THAN 100.4 F (38 C) OR HIGHER *CHILLS OR SWEATING *NAUSEA AND VOMITING THAT IS NOT CONTROLLED WITH YOUR NAUSEA MEDICATION *UNUSUAL SHORTNESS OF BREATH *UNUSUAL BRUISING OR BLEEDING *URINARY PROBLEMS (pain or burning when urinating, or frequent urination) *BOWEL PROBLEMS (unusual diarrhea, constipation, pain near the anus) TENDERNESS IN MOUTH AND THROAT WITH OR WITHOUT PRESENCE OF ULCERS (sore throat, sores in mouth, or a toothache) UNUSUAL RASH, SWELLING OR PAIN  UNUSUAL VAGINAL DISCHARGE OR ITCHING   Items with * indicate a potential emergency and should be followed up as soon as possible or go to the Emergency Department if any problems should occur.  Please show the CHEMOTHERAPY ALERT CARD or IMMUNOTHERAPY  ALERT CARD at check-in to the Emergency Department and triage nurse.  Should you have questions after your visit or need to cancel or reschedule your appointment, please contact [facility]  Dept: [phone]  and follow the prompts.  Office hours are 8:00 a.m. to 4:30 p.m. Monday - Friday. Please note that voicemails left after 4:00 p.m. may not be returned until the following business day.  We are closed weekends and major holidays. You have access to a nurse at all times for urgent questions. Please call the main number to the clinic Dept: [phone] and follow the prompts.   For any non-urgent questions, you may also contact your provider using MyChart. We now offer e-Visits for anyone 60 and older to request care online for non-urgent symptoms. For details visit [email or web address].   Also download the MyChart app! Go to the app store, search "MyChart", open the app, select , and log in with your MyChart username and password.

## 2024-01-26 NOTE — Progress Notes (Signed)
 Pt expressed c/o worsened cramping in her feet, right lower leg (similar to a charlie horse), and in her hands. This RN made Dr. Bertis Ruddy aware.   Per Dr. Bertis Ruddy obtain CMP to check for electrolyte imbalances and Benadryl to be switched to Singulair as a premedication.  This RN made Pt aware and Pt verbalized understanding and agreeable with plan.   Dr. Bertis Ruddy adjusted Pt's at home prednisone dose from 5 mg daily to 10 mg daily d/t drop in Plt count (Plts 53K today). This RN made Pt aware. Pt verbalized understanding to take 10 mg to Prednisone daily and was agreeable with dose adjustment.

## 2024-02-01 ENCOUNTER — Encounter: Payer: Self-pay | Admitting: Hematology and Oncology

## 2024-02-02 ENCOUNTER — Inpatient Hospital Stay: Payer: BC Managed Care – PPO

## 2024-02-02 ENCOUNTER — Other Ambulatory Visit: Payer: BC Managed Care – PPO

## 2024-02-02 ENCOUNTER — Ambulatory Visit: Payer: BC Managed Care – PPO

## 2024-02-02 VITALS — BP 137/75 | HR 84 | Temp 98.7°F | Resp 18 | Wt 199.8 lb

## 2024-02-02 DIAGNOSIS — Z9221 Personal history of antineoplastic chemotherapy: Secondary | ICD-10-CM | POA: Diagnosis not present

## 2024-02-02 DIAGNOSIS — R0602 Shortness of breath: Secondary | ICD-10-CM | POA: Diagnosis not present

## 2024-02-02 DIAGNOSIS — D693 Immune thrombocytopenic purpura: Secondary | ICD-10-CM | POA: Diagnosis not present

## 2024-02-02 DIAGNOSIS — Z7952 Long term (current) use of systemic steroids: Secondary | ICD-10-CM | POA: Diagnosis not present

## 2024-02-02 DIAGNOSIS — Z9079 Acquired absence of other genital organ(s): Secondary | ICD-10-CM | POA: Diagnosis not present

## 2024-02-02 DIAGNOSIS — D696 Thrombocytopenia, unspecified: Secondary | ICD-10-CM

## 2024-02-02 DIAGNOSIS — Z90710 Acquired absence of both cervix and uterus: Secondary | ICD-10-CM | POA: Diagnosis not present

## 2024-02-02 DIAGNOSIS — Z8542 Personal history of malignant neoplasm of other parts of uterus: Secondary | ICD-10-CM | POA: Diagnosis not present

## 2024-02-02 DIAGNOSIS — Z90722 Acquired absence of ovaries, bilateral: Secondary | ICD-10-CM | POA: Diagnosis not present

## 2024-02-02 DIAGNOSIS — Z5112 Encounter for antineoplastic immunotherapy: Secondary | ICD-10-CM | POA: Diagnosis not present

## 2024-02-02 LAB — CBC WITH DIFFERENTIAL/PLATELET
Abs Immature Granulocytes: 0.06 10*3/uL (ref 0.00–0.07)
Basophils Absolute: 0 10*3/uL (ref 0.0–0.1)
Basophils Relative: 0 %
Eosinophils Absolute: 0.1 10*3/uL (ref 0.0–0.5)
Eosinophils Relative: 2 %
HCT: 41.9 % (ref 36.0–46.0)
Hemoglobin: 13.3 g/dL (ref 12.0–15.0)
Immature Granulocytes: 1 %
Lymphocytes Relative: 36 %
Lymphs Abs: 3.5 10*3/uL (ref 0.7–4.0)
MCH: 27.5 pg (ref 26.0–34.0)
MCHC: 31.7 g/dL (ref 30.0–36.0)
MCV: 86.7 fL (ref 80.0–100.0)
Monocytes Absolute: 0.7 10*3/uL (ref 0.1–1.0)
Monocytes Relative: 7 %
Neutro Abs: 5.2 10*3/uL (ref 1.7–7.7)
Neutrophils Relative %: 54 %
Platelets: 58 10*3/uL — ABNORMAL LOW (ref 150–400)
RBC: 4.83 MIL/uL (ref 3.87–5.11)
RDW: 15.6 % — ABNORMAL HIGH (ref 11.5–15.5)
WBC: 9.6 10*3/uL (ref 4.0–10.5)
nRBC: 0 % (ref 0.0–0.2)

## 2024-02-02 MED ORDER — FAMOTIDINE IN NACL 20-0.9 MG/50ML-% IV SOLN
20.0000 mg | Freq: Once | INTRAVENOUS | Status: AC
  Administered 2024-02-02: 20 mg via INTRAVENOUS
  Filled 2024-02-02: qty 50

## 2024-02-02 MED ORDER — SODIUM CHLORIDE 0.9 % IV SOLN
375.0000 mg/m2 | Freq: Once | INTRAVENOUS | Status: AC
  Administered 2024-02-02: 800 mg via INTRAVENOUS
  Filled 2024-02-02: qty 50

## 2024-02-02 MED ORDER — METHYLPREDNISOLONE SODIUM SUCC 125 MG IJ SOLR
125.0000 mg | Freq: Once | INTRAMUSCULAR | Status: AC
  Administered 2024-02-02: 125 mg via INTRAVENOUS
  Filled 2024-02-02: qty 2

## 2024-02-02 MED ORDER — MONTELUKAST SODIUM 10 MG PO TABS
10.0000 mg | ORAL_TABLET | Freq: Every day | ORAL | Status: DC
  Administered 2024-02-02: 10 mg via ORAL
  Filled 2024-02-02: qty 1

## 2024-02-02 MED ORDER — SODIUM CHLORIDE 0.9 % IV SOLN: INTRAVENOUS | Status: DC

## 2024-02-02 MED ORDER — ACETAMINOPHEN 325 MG PO TABS
650.0000 mg | ORAL_TABLET | Freq: Once | ORAL | Status: AC
Start: 2024-02-02 — End: 2024-02-02
  Administered 2024-02-02: 650 mg via ORAL
  Filled 2024-02-02: qty 2

## 2024-02-02 NOTE — Patient Instructions (Signed)
 [facility] - A DEPT OF [facility]  Discharge Instructions: Thank you for choosing [facility] to provide your oncology and hematology care.   If you have a lab appointment with the Cancer Center, please go directly to the Cancer Center and check in at the registration area.   Wear comfortable clothing and clothing appropriate for easy access to any Portacath or PICC line.   We strive to give you quality time with your provider. You may need to reschedule your appointment if you arrive late (15 or more minutes).  Arriving late affects you and other patients whose appointments are after yours.  Also, if you miss three or more appointments without notifying the office, you may be dismissed from the clinic at the provider's discretion.      For prescription refill requests, have your pharmacy contact our office and allow 72 hours for refills to be completed.    Today you received the following chemotherapy and/or immunotherapy agents Rituximab      To help prevent nausea and vomiting after your treatment, we encourage you to take your nausea medication as directed.  BELOW ARE SYMPTOMS THAT SHOULD BE REPORTED IMMEDIATELY: *FEVER GREATER THAN 100.4 F (38 C) OR HIGHER *CHILLS OR SWEATING *NAUSEA AND VOMITING THAT IS NOT CONTROLLED WITH YOUR NAUSEA MEDICATION *UNUSUAL SHORTNESS OF BREATH *UNUSUAL BRUISING OR BLEEDING *URINARY PROBLEMS (pain or burning when urinating, or frequent urination) *BOWEL PROBLEMS (unusual diarrhea, constipation, pain near the anus) TENDERNESS IN MOUTH AND THROAT WITH OR WITHOUT PRESENCE OF ULCERS (sore throat, sores in mouth, or a toothache) UNUSUAL RASH, SWELLING OR PAIN  UNUSUAL VAGINAL DISCHARGE OR ITCHING   Items with * indicate a potential emergency and should be followed up as soon as possible or go to the Emergency Department if any problems should occur.  Please show the CHEMOTHERAPY ALERT CARD or IMMUNOTHERAPY  ALERT CARD at check-in to the Emergency Department and triage nurse.  Should you have questions after your visit or need to cancel or reschedule your appointment, please contact [facility] - A DEPT OF [facility]  Dept: [phone]  and follow the prompts.  Office hours are 8:00 a.m. to 4:30 p.m. Monday - Friday. Please note that voicemails left after 4:00 p.m. may not be returned until the following business day.  We are closed weekends and major holidays. You have access to a nurse at all times for urgent questions. Please call the main number to the clinic Dept: [phone] and follow the prompts.   For any non-urgent questions, you may also contact your provider using MyChart. We now offer e-Visits for anyone 78 and older to request care online for non-urgent symptoms. For details visit [email or web address].   Also download the MyChart app! Go to the app store, search "MyChart", open the app, select [facility], and log in with your MyChart username and password.

## 2024-02-05 ENCOUNTER — Other Ambulatory Visit: Payer: Self-pay

## 2024-02-05 ENCOUNTER — Telehealth: Payer: Self-pay

## 2024-02-05 ENCOUNTER — Other Ambulatory Visit: Payer: Self-pay | Admitting: Hematology and Oncology

## 2024-02-05 ENCOUNTER — Inpatient Hospital Stay (HOSPITAL_BASED_OUTPATIENT_CLINIC_OR_DEPARTMENT_OTHER): Admitting: Hematology and Oncology

## 2024-02-05 VITALS — BP 149/68 | HR 71 | Temp 97.7°F | Resp 18 | Ht 63.0 in | Wt 200.2 lb

## 2024-02-05 DIAGNOSIS — Z5112 Encounter for antineoplastic immunotherapy: Secondary | ICD-10-CM | POA: Diagnosis not present

## 2024-02-05 DIAGNOSIS — D693 Immune thrombocytopenic purpura: Secondary | ICD-10-CM

## 2024-02-05 DIAGNOSIS — R0600 Dyspnea, unspecified: Secondary | ICD-10-CM

## 2024-02-05 DIAGNOSIS — Z9079 Acquired absence of other genital organ(s): Secondary | ICD-10-CM | POA: Diagnosis not present

## 2024-02-05 DIAGNOSIS — Z90710 Acquired absence of both cervix and uterus: Secondary | ICD-10-CM | POA: Diagnosis not present

## 2024-02-05 DIAGNOSIS — Z7952 Long term (current) use of systemic steroids: Secondary | ICD-10-CM | POA: Diagnosis not present

## 2024-02-05 DIAGNOSIS — Z8542 Personal history of malignant neoplasm of other parts of uterus: Secondary | ICD-10-CM | POA: Diagnosis not present

## 2024-02-05 DIAGNOSIS — R0602 Shortness of breath: Secondary | ICD-10-CM | POA: Diagnosis not present

## 2024-02-05 DIAGNOSIS — Z90722 Acquired absence of ovaries, bilateral: Secondary | ICD-10-CM | POA: Diagnosis not present

## 2024-02-05 DIAGNOSIS — Z9221 Personal history of antineoplastic chemotherapy: Secondary | ICD-10-CM | POA: Diagnosis not present

## 2024-02-05 NOTE — Telephone Encounter (Signed)
 Called back and given below message. Scheduled appt at 1245 today, she is aware of appt.

## 2024-02-05 NOTE — Telephone Encounter (Signed)
 She needs to be evaluated, could be due to many things She can see her PCP or come in to see me Previously she does not want MD appointment due to high co-pay. Might be cheaper to see her PCP

## 2024-02-05 NOTE — Assessment & Plan Note (Addendum)
 Her examination today is benign I reassured the patient I suspect her shortness of breath on exertion could be due to deconditioning

## 2024-02-05 NOTE — Telephone Encounter (Signed)
 Returned her call. After she got home Friday after treatment she started having shortness of breath with minimal exertion. She is having sweating at times. Complaining that her heart feels funny and maybe she is having heart fluttering or maybe it is just anxiety. She is not having feet cramping that she has had in the past with treatment.  She is asking for appt or what does Dr. Bertis Ruddy recommend.

## 2024-02-05 NOTE — Assessment & Plan Note (Addendum)
 I do not see any benefit from rituximab yet She has appointment to go back to her primary care doctor for annual physical labs I will review her CBC result and call her with test results We will continue on prednisone taper after the test results are available

## 2024-02-05 NOTE — Progress Notes (Signed)
 [facility] OFFICE PROGRESS NOTE  Patient Care Team: Excell Seltzer, MD as PCP - General (Family Medicine) Jodelle Red, MD as PCP - Cardiology (Cardiology)  Assessment & Plan Dyspnea, unspecified type Her examination today is benign I reassured the patient I suspect her shortness of breath on exertion could be due to deconditioning Acute ITP (HCC) I do not see any benefit from rituximab yet She has appointment to go back to her primary care doctor for annual physical labs I will review her CBC result and call her with test results We will continue on prednisone taper after the test results are available  No orders of the defined types were placed in this encounter.    Artis Delay, MD  INTERVAL HISTORY: she returns for urgent evaluation She has been complaining of shortness of breath since she completed her treatment with rituximab last week No recent cough, fever or bleeding She has some mild nasal drainage She denies cramping  PHYSICAL EXAMINATION: ECOG PERFORMANCE STATUS: 1 - Symptomatic but completely ambulatory  Vitals:   02/05/24 1251 02/05/24 1303  BP: (!) 149/68 (!) 149/68  Pulse: 71 71  Resp: 18 18  Temp: 97.7 F (36.5 C) 97.7 F (36.5 C)  SpO2: 100% 100%   Filed Weights   02/05/24 1251 02/05/24 1303  Weight: 200 lb 3.2 oz (90.8 kg) 200 lb 3.2 oz (90.8 kg)  GENERAL:alert, no distress and comfortable SKIN: skin color, texture, turgor are normal, no rashes or significant lesions EYES: normal, conjunctiva are pink and non-injected, sclera clear OROPHARYNX:no exudate, no erythema and lips, buccal mucosa, and tongue normal  NECK: supple, thyroid normal size, non-tender, without nodularity LYMPH:  no palpable lymphadenopathy in the cervical, axillary or inguinal LUNGS: clear to auscultation and percussion with normal breathing effort HEART: regular rate & rhythm and no murmurs and no lower extremity edema ABDOMEN:abdomen soft, non-tender  and normal bowel sounds Musculoskeletal:no cyanosis of digits and no clubbing  PSYCH: alert & oriented x 3 with fluent speech NEURO: no focal motor/sensory deficits

## 2024-02-06 ENCOUNTER — Encounter: Payer: Self-pay | Admitting: Obstetrics & Gynecology

## 2024-02-09 ENCOUNTER — Encounter: Payer: Self-pay | Admitting: Family Medicine

## 2024-02-09 ENCOUNTER — Other Ambulatory Visit: Payer: Self-pay | Admitting: Family Medicine

## 2024-02-09 ENCOUNTER — Other Ambulatory Visit: Payer: BC Managed Care – PPO

## 2024-02-09 ENCOUNTER — Other Ambulatory Visit (INDEPENDENT_AMBULATORY_CARE_PROVIDER_SITE_OTHER)

## 2024-02-09 ENCOUNTER — Telehealth: Payer: Self-pay

## 2024-02-09 DIAGNOSIS — E785 Hyperlipidemia, unspecified: Secondary | ICD-10-CM

## 2024-02-09 DIAGNOSIS — D696 Thrombocytopenia, unspecified: Secondary | ICD-10-CM

## 2024-02-09 DIAGNOSIS — E1169 Type 2 diabetes mellitus with other specified complication: Secondary | ICD-10-CM

## 2024-02-09 DIAGNOSIS — E1159 Type 2 diabetes mellitus with other circulatory complications: Secondary | ICD-10-CM | POA: Diagnosis not present

## 2024-02-09 LAB — CBC WITH DIFFERENTIAL/PLATELET
Basophils Absolute: 0 10*3/uL (ref 0.0–0.1)
Basophils Relative: 0.4 % (ref 0.0–3.0)
Eosinophils Absolute: 0.1 10*3/uL (ref 0.0–0.7)
Eosinophils Relative: 1.8 % (ref 0.0–5.0)
HCT: 38.5 % (ref 36.0–46.0)
Hemoglobin: 12.5 g/dL (ref 12.0–15.0)
Lymphocytes Relative: 33 % (ref 12.0–46.0)
Lymphs Abs: 2.5 10*3/uL (ref 0.7–4.0)
MCHC: 32.5 g/dL (ref 30.0–36.0)
MCV: 84.9 fl (ref 78.0–100.0)
Monocytes Absolute: 0.6 10*3/uL (ref 0.1–1.0)
Monocytes Relative: 8.2 % (ref 3.0–12.0)
Neutro Abs: 4.2 10*3/uL (ref 1.4–7.7)
Neutrophils Relative %: 56.6 % (ref 43.0–77.0)
Platelets: 58 10*3/uL — ABNORMAL LOW (ref 150.0–400.0)
RBC: 4.54 Mil/uL (ref 3.87–5.11)
RDW: 16.1 % — ABNORMAL HIGH (ref 11.5–15.5)
WBC: 7.5 10*3/uL (ref 4.0–10.5)

## 2024-02-09 LAB — MICROALBUMIN / CREATININE URINE RATIO
Creatinine,U: 131.5 mg/dL
Microalb Creat Ratio: UNDETERMINED mg/g (ref 0.0–30.0)
Microalb, Ur: <0.7 mg/dL

## 2024-02-09 LAB — LIPID PANEL
Cholesterol: 202 mg/dL — ABNORMAL HIGH (ref 0–200)
HDL: 97.7 mg/dL (ref >39.00)
LDL Cholesterol: 73 mg/dL (ref 0–99)
NonHDL: 103.91
Total CHOL/HDL Ratio: 2
Triglycerides: 157 mg/dL — ABNORMAL HIGH (ref 0.0–149.0)
VLDL: 31.4 mg/dL (ref 0.0–40.0)

## 2024-02-09 LAB — HEMOGLOBIN A1C: Hgb A1c MFr Bld: 6.3 % (ref 4.6–6.5)

## 2024-02-09 NOTE — Telephone Encounter (Signed)
 Called and left a message asking her to call the office back.

## 2024-02-09 NOTE — Progress Notes (Signed)
 No critical labs need to be addressed urgently. We will discuss labs in detail at upcoming office visit.

## 2024-02-09 NOTE — Telephone Encounter (Signed)
Called and left below message. Ask her to call the office back. 

## 2024-02-09 NOTE — Progress Notes (Signed)
 That would be fine, I have ordered the test.

## 2024-02-09 NOTE — Telephone Encounter (Signed)
-----   Message from [employer] sent at 02/09/2024 11:54 AM EDT ----- I reviewed her CBC, no change I recommend taper prednisone to 5 mg daily, pls make changes to her medication I cancel her lab next week, Make another lab appt in 2 weeks, labs only

## 2024-02-12 NOTE — Telephone Encounter (Signed)
 Called her back and given below message. She verbalized understanding and scheduled lab appt on 4/4. She is aware of appt.

## 2024-02-14 ENCOUNTER — Other Ambulatory Visit

## 2024-02-14 ENCOUNTER — Inpatient Hospital Stay: Payer: BC Managed Care – PPO | Admitting: Obstetrics & Gynecology

## 2024-02-14 VITALS — BP 128/65 | HR 63 | Temp 98.5°F | Resp 19 | Wt 201.2 lb

## 2024-02-14 DIAGNOSIS — Z9079 Acquired absence of other genital organ(s): Secondary | ICD-10-CM | POA: Diagnosis not present

## 2024-02-14 DIAGNOSIS — Z90710 Acquired absence of both cervix and uterus: Secondary | ICD-10-CM | POA: Diagnosis not present

## 2024-02-14 DIAGNOSIS — Z8542 Personal history of malignant neoplasm of other parts of uterus: Secondary | ICD-10-CM | POA: Diagnosis not present

## 2024-02-14 DIAGNOSIS — Z08 Encounter for follow-up examination after completed treatment for malignant neoplasm: Secondary | ICD-10-CM | POA: Diagnosis not present

## 2024-02-14 DIAGNOSIS — Z90722 Acquired absence of ovaries, bilateral: Secondary | ICD-10-CM | POA: Diagnosis not present

## 2024-02-14 DIAGNOSIS — C549 Malignant neoplasm of corpus uteri, unspecified: Secondary | ICD-10-CM

## 2024-02-14 DIAGNOSIS — Z7952 Long term (current) use of systemic steroids: Secondary | ICD-10-CM | POA: Diagnosis not present

## 2024-02-14 DIAGNOSIS — R0602 Shortness of breath: Secondary | ICD-10-CM | POA: Diagnosis not present

## 2024-02-14 DIAGNOSIS — D693 Immune thrombocytopenic purpura: Secondary | ICD-10-CM | POA: Diagnosis not present

## 2024-02-14 DIAGNOSIS — Z1231 Encounter for screening mammogram for malignant neoplasm of breast: Secondary | ICD-10-CM | POA: Diagnosis not present

## 2024-02-14 DIAGNOSIS — Z9221 Personal history of antineoplastic chemotherapy: Secondary | ICD-10-CM | POA: Diagnosis not present

## 2024-02-14 DIAGNOSIS — Z5112 Encounter for antineoplastic immunotherapy: Secondary | ICD-10-CM | POA: Diagnosis not present

## 2024-02-14 LAB — HM MAMMOGRAPHY

## 2024-02-14 NOTE — Progress Notes (Unsigned)
 Follow Up Note: Gyn-Onc  Rachel Rivas 63 y.o. female  CC: She presents for a f/u visit   HPI: The oncology history was reviewed.  Interval History: She denies any vaginal bleeding, abdominal/pelvic pain, cough, lethargy or increasing abdominal girth.  Recently seen in follow-up by Dr. Bertis Ruddy after an acute episode of ITP--this is a chronic issue.  Review of Systems  Review of Systems  Constitutional:  Negative for malaise/fatigue and weight loss.  Respiratory:  Negative for shortness of breath and wheezing.   Cardiovascular:  Negative for chest pain and leg swelling.  Gastrointestinal:  Negative for abdominal pain, blood in stool, constipation, nausea and vomiting. Positive for diarrhea Genitourinary:  Negative for dysuria, frequency, hematuria and urgency.  Musculoskeletal:  Negative for joint pain and myalgias.  Neurological:  Negative for weakness.  Psychiatric/Behavioral:  Negative for depression. The patient does not have insomnia.    Current medications, allergy, social history, past surgical history, past medical history, family history were all reviewed.    Vitals:  LMP 11/17/2012    Physical Exam:  Physical Exam Exam conducted with a chaperone present.  Constitutional:      General: She is not in acute distress. Cardiovascular:     Rate and Rhythm: Normal rate and regular rhythm.  Pulmonary:     Effort: Pulmonary effort is normal.     Breath sounds: Normal breath sounds. No wheezing or rhonchi.  Abdominal:     Palpations: Abdomen is soft.     Tenderness: There is no abdominal tenderness. There is no right CVA tenderness or left CVA tenderness.     Hernia: No hernia is present.  Genitourinary:    General: Normal vulva.     Urethra: No urethral lesion.     Vagina: No lesions. No bleeding Musculoskeletal:     Cervical back: Neck supple.     Right lower leg: No edema.     Left lower leg: No edema.  Lymphadenopathy:     Upper Body:     Right upper body: No  supraclavicular adenopathy.     Left upper body: No supraclavicular adenopathy.     Lower Body: No right inguinal adenopathy. No left inguinal adenopathy.  Skin:    Findings: No rash.  Neurological:     Mental Status: She is oriented to person, place, and time.   Assessment/Plan: No problem-specific Assessment & Plan notes found for this encounter. No problem-specific Assessment & Plan notes found for this encounter.     I personally spent 25 minutes face-to-face and non-face-to-face in the care of this patient, which includes all pre, intra, and post visit time on the date of service.   Antionette Char, MD

## 2024-02-14 NOTE — Assessment & Plan Note (Signed)
 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  S/p adjuvant chemotherapy with 6 cycles of carboplatin and paclitaxel completed October, 2021. Negative symptom review, normal exam.  No evidence of recurrence   >recommend q 6 monthly surveillance examinations with symptom review, physical exam until October 2026  >- Schedule follow-up with oncologist in six months. - Ensure alternating follow-ups between gynecology and oncology every six months.

## 2024-02-14 NOTE — Patient Instructions (Signed)
 6 month follow up. Please call in July to schedule an appointment for September

## 2024-02-15 ENCOUNTER — Encounter: Payer: Self-pay | Admitting: Family Medicine

## 2024-02-15 ENCOUNTER — Encounter: Payer: Self-pay | Admitting: Obstetrics & Gynecology

## 2024-02-21 ENCOUNTER — Encounter: Payer: Self-pay | Admitting: Family Medicine

## 2024-02-21 ENCOUNTER — Ambulatory Visit (INDEPENDENT_AMBULATORY_CARE_PROVIDER_SITE_OTHER): Payer: BC Managed Care – PPO | Admitting: Family Medicine

## 2024-02-21 VITALS — BP 134/68 | HR 53 | Temp 97.3°F | Ht 63.75 in | Wt 199.0 lb

## 2024-02-21 DIAGNOSIS — Z8542 Personal history of malignant neoplasm of other parts of uterus: Secondary | ICD-10-CM

## 2024-02-21 DIAGNOSIS — D696 Thrombocytopenia, unspecified: Secondary | ICD-10-CM | POA: Diagnosis not present

## 2024-02-21 DIAGNOSIS — E1159 Type 2 diabetes mellitus with other circulatory complications: Secondary | ICD-10-CM

## 2024-02-21 DIAGNOSIS — M069 Rheumatoid arthritis, unspecified: Secondary | ICD-10-CM | POA: Diagnosis not present

## 2024-02-21 DIAGNOSIS — I152 Hypertension secondary to endocrine disorders: Secondary | ICD-10-CM

## 2024-02-21 DIAGNOSIS — Z0000 Encounter for general adult medical examination without abnormal findings: Secondary | ICD-10-CM

## 2024-02-21 DIAGNOSIS — E1169 Type 2 diabetes mellitus with other specified complication: Secondary | ICD-10-CM

## 2024-02-21 DIAGNOSIS — E785 Hyperlipidemia, unspecified: Secondary | ICD-10-CM

## 2024-02-21 DIAGNOSIS — Z7985 Long-term (current) use of injectable non-insulin antidiabetic drugs: Secondary | ICD-10-CM

## 2024-02-21 DIAGNOSIS — R61 Generalized hyperhidrosis: Secondary | ICD-10-CM

## 2024-02-21 LAB — HM DIABETES FOOT EXAM

## 2024-02-21 MED ORDER — TIRZEPATIDE 10 MG/0.5ML ~~LOC~~ SOAJ: 10.0000 mg | SUBCUTANEOUS | 3 refills | Status: DC

## 2024-02-21 NOTE — Assessment & Plan Note (Signed)
Chronic, stable control followed by rheumatology 

## 2024-02-21 NOTE — Assessment & Plan Note (Addendum)
 Chronic, now improving after infusions and prednisone 20, 10 now on 5 mg daily.  Dr. Berneda Rose rechecking  later this week to check for stability.

## 2024-02-21 NOTE — Assessment & Plan Note (Signed)
Followed by oncology Dr. Alvy Bimler. S/P hysterectomy and adjuvant chemotherapy.

## 2024-02-21 NOTE — Assessment & Plan Note (Signed)
 Chronic, well-controlled  Losartan 50 mg daily

## 2024-02-21 NOTE — Assessment & Plan Note (Signed)
 Encouraged exercise, weight loss, healthy eating habits. ? ?

## 2024-02-21 NOTE — Progress Notes (Signed)
 Patient ID: Rachel Rivas, female    DOB: Oct 01, 1961, 63 y.o.   MRN: [id number]  This visit was conducted in person.  BP 134/68   Pulse (!) 53   Temp (!) 97.3 F (36.3 C) (Temporal)   Ht 5' 3.75" (1.619 m)   Wt 199 lb (90.3 kg)   LMP 11/17/2012   SpO2 97%   BMI 34.43 kg/m    CC: Chief Complaint  Patient presents with   Annual Exam   Foot Cramping    Would like to discuss frequent foot cramping and something leg cramps. Ongoing for 6-7 months, getting worse in the past 1 month. Notices the cramping mainly at night, occasionally throughout the day.      Subjective:   HPI: Rachel Rivas is a 62 y.o. female presenting on 02/21/2024 for annual exam.  The patient presents for complete physical and review of chronic health problems. He/She also has the following acute concerns today:  Foot cramping x 6-7 months.. mainly at night.. worsening in last month  Her water intake is moderate.. has stopped soda  May in part be due to cholesterol med... but not gone after stopping.   Hx of Endometrial cancer: Followed by oncology Dr. Bertis Ruddy. S/P hysterectomy and adjuvant chemotherapy.  Hypertension:  Well controlled on losartan 50 mg dily BP Readings from Last 3 Encounters:  02/21/24 134/68  02/14/24 128/65  02/05/24 (!) 149/68  Using medication without problems or lightheadedness:  none Chest pain with exertion: none Edema:none Short of breath:  occ and occ palpitations since on prednisone Average home BPs: none Other issues:  S/P cholecystectomy.. 11/23/22.  Diabetes: Well-controlled  on Mounjaro 7.5 mg weekly.. worse given on prednisone 5 mg daily per platelets. Lab Results  Component Value Date   HGBA1C 6.3 02/09/2024  Using medications without difficulties: Hypoglycemic episodes: Hyperglycemic episodes: Feet problems: no ulcers Blood Sugars averaging: eye exam within last year: done  Wt Readings from Last 3 Encounters:  02/21/24 199 lb (90.3 kg)  02/14/24 201 lb  3.2 oz (91.3 kg)  02/05/24 200 lb 3.2 oz (90.8 kg)     Elevated Cholesterol: LDL at goal < 100 on crestor 5 mg  three days a week, improved SE on CoQ10... gets worse with this.  Has stopped crestor now.Marland Kitchen LDL still almost at goal < 70. Lab Results  Component Value Date   CHOL 202 (H) 02/09/2024   HDL 97.70 02/09/2024   LDLCALC 73 02/09/2024   TRIG 157.0 (H) 02/09/2024   CHOLHDL 2 02/09/2024  Using medications without problems: none Muscle aches: none Diet compliance: moderate Exercise:  she is going to [employer] Other complaints:    Mild intermittent asthma:  rarely using albuterol inhaler.   Rheumatoid arthritis: she has not restarted methotrexate, but pain is fairly well controlled. No hand pain.   Followed by  rheumatologist    Anemia and thrombocytopenia  ( due to autoimmune disease): Plt 59... but recent infusions and  now on prednisone daily... ,   Body mass index is 34.43 kg/m. Wt Readings from Last 3 Encounters:  02/21/24 199 lb (90.3 kg)  02/14/24 201 lb 3.2 oz (91.3 kg)  02/05/24 200 lb 3.2 oz (90.8 kg)    Relevant past medical, surgical, family and social history reviewed and updated as indicated. Interim medical history since our last visit reviewed. Allergies and medications reviewed and updated. Outpatient Medications Prior to Visit  Medication Sig Dispense Refill   albuterol (VENTOLIN HFA) 108 (90 Base) MCG/ACT inhaler Inhale  2 puffs into the lungs every 6 (six) hours as needed for wheezing or shortness of breath. 8 g 2   Coenzyme Q10 (COQ10 PO) Take 2 each by mouth daily.     cyclobenzaprine (FLEXERIL) 10 MG tablet Take 1 tablet (10 mg total) by mouth at bedtime as needed for muscle spasms. 15 tablet 0   diclofenac Sodium (VOLTAREN) 1 % GEL Apply topically.     famotidine (PEPCID) 20 MG tablet TAKE 1 TABLET BY MOUTH TWICE DAILY FOR HEART 180 tablet 3   losartan (COZAAR) 50 MG tablet Take 1 tablet (50 mg total) by mouth daily. 90 tablet 3   Multiple Vitamin  (MULTIVITAMIN ADULT PO) Take by mouth.     predniSONE (DELTASONE) 5 MG tablet Take 1 tablet (5 mg total) by mouth daily with breakfast. (Patient taking differently: Take 5 mg by mouth daily with breakfast. 5 mg daily starting 02/09/24) 30 tablet 1   rosuvastatin (CRESTOR) 5 MG tablet Take 1 tablet (5 mg total) by mouth daily. Please keep your upcoming appointment for refills. 90 tablet 0   triamcinolone cream (KENALOG) 0.1 % Apply 1 Application topically 2 (two) times daily. 30 g 0   VITAMIN D PO Take 5,000 Units by mouth daily.     tirzepatide (MOUNJARO) 7.5 MG/0.5ML Pen Inject 7.5 mg into the skin once a week. 6 mL 3   No facility-administered medications prior to visit.     Per HPI unless specifically indicated in ROS section below Review of Systems  Constitutional:  Negative for fatigue and fever.  HENT:  Negative for congestion.   Eyes:  Negative for pain.  Respiratory:  Negative for cough and shortness of breath.   Cardiovascular:  Negative for chest pain, palpitations and leg swelling.  Gastrointestinal:  Negative for abdominal pain.  Genitourinary:  Negative for dysuria and vaginal bleeding.  Musculoskeletal:  Negative for back pain.  Neurological:  Negative for syncope, light-headedness and headaches.  Psychiatric/Behavioral:  Negative for dysphoric mood.    Objective:  BP 134/68   Pulse (!) 53   Temp (!) 97.3 F (36.3 C) (Temporal)   Ht 5' 3.75" (1.619 m)   Wt 199 lb (90.3 kg)   LMP 11/17/2012   SpO2 97%   BMI 34.43 kg/m   Wt Readings from Last 3 Encounters:  02/21/24 199 lb (90.3 kg)  02/14/24 201 lb 3.2 oz (91.3 kg)  02/05/24 200 lb 3.2 oz (90.8 kg)      Physical Exam Vitals and nursing note reviewed.  Constitutional:      General: She is not in acute distress.    Appearance: Normal appearance. She is well-developed. She is not ill-appearing or toxic-appearing.  HENT:     Head: Normocephalic.     Right Ear: Hearing, tympanic membrane, ear canal and external  ear normal.     Left Ear: Hearing, tympanic membrane, ear canal and external ear normal.     Nose: Nose normal.  Eyes:     General: Lids are normal. Lids are everted, no foreign bodies appreciated.     Conjunctiva/sclera: Conjunctivae normal.     Pupils: Pupils are equal, round, and reactive to light.  Neck:     Thyroid: No thyroid mass or thyromegaly.     Vascular: No carotid bruit.     Trachea: Trachea normal.  Cardiovascular:     Rate and Rhythm: Normal rate and regular rhythm.     Heart sounds: Normal heart sounds, S1 normal and S2 normal. No murmur  heard.    No gallop.  Pulmonary:     Effort: Pulmonary effort is normal. No respiratory distress.     Breath sounds: Normal breath sounds. No wheezing, rhonchi or rales.  Abdominal:     General: Bowel sounds are normal. There is no distension or abdominal bruit.     Palpations: Abdomen is soft. There is no fluid wave or mass.     Tenderness: There is no abdominal tenderness. There is no guarding or rebound.     Hernia: No hernia is present.  Musculoskeletal:     Cervical back: Normal range of motion and neck supple.  Lymphadenopathy:     Cervical: No cervical adenopathy.  Skin:    General: Skin is warm and dry.     Findings: No rash.  Neurological:     Mental Status: She is alert and oriented to person, place, and time.     Cranial Nerves: No cranial nerve deficit.     Sensory: No sensory deficit.     Comments:  Vertigo with leaning back on table, nystagmus.  Psychiatric:        Mood and Affect: Mood is not anxious or depressed.        Speech: Speech normal.        Behavior: Behavior normal. Behavior is cooperative.        Judgment: Judgment normal.     Diabetic foot exam: Normal inspection No skin breakdown No calluses  Normal DP pulses Normal sensation to light touch and monofilament Nails normal     Results for orders placed or performed in visit on 02/21/24  HM DIABETES FOOT EXAM   Collection Time: 02/21/24  12:00 AM  Result Value Ref Range   [facility] Diabetic Foot Exam done     This visit occurred during the SARS-CoV-2 public health emergency.  Safety protocols were in place, including screening questions prior to the visit, additional usage of staff PPE, and extensive cleaning of exam room while observing appropriate contact time as indicated for disinfecting solutions.   COVID 19 screen:  No recent travel or known exposure to COVID19 The patient denies respiratory symptoms of COVID 19 at this time. The importance of social distancing was discussed today.   Assessment and Plan The patient's preventative maintenance and recommended screening tests for an annual wellness exam were reviewed in full today. Brought up to date unless services declined.  Counselled on the importance of diet, exercise, and its role in overall health and mortality. The patient's FH and SH was reviewed, including their home life, tobacco status, and drug and alcohol status.   Mammo: 01/2024 normal  Colon: 07/07/22 Dr. Adela Lank, repeat in 7 years. Former smoker minimal use, 3 pack years, remote Vaccines: s/p COVID x 3, offered td and flu (refused).. can consider shingrix ( refused)  TAH s/p uterine cancer    Hep C neg  HIV: refused Bone density:   01/2023 normal, repeat in 5 years.  Problem List Items Addressed This Visit     History of endometrial cancer   Followed by oncology Dr. Bertis Ruddy. S/P hysterectomy and adjuvant chemotherapy.      Hyperlipidemia associated with type 2 diabetes mellitus (HCC)   Chronic, excellent control with LDL at goal  but not on statin. DID not tolerate even low dose crestor.   Will  re consider starting low dose pravastatin for cardiac protection  once platelets stabilize.      Relevant Medications   tirzepatide (MOUNJARO) 10 MG/0.5ML Pen  Hypertension associated with type 2 diabetes mellitus (HCC)   Chronic, well-controlled  Losartan 50 mg daily       Relevant Medications    tirzepatide (MOUNJARO) 10 MG/0.5ML Pen   Rheumatoid arthritis (HCC) (Chronic)   Chronic, stable control followed by rheumatology      Severe obesity (BMI 35.0-39.9) with comorbidity (HCC)   Encouraged exercise, weight loss, healthy eating habits.       Relevant Medications   tirzepatide (MOUNJARO) 10 MG/0.5ML Pen   Thrombocytopenia (HCC)    Chronic, now improving after infusions and prednisone 20, 10 now on 5 mg daily.  Dr. Berneda Rose rechecking  later this week to check for stability.      Type 2 diabetes mellitus with other circulatory complications (HCC)   Stable, chronic.  Continue current medication.  Now on daily prednsione for thrombocytopenia... may need to increase this again.  Increase  Mounjaro 10 mg weekly.      Relevant Medications   tirzepatide (MOUNJARO) 10 MG/0.5ML Pen   Other Visit Diagnoses       Routine general medical examination at a health care facility    -  Primary     Night sweats       Relevant Orders   TSH        Meds ordered this encounter  Medications   tirzepatide (MOUNJARO) 10 MG/0.5ML Pen    Sig: Inject 10 mg into the skin once a week.    Dispense:  6 mL    Refill:  3     Kerby Nora, MD

## 2024-02-21 NOTE — Assessment & Plan Note (Addendum)
 Stable, chronic.  Continue current medication.  Now on daily prednsione for thrombocytopenia... may need to increase this again.  Increase  Mounjaro 10 mg weekly.

## 2024-02-21 NOTE — Assessment & Plan Note (Addendum)
 Chronic, excellent control with LDL at goal  but not on statin. DID not tolerate even low dose crestor.   Will  re consider starting low dose pravastatin for cardiac protection  once platelets stabilize.

## 2024-02-23 ENCOUNTER — Inpatient Hospital Stay: Attending: Hematology and Oncology

## 2024-02-23 ENCOUNTER — Telehealth: Payer: Self-pay

## 2024-02-23 DIAGNOSIS — D696 Thrombocytopenia, unspecified: Secondary | ICD-10-CM | POA: Diagnosis not present

## 2024-02-23 LAB — CBC WITH DIFFERENTIAL/PLATELET
Abs Immature Granulocytes: 0.04 10*3/uL (ref 0.00–0.07)
Basophils Absolute: 0 10*3/uL (ref 0.0–0.1)
Basophils Relative: 0 %
Eosinophils Absolute: 0.1 10*3/uL (ref 0.0–0.5)
Eosinophils Relative: 1 %
HCT: 39.9 % (ref 36.0–46.0)
Hemoglobin: 12.7 g/dL (ref 12.0–15.0)
Immature Granulocytes: 1 %
Lymphocytes Relative: 34 %
Lymphs Abs: 2.5 10*3/uL (ref 0.7–4.0)
MCH: 27.4 pg (ref 26.0–34.0)
MCHC: 31.8 g/dL (ref 30.0–36.0)
MCV: 86 fL (ref 80.0–100.0)
Monocytes Absolute: 0.7 10*3/uL (ref 0.1–1.0)
Monocytes Relative: 10 %
Neutro Abs: 4.1 10*3/uL (ref 1.7–7.7)
Neutrophils Relative %: 54 %
Platelets: 53 10*3/uL — ABNORMAL LOW (ref 150–400)
RBC: 4.64 MIL/uL (ref 3.87–5.11)
RDW: 15.4 % (ref 11.5–15.5)
WBC: 7.4 10*3/uL (ref 4.0–10.5)
nRBC: 0 % (ref 0.0–0.2)

## 2024-02-23 NOTE — Telephone Encounter (Signed)
 Called per Dr. Bertis Ruddy, platelets 53 today, Instructed to stop prednisone due to not making a difference in platelet count. Scheduled appt on 5/2 for lab and MD. She verbalized understanding and is aware of appts.

## 2024-03-06 ENCOUNTER — Encounter: Payer: Self-pay | Admitting: Family Medicine

## 2024-03-07 ENCOUNTER — Telehealth: Payer: Self-pay | Admitting: Family Medicine

## 2024-03-07 NOTE — Telephone Encounter (Signed)
 Copied from CRM [id number]. Topic: Appointments - Scheduling Inquiry for Clinic >> Mar 07, 2024  3:48 PM Marlan Silva wrote: Reason for CRM: Patient called in stating that dr. Cherlyn Cornet advised her to come to see her for her swollen ankles. I checked and her next available is not until Tuesday. Patient states that Dr, Cherlyn Cornet advised her to come in sooner than that. She wants to know if she can be squeezed on the schedule and if she can, to please call her and let her know.

## 2024-03-11 NOTE — Telephone Encounter (Signed)
 Dr. Cherlyn Cornet is not back in office until Tuesday.  Please call to schedule her with a different provider today if she can't wait until tomorrow.

## 2024-03-11 NOTE — Telephone Encounter (Signed)
 Spoke with patient and she stated that the swelling has gone down now. She stated that she will keep a check on it throughout the week and schedule something if they swell up again.

## 2024-03-11 NOTE — Telephone Encounter (Signed)
 LVM for patient to call and schedule first available on tues

## 2024-03-22 ENCOUNTER — Encounter: Payer: Self-pay | Admitting: Hematology and Oncology

## 2024-03-22 ENCOUNTER — Inpatient Hospital Stay: Attending: Hematology and Oncology

## 2024-03-22 ENCOUNTER — Inpatient Hospital Stay: Admitting: Hematology and Oncology

## 2024-03-22 VITALS — BP 127/61 | HR 68 | Temp 98.0°F | Resp 18 | Ht 63.75 in | Wt 197.6 lb

## 2024-03-22 DIAGNOSIS — Z9079 Acquired absence of other genital organ(s): Secondary | ICD-10-CM | POA: Insufficient documentation

## 2024-03-22 DIAGNOSIS — Z90722 Acquired absence of ovaries, bilateral: Secondary | ICD-10-CM | POA: Diagnosis not present

## 2024-03-22 DIAGNOSIS — D696 Thrombocytopenia, unspecified: Secondary | ICD-10-CM | POA: Diagnosis not present

## 2024-03-22 DIAGNOSIS — C549 Malignant neoplasm of corpus uteri, unspecified: Secondary | ICD-10-CM | POA: Diagnosis not present

## 2024-03-22 DIAGNOSIS — Z8542 Personal history of malignant neoplasm of other parts of uterus: Secondary | ICD-10-CM | POA: Diagnosis not present

## 2024-03-22 DIAGNOSIS — Z90710 Acquired absence of both cervix and uterus: Secondary | ICD-10-CM | POA: Insufficient documentation

## 2024-03-22 LAB — CBC WITH DIFFERENTIAL/PLATELET
Abs Immature Granulocytes: 0.02 10*3/uL (ref 0.00–0.07)
Basophils Absolute: 0 10*3/uL (ref 0.0–0.1)
Basophils Relative: 0 %
Eosinophils Absolute: 0.1 10*3/uL (ref 0.0–0.5)
Eosinophils Relative: 2 %
HCT: 38.1 % (ref 36.0–46.0)
Hemoglobin: 12.5 g/dL (ref 12.0–15.0)
Immature Granulocytes: 0 %
Lymphocytes Relative: 39 %
Lymphs Abs: 2.3 10*3/uL (ref 0.7–4.0)
MCH: 27.6 pg (ref 26.0–34.0)
MCHC: 32.8 g/dL (ref 30.0–36.0)
MCV: 84.1 fL (ref 80.0–100.0)
Monocytes Absolute: 0.6 10*3/uL (ref 0.1–1.0)
Monocytes Relative: 11 %
Neutro Abs: 2.8 10*3/uL (ref 1.7–7.7)
Neutrophils Relative %: 48 %
Platelets: 59 10*3/uL — ABNORMAL LOW (ref 150–400)
RBC: 4.53 MIL/uL (ref 3.87–5.11)
RDW: 14.4 % (ref 11.5–15.5)
WBC: 5.9 10*3/uL (ref 4.0–10.5)
nRBC: 0 % (ref 0.0–0.2)

## 2024-03-22 NOTE — Assessment & Plan Note (Addendum)
 She is not symptomatic Last imaging study and pelvic exam by gynecologist was negative for recurrent disease. I plan to order CT imaging once a year Her next imaging study will be due in January 2026

## 2024-03-22 NOTE — Progress Notes (Signed)
 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 I plan to order CT imaging once a year Her next imaging study will be due in January 2026 Thrombocytopenia [facility]) The patient has minimal response to prednisone  She has no response to rituximab  We discussed risk and benefits of pursuing further treatment Currently, she is not symptomatic The threshold for treatment is with platelet count less than 50,000 Her platelet count today is 59,000 If she were to pursue additional therapy, I will recommend bone marrow biopsy followed by either Nplate or Promacta assuming bone marrow biopsy is negative The patient would like to think about it If I do not hear back from her, I will assume she wants surveillance/observation and I will see her back in January as above  Orders Placed This Encounter  Procedures   CT ABDOMEN PELVIS W CONTRAST    Standing Status:   Future    Expected Date:   12/09/2024    Expiration Date:   03/22/2025    Scheduling Instructions:     No oral contrast    If indicated for the ordered procedure, I authorize the administration of contrast media per Radiology protocol:   Yes    Does the patient have a contrast media/X-ray dye allergy?:   No    Preferred imaging location?:   [facility]    If indicated for the ordered procedure, I authorize the administration of oral contrast media per Radiology protocol:   No    Reason for no oral contrast::   No need oral contrast   CMP (Cancer Center only)    Standing Status:   Future    Expiration Date:   03/22/2025   CBC with Differential (Cancer Center Only)    Standing Status:   Future    Expiration Date:   03/22/2025     Almeda Jacobs, MD  INTERVAL HISTORY: she returns for surveillance follow-up for history of uterine cancer and ITP She denies recent bleeding She is very active with activities of daily living and start exercising at least twice weekly The patient denies any recent signs or symptoms of bleeding such as spontaneous epistaxis, hematuria or hematochezia. No signs or symptoms of cancer recurrence We reviewed test results and discussed future plan of care  PHYSICAL EXAMINATION: ECOG PERFORMANCE STATUS: 0 - Asymptomatic  Vitals:   03/22/24 0818  BP: 127/61  Pulse: 68  Resp: 18  Temp: 98 F (36.7 C)  SpO2: 100%   Filed Weights   03/22/24 0818  Weight: 197 lb 9.6 oz (89.6 kg)    Relevant data reviewed during this visit included CBC Oncology History Overview Note  MSI stable   Adenosarcoma of body of uterus (HCC)  03/03/2020 Initial Diagnosis   She developed PMB in early April after going through menopause at approximately age 63. She saw her PCP on 4/13 for bleeding and an ultrasound was obtained showing a thickened endometrial lining and 3-4cm mass within the endocervical canal. She was referred to [facility], started on Megace  and ultimately underwent a TRH/BSO on 5/18 for abnormal uterine bleeding with resulting anemia, large prolapsing fibroid.    03/06/2020 Imaging   US  pelvis Markedly thickened heterogeneous, and hypervascular endometrial complex up to 38  mm thick highly concerning for endometrial neoplasm.   Additional more focal 3.0 x 2.8 x 5.3 cm diameter mass within endocervical canal, may represent extension of above endometrial pathology versus separate endocervical tumor.   Tissue diagnosis recommended.   Nonvisualization of ovaries.   04/07/2020 Pathology Results   A. UTERUS, CERVIX AND BILATERAL FALLOPIAN TUBES AND OVARIES, HYSTERECTOMY AND BILATERAL SALPINGO-OOPHORECTOMY: - Mullerian adenosarcoma of endometrium. - Tumor limited to the uterus. - Tumor invades to less than half  of the myometrium. - No involvement of adnexa. - Margins of resection are not involved. - See oncology table and comment. ONCOLOGY TABLE: UTERUS, SARCOMA: Procedure: Total hysterectomy and bilateral salpingo-oophorectomy Specimen Integrity: The uterus, cervix and bilateral fallopian tubes and ovaries were received intact. The nodule clinically identified as vaginally prolapsed uterine fibroid was received separate and disrupted. Tumor Size: Greatest dimension: 7.5 cm Histologic Type: Mullerian adenosarcoma with overgrowth of the epithelial and stromal components Histologic Grade: The epithelial component has proliferated to form endometrioid adenocarcinoma which ranges from low grade to high grade. The stromal component focally shows sarcomatous overgrowth. Myometrial Invasion: Present Depth of invasion: 4 mm Myometrial thickness: 30 mm Other Tissue/Organ Involvement: Not identified Margins: Uninvolved by sarcoma Lymphovascular Invasion: Not identified Regional Lymph Nodes: No lymph nodes submitted or found Pathologic Stage Classification (pTNM, AJCC 8th Edition): pT1b, pNX Additional Pathologic Findings: Adenomyosis. Leiomyomata.   04/07/2020 Surgery   PRE-OPERATIVE DIAGNOSIS:  Large prolapsed fibroid vaginally, menometrorrhagia with anemia   POST-OPERATIVE DIAGNOSIS:  Large prolapsed fibroid vaginally, menometrorrhagia with anemia.  Omental adhesions with anterior abdominal wall.   PROCEDURE:  Procedure(s): XI ROBOTIC TOTAL LAPAROSCOPIC HYSTERECTOMY, BILATERAL SALPINGO OOPHORECTOMY/LYSIS OF ADHESIONS/VAGINAL MYOMECTOMY    FINDINGS: Large necrotic vaginally prolapsed uterine myoma.  Uterus with small subserosal/intramural myoma.  Bilateral tubes post tubal ligation.  Bilateral normal ovaries.  Omental adhesions with anterior abdominal wall.   05/01/2020 Imaging   Ct scan of chest, abdomen and pelvis 1. Multiple ill-defined low density liver masses, suspicious for metastases. These  could be further characterized with pre and postcontrast magnetic resonance imaging of the liver. 2. Moderate diffuse vaginal wall thickening, possibly representing edema associated with the patient's recent surgery. 3. Small hiatal hernia. 4. Minimal coronary artery atheromatous calcifications. 5. Cholelithiasis. 6. Tiny rounded area of low density in the posterior spleen, too small to characterize.   05/11/2020 Cancer Staging   Staging form: Corpus Uteri - Adenosarcoma, AJCC 8th Edition - Pathologic: Stage IB (pT1b, pN0, cM0) - Signed by Almeda Jacobs, MD on 05/15/2020   05/14/2020 Imaging   MRI liver 1. The 2 dominant right hepatic lobe lesions have MR imaging features most consistent with benign cavernous hemangioma. The remaining tiny liver lesions are too small to characterize. Statistically, while these are likely benign, follow-up MRI in 3 months recommended to ensure stability. 2. No other findings to suggest metastatic disease.   05/22/2020 Procedure   Placement of a subcutaneous port device. Catheter tip at the superior cavoatrial junction   05/29/2020 - 09/11/2020 Chemotherapy   The patient had carboplatin  and taxol  for chemotherapy treatment.     07/22/2020 Imaging   1. Status post hysterectomy and bilateral oophorectomy. Irregular soft tissue density in the region of the vaginal cuff is relatively similar to on the prior exam. Although this could represent developing scar or chronic hematoma, residual disease cannot be excluded. This could either be re-evaluated at follow-up or if a more aggressive approach is desired, characterized with PET. 2. No other evidence of metastatic disease in the  abdomen or pelvis. 3. Hepatic hemangiomas, as before. Smaller liver lesions are unchanged, favoring a benign etiology. 4. Cholelithiasis. 5. Tiny hiatal hernia. 6. Aortic Atherosclerosis (ICD10-I70.0).   10/12/2020 Imaging   1. Stable appearance of the vaginal cuff status post hysterectomy.  Soft tissue fullness appears unchanged, likely postsurgical in etiology. Correlate with physical examination and tumor markers. 2. No evidence of metastatic disease. 3. Stable hepatic hemangiomas. 4. Cholelithiasis without evidence of cholecystitis or biliary dilatation. 5. Aortic Atherosclerosis (ICD10-I70.0).   04/12/2021 Imaging   1. Status post hysterectomy. Unchanged appearance of somewhat nodular soft tissue at the left and right aspects of the vaginal cuff, again favored to represent postoperative change without specific findings or evidence of progression to suggest local malignant recurrence. 2. No noncontrast evidence of metastatic disease in the abdomen or pelvis. 3. Cholelithiasis.   Aortic Atherosclerosis (ICD10-I70.0).   04/30/2021 Procedure   Removal of implanted Port-A-Cath utilizing sharp and blunt dissection. The procedure was uncomplicated.   10/31/2022 Imaging   1. Stable CT of the abdomen and pelvis. No specific findings identified to suggest residual or recurrent tumor or metastatic disease. 2. Stable liver hemangiomas. 3. Gallstone.   12/09/2023 Imaging   CT CHEST ABDOMEN PELVIS W CONTRAST Result Date: 12/10/2023 CLINICAL DATA:  Uterine adenosarcoma restaging, ITP * Tracking Code: BO * EXAM: CT CHEST, ABDOMEN, AND PELVIS WITH CONTRAST TECHNIQUE: Multidetector CT imaging of the chest, abdomen and pelvis was performed following the standard protocol during bolus administration of intravenous contrast. RADIATION DOSE REDUCTION: This exam was performed according to the departmental dose-optimization program which includes automated exposure control, adjustment of the mA and/or kV according to patient size and/or use of iterative reconstruction technique. CONTRAST:  90mL OMNIPAQUE  IOHEXOL  300 MG/ML  SOLN COMPARISON:  CT abdomen pelvis, 10/27/2022, CT chest, 05/01/2020 MR abdomen, 05/14/2020 FINDINGS: CT CHEST FINDINGS Cardiovascular: No significant vascular findings. Normal  heart size. No pericardial effusion. Mediastinum/Nodes: No enlarged mediastinal, hilar, or axillary lymph nodes. Unchanged thymic remnant or rebound in the anterior mediastinum. Thyroid  gland, trachea, and esophagus demonstrate no significant findings. Lungs/Pleura: Lungs are clear. No pleural effusion or pneumothorax. Musculoskeletal: No chest wall abnormality. No acute osseous findings. CT ABDOMEN PELVIS FINDINGS Hepatobiliary: Unchanged hepatic hemangiomas of the liver dome, previously characterized by MR, benign, requiring no further follow-up or characterization (series 2, image 43). Status post cholecystectomy. No biliary dilatation. Pancreas: Unremarkable. No pancreatic ductal dilatation or surrounding inflammatory changes. Spleen: Normal in size without significant abnormality. Adrenals/Urinary Tract: Adrenal glands are unremarkable. Kidneys are normal, without renal calculi, solid lesion, or hydronephrosis. Bladder is unremarkable. Stomach/Bowel: Stomach is within normal limits. Appendix appears normal. No evidence of bowel wall thickening, distention, or inflammatory changes. Vascular/Lymphatic: Scattered aortic atherosclerosis. No enlarged abdominal or pelvic lymph nodes. Reproductive: Unchanged postoperative appearance of the pelvis status post hysterectomy (series 2, image 104). Other: No abdominal wall hernia or abnormality. No ascites. Musculoskeletal: No acute osseous findings. IMPRESSION: 1. Unchanged postoperative appearance of the pelvis status post hysterectomy. 2. No evidence of lymphadenopathy or metastatic disease in the chest, abdomen, or pelvis. 3. Status post cholecystectomy. Aortic Atherosclerosis (ICD10-I70.0). Electronically Signed   By: Fredricka Jenny M.D.   On: 12/10/2023 08:04

## 2024-03-22 NOTE — Assessment & Plan Note (Addendum)
 The patient has minimal response to prednisone  She has no response to rituximab  We discussed risk and benefits of pursuing further treatment Currently, she is not symptomatic The threshold for treatment is with platelet count less than 50,000 Her platelet count today is 59,000 If she were to pursue additional therapy, I will recommend bone marrow biopsy followed by either Nplate or Promacta assuming bone marrow biopsy is negative The patient would like to think about it If I do not hear back from her, I will assume she wants surveillance/observation and I will see her back in January as above

## 2024-04-17 ENCOUNTER — Encounter: Payer: Self-pay | Admitting: Family Medicine

## 2024-04-17 MED ORDER — TRIAMCINOLONE ACETONIDE 0.1 % EX CREA: 1.0000 | TOPICAL_CREAM | Freq: Two times a day (BID) | CUTANEOUS | 0 refills | Status: AC

## 2024-04-17 NOTE — Telephone Encounter (Signed)
 Last office visit 02/21/2024 for CPE.  Last refilled 08/30/2023 for 30 g with no refills.  Next Appt: 05/31/24 for DM.

## 2024-05-31 ENCOUNTER — Ambulatory Visit: Payer: Self-pay | Admitting: *Deleted

## 2024-05-31 ENCOUNTER — Ambulatory Visit (INDEPENDENT_AMBULATORY_CARE_PROVIDER_SITE_OTHER): Admitting: Family Medicine

## 2024-05-31 ENCOUNTER — Telehealth: Payer: Self-pay

## 2024-05-31 VITALS — BP 110/70 | HR 74 | Temp 98.3°F | Ht 63.75 in | Wt 189.0 lb

## 2024-05-31 DIAGNOSIS — L989 Disorder of the skin and subcutaneous tissue, unspecified: Secondary | ICD-10-CM | POA: Insufficient documentation

## 2024-05-31 DIAGNOSIS — I152 Hypertension secondary to endocrine disorders: Secondary | ICD-10-CM

## 2024-05-31 DIAGNOSIS — C549 Malignant neoplasm of corpus uteri, unspecified: Secondary | ICD-10-CM | POA: Diagnosis not present

## 2024-05-31 DIAGNOSIS — D696 Thrombocytopenia, unspecified: Secondary | ICD-10-CM

## 2024-05-31 DIAGNOSIS — E1159 Type 2 diabetes mellitus with other circulatory complications: Secondary | ICD-10-CM

## 2024-05-31 LAB — CBC WITH DIFFERENTIAL/PLATELET
Basophils Absolute: 0 K/uL (ref 0.0–0.1)
Basophils Relative: 0.4 % (ref 0.0–3.0)
Eosinophils Absolute: 0.1 K/uL (ref 0.0–0.7)
Eosinophils Relative: 1.6 % (ref 0.0–5.0)
HCT: 38.8 % (ref 36.0–46.0)
Hemoglobin: 12.8 g/dL (ref 12.0–15.0)
Lymphocytes Relative: 34 % (ref 12.0–46.0)
Lymphs Abs: 1.9 K/uL (ref 0.7–4.0)
MCHC: 33.1 g/dL (ref 30.0–36.0)
MCV: 82.6 fl (ref 78.0–100.0)
Monocytes Absolute: 0.6 K/uL (ref 0.1–1.0)
Monocytes Relative: 11.3 % (ref 3.0–12.0)
Neutro Abs: 2.9 K/uL (ref 1.4–7.7)
Neutrophils Relative %: 52.7 % (ref 43.0–77.0)
Platelets: 48 K/uL — CL (ref 150.0–400.0)
RBC: 4.69 Mil/uL (ref 3.87–5.11)
RDW: 14.4 % (ref 11.5–15.5)
WBC: 5.5 K/uL (ref 4.0–10.5)

## 2024-05-31 LAB — COMPREHENSIVE METABOLIC PANEL WITH GFR
ALT: 14 U/L (ref 0–35)
AST: 14 U/L (ref 0–37)
Albumin: 4.6 g/dL (ref 3.5–5.2)
Alkaline Phosphatase: 72 U/L (ref 39–117)
BUN: 11 mg/dL (ref 6–23)
CO2: 33 meq/L — ABNORMAL HIGH (ref 19–32)
Calcium: 9.7 mg/dL (ref 8.4–10.5)
Chloride: 101 meq/L (ref 96–112)
Creatinine, Ser: 0.78 mg/dL (ref 0.40–1.20)
GFR: 80.16 mL/min (ref >60.00)
Glucose, Bld: 85 mg/dL (ref 70–99)
Potassium: 3.7 meq/L (ref 3.5–5.1)
Sodium: 139 meq/L (ref 135–145)
Total Bilirubin: 1 mg/dL (ref 0.2–1.2)
Total Protein: 7.1 g/dL (ref 6.0–8.3)

## 2024-05-31 LAB — POCT GLYCOSYLATED HEMOGLOBIN (HGB A1C): Hemoglobin A1C: 5.3 % (ref 4.0–5.6)

## 2024-05-31 MED ORDER — CYCLOBENZAPRINE HCL 10 MG PO TABS: 10.0000 mg | ORAL_TABLET | Freq: Every evening | ORAL | 0 refills | Status: AC | PRN

## 2024-05-31 NOTE — Assessment & Plan Note (Signed)
 Chronic skin lesion, most likely hyperpigmentation over cyst at area with previous inflammation. She can try topical steroid cream twice daily to help area fade and decrease inflammation.  No current evidence of infection. She is having some additional skin issues on face and so we will refer her to dermatology for both issues.

## 2024-05-31 NOTE — Telephone Encounter (Signed)
 Noted.  Contacted patient and forwarded information to her hematologist.  History of thrombocytopenia. ER precautions provided given platelets have dropped below 50.  No current bleeding noted at office visit today.

## 2024-05-31 NOTE — Progress Notes (Signed)
 Patient ID: Rachel Rivas, female    DOB: 04-28-1961, 63 y.o.   MRN: [id number]  This visit was conducted in person.  BP 110/70   Pulse 74   Temp 98.3 F (36.8 C) (Temporal)   Ht 5' 3.75 (1.619 m)   Wt 189 lb (85.7 kg)   LMP 11/17/2012   SpO2 95%   BMI 32.70 kg/m    CC: Chief Complaint  Patient presents with   Diabetes     Subjective:   HPI: Rachel Rivas is a 63 y.o. female presenting on 05/31/2024  for DM follow up  Hypertension:  Well controlled on losartan  50 mg dily BP Readings from Last 3 Encounters:  05/31/24 110/70  03/22/24 127/61  02/21/24 134/68  Using medication without problems or lightheadedness:  none Chest pain with exertion: none Edema:none Short of breath:  occ and occ palpitations since on prednisone  Average home BPs: none Other issues:  S/P cholecystectomy.. 11/23/22.  Diabetes: Well-controlled  on Mounjaro  10 mg weekly..  She has now been off on prednisone  5 mg daily per platelets in last 2 months. Lab Results  Component Value Date   HGBA1C 5.3 05/31/2024  Using medications without difficulties: Hypoglycemic episodes: Hyperglycemic episodes: Feet problems: no ulcers Blood Sugars averaging: eye exam within last year: done   She has lost 8 lbs in last 3 months Wt Readings from Last 3 Encounters:  05/31/24 189 lb (85.7 kg)  03/22/24 197 lb 9.6 oz (89.6 kg)  02/21/24 199 lb (90.3 kg)  Body mass index is 32.7 kg/m.   Anemia and thrombocytopenia  ( due to autoimmune disease): 03/2024 Plt 59... but recent infusions and  now on prednisone  daily... Per patient due for labs for Dr. Farrell off of prednisone .  Next visit with hematology in January 2026  Body mass index is 32.7 kg/m. Wt Readings from Last 3 Encounters:  05/31/24 189 lb (85.7 kg)  03/22/24 197 lb 9.6 oz (89.6 kg)  02/21/24 199 lb (90.3 kg)   Skin lesion left lower leg.. years ago started as ingrown hair.. now darker firm lesion.. not changing.  Relevant past medical,  surgical, family and social history reviewed and updated as indicated. Interim medical history since our last visit reviewed. Allergies and medications reviewed and updated. Outpatient Medications Prior to Visit  Medication Sig Dispense Refill   albuterol  (VENTOLIN  HFA) 108 (90 Base) MCG/ACT inhaler Inhale 2 puffs into the lungs every 6 (six) hours as needed for wheezing or shortness of breath. 8 g 2   Coenzyme Q10 (COQ10 PO) Take 2 each by mouth daily.     diclofenac  Sodium (VOLTAREN ) 1 % GEL Apply topically.     famotidine  (PEPCID ) 20 MG tablet TAKE 1 TABLET BY MOUTH TWICE DAILY FOR HEART 180 tablet 3   losartan  (COZAAR ) 50 MG tablet Take 1 tablet (50 mg total) by mouth daily. 90 tablet 3   Multiple Vitamin (MULTIVITAMIN ADULT PO) Take by mouth.     rosuvastatin  (CRESTOR ) 5 MG tablet Take 1 tablet (5 mg total) by mouth daily. Please keep your upcoming appointment for refills. 90 tablet 0   tirzepatide  (MOUNJARO ) 10 MG/0.5ML Pen Inject 10 mg into the skin once a week. 6 mL 3   triamcinolone  cream (KENALOG ) 0.1 % Apply 1 Application topically 2 (two) times daily. 30 g 0   VITAMIN D  PO Take 5,000 Units by mouth daily.     cyclobenzaprine  (FLEXERIL ) 10 MG tablet Take 1 tablet (10 mg total) by mouth at bedtime  as needed for muscle spasms. 15 tablet 0   No facility-administered medications prior to visit.     Per HPI unless specifically indicated in ROS section below Review of Systems  Constitutional:  Negative for fatigue and fever.  HENT:  Negative for congestion.   Eyes:  Negative for pain.  Respiratory:  Negative for cough and shortness of breath.   Cardiovascular:  Negative for chest pain, palpitations and leg swelling.  Gastrointestinal:  Negative for abdominal pain.  Genitourinary:  Negative for dysuria and vaginal bleeding.  Musculoskeletal:  Negative for back pain.  Neurological:  Negative for syncope, light-headedness and headaches.  Psychiatric/Behavioral:  Negative for dysphoric  mood.    Objective:  BP 110/70   Pulse 74   Temp 98.3 F (36.8 C) (Temporal)   Ht 5' 3.75 (1.619 m)   Wt 189 lb (85.7 kg)   LMP 11/17/2012   SpO2 95%   BMI 32.70 kg/m   Wt Readings from Last 3 Encounters:  05/31/24 189 lb (85.7 kg)  03/22/24 197 lb 9.6 oz (89.6 kg)  02/21/24 199 lb (90.3 kg)      Physical Exam Vitals and nursing note reviewed.  Constitutional:      General: She is not in acute distress.    Appearance: Normal appearance. She is well-developed. She is not ill-appearing or toxic-appearing.  HENT:     Head: Normocephalic.     Right Ear: Hearing, tympanic membrane, ear canal and external ear normal.     Left Ear: Hearing, tympanic membrane, ear canal and external ear normal.     Nose: Nose normal.  Eyes:     General: Lids are normal. Lids are everted, no foreign bodies appreciated.     Conjunctiva/sclera: Conjunctivae normal.     Pupils: Pupils are equal, round, and reactive to light.  Neck:     Thyroid : No thyroid  mass or thyromegaly.     Vascular: No carotid bruit.     Trachea: Trachea normal.  Cardiovascular:     Rate and Rhythm: Normal rate and regular rhythm.     Heart sounds: Normal heart sounds, S1 normal and S2 normal. No murmur heard.    No gallop.  Pulmonary:     Effort: Pulmonary effort is normal. No respiratory distress.     Breath sounds: Normal breath sounds. No wheezing, rhonchi or rales.  Abdominal:     General: Bowel sounds are normal. There is no distension or abdominal bruit.     Palpations: Abdomen is soft. There is no fluid wave or mass.     Tenderness: There is no abdominal tenderness. There is no guarding or rebound.     Hernia: No hernia is present.  Musculoskeletal:     Cervical back: Normal range of motion and neck supple.  Lymphadenopathy:     Cervical: No cervical adenopathy.  Skin:    General: Skin is warm and dry.     Findings: No rash.  Neurological:     Mental Status: She is alert and oriented to person, place, and  time.     Cranial Nerves: No cranial nerve deficit.     Sensory: No sensory deficit.  Psychiatric:        Mood and Affect: Mood is not anxious or depressed.        Speech: Speech normal.        Behavior: Behavior normal. Behavior is cooperative.        Judgment: Judgment normal.  Results for orders placed or performed in visit on 05/31/24  POCT glycosylated hemoglobin (Hb A1C)   Collection Time: 05/31/24 10:49 AM  Result Value Ref Range   Hemoglobin A1C 5.3 4.0 - 5.6 %   HbA1c POC (<> result, manual entry)     HbA1c, POC (prediabetic range)     HbA1c, POC (controlled diabetic range)      This visit occurred during the SARS-CoV-2 public health emergency.  Safety protocols were in place, including screening questions prior to the visit, additional usage of staff PPE, and extensive cleaning of exam room while observing appropriate contact time as indicated for disinfecting solutions.   COVID 19 screen:  No recent travel or known exposure to COVID19 The patient denies respiratory symptoms of COVID 19 at this time. The importance of social distancing was discussed today.   Assessment and Plan Problem List Items Addressed This Visit     Adenosarcoma of body of uterus (HCC)   Hypertension associated with type 2 diabetes mellitus (HCC)   Chronic, well-controlled  Losartan  50 mg daily       Skin lesion of left leg   Chronic skin lesion, most likely hyperpigmentation over cyst at area with previous inflammation. She can try topical steroid cream twice daily to help area fade and decrease inflammation.  No current evidence of infection. She is having some additional skin issues on face and so we will refer her to dermatology for both issues.      Relevant Orders   Ambulatory referral to Dermatology   Thrombocytopenia (HCC)   No longer on prednisone  since the end of April. Due for repeat platelet check.  Followed by Dr. Lonn      Relevant Orders   CBC with  Differential/Platelet   Comprehensive metabolic panel with GFR   Type 2 diabetes mellitus with other circulatory complications (HCC) - Primary   Chronic, significant improvement now off prednisone , with lifestyle change/weight management and higher dose of Mounjaro  Continue current medication.   On  Mounjaro  10 mg weekly.      Relevant Orders   POCT glycosylated hemoglobin (Hb A1C) (Completed)     Meds ordered this encounter  Medications   cyclobenzaprine  (FLEXERIL ) 10 MG tablet    Sig: Take 1 tablet (10 mg total) by mouth at bedtime as needed for muscle spasms.    Dispense:  15 tablet    Refill:  0     Greig Ring, MD

## 2024-05-31 NOTE — Assessment & Plan Note (Signed)
 Chronic, well-controlled  Losartan 50 mg daily

## 2024-05-31 NOTE — Assessment & Plan Note (Addendum)
 Chronic, significant improvement now off prednisone , with lifestyle change/weight management and higher dose of Mounjaro  Continue current medication.   On  Mounjaro  10 mg weekly.

## 2024-05-31 NOTE — Telephone Encounter (Signed)
  CRITICAL VALUE:platelet 48,000  RECEIVER (on-site recipient of call):Kimberleigh Mehan LPN  DATE & TIME NOTIFIED: 4:22 PM 05/31/24   MESSENGER (representative from lab):Karen with [place] [facility]  MD NOTIFIED: Dr. Greig Ring  TIME OF NOTIFICATION:4:22 PM  RESPONSE:

## 2024-05-31 NOTE — Assessment & Plan Note (Signed)
 No longer on prednisone  since the end of April. Due for repeat platelet check.  Followed by Dr. Lonn

## 2024-06-03 ENCOUNTER — Telehealth: Payer: Self-pay | Admitting: *Deleted

## 2024-06-03 NOTE — Telephone Encounter (Signed)
 Per Dr. Lonn, called pt to f/u about recent visit with PCP as well as seeing if an office visit with Dr. Lonn was option. Pt verbalized that she was fine and that her PCP just wanted to make provider aware of her abnormal counts. Declined office viit and if needed pt stated she will f/u.

## 2024-06-03 NOTE — Telephone Encounter (Signed)
-----   Message from [name] sent at 06/03/2024  8:40 AM EDT ----- I cannot remember who covers my desk last week when her physician called for me to see her back Her platelets are not much changed from baseline There is nothing I can do to fix it I can see her next week on 7/25 if she wants to talk about it Either decision she makes please document

## 2024-07-07 ENCOUNTER — Ambulatory Visit
Admission: EM | Admit: 2024-07-07 | Discharge: 2024-07-07 | Disposition: A | Discharge status: Home / Self Care | Attending: Family Medicine | Admitting: Family Medicine

## 2024-07-07 DIAGNOSIS — R040 Epistaxis: Secondary | ICD-10-CM | POA: Diagnosis not present

## 2024-07-07 DIAGNOSIS — D696 Thrombocytopenia, unspecified: Secondary | ICD-10-CM

## 2024-07-07 DIAGNOSIS — H6121 Impacted cerumen, right ear: Secondary | ICD-10-CM

## 2024-07-07 MED ORDER — OXYMETAZOLINE HCL 0.05 % NA SOLN: 1.0000 | Freq: Two times a day (BID) | NASAL | 0 refills | Status: AC

## 2024-07-07 MED ORDER — CARBAMIDE PEROXIDE 6.5 % OT SOLN: 5.0000 [drp] | Freq: Every day | OTIC | 0 refills | Status: AC | PRN

## 2024-07-07 MED ORDER — OXYMETAZOLINE HCL 0.05 % NA SOLN
1.0000 | Freq: Two times a day (BID) | NASAL | Status: DC | PRN
  Administered 2024-07-07: 1 via NASAL

## 2024-07-07 NOTE — Discharge Instructions (Addendum)
 Use Afrin nasal spray tomorrow and thereafter do not use again unless you have another nosebleed.  Contact your ENT specialist tomorrow for an urgent follow-up appointment regarding your nosebleed.  Use carbamide peroxide ear solution to the left ear to try and help dissolve some of the earwax in the left ear.  You can use this daily as needed long-term.

## 2024-07-07 NOTE — ED Provider Notes (Signed)
 [facility]  Note:  This document was prepared using Conservation officer, historic buildings and may include unintentional dictation errors.  MRN: [id number] DOB: [birth year]  Subjective:   Rachel Rivas is a 63 y.o. female presenting for 2 episodes of nosebleeding after sneezing for the past day.  Has had some intermittent nosebleeds in the past thought to be related to ibuprofen  use.  She discontinued this and thereafter her nasal bleeding resolved.  Has not had the issue for a few years.  She has also had sensation of fullness in the left ear.  Has a tube in that ear.  Also feels some fullness in the right ear.  Has a tympanostomy that was done just over a year ago.  Patient is monitored for thrombocytopenia.  Last draw was in July and was estimated to be 46,000.  Patient declined recheck at that time.  Denies any bruising, bleeding gums.  The only episode of bleeding that she had was in the past day from her sneezing.  No current facility-administered medications for this encounter.  Current Outpatient Medications:    albuterol  (VENTOLIN  HFA) 108 (90 Base) MCG/ACT inhaler, Inhale 2 puffs into the lungs every 6 (six) hours as needed for wheezing or shortness of breath., Disp: 8 g, Rfl: 2   Coenzyme Q10 (COQ10 PO), Take 2 each by mouth daily., Disp: , Rfl:    cyclobenzaprine  (FLEXERIL ) 10 MG tablet, Take 1 tablet (10 mg total) by mouth at bedtime as needed for muscle spasms., Disp: 15 tablet, Rfl: 0   diclofenac  Sodium (VOLTAREN ) 1 % GEL, Apply topically., Disp: , Rfl:    famotidine  (PEPCID ) 20 MG tablet, TAKE 1 TABLET BY MOUTH TWICE DAILY FOR HEART, Disp: 180 tablet, Rfl: 3   losartan  (COZAAR ) 50 MG tablet, Take 1 tablet (50 mg total) by mouth daily., Disp: 90 tablet, Rfl: 3   Multiple Vitamin (MULTIVITAMIN ADULT PO), Take by mouth., Disp: , Rfl:    rosuvastatin  (CRESTOR ) 5 MG tablet, Take 1 tablet (5 mg total) by mouth daily. Please keep your upcoming appointment for  refills., Disp: 90 tablet, Rfl: 0   tirzepatide  (MOUNJARO ) 10 MG/0.5ML Pen, Inject 10 mg into the skin once a week., Disp: 6 mL, Rfl: 3   triamcinolone  cream (KENALOG ) 0.1 %, Apply 1 Application topically 2 (two) times daily., Disp: 30 g, Rfl: 0   VITAMIN D  PO, Take 5,000 Units by mouth daily., Disp: , Rfl:    Allergies  Allergen Reactions   Latex Itching, Dermatitis and Rash   Rituximab  Other (See Comments)    Sore Throat - see note from hypersensitivity reaction on 01/12/2024   Sulfa Antibiotics Nausea And Vomiting   Elemental Sulfur Nausea Only    Past Medical History:  Diagnosis Date   Allergy    occasionally   Anemia    yrs ago, low platelets   Arthritis    RA   Back pain    Blood transfusion without reported diagnosis    1985 with c section   Borderline diabetic    Cancer (HCC) 2021   Uterine   Diabetes mellitus without complication (HCC)    Dyspnea    While taking Megace    GERD (gastroesophageal reflux disease)    Hypertension    Joint pain    Obstructive sleep apnea syndrome 01/07/2020   Prediabetes    Rheumatoid arthritis (HCC)    Sciatica    Sleep apnea    uses CPAP   SOB (shortness of breath)  Thrombocytopenia [facility])      Past Surgical History:  Procedure Laterality Date   CESAREAN SECTION     COLONOSCOPY  02/22/2016   Dr.Armbruster   IR IMAGING GUIDED PORT INSERTION  05/22/2020   IR REMOVAL TUN ACCESS W/ PORT W/O FL MOD SED  04/30/2021   MYOMECTOMY N/A 04/07/2020   Procedure: VAGINAL MYOMECTOMY;  Surgeon: Lavoie, Marie-Lyne, MD;  Location: [facility];  Service: Gynecology;  Laterality: N/A;   POLYPECTOMY     ROBOTIC ASSISTED TOTAL HYSTERECTOMY WITH BILATERAL SALPINGO OOPHERECTOMY Bilateral 04/07/2020   Procedure: XI ROBOTIC TOTAL LAPAROSCOPIC HYSTERECTOMY, BILATERAL SALPINGO OOPHORECTOMY;  Surgeon: Lavoie, Marie-Lyne, MD;  Location: [facility];  Service: Gynecology;  Laterality: Bilateral;   STERILIZATION     TUBAL  LIGATION     TYMPANOSTOMY TUBE PLACEMENT Left     Family History  Problem Relation Age of Onset   Lung cancer Mother    Hypertension Mother    Diabetes Mother    Uterine cancer Mother    Cancer Mother        endometrial   Obesity Mother    Hypertension Father    Diabetes Father    Stroke Father    Hyperlipidemia Father    Obesity Father    Colon cancer Neg Hx    Colon polyps Neg Hx    Esophageal cancer Neg Hx    Rectal cancer Neg Hx    Stomach cancer Neg Hx     Social History   Tobacco Use   Smoking status: Former    Current packs/day: 0.25    Average packs/day: 0.3 packs/day for 12.0 years (3.0 ttl pk-yrs)    Types: Cigarettes   Smokeless tobacco: Never  Vaping Use   Vaping status: Never Used  Substance Use Topics   Alcohol use: Yes    Comment: occasional-wine   Drug use: No    Comment: past; marijuana    ROS   Objective:   Vitals: BP (!) 140/72 (BP Location: Left Arm)   Pulse (!) 59   Temp 97.8 F (36.6 C) (Oral)   Resp 18   LMP 11/17/2012   SpO2 98%   Physical Exam Constitutional:      General: She is not in acute distress.    Appearance: Normal appearance. She is well-developed and normal weight. She is not ill-appearing, toxic-appearing or diaphoretic.  HENT:     Head: Normocephalic and atraumatic.     Right Ear: Tympanic membrane, ear canal and external ear normal. No drainage or tenderness. No middle ear effusion. There is no impacted cerumen. Tympanic membrane is not erythematous or bulging.     Left Ear: Ear canal and external ear normal. No drainage or tenderness.  No middle ear effusion. There is impacted cerumen. Tympanic membrane is not erythematous or bulging.     Ears:     Comments: Attempted an ear lavage using hydrogen peroxide, warm water.  Patient tolerated 1 spray before she endorsed severe pain in her chest, fullness into her right ear.  Thereafter she had left nasal bleeding which was controlled with pressure.  1 spray of Afrin  was applied to each nostril.    Nose: No congestion or rhinorrhea.     Mouth/Throat:     Mouth: Mucous membranes are moist. No oral lesions.     Pharynx: No pharyngeal swelling, oropharyngeal exudate, posterior oropharyngeal erythema or uvula swelling.     Tonsils: No tonsillar exudate or tonsillar abscesses.  Eyes:  General: No scleral icterus.       Right eye: No discharge.        Left eye: No discharge.     Extraocular Movements: Extraocular movements intact.     Right eye: Normal extraocular motion.     Left eye: Normal extraocular motion.     Conjunctiva/sclera: Conjunctivae normal.  Cardiovascular:     Rate and Rhythm: Normal rate.  Pulmonary:     Effort: Pulmonary effort is normal.  Musculoskeletal:     Cervical back: Normal range of motion and neck supple.  Lymphadenopathy:     Cervical: No cervical adenopathy.  Skin:    General: Skin is warm and dry.  Neurological:     General: No focal deficit present.     Mental Status: She is alert and oriented to person, place, and time.  Psychiatric:        Mood and Affect: Mood normal.        Behavior: Behavior normal.     Assessment and Plan :   PDMP not reviewed this encounter.  1. Epistaxis   2. Thrombocytopenia (HCC)   3. Impacted cerumen of right ear    Epistaxis controlled in clinic.  Recommended Afrin use at home and urgent follow-up with her ENT specialist.  Offered repeat CBC for patient.  Regarding her cerumen impaction, patient could not tolerate the ear lavage and therefore recommended over-the-counter Debrox.  Counseled patient on potential for adverse effects with medications prescribed/recommended today, ER and return-to-clinic precautions discussed, patient verbalized understanding.    Christopher Savannah, PA-C 07/07/24 1230

## 2024-07-07 NOTE — ED Triage Notes (Signed)
 Pt reports she had 1 episode of nosebleed yesterday after sneezing and today again. Nosebleed stopped after applying ice compress and pressure. Pt concern as she has low platelet count. No active bleeding  at this moment.   Pt feels fluid in the right ear x 1 day. Reports she has a tube in the left ear due to fluid build upo.

## 2024-07-08 ENCOUNTER — Ambulatory Visit (HOSPITAL_COMMUNITY): Payer: Self-pay

## 2024-07-08 LAB — CBC WITH DIFFERENTIAL/PLATELET
Basophils Absolute: 0 x10E3/uL (ref 0.0–0.2)
Basos: 0 %
EOS (ABSOLUTE): 0.2 x10E3/uL (ref 0.0–0.4)
Eos: 3 %
Hematocrit: 41.5 % (ref 34.0–46.6)
Hemoglobin: 13.1 g/dL (ref 11.1–15.9)
Immature Grans (Abs): 0 x10E3/uL (ref 0.0–0.1)
Immature Granulocytes: 0 %
Lymphocytes Absolute: 2.1 x10E3/uL (ref 0.7–3.1)
Lymphs: 37 %
MCH: 27.1 pg (ref 26.6–33.0)
MCHC: 31.6 g/dL (ref 31.5–35.7)
MCV: 86 fL (ref 79–97)
Monocytes Absolute: 0.5 x10E3/uL (ref 0.1–0.9)
Monocytes: 9 %
Neutrophils Absolute: 2.7 x10E3/uL (ref 1.4–7.0)
Neutrophils: 51 %
Platelets: 63 x10E3/uL — CL (ref 150–450)
RBC: 4.83 x10E6/uL (ref 3.77–5.28)
RDW: 14.1 % (ref 11.7–15.4)
WBC: 5.5 x10E3/uL (ref 3.4–10.8)

## 2024-07-16 ENCOUNTER — Ambulatory Visit (INDEPENDENT_AMBULATORY_CARE_PROVIDER_SITE_OTHER): Admitting: Otolaryngology

## 2024-07-16 ENCOUNTER — Encounter (INDEPENDENT_AMBULATORY_CARE_PROVIDER_SITE_OTHER): Payer: Self-pay | Admitting: Otolaryngology

## 2024-07-16 VITALS — BP 155/83 | HR 66

## 2024-07-16 DIAGNOSIS — R040 Epistaxis: Secondary | ICD-10-CM

## 2024-07-16 DIAGNOSIS — H6122 Impacted cerumen, left ear: Secondary | ICD-10-CM | POA: Diagnosis not present

## 2024-07-16 DIAGNOSIS — Z09 Encounter for follow-up examination after completed treatment for conditions other than malignant neoplasm: Secondary | ICD-10-CM

## 2024-07-16 DIAGNOSIS — Z8669 Personal history of other diseases of the nervous system and sense organs: Secondary | ICD-10-CM | POA: Diagnosis not present

## 2024-07-16 DIAGNOSIS — H6982 Other specified disorders of Eustachian tube, left ear: Secondary | ICD-10-CM | POA: Diagnosis not present

## 2024-07-16 DIAGNOSIS — Z9629 Presence of other otological and audiological implants: Secondary | ICD-10-CM | POA: Diagnosis not present

## 2024-07-16 DIAGNOSIS — H7202 Central perforation of tympanic membrane, left ear: Secondary | ICD-10-CM

## 2024-07-17 DIAGNOSIS — H6122 Impacted cerumen, left ear: Secondary | ICD-10-CM | POA: Insufficient documentation

## 2024-07-17 DIAGNOSIS — R040 Epistaxis: Secondary | ICD-10-CM | POA: Insufficient documentation

## 2024-07-17 DIAGNOSIS — H7202 Central perforation of tympanic membrane, left ear: Secondary | ICD-10-CM | POA: Insufficient documentation

## 2024-07-17 NOTE — Progress Notes (Signed)
 Patient ID: Rachel Rivas, female   DOB: [birth year], 63 y.o.   MRN: [id number]  Follow-up: Left eustachian tube dysfunction, left middle ear effusion New complaint: Recurrent left epistaxis, left ear cerumen impaction  HPI: The patient is a 63 year old female who presents today with a new complaint of recurrent left epistaxis.  The patient was previously seen for left ear eustachian tube dysfunction, left middle ear effusion, and left ear hearing loss.  She was treated with left myringotomy and tube placement in March 2024.  According to the patient, she has not had any otologic difficulty since the tube placement.  She started experiencing left-sided epistaxis approximately 2 weeks ago.  She has had 2 episode of severe bleeding.  She denies any recent nasal trauma.  She is not on any blood thinner.  She has a history of thrombocytopenia secondary to her autoimmune disease.  She was recently evaluated at an urgent care center.  He was also noted to have left ear cerumen impaction.  She was referred to ENT for further evaluation and treatment.  She has no previous nasal surgery.  Exam: General: Communicates without difficulty, well nourished, no acute distress. Head: Normocephalic, no evidence injury, no tenderness, facial buttresses intact without stepoff. Face/sinus: No tenderness to palpation and percussion. Facial movement is normal and symmetric. Eyes: PERRL, EOMI. No scleral icterus, conjunctivae clear. Neuro: CN II exam reveals vision grossly intact.  No nystagmus at any point of gaze. Ears: Auricles well formed without lesions.  Left ear cerumen impaction.  The right ear canal and tympanic membrane are normal.  Nose: External evaluation reveals normal support and skin without lesions.  Dorsum is intact.  Anterior rhinoscopy reveals hypervascular areas on the left nasal septum.  Oral:  Oral cavity and oropharynx are intact, symmetric, without erythema or edema.  Mucosa is moist without lesions. Neck:  Full range of motion without pain.  There is no significant lymphadenopathy.  No masses palpable.  Thyroid  bed within normal limits to palpation.  Parotid glands and submandibular glands equal bilaterally without mass.  Trachea is midline. Neuro:  CN 2-12 grossly intact.   Procedure:  Endoscopic control of recurrent left epistaxis. Indication:  Recurrent epistaxis  Description:  The left nasal cavity is sprayed with topical xylocaine  and neo-synephrine.  After adequate anesthesia is achieved, the nasal cavity is examined with a 0 rigid endoscope.  A suction catheter is inserted in parallel with the 0 endoscope, and it is used to suction blood clots from the nasal cavity.  Several hypervascular areas are noted on the anterior and superior portion of the septum. Active bleeding is noted. A silver nitrate stick is inserted in parallel with the 0 endoscope.  It is used to repeatedly cauterized the hypervascular areas.  Good hemostasis is achieved.  The patient tolerated the procedure well.    Procedure: Left ear cerumen disimpaction Anesthesia: None Description: Under the operating microscope, the cerumen is carefully removed with a combination of cerumen currette, alligator forceps, and suction catheters.  After the cerumen is removed, the left ventilating tube is noted to be in place and patent.  No mass, erythema, or lesions. The patient tolerated the procedure well.    Assessment: 1.  Left ear cerumen impaction.  After the disimpaction procedure, the left ear ventilating tube was noted to be in place and patent.  No drainage is noted. 2.  Left ear eustachian tube dysfunction. 3.  Recurrent left epistaxis.  Hypervascular areas are noted on the left anterior and superior nasal  septum.  Plan: 1.  Otomicroscopy with left ear cerumen disimpaction. 2.  The physical exam findings are reviewed with the patient. 3.  Continue dry ear precautions on the left side. 4.  Endoscopic cauterization of the left  nasal septum. 5.  Nasal ointment and humidifier as needed. 6.  The patient will return for reevaluation in 1 month.

## 2024-07-18 DIAGNOSIS — G4733 Obstructive sleep apnea (adult) (pediatric): Secondary | ICD-10-CM | POA: Diagnosis not present

## 2024-07-18 DIAGNOSIS — I10 Essential (primary) hypertension: Secondary | ICD-10-CM | POA: Diagnosis not present

## 2024-08-02 DIAGNOSIS — E119 Type 2 diabetes mellitus without complications: Secondary | ICD-10-CM | POA: Diagnosis not present

## 2024-08-02 DIAGNOSIS — H2513 Age-related nuclear cataract, bilateral: Secondary | ICD-10-CM | POA: Diagnosis not present

## 2024-08-13 ENCOUNTER — Encounter: Payer: Self-pay | Admitting: Family Medicine

## 2024-08-14 ENCOUNTER — Telehealth: Payer: Self-pay

## 2024-08-14 ENCOUNTER — Other Ambulatory Visit (HOSPITAL_COMMUNITY): Payer: Self-pay

## 2024-08-14 MED ORDER — ALBUTEROL SULFATE HFA 108 (90 BASE) MCG/ACT IN AERS: 2.0000 | INHALATION_SPRAY | Freq: Four times a day (QID) | RESPIRATORY_TRACT | 2 refills | Status: AC | PRN

## 2024-08-14 NOTE — Telephone Encounter (Signed)
 Handicap Placard Application placed in Dr. Sherrel office in box to complete.  Please sign order for Diclofenac  if okay to refill. Albuterol  inhaler already sent.

## 2024-08-14 NOTE — Telephone Encounter (Signed)
 Pharmacy Patient Advocate Encounter   Received notification from Onbase that prior authorization for Ventolin  HFA 108 is required/requested.   Insurance verification completed.   The patient is insured through [employer] .   Per test claim: PA required; PA submitted to above mentioned insurance via Latent Key/confirmation #/EOC [id number] Status is pending

## 2024-08-15 ENCOUNTER — Other Ambulatory Visit (HOSPITAL_COMMUNITY): Payer: Self-pay

## 2024-08-15 MED ORDER — DICLOFENAC SODIUM 1 % EX GEL: 2.0000 g | Freq: Four times a day (QID) | CUTANEOUS | 1 refills | Status: AC

## 2024-08-15 NOTE — Telephone Encounter (Signed)
 Pharmacy Patient Advocate Encounter  Received notification from [employer] that Prior Authorization for Ventolin  HFA has been APPROVED from 08/15/24 to 08/15/25. Unable to obtain price due to refill too soon rejection, last fill date 08/14/24 next available fill date10/15/25   PA #/Case ID/Reference #: # [id number]

## 2024-08-16 NOTE — Telephone Encounter (Signed)
Pt came by and picked up form

## 2024-08-21 ENCOUNTER — Encounter (INDEPENDENT_AMBULATORY_CARE_PROVIDER_SITE_OTHER): Payer: Self-pay | Admitting: Otolaryngology

## 2024-08-21 ENCOUNTER — Ambulatory Visit (INDEPENDENT_AMBULATORY_CARE_PROVIDER_SITE_OTHER): Admitting: Otolaryngology

## 2024-08-21 VITALS — BP 143/80 | HR 80 | Temp 98.2°F

## 2024-08-21 DIAGNOSIS — Z8669 Personal history of other diseases of the nervous system and sense organs: Secondary | ICD-10-CM | POA: Diagnosis not present

## 2024-08-21 DIAGNOSIS — H6982 Other specified disorders of Eustachian tube, left ear: Secondary | ICD-10-CM

## 2024-08-21 DIAGNOSIS — Z09 Encounter for follow-up examination after completed treatment for conditions other than malignant neoplasm: Secondary | ICD-10-CM | POA: Diagnosis not present

## 2024-08-21 DIAGNOSIS — H7202 Central perforation of tympanic membrane, left ear: Secondary | ICD-10-CM

## 2024-08-21 DIAGNOSIS — Z9629 Presence of other otological and audiological implants: Secondary | ICD-10-CM | POA: Diagnosis not present

## 2024-08-21 DIAGNOSIS — R040 Epistaxis: Secondary | ICD-10-CM

## 2024-08-22 NOTE — Progress Notes (Signed)
 Patient ID: Rachel Rivas, female   DOB: [birth year], 63 y.o.   MRN: [id number]  Follow-up: Recurrent left epistaxis, left eustachian tube dysfunction  HPI: The patient is a 63 year old female who returns today for follow-up evaluation.  She was last seen in August 2025.  At that time, she was complaining of recurrent left epistaxis.  She was treated with endoscopic cauterization of her left nasal septum.  She also has a history of left eustachian tube dysfunction and left middle ear effusion.  She was treated with left myringotomy and tube placement in March 2024.  The patient returns today reporting no otologic or nasal difficulty since her last visit.  She has not had any bleeding episode.  Exam: General: Communicates without difficulty, well nourished, no acute distress. Head: Normocephalic, no evidence injury, no tenderness, facial buttresses intact without stepoff. Face/sinus: No tenderness to palpation and percussion. Facial movement is normal and symmetric. Eyes: PERRL, EOMI. No scleral icterus, conjunctivae clear. Neuro: CN II exam reveals vision grossly intact.  No nystagmus at any point of gaze. Ears: Auricles well formed without lesions.  The left tube is in place and patent.  The right tympanic membrane is intact.  No drainage is noted today.  Nose: External evaluation reveals normal support and skin without lesions.  Dorsum is intact.  Anterior rhinoscopy reveals the left nasal septum to be well-healed.  No bleeding or hypervascular area is noted today.  Oral:  Oral cavity and oropharynx are intact, symmetric, without erythema or edema.  Mucosa is moist without lesions. Neck: Full range of motion without pain.  There is no significant lymphadenopathy.  No masses palpable.  Thyroid  bed within normal limits to palpation.  Parotid glands and submandibular glands equal bilaterally without mass.  Trachea is midline. Neuro:  CN 2-12 grossly intact.  Assessment: 1.  The left tube is in place and  patent.  No drainage is noted. 2.  The patient's left nasal septum is well-healed.  No bleeding or hypervascular areas noted today. 3.  Left ear eustachian tube dysfunction.  Plan: 1.  The physical exam findings are reviewed with the patient. 2.  Nasal ointment and humidifier as needed. 3.  Continue dry ear precautions on the left side. 4.  The patient will return for reevaluation in 6 months, with repeat hearing test.

## 2024-09-17 ENCOUNTER — Other Ambulatory Visit: Payer: Self-pay | Admitting: Hematology and Oncology

## 2024-09-17 DIAGNOSIS — C549 Malignant neoplasm of corpus uteri, unspecified: Secondary | ICD-10-CM

## 2024-09-30 ENCOUNTER — Telehealth: Payer: Self-pay

## 2024-09-30 NOTE — Telephone Encounter (Signed)
 Called and left a message asking her to call the office regarding 1/23 appt. It needs to be moved to after 9 am.

## 2024-10-03 ENCOUNTER — Telehealth: Payer: Self-pay

## 2024-10-03 NOTE — Telephone Encounter (Signed)
 Called to give new appt time with Dr. Lonn on 1/23 at 9 am. Given radiology scheduling # to call to schedule January CT. She verbalized understanding.

## 2024-10-07 ENCOUNTER — Telehealth: Payer: Self-pay | Admitting: Cardiology

## 2024-10-07 NOTE — Telephone Encounter (Signed)
 Returned call to patient.  She has been SOB since she got Covid in 2020.  When she lays down on rigth side her breath is short. It is concerning her.  Randomly her heart flutters and makes her breath catch.  Sometimes when she walks her breath gets short.  While talking on the phone she feel in my chest like my heart wants to flutter.  Asked about hydration.  Pt thinks she drinks about 2-3 bottles per day.  No caffeine or energy drinks.  Denies chest pain or swelling.  She is requesting sooner appointment.  Scheduled w Dr. Lonni Friday 11/21 at 2:40 pm

## 2024-10-07 NOTE — Telephone Encounter (Signed)
 Pt c/o Shortness Of Breath: STAT if SOB developed within the last 24 hours or pt is noticeably SOB on the phone  1. Are you currently SOB (can you hear that pt is SOB on the phone)? No   2. How long have you been experiencing SOB? 2-3 weeks  3. Are you SOB when sitting or when up moving around? Laying down and sitting.   4. Are you currently experiencing any other symptoms? DWB   Pt requesting earlier appt.

## 2024-10-11 ENCOUNTER — Encounter (HOSPITAL_BASED_OUTPATIENT_CLINIC_OR_DEPARTMENT_OTHER): Payer: Self-pay | Admitting: Cardiology

## 2024-10-11 ENCOUNTER — Ambulatory Visit (HOSPITAL_BASED_OUTPATIENT_CLINIC_OR_DEPARTMENT_OTHER): Admitting: Cardiology

## 2024-10-11 VITALS — BP 128/78 | HR 60 | Ht 63.75 in | Wt 186.6 lb

## 2024-10-11 DIAGNOSIS — R0602 Shortness of breath: Secondary | ICD-10-CM

## 2024-10-11 DIAGNOSIS — E7800 Pure hypercholesterolemia, unspecified: Secondary | ICD-10-CM

## 2024-10-11 DIAGNOSIS — I10 Essential (primary) hypertension: Secondary | ICD-10-CM

## 2024-10-11 DIAGNOSIS — E119 Type 2 diabetes mellitus without complications: Secondary | ICD-10-CM

## 2024-10-11 DIAGNOSIS — I2510 Atherosclerotic heart disease of native coronary artery without angina pectoris: Secondary | ICD-10-CM | POA: Diagnosis not present

## 2024-10-11 DIAGNOSIS — Z7189 Other specified counseling: Secondary | ICD-10-CM

## 2024-10-11 MED ORDER — ROSUVASTATIN CALCIUM 5 MG PO TABS: 5.0000 mg | ORAL_TABLET | Freq: Every day | ORAL | 3 refills | Status: AC

## 2024-10-11 NOTE — Patient Instructions (Addendum)
 Medication Instructions:  Your physician recommends that you continue on your current medications as directed. Please refer to the Current Medication list given to you today.   Labwork: NONE  Testing/Procedures: NONE  Follow-Up: AS NEEDED   Any Other Special Instructions Will Be Listed Below (If Applicable).  Look into [facility] to check your palpitations.

## 2024-10-11 NOTE — Progress Notes (Signed)
 Cardiology Office Note:  .   Date:  10/11/2024  ID:  Rachel Rivas, DOB 1961/09/23, MRN [id number] PCP: Rachel Greig BRAVO, MD  [facility] Providers Cardiologist:  Shelda Bruckner, MD {  History of Present Illness: .   Rachel Rivas is a 63 y.o. female with a hx of nonobstructive CAD based on calcium  score, hypertension, rheumatoid arthritis, type II diabetes, hyperlipidemia, uterine cancer who is seen for follow up today. I initially met her 11/05/2019 as a new consult at the request of Bedsole, Amy E, MD for the evaluation and management of shortness of breath.   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 Did six rounds of adjuvant chemo (carboplatin  and taxol ), has been exhausted since then. Has been short of breath since Covid infection. CT chest 2021 with coronary calcium , no score available.  Today: Overall doing well. Weight is down, starting weight 214 lbs to 181 lbs, A1c improved. Cholesterol improved, she was taken off her rosuvastatin . Tolerating mounjaro .   We reviewed her prior CT that showed coronary calcification, discussed recommendations for this. Also discussed recommendations re: statin and diabetes.  Her chronic shortness of breath since Covid is unchanged. Doesn't climb stairs routinely, but she can do it if needed, more limited by her back than her breathing. Walks from her apartment to the dumpster regularly, but no routine physical activity. Walks from one side of the building to the other routinely at her job.  Has occasional palpitations, brief, nonlimiting. Reviewed signs/symptoms that would be high risk and need further investigation. Discussed Kardia mobile.  ROS: Denies chest pain. No PND, orthopnea, LE edema or unexpected weight gain. No syncope. ROS otherwise negative except as noted.    Studies Reviewed: SABRA    EKG:  EKG  Interpretation Date/Time:  Friday October 11 2024 14:55:11 EST Ventricular Rate:  60 PR Interval:  160 QRS Duration:  90 QT Interval:  384 QTC Calculation: 384 R Axis:   41  Text Interpretation: Normal sinus rhythm Normal ECG Confirmed by Bruckner Shelda [phone]) on 10/11/2024 2:58:58 PM    Physical Exam:   VS:  BP 128/78   Pulse 60   Ht 5' 3.75 (1.619 m)   Wt 186 lb 9.6 oz (84.6 kg)   LMP 11/17/2012   SpO2 98%   BMI 32.28 kg/m    Wt Readings from Last 3 Encounters:  10/11/24 186 lb 9.6 oz (84.6 kg)  05/31/24 189 lb (85.7 kg)  03/22/24 197 lb 9.6 oz (89.6 kg)    GEN: Well nourished, well developed in no acute distress HEENT: Normal, moist mucous membranes NECK: No JVD CARDIAC: regular rhythm, normal S1 and S2, no rubs or gallops. No murmur. VASCULAR: Radial and DP pulses 2+ bilaterally. No carotid bruits RESPIRATORY:  Clear to auscultation without rales, wheezing or rhonchi  ABDOMEN: Soft, non-tender, non-distended MUSCULOSKELETAL:  Ambulates independently SKIN: Warm and dry, no edema NEUROLOGIC:  Alert and oriented x 3. No focal neuro deficits noted. PSYCHIATRIC:  Normal affect    ASSESSMENT AND PLAN: .    Shortness of breath: since Covid -stable, we previously discussed echo but she elected not to pursue -encouraged her to gradually increase walking and activity -she will contact me if symptoms significant worsen   Hypertension:  -continue losartan  50 mg daily   Coronary artery calcification seen 2021, consistent with nonobstructive CAD Hypercholesterolemia Type II diabetes -was told to stop her rosuvastatin  because her cholesterol  was good--on review, it appears it was more concern for intolerance, there was discussion about changing to pravastatin instead -she was concerned that her cramps and aches were from the statin, but her symptoms didn't improve from stopping statin. She is working with a team to help her back, which is helping her cramps and  aches -we discussed recommendations for statin with diabetes and coronary calcification -last LDL 73 02/09/24, TG 157 (unclear if fasting, previously well controlled). LDL goal <55 given diabetes -agree with GLP1RA with nonobstructive CAD and diabetes.  Palpitations -no high risk features -we discussed how stress, anxiety, adrenaline can amplify symptoms -reviewed options like Kardia mobile to monitor -reviewed red flag/high risk symptoms that need medical attention  CV risk counseling and prevention -recommend heart healthy/Mediterranean diet, with whole grains, fruits, vegetable, fish, lean meats, nuts, and olive oil. Limit salt. -recommend moderate walking, 3-5 times/week for 30-50 minutes each session. Aim for at least 150 minutes/week. Goal should be pace of 3 miles/hours, or walking 1.5 miles in 30 minutes -recommend avoidance of tobacco products. Avoid excess alcohol.  Dispo: She is overall doing well. We will graduate her from routine cardiology follow up, but would be happy to see her back as needed. She can have her rosuvastatin  filled through her PCP in the future, given 1 year prescription today  Signed, Shelda Bruckner, MD   Shelda Bruckner, MD, PhD, [facility]  [facility]  [place]  [facility] at [facility] at [facility] [address], [address] [place], [place] [address] [phone]

## 2024-11-22 ENCOUNTER — Encounter (HOSPITAL_BASED_OUTPATIENT_CLINIC_OR_DEPARTMENT_OTHER): Payer: Self-pay | Admitting: Pulmonary Disease

## 2024-11-22 ENCOUNTER — Ambulatory Visit (INDEPENDENT_AMBULATORY_CARE_PROVIDER_SITE_OTHER): Payer: Medicare (Managed Care) | Admitting: Pulmonary Disease

## 2024-11-22 VITALS — BP 145/85 | HR 61 | Ht 63.0 in | Wt 188.0 lb

## 2024-11-22 DIAGNOSIS — I10 Essential (primary) hypertension: Secondary | ICD-10-CM

## 2024-11-22 DIAGNOSIS — G4733 Obstructive sleep apnea (adult) (pediatric): Secondary | ICD-10-CM | POA: Diagnosis not present

## 2024-11-22 NOTE — Progress Notes (Signed)
" ° °  Subjective:    Patient ID: Rachel Rivas, female    DOB: 11-22-1960, 64 y.o.   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  History of Present Illness Rachel Rivas is a 64 year old female with obstructive sleep apnea who presents for a two-year follow-up.  She uses CPAP every night with an auto setting of 5 to 15 cm H2O and an average pressure of 14.8 cm H2O. Her current apnea index is 0.5 events per hour, improved from about 20 events per hour on a sleep study 4 to 5 years ago.  She has lost weight from 205 to 183 pounds on Mounjaro , which she tolerates without side effects. Her weight ranges from 181 to 183 pounds.  She feels well rested, has no sleep complaints, and denies abdominal pain or vomiting.     Significant tests/ events reviewed   HST 03/2020  mod OSA with AHI 24/ hr  HST 01/2015 - wt 197 pounds -AHI 20/hour    Review of Systems  neg for any significant sore throat, dysphagia, itching, sneezing, nasal congestion or excess/ purulent secretions, fever, chills, sweats, unintended wt loss, pleuritic or exertional cp, hempoptysis, orthopnea pnd or change in chronic leg swelling. Also denies presyncope, palpitations, heartburn, abdominal pain, nausea, vomiting, diarrhea or change in bowel or urinary habits, dysuria,hematuria, rash, arthralgias, visual complaints, headache, numbness weakness or ataxia.      Objective:   Physical Exam  Gen. Pleasant, obese, in no distress ENT - no lesions, no post nasal drip Neck: No JVD, no thyromegaly, no carotid bruits Lungs: no use of accessory muscles, no dullness to percussion, decreased without rales or rhonchi  Cardiovascular: Rhythm regular, heart  sounds  normal, no murmurs or gallops, no peripheral edema Musculoskeletal: No deformities, no cyanosis or clubbing , no tremors       Assessment & Plan:   Assessment and Plan Assessment & Plan Obstructive sleep apnea Well-controlled with CPAP therapy. Current settings are auto-adjusting between 5 to 15 cm H2O, with an average pressure of 14.8 cm H2O. Excellent compliance with no missed nights. Current apnea-hypopnea index (AHI) is 0.5, indicating excellent control. Significant weight loss from 205 to 183 pounds may have contributed to improved control. Previous AHI was 20 events per hour, indicating moderate severity. Weight loss may have reduced the severity of OSA, but a repeat sleep study is needed to confirm. If AHI is less than 5, CPAP may be discontinued. If AHI is between 5 to 10, the decision to continue CPAP will be based on symptoms and cardiac risk. If AHI is 10 to 15, CPAP should be continued due to cardiac risk. - Ordered repeat home sleep study to assess current severity of OSA. - Continue CPAP therapy until results of the sleep study are available. - Will advise based on sleep study results: discontinue CPAP if AHI is less than 5, consider discontinuation if AHI is 5 to 10, and continue CPAP if AHI is 10 to 15.   Hypertension - recheck ,follow with PCP   "

## 2024-11-22 NOTE — Patient Instructions (Signed)
" °  VISIT SUMMARY: Rachel Rivas, you had a follow-up appointment today to review your obstructive sleep apnea management. You have been using your CPAP machine every night with excellent compliance and have experienced significant improvement in your condition. Additionally, you have lost weight, which may have contributed to better control of your sleep apnea.  YOUR PLAN: -OBSTRUCTIVE SLEEP APNEA: Obstructive sleep apnea is a condition where your airway becomes blocked during sleep, causing breathing pauses. Your condition is well-controlled with your CPAP therapy, which you use every night. Your current settings are auto-adjusting between 5 to 15 cm H2O, with an average pressure of 14.8 cm H2O. Your apnea-hypopnea index (AHI) is now 0.5 events per hour, which is excellent. We have ordered a repeat home sleep study to assess the current severity of your sleep apnea. Depending on the results, we may adjust or discontinue your CPAP therapy. Continue using your CPAP until we have the sleep study results.  INSTRUCTIONS: Please complete the repeat home sleep study as ordered. Continue using your CPAP therapy every night until we review the results of the sleep study. We will advise you on the next steps based on the study results.                      Contains text generated by [employer].                                 Contains text generated by [employer]bridge.   "

## 2024-12-06 ENCOUNTER — Inpatient Hospital Stay: Payer: Medicare (Managed Care) | Attending: Hematology and Oncology

## 2024-12-06 ENCOUNTER — Other Ambulatory Visit (HOSPITAL_COMMUNITY): Payer: Self-pay

## 2024-12-06 ENCOUNTER — Telehealth: Payer: Self-pay

## 2024-12-06 ENCOUNTER — Other Ambulatory Visit: Payer: Self-pay | Admitting: Family Medicine

## 2024-12-06 DIAGNOSIS — Z8542 Personal history of malignant neoplasm of other parts of uterus: Secondary | ICD-10-CM | POA: Insufficient documentation

## 2024-12-06 DIAGNOSIS — Z90722 Acquired absence of ovaries, bilateral: Secondary | ICD-10-CM | POA: Diagnosis not present

## 2024-12-06 DIAGNOSIS — Z9079 Acquired absence of other genital organ(s): Secondary | ICD-10-CM | POA: Insufficient documentation

## 2024-12-06 DIAGNOSIS — E1159 Type 2 diabetes mellitus with other circulatory complications: Secondary | ICD-10-CM

## 2024-12-06 DIAGNOSIS — D696 Thrombocytopenia, unspecified: Secondary | ICD-10-CM | POA: Insufficient documentation

## 2024-12-06 DIAGNOSIS — Z90710 Acquired absence of both cervix and uterus: Secondary | ICD-10-CM | POA: Insufficient documentation

## 2024-12-06 DIAGNOSIS — C549 Malignant neoplasm of corpus uteri, unspecified: Secondary | ICD-10-CM

## 2024-12-06 LAB — COMPREHENSIVE METABOLIC PANEL WITH GFR
ALT: 24 U/L (ref 0–44)
AST: 22 U/L (ref 15–41)
Albumin: 4.6 g/dL (ref 3.5–5.0)
Alkaline Phosphatase: 103 U/L (ref 38–126)
Anion gap: 9 (ref 5–15)
BUN: 18 mg/dL (ref 8–23)
CO2: 28 mmol/L (ref 22–32)
Calcium: 9.8 mg/dL (ref 8.9–10.3)
Chloride: 106 mmol/L (ref 98–111)
Creatinine, Ser: 0.93 mg/dL (ref 0.44–1.00)
GFR, Estimated: >60 mL/min
Glucose, Bld: 93 mg/dL (ref 70–99)
Potassium: 4.1 mmol/L (ref 3.5–5.1)
Sodium: 142 mmol/L (ref 135–145)
Total Bilirubin: 0.7 mg/dL (ref 0.0–1.2)
Total Protein: 7.6 g/dL (ref 6.5–8.1)

## 2024-12-06 LAB — CBC WITH DIFFERENTIAL/PLATELET
Abs Immature Granulocytes: 0.01 K/uL (ref 0.00–0.07)
Basophils Absolute: 0 K/uL (ref 0.0–0.1)
Basophils Relative: 0 %
Eosinophils Absolute: 0.1 K/uL (ref 0.0–0.5)
Eosinophils Relative: 2 %
HCT: 38.5 % (ref 36.0–46.0)
Hemoglobin: 12.4 g/dL (ref 12.0–15.0)
Immature Granulocytes: 0 %
Lymphocytes Relative: 34 %
Lymphs Abs: 1.8 K/uL (ref 0.7–4.0)
MCH: 27.1 pg (ref 26.0–34.0)
MCHC: 32.2 g/dL (ref 30.0–36.0)
MCV: 84.1 fL (ref 80.0–100.0)
Monocytes Absolute: 0.7 K/uL (ref 0.1–1.0)
Monocytes Relative: 14 %
Neutro Abs: 2.6 K/uL (ref 1.7–7.7)
Neutrophils Relative %: 50 %
Platelets: 47 K/uL — ABNORMAL LOW (ref 150–400)
RBC: 4.58 MIL/uL (ref 3.87–5.11)
RDW: 14.8 % (ref 11.5–15.5)
WBC: 5.2 K/uL (ref 4.0–10.5)
nRBC: 0 % (ref 0.0–0.2)

## 2024-12-06 NOTE — Telephone Encounter (Signed)
 Pharmacy Patient Advocate Encounter   Received notification from [facility] that prior authorization for Mounjaro  10 is required/requested.   Insurance verification completed.   The patient is insured through [employer].   Per test claim: The current 84 day co-pay is, $436.00.  No PA needed at this time. This test claim was processed through [facility]- copay amounts may vary at other pharmacies due to pharmacy/plan contracts, or as the patient moves through the different stages of their insurance plan.    The 28 day copay is $342.00

## 2024-12-09 ENCOUNTER — Ambulatory Visit (HOSPITAL_COMMUNITY): Payer: Medicare (Managed Care)

## 2024-12-09 ENCOUNTER — Telehealth: Payer: Self-pay

## 2024-12-09 NOTE — Telephone Encounter (Signed)
 Returned her call. She canceled CT for today due to PA approved with old insurance and not her new insurance. Left her a message to call the office back and appt with Dr. Lonn canceled for 1/23.

## 2024-12-09 NOTE — Telephone Encounter (Signed)
 Called her back and told her PA is approved with [employer] for CT.  She will call radiology scheduling to reschedule CT.

## 2024-12-10 ENCOUNTER — Telehealth: Payer: Self-pay

## 2024-12-10 NOTE — Telephone Encounter (Signed)
 Called and offered appt on 2/2 with Dr. Lonn. Scheduled on 2/5 per her request. She is aware of appt.

## 2024-12-11 ENCOUNTER — Other Ambulatory Visit (HOSPITAL_COMMUNITY): Payer: Self-pay

## 2024-12-12 ENCOUNTER — Ambulatory Visit (HOSPITAL_COMMUNITY)
Admission: RE | Admit: 2024-12-12 | Discharge: 2024-12-12 | Disposition: A | Discharge status: Home / Self Care | Payer: Medicare (Managed Care) | Source: Ambulatory Visit | Attending: Hematology and Oncology | Admitting: Hematology and Oncology

## 2024-12-12 DIAGNOSIS — C549 Malignant neoplasm of corpus uteri, unspecified: Secondary | ICD-10-CM | POA: Insufficient documentation

## 2024-12-12 MED ORDER — IOHEXOL 300 MG/ML  SOLN
100.0000 mL | Freq: Once | INTRAMUSCULAR | Status: AC | PRN
  Administered 2024-12-12: 100 mL via INTRAVENOUS

## 2024-12-13 ENCOUNTER — Ambulatory Visit: Admitting: Hematology and Oncology

## 2024-12-13 ENCOUNTER — Inpatient Hospital Stay: Admitting: Hematology and Oncology

## 2024-12-26 ENCOUNTER — Inpatient Hospital Stay: Payer: Medicare (Managed Care) | Admitting: Hematology and Oncology

## 2024-12-26 ENCOUNTER — Encounter: Payer: Self-pay | Admitting: Hematology and Oncology

## 2024-12-26 VITALS — BP 140/72 | HR 79 | Temp 97.7°F | Resp 18 | Ht 63.0 in | Wt 184.0 lb

## 2024-12-26 DIAGNOSIS — C549 Malignant neoplasm of corpus uteri, unspecified: Secondary | ICD-10-CM

## 2024-12-26 DIAGNOSIS — D696 Thrombocytopenia, unspecified: Secondary | ICD-10-CM

## 2024-12-26 NOTE — Progress Notes (Signed)
 [facility] OFFICE PROGRESS NOTE  Patient Care Team: Avelina Greig BRAVO, MD as PCP - General (Family Medicine) Lonni Slain, MD as PCP - Cardiology (Cardiology)  Assessment & Plan Adenosarcoma of body of uterus [facility]) She was originally diagnosed in April 2021 after presentation with postmenopausal bleeding and subsequently underwent biopsy and hysterectomy Final pathology: Adenosarcoma of the endometrium She received adjuvant chemotherapy, completed by October 2021  She is not symptomatic Most recent CT imaging from January 2026 showed no evidence of cancer recurrence She is almost 5 years out from her diagnosis I do not need to see her long-term for this Will continue follow-up with GYN oncologist for pelvic exam Thrombocytopenia The patient has minimal response to prednisone  She has no response to rituximab  We discussed risk and benefits of pursuing further treatment Currently, she is not symptomatic The threshold for treatment is with platelet count less than 50,000 Her platelet count from January was less than 50,000 If she were to pursue additional therapy, I will recommend bone marrow biopsy followed by either Nplate or Promacta assuming bone marrow biopsy is negative The patient would like to think about it If I do not hear back from her, I will assume she wants surveillance/observation and I will see her back in January next year  No orders of the defined types were placed in this encounter.    Rachel Bedford, MD  INTERVAL HISTORY: she returns for surveillance follow-up for chronic ITP and history of uterine cancer She is doing well and she has lost a lot of weight due to weight loss medicine She denies recent bleeding I reviewed blood work and CT imaging with the patient and we discussed future follow-up  PHYSICAL EXAMINATION: ECOG PERFORMANCE STATUS: 0 - Asymptomatic  Vitals:   12/26/24 1304  BP: (!) 140/72  Pulse: 79  Resp: 18  Temp: 97.7 F  (36.5 C)  SpO2: 100%   Filed Weights   12/26/24 1304  Weight: 184 lb (83.5 kg)    Relevant data reviewed during this visit included CBC, CMP, CT imaging

## 2024-12-26 NOTE — Assessment & Plan Note (Addendum)
 She was originally diagnosed in April 2021 after presentation with postmenopausal bleeding and subsequently underwent biopsy and hysterectomy Final pathology: Adenosarcoma of the endometrium She received adjuvant chemotherapy, completed by October 2021  She is not symptomatic Most recent CT imaging from January 2026 showed no evidence of cancer recurrence She is almost 5 years out from her diagnosis I do not need to see her long-term for this Will continue follow-up with GYN oncologist for pelvic exam

## 2024-12-26 NOTE — Assessment & Plan Note (Addendum)
 The patient has minimal response to prednisone  She has no response to rituximab  We discussed risk and benefits of pursuing further treatment Currently, she is not symptomatic The threshold for treatment is with platelet count less than 50,000 Her platelet count from January was less than 50,000 If she were to pursue additional therapy, I will recommend bone marrow biopsy followed by either Nplate or Promacta assuming bone marrow biopsy is negative The patient would like to think about it If I do not hear back from her, I will assume she wants surveillance/observation and I will see her back in January next year

## 2024-12-27 ENCOUNTER — Encounter: Payer: Medicare (Managed Care) | Admitting: Family Medicine

## 2024-12-27 ENCOUNTER — Ambulatory Visit (HOSPITAL_BASED_OUTPATIENT_CLINIC_OR_DEPARTMENT_OTHER): Admitting: Cardiology

## 2025-01-03 ENCOUNTER — Encounter: Admitting: Family Medicine

## 2025-01-22 ENCOUNTER — Inpatient Hospital Stay: Admitting: Obstetrics & Gynecology

## 2025-02-14 ENCOUNTER — Ambulatory Visit (INDEPENDENT_AMBULATORY_CARE_PROVIDER_SITE_OTHER): Admitting: Otolaryngology

## 2025-02-14 ENCOUNTER — Ambulatory Visit (INDEPENDENT_AMBULATORY_CARE_PROVIDER_SITE_OTHER): Admitting: Audiology

## 2025-02-19 ENCOUNTER — Other Ambulatory Visit

## 2025-02-26 ENCOUNTER — Encounter: Admitting: Family

## 2025-02-26 ENCOUNTER — Encounter: Admitting: Family Medicine

## 2025-12-26 ENCOUNTER — Inpatient Hospital Stay: Admitting: Hematology and Oncology

## 2025-12-26 ENCOUNTER — Inpatient Hospital Stay
